# Patient Record
Sex: Male | Born: 1950 | Race: White | Hispanic: No | Marital: Married | Smoking: Former smoker
Health system: Southern US, Community
[De-identification: ages and names within clinical notes are randomized; demographics above are authoritative.]

## PROBLEM LIST (undated history)

## (undated) DIAGNOSIS — E1121 Type 2 diabetes mellitus with diabetic nephropathy: Secondary | ICD-10-CM

## (undated) DIAGNOSIS — M51369 Other intervertebral disc degeneration, lumbar region without mention of lumbar back pain or lower extremity pain: Secondary | ICD-10-CM

## (undated) DIAGNOSIS — R809 Proteinuria, unspecified: Secondary | ICD-10-CM

## (undated) DIAGNOSIS — M5136 Other intervertebral disc degeneration, lumbar region: Secondary | ICD-10-CM

## (undated) DIAGNOSIS — M720 Palmar fascial fibromatosis [Dupuytren]: Secondary | ICD-10-CM

## (undated) DIAGNOSIS — K219 Gastro-esophageal reflux disease without esophagitis: Secondary | ICD-10-CM

## (undated) DIAGNOSIS — E1165 Type 2 diabetes mellitus with hyperglycemia: Secondary | ICD-10-CM

## (undated) DIAGNOSIS — I10 Essential (primary) hypertension: Secondary | ICD-10-CM

## (undated) DIAGNOSIS — E785 Hyperlipidemia, unspecified: Secondary | ICD-10-CM

## (undated) DIAGNOSIS — G4730 Sleep apnea, unspecified: Secondary | ICD-10-CM

## (undated) DIAGNOSIS — J449 Chronic obstructive pulmonary disease, unspecified: Secondary | ICD-10-CM

## (undated) DIAGNOSIS — E669 Obesity, unspecified: Secondary | ICD-10-CM

## (undated) HISTORY — DX: Sleep apnea, unspecified: G47.30

## (undated) HISTORY — DX: Type 2 diabetes mellitus with hyperglycemia: E11.65

## (undated) HISTORY — DX: Hyperlipidemia, unspecified: E78.5

## (undated) HISTORY — DX: Proteinuria, unspecified: R80.9

## (undated) HISTORY — DX: Type 2 diabetes mellitus with diabetic nephropathy: E11.21

## (undated) HISTORY — PX: BACK SURGERY: SHX140

## (undated) HISTORY — PX: NASAL SEPTUM SURGERY: SHX37

## (undated) HISTORY — DX: Gastro-esophageal reflux disease without esophagitis: K21.9

## (undated) HISTORY — DX: Obesity, unspecified: E66.9

## (undated) HISTORY — DX: Essential (primary) hypertension: I10

---

## 2003-08-29 HISTORY — PX: VASECTOMY: SHX75

## 2004-09-01 ENCOUNTER — Ambulatory Visit: Payer: Self-pay | Admitting: Unknown Physician Specialty

## 2005-10-03 ENCOUNTER — Ambulatory Visit: Payer: Self-pay | Admitting: Family Medicine

## 2007-04-26 ENCOUNTER — Ambulatory Visit: Payer: Self-pay | Admitting: Unknown Physician Specialty

## 2009-09-30 ENCOUNTER — Ambulatory Visit: Payer: Self-pay | Admitting: Family Medicine

## 2011-03-06 ENCOUNTER — Ambulatory Visit: Payer: Self-pay

## 2011-03-13 ENCOUNTER — Ambulatory Visit: Payer: Self-pay

## 2014-01-08 DIAGNOSIS — E119 Type 2 diabetes mellitus without complications: Secondary | ICD-10-CM | POA: Insufficient documentation

## 2014-01-08 DIAGNOSIS — E1169 Type 2 diabetes mellitus with other specified complication: Secondary | ICD-10-CM | POA: Insufficient documentation

## 2014-01-08 DIAGNOSIS — E1165 Type 2 diabetes mellitus with hyperglycemia: Secondary | ICD-10-CM

## 2014-01-08 DIAGNOSIS — E1121 Type 2 diabetes mellitus with diabetic nephropathy: Secondary | ICD-10-CM | POA: Insufficient documentation

## 2014-01-08 DIAGNOSIS — E1142 Type 2 diabetes mellitus with diabetic polyneuropathy: Secondary | ICD-10-CM | POA: Insufficient documentation

## 2014-01-08 DIAGNOSIS — IMO0002 Reserved for concepts with insufficient information to code with codable children: Secondary | ICD-10-CM

## 2014-01-08 DIAGNOSIS — Z794 Long term (current) use of insulin: Secondary | ICD-10-CM

## 2014-01-08 DIAGNOSIS — E1122 Type 2 diabetes mellitus with diabetic chronic kidney disease: Secondary | ICD-10-CM | POA: Insufficient documentation

## 2014-01-08 HISTORY — DX: Reserved for concepts with insufficient information to code with codable children: IMO0002

## 2014-01-08 HISTORY — DX: Type 2 diabetes mellitus with hyperglycemia: E11.65

## 2014-03-20 ENCOUNTER — Ambulatory Visit: Payer: Self-pay | Admitting: Unknown Physician Specialty

## 2014-03-23 LAB — PATHOLOGY REPORT

## 2014-04-07 DIAGNOSIS — E785 Hyperlipidemia, unspecified: Secondary | ICD-10-CM

## 2014-04-07 DIAGNOSIS — E1169 Type 2 diabetes mellitus with other specified complication: Secondary | ICD-10-CM | POA: Insufficient documentation

## 2014-04-07 DIAGNOSIS — R809 Proteinuria, unspecified: Secondary | ICD-10-CM | POA: Insufficient documentation

## 2014-04-07 DIAGNOSIS — E669 Obesity, unspecified: Secondary | ICD-10-CM

## 2014-04-07 HISTORY — DX: Obesity, unspecified: E66.9

## 2014-04-07 HISTORY — DX: Hyperlipidemia, unspecified: E78.5

## 2014-04-07 HISTORY — DX: Proteinuria, unspecified: R80.9

## 2014-04-20 DIAGNOSIS — K219 Gastro-esophageal reflux disease without esophagitis: Secondary | ICD-10-CM

## 2014-04-20 DIAGNOSIS — G4730 Sleep apnea, unspecified: Secondary | ICD-10-CM

## 2014-04-20 DIAGNOSIS — I10 Essential (primary) hypertension: Secondary | ICD-10-CM

## 2014-04-20 HISTORY — DX: Sleep apnea, unspecified: G47.30

## 2014-04-20 HISTORY — DX: Essential (primary) hypertension: I10

## 2014-04-20 HISTORY — DX: Gastro-esophageal reflux disease without esophagitis: K21.9

## 2016-10-16 ENCOUNTER — Other Ambulatory Visit: Payer: Self-pay | Admitting: Family Medicine

## 2016-10-16 DIAGNOSIS — J8410 Pulmonary fibrosis, unspecified: Secondary | ICD-10-CM

## 2016-10-20 ENCOUNTER — Ambulatory Visit
Admission: RE | Admit: 2016-10-20 | Discharge: 2016-10-20 | Disposition: A | Discharge status: Home / Self Care | Payer: BLUE CROSS/BLUE SHIELD | Source: Ambulatory Visit | Attending: Family Medicine | Admitting: Family Medicine

## 2016-10-20 DIAGNOSIS — J8410 Pulmonary fibrosis, unspecified: Secondary | ICD-10-CM

## 2016-10-20 DIAGNOSIS — J479 Bronchiectasis, uncomplicated: Secondary | ICD-10-CM | POA: Diagnosis not present

## 2017-05-18 ENCOUNTER — Other Ambulatory Visit: Payer: Self-pay | Admitting: Specialist

## 2017-05-18 DIAGNOSIS — R0602 Shortness of breath: Secondary | ICD-10-CM

## 2017-08-17 ENCOUNTER — Ambulatory Visit: Payer: BLUE CROSS/BLUE SHIELD

## 2017-10-26 ENCOUNTER — Other Ambulatory Visit: Payer: Self-pay | Admitting: Specialist

## 2017-10-26 DIAGNOSIS — J84112 Idiopathic pulmonary fibrosis: Secondary | ICD-10-CM

## 2017-10-26 DIAGNOSIS — R0602 Shortness of breath: Secondary | ICD-10-CM

## 2017-11-19 ENCOUNTER — Encounter: Payer: Self-pay | Admitting: Urology

## 2017-11-19 ENCOUNTER — Ambulatory Visit: Payer: BLUE CROSS/BLUE SHIELD | Admitting: Urology

## 2017-11-19 VITALS — BP 134/74 | HR 101 | Ht 68.0 in | Wt 218.0 lb

## 2017-11-19 DIAGNOSIS — R972 Elevated prostate specific antigen [PSA]: Secondary | ICD-10-CM | POA: Diagnosis not present

## 2017-11-19 LAB — URINALYSIS, COMPLETE
BILIRUBIN UA: NEGATIVE
Leukocytes, UA: NEGATIVE
NITRITE UA: NEGATIVE
Protein, UA: NEGATIVE
RBC, UA: NEGATIVE
Specific Gravity, UA: 1.01 (ref 1.005–1.030)
UUROB: 0.2 mg/dL (ref 0.2–1.0)
pH, UA: 5 (ref 5.0–7.5)

## 2017-11-19 NOTE — Progress Notes (Signed)
11/19/2017 2:03 PM   Stevphen Meuse 1950/09/26 [id number]  Referring provider: Sofie Hartigan, MD [place], [place] [address]  Chief Complaint  Patient presents with  . Elevated PSA    New Patient    HPI: Gustin Zobrist is a 67 year old male seen in consultation at the request of Dr. Ellison Hughs for evaluation of an elevated PSA.  A PSA drawn on 10/19/2017 at the time of an annual physical was elevated at 8.27.  He states his previous PSA prior to that time was around 2004 and was normal.  He has mild lower urinary tract symptoms of urinary frequency and nocturia x2-3.  He notes occasional increased odor to his urine.  There is no previous history of urologic problems or an elevated PSA.  He denies flank, abdominal, pelvic or scrotal pain.  Patient   PMH: Past Medical History:  Diagnosis Date  . Dyslipidemia 04/07/2014  . Esophageal reflux 04/20/2014  . Hypertension 04/20/2014  . Microalbuminuria 04/07/2014  . Obesity, unspecified 04/07/2014  . Sleep apnea 04/20/2014  . 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 BACK SURGERY  1992, 2005  . VASECTOMY  2005    Home Medications:  Allergies as of 11/19/2017   No Known Allergies     Medication List        Accurate as of 11/19/17  2:03 PM. Always use your most recent med list.          ASPIRIN 81 PO Take by mouth.   INVOKAMET [phone] MG Tabs Generic drug:  Canagliflozin-metFORMIN HCl Take 1 tablet by mouth 2 (two) times daily.   losartan 50 MG tablet Commonly known as:  COZAAR TK 1 T PO  QD   NOVOLOG FLEXPEN 100 UNIT/ML FlexPen Generic drug:  insulin aspart   pantoprazole 40 MG tablet Commonly known as:  PROTONIX   pravastatin 10 MG tablet Commonly known as:  PRAVACHOL   VICTOZA 18 MG/3ML Sopn Generic drug:  liraglutide Inject into the skin.       Allergies: No Known Allergies  Family  History: Family History  Problem Relation Age of Onset  . Prostate cancer Neg Hx   . Kidney cancer Neg Hx     Social History:  reports that he has quit smoking. He has never used smokeless tobacco. He reports that he drank alcohol. He reports that he has current or past drug history.  ROS: UROLOGY Frequent Urination?: Yes Hard to postpone urination?: No Burning/pain with urination?: No Get up at night to urinate?: Yes Leakage of urine?: No Urine stream starts and stops?: No Trouble starting stream?: No Do you have to strain to urinate?: No Blood in urine?: No Urinary tract infection?: No Sexually transmitted disease?: No Injury to kidneys or bladder?: No Painful intercourse?: No Weak stream?: No Erection problems?: No Penile pain?: No  Gastrointestinal Nausea?: No Vomiting?: No Indigestion/heartburn?: No Diarrhea?: No Constipation?: No  Constitutional Fever: No Night sweats?: No Weight loss?: No Fatigue?: No  Skin Skin rash/lesions?: No Itching?: No  Eyes Blurred vision?: No Double vision?: No  Ears/Nose/Throat Sore throat?: No Sinus problems?: Yes  Hematologic/Lymphatic Swollen glands?: No Easy bruising?: No  Cardiovascular Leg swelling?: Yes Chest pain?: No  Respiratory Cough?: Yes Shortness of breath?: Yes  Endocrine Excessive thirst?: No  Musculoskeletal Back pain?: No Joint pain?: Yes  Neurological Headaches?: No Dizziness?: No  Psychologic Depression?: No Anxiety?: No  Physical Exam: BP 134/74   Pulse (!) 101   Ht 5\' 8"  (1.727 m)   Wt 218 lb (98.9 kg)   BMI 33.15 kg/m   Constitutional:  Alert and oriented, No acute distress. HEENT: [place] AT, moist mucus membranes.  Trachea midline, no masses. Cardiovascular: No clubbing, cyanosis, or edema. Respiratory: Normal respiratory effort, no increased work of breathing. GI: Abdomen is soft, nontender, nondistended, no abdominal masses GU: No CVA tenderness.  Without lesions, testes  descended bilaterally without masses or tenderness, cord/epididymes palpably normal.  prostate 40 g, smooth without nodules.    Lymph: No cervical or inguinal lymphadenopathy. Skin: No rashes, bruises or suspicious lesions. Neurologic: Grossly intact, no focal deficits, moving all 4 extremities. Psychiatric: Normal mood and affect.  Laboratory Data:  Urinalysis dipstick/microscopy negative  Pertinent Imaging: N/A  Assessment & Plan:    1. Elevated PSA 67 year old male with an elevated PSA and benign DRE.  Potential etiologies were discussed including prostate cancer, BPH and inflammation.  Although PSA is a prostate cancer screening test he was informed that cancer is not the most common cause of an elevated PSA. Other potential causes including BPH and inflammation were discussed. He was informed that the only way to adequately diagnose prostate cancer would be a transrectal ultrasound and biopsy of the prostate. The procedure was discussed including potential risks of bleeding and infection/sepsis. He was also informed that a negative biopsy does not conclusively rule out the possibility that prostate cancer may be present and that continued monitoring is required. The use of newer adjunctive blood tests including PHI and 4kScore were discussed. The use of multiparametric prostate MRI was also discussed however is not typically used for initial evaluation of an elevated PSA. Continued periodic surveillance was also discussed.    His PSA was repeated today and if it remains persistently elevated he would like to think over these options     Abbie Sons, [place] [address] [place], [place] [address] [phone]

## 2017-11-20 LAB — PSA: Prostate Specific Ag, Serum: 9.7 ng/mL — ABNORMAL HIGH (ref 0.0–4.0)

## 2017-11-21 ENCOUNTER — Telehealth: Payer: Self-pay | Admitting: Urology

## 2017-11-21 ENCOUNTER — Telehealth: Payer: Self-pay

## 2017-11-21 NOTE — Telephone Encounter (Signed)
Pt informed, please schedule biopsy.

## 2017-11-21 NOTE — Telephone Encounter (Signed)
Spoke with patient made app and gave instructions and mailed both to patient Dylan Pittman

## 2017-11-29 ENCOUNTER — Other Ambulatory Visit: Payer: Self-pay | Admitting: Urology

## 2017-11-30 ENCOUNTER — Ambulatory Visit: Payer: BLUE CROSS/BLUE SHIELD | Admitting: Urology

## 2017-11-30 ENCOUNTER — Encounter: Payer: Self-pay | Admitting: Urology

## 2017-11-30 VITALS — BP 168/92 | HR 103 | Ht 68.0 in | Wt 216.0 lb

## 2017-11-30 DIAGNOSIS — R972 Elevated prostate specific antigen [PSA]: Secondary | ICD-10-CM

## 2017-11-30 MED ORDER — GENTAMICIN SULFATE 40 MG/ML IJ SOLN
80.0000 mg | Freq: Once | INTRAMUSCULAR | Status: AC
  Administered 2017-11-30: 80 mg via INTRAMUSCULAR

## 2017-11-30 MED ORDER — LEVOFLOXACIN 500 MG PO TABS
500.0000 mg | ORAL_TABLET | Freq: Once | ORAL | Status: AC
  Administered 2017-11-30: 500 mg via ORAL

## 2017-11-30 NOTE — Progress Notes (Signed)
Prostate Biopsy Procedure   Informed consent was obtained after discussing risks/benefits of the procedure.  A time out was performed to ensure correct patient identity.  Pre-Procedure: - Last PSA Level: 9.7 11/19/2017 - Gentamicin given prophylactically - Levaquin 500 mg administered PO -Transrectal Ultrasound performed revealing a 38 gm prostate -No significant hypoechoic or median lobe noted  Procedure: - Prostate block performed using 10 cc 1% lidocaine and biopsies taken from sextant areas, a total of 12 under ultrasound guidance.  Post-Procedure: - Patient tolerated the procedure well - He was counseled to seek immediate medical attention if experiences any severe pain, significant bleeding, or fevers - Return in one week to discuss biopsy results

## 2017-12-06 LAB — PATHOLOGY REPORT

## 2017-12-07 ENCOUNTER — Other Ambulatory Visit: Payer: Self-pay | Admitting: Urology

## 2017-12-10 ENCOUNTER — Telehealth: Payer: Self-pay | Admitting: Family Medicine

## 2017-12-10 NOTE — Telephone Encounter (Signed)
Patient notified and rescheduled appointment.

## 2017-12-10 NOTE — Telephone Encounter (Signed)
-----   Message from Abbie Sons, MD sent at 12/09/2017  9:34 AM EDT ----- May notify patient that his prostate biopsy showed no evidence of cancer.  If he is not having any problems can cancel his results appointment and would recommend a follow-up in 6 months for PSA/DRE.

## 2017-12-17 ENCOUNTER — Ambulatory Visit: Payer: BLUE CROSS/BLUE SHIELD | Admitting: Urology

## 2017-12-27 ENCOUNTER — Encounter: Payer: Self-pay | Admitting: Student

## 2017-12-28 ENCOUNTER — Ambulatory Visit: Payer: BLUE CROSS/BLUE SHIELD | Admitting: Anesthesiology

## 2017-12-28 ENCOUNTER — Encounter
Admission: RE | Disposition: A | Discharge status: Home / Self Care | Payer: Self-pay | Source: Ambulatory Visit | Attending: Unknown Physician Specialty

## 2017-12-28 ENCOUNTER — Ambulatory Visit
Admission: RE | Admit: 2017-12-28 | Discharge: 2017-12-28 | Disposition: A | Discharge status: Home / Self Care | Payer: BLUE CROSS/BLUE SHIELD | Source: Ambulatory Visit | Attending: Unknown Physician Specialty | Admitting: Unknown Physician Specialty

## 2017-12-28 DIAGNOSIS — M5136 Other intervertebral disc degeneration, lumbar region: Secondary | ICD-10-CM | POA: Insufficient documentation

## 2017-12-28 DIAGNOSIS — K219 Gastro-esophageal reflux disease without esophagitis: Secondary | ICD-10-CM | POA: Diagnosis not present

## 2017-12-28 DIAGNOSIS — I10 Essential (primary) hypertension: Secondary | ICD-10-CM | POA: Insufficient documentation

## 2017-12-28 DIAGNOSIS — Z6832 Body mass index (BMI) 32.0-32.9, adult: Secondary | ICD-10-CM | POA: Insufficient documentation

## 2017-12-28 DIAGNOSIS — Z794 Long term (current) use of insulin: Secondary | ICD-10-CM | POA: Diagnosis not present

## 2017-12-28 DIAGNOSIS — E669 Obesity, unspecified: Secondary | ICD-10-CM | POA: Insufficient documentation

## 2017-12-28 DIAGNOSIS — Z79899 Other long term (current) drug therapy: Secondary | ICD-10-CM | POA: Insufficient documentation

## 2017-12-28 DIAGNOSIS — G4730 Sleep apnea, unspecified: Secondary | ICD-10-CM | POA: Diagnosis not present

## 2017-12-28 DIAGNOSIS — E1165 Type 2 diabetes mellitus with hyperglycemia: Secondary | ICD-10-CM | POA: Diagnosis not present

## 2017-12-28 DIAGNOSIS — Z87891 Personal history of nicotine dependence: Secondary | ICD-10-CM | POA: Diagnosis not present

## 2017-12-28 DIAGNOSIS — Z1211 Encounter for screening for malignant neoplasm of colon: Secondary | ICD-10-CM | POA: Diagnosis not present

## 2017-12-28 DIAGNOSIS — K640 First degree hemorrhoids: Secondary | ICD-10-CM | POA: Diagnosis not present

## 2017-12-28 DIAGNOSIS — Z7982 Long term (current) use of aspirin: Secondary | ICD-10-CM | POA: Insufficient documentation

## 2017-12-28 HISTORY — DX: Other intervertebral disc degeneration, lumbar region without mention of lumbar back pain or lower extremity pain: M51.369

## 2017-12-28 HISTORY — DX: Other intervertebral disc degeneration, lumbar region: M51.36

## 2017-12-28 HISTORY — DX: Palmar fascial fibromatosis (dupuytren): M72.0

## 2017-12-28 HISTORY — DX: Chronic obstructive pulmonary disease, unspecified: J44.9

## 2017-12-28 HISTORY — PX: COLONOSCOPY WITH PROPOFOL: SHX5780

## 2017-12-28 LAB — GLUCOSE, CAPILLARY: GLUCOSE-CAPILLARY: 155 mg/dL — AB (ref 65–99)

## 2017-12-28 SURGERY — COLONOSCOPY WITH PROPOFOL
Anesthesia: General

## 2017-12-28 MED ORDER — SODIUM CHLORIDE 0.9 % IV SOLN
INTRAVENOUS | Status: DC
  Administered 2017-12-28: 1000 mL via INTRAVENOUS
  Administered 2017-12-28: 08:00:00 via INTRAVENOUS

## 2017-12-28 MED ORDER — LIDOCAINE HCL (PF) 2 % IJ SOLN
INTRAMUSCULAR | Status: DC | PRN
  Administered 2017-12-28: 100 mg

## 2017-12-28 MED ORDER — MIDAZOLAM HCL 2 MG/2ML IJ SOLN
INTRAMUSCULAR | Status: AC
  Filled 2017-12-28: qty 2

## 2017-12-28 MED ORDER — LIDOCAINE HCL (PF) 1 % IJ SOLN
INTRAMUSCULAR | Status: AC
  Administered 2017-12-28: 0.3 mL via INTRADERMAL
  Filled 2017-12-28: qty 2

## 2017-12-28 MED ORDER — PROPOFOL 500 MG/50ML IV EMUL
INTRAVENOUS | Status: DC | PRN
  Administered 2017-12-28: 75 ug/kg/min via INTRAVENOUS

## 2017-12-28 MED ORDER — PHENYLEPHRINE HCL 10 MG/ML IJ SOLN
INTRAMUSCULAR | Status: AC
  Filled 2017-12-28: qty 1

## 2017-12-28 MED ORDER — MIDAZOLAM HCL 5 MG/5ML IJ SOLN
INTRAMUSCULAR | Status: DC | PRN
  Administered 2017-12-28: 2 mg via INTRAVENOUS

## 2017-12-28 MED ORDER — SODIUM CHLORIDE 0.9 % IV SOLN: INTRAVENOUS | Status: DC

## 2017-12-28 MED ORDER — FENTANYL CITRATE (PF) 100 MCG/2ML IJ SOLN
INTRAMUSCULAR | Status: AC
  Filled 2017-12-28: qty 2

## 2017-12-28 MED ORDER — PROPOFOL 500 MG/50ML IV EMUL
INTRAVENOUS | Status: AC
  Filled 2017-12-28: qty 50

## 2017-12-28 MED ORDER — EPHEDRINE SULFATE 50 MG/ML IJ SOLN
INTRAMUSCULAR | Status: AC
  Filled 2017-12-28: qty 1

## 2017-12-28 MED ORDER — FENTANYL CITRATE (PF) 100 MCG/2ML IJ SOLN
INTRAMUSCULAR | Status: DC | PRN
  Administered 2017-12-28 (×2): 50 ug via INTRAVENOUS

## 2017-12-28 MED ORDER — LIDOCAINE HCL (PF) 1 % IJ SOLN
2.0000 mL | Freq: Once | INTRAMUSCULAR | Status: AC
  Administered 2017-12-28: 0.3 mL via INTRADERMAL

## 2017-12-28 MED ORDER — LIDOCAINE HCL (PF) 2 % IJ SOLN
INTRAMUSCULAR | Status: AC
  Filled 2017-12-28: qty 10

## 2017-12-28 MED ORDER — PROPOFOL 10 MG/ML IV BOLUS
INTRAVENOUS | Status: DC | PRN
  Administered 2017-12-28: 10 mg via INTRAVENOUS
  Administered 2017-12-28: 30 mg via INTRAVENOUS

## 2017-12-28 NOTE — Transfer of Care (Signed)
Immediate Anesthesia Transfer of Care Note  Patient: Dylan Pittman  Procedure(s) Performed: COLONOSCOPY WITH PROPOFOL (N/A )  Patient Location: PACU  Anesthesia Type:General  Level of Consciousness: sedated  Airway & Oxygen Therapy: Patient Spontanous Breathing  Post-op Assessment: Report given to RN and Post -op Vital signs reviewed and stable  Post vital signs: Reviewed and stable  Last Vitals:  Vitals Value Taken Time  BP    Temp    Pulse 84 12/28/2017  8:02 AM  Resp 17 12/28/2017  8:02 AM  SpO2 95 % 12/28/2017  8:02 AM  Vitals shown include unvalidated device data.  Last Pain:  Vitals:   12/28/17 0704  TempSrc: Tympanic  PainSc: 0-No pain         Complications: No apparent anesthesia complications

## 2017-12-28 NOTE — Anesthesia Postprocedure Evaluation (Signed)
Anesthesia Post Note  Patient: Dylan Pittman  Procedure(s) Performed: COLONOSCOPY WITH PROPOFOL (N/A )  Patient location during evaluation: PACU Anesthesia Type: General Level of consciousness: awake and alert Pain management: pain level controlled Vital Signs Assessment: post-procedure vital signs reviewed and stable Respiratory status: spontaneous breathing, nonlabored ventilation, respiratory function stable and patient connected to nasal cannula oxygen Cardiovascular status: blood pressure returned to baseline and stable Postop Assessment: no apparent nausea or vomiting Anesthetic complications: no     Last Vitals:  Vitals:   12/28/17 0822 12/28/17 0832  BP: 120/65 136/85  Pulse: 80 76  Resp: 16 15  Temp:    SpO2: 95% 96%    Last Pain:  Vitals:   12/28/17 0832  TempSrc:   PainSc: 0-No pain                 Molli Barrows

## 2017-12-28 NOTE — Op Note (Signed)
[facility] Gastroenterology Patient Name: Dylan Pittman Procedure Date: 12/28/2017 7:32 AM MRN: [id number] Account #: [id number] Date of Birth: 1951/01/16 Admit Type: Outpatient Age: 67 Room: [facility] ENDO ROOM 3 Gender: Male Note Status: Finalized Procedure:            Colonoscopy Indications:          High risk colon cancer surveillance: Personal history                        of colonic polyps Providers:            Manya Silvas, MD Referring MD:         Sofie Hartigan (Referring MD) Medicines:            Propofol per Anesthesia Complications:        No immediate complications. Procedure:            Pre-Anesthesia Assessment:                       - After reviewing the risks and benefits, the patient                        was deemed in satisfactory condition to undergo the                        procedure.                       After obtaining informed consent, the colonoscope was                        passed under direct vision. Throughout the procedure,                        the patient's blood pressure, pulse, and oxygen                        saturations were monitored continuously. The                        Colonoscope was introduced through the anus and                        advanced to the the cecum, identified by appendiceal                        orifice and ileocecal valve. The colonoscopy was                        performed without difficulty. The patient tolerated the                        procedure well. The quality of the bowel preparation                        was adequate to identify polyps. Findings:      Internal hemorrhoids were found during endoscopy. The hemorrhoids were       small and Grade I (internal hemorrhoids that do not prolapse).      The exam was otherwise without abnormality. Impression:           -  Internal hemorrhoids.                       - The examination was otherwise normal.                       - No  specimens collected. Recommendation:       - Repeat colonoscopy in 5 years for surveillance. Manya Silvas, MD 12/28/2017 8:03:15 AM This report has been signed electronically. Number of Addenda: 0 Note Initiated On: 12/28/2017 7:32 AM Scope Withdrawal Time: 0 hours 13 minutes 33 seconds  Total Procedure Duration: 0 hours 17 minutes 53 seconds       [facility]

## 2017-12-28 NOTE — H&P (Signed)
Primary Care Physician:  Sofie Hartigan, MD Primary Gastroenterologist:  Dr. Vira Agar  Pre-Procedure History & Physical: HPI:  Dylan Pittman is a 67 y.o. male is here for an colonoscopy.  For colon cancer in father.   Past Medical History:  Diagnosis Date  . Chronic airway obstruction ([place])   . DDD (degenerative disc disease), lumbar   . Dupuytren's disease   . Dyslipidemia 04/07/2014  . Esophageal reflux 04/20/2014  . Hypertension 04/20/2014  . Microalbuminuria 04/07/2014  . Microalbuminuria   . Obesity, unspecified 04/07/2014  . Sleep apnea 04/20/2014  . 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 BACK SURGERY  1992, 2005  . NASAL SEPTUM SURGERY    . VASECTOMY  2005    Prior to Admission medications   Medication Sig Start Date End Date Taking? Authorizing Provider  omeprazole (PRILOSEC) 20 MG capsule Take 20 mg by mouth daily.   Yes [provider]  ASPIRIN 81 PO Take by mouth. 12/15/09   [provider]  INVOKAMET [phone] MG TABS Take 1 tablet by mouth 2 (two) times daily. 11/05/17   [provider]  liraglutide (VICTOZA) 18 MG/3ML SOPN Inject into the skin. 10/19/17   [provider]  losartan (COZAAR) 50 MG tablet TK 1 T PO  QD 10/07/17   [provider]  NOVOLOG FLEXPEN 100 UNIT/ML FlexPen  11/18/17   [provider]  pantoprazole (PROTONIX) 40 MG tablet  10/19/17   [provider]  pravastatin (PRAVACHOL) 10 MG tablet  11/18/17   [provider]    Allergies as of 12/19/2017  . (No Known Allergies)    Family History  Problem Relation Age of Onset  . Prostate cancer Neg Hx   . Kidney cancer Neg Hx     Social History   Socioeconomic History  . Marital status: Married    Spouse name: Not on file  . Number of children: Not on file  . Years of education: Not on file  . Highest education level: Not on file   Occupational History  . Not on file  Social Needs  . Financial resource strain: Not on file  . Food insecurity:    Worry: Not on file    Inability: Not on file  . Transportation needs:    Medical: Not on file    Non-medical: Not on file  Tobacco Use  . Smoking status: Former Research scientist (life sciences)  . Smokeless tobacco: Never Used  Substance and Sexual Activity  . Alcohol use: Not Currently  . Drug use: Not Currently  . Sexual activity: Not on file  Lifestyle  . Physical activity:    Days per week: Not on file    Minutes per session: Not on file  . Stress: Not on file  Relationships  . Social connections:    Talks on phone: Not on file    Gets together: Not on file    Attends religious service: Not on file    Active member of club or organization: Not on file    Attends meetings of clubs or organizations: Not on file    Relationship status: Not on file  . Intimate partner violence:    Fear of current or ex partner: Not on file    Emotionally abused: Not on file    Physically abused: Not on file    Forced sexual activity: Not on file  Other Topics Concern  .  Not on file  Social History Narrative  . Not on file    Review of Systems: See HPI, otherwise negative ROS  Physical Exam: BP (!) 141/76   Pulse 90   Temp (!) 97 F (36.1 C) (Tympanic)   Resp 17   Ht 5\' 8"  (1.727 m)   Wt 95.7 kg (211 lb)   SpO2 96%   BMI 32.08 kg/m  General:   Alert,  pleasant and cooperative in NAD Head:  Normocephalic and atraumatic. Neck:  Supple; no masses or thyromegaly. Lungs:  Clear throughout to auscultation.    Heart:  Regular rate and rhythm. Abdomen:  Soft, nontender and nondistended. Normal bowel sounds, without guarding, and without rebound.   Neurologic:  Alert and  oriented x4;  grossly normal neurologically.  Impression/Plan: Dylan Pittman is here for an colonoscopy to be performed for FH colon cancer in father.  Risks, benefits, limitations, and alternatives regarding   colonoscopy have been reviewed with the patient.  Questions have been answered.  All parties agreeable.   Gaylyn Cheers, MD  12/28/2017, 7:36 AM

## 2017-12-28 NOTE — Anesthesia Preprocedure Evaluation (Signed)
Anesthesia Evaluation  Patient identified by MRN, date of birth, ID band Patient awake    Reviewed: Allergy & Precautions, H&P , NPO status , Patient's Chart, lab work & pertinent test results, reviewed documented beta blocker date and time   Airway Mallampati: II   Neck ROM: full    Dental  (+) Poor Dentition   Pulmonary neg pulmonary ROS, sleep apnea , COPD, former smoker,    Pulmonary exam normal        Cardiovascular hypertension, On Medications negative cardio ROS Normal cardiovascular exam Rhythm:regular Rate:Normal     Neuro/Psych negative neurological ROS  negative psych ROS   GI/Hepatic negative GI ROS, Neg liver ROS, GERD  ,  Endo/Other  negative endocrine ROSdiabetes  Renal/GU Renal diseasenegative Renal ROS  negative genitourinary   Musculoskeletal   Abdominal   Peds  Hematology negative hematology ROS (+)   Anesthesia Other Findings Past Medical History: No date: Chronic airway obstruction ([facility]) No date: DDD (degenerative disc disease), lumbar No date: Dupuytren's disease 04/07/2014: Dyslipidemia 04/20/2014: Esophageal reflux 04/20/2014: Hypertension 04/07/2014: Microalbuminuria No date: Microalbuminuria 04/07/2014: Obesity, unspecified 04/20/2014: Sleep apnea 01/08/2014: 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  ([place]) Past Surgical History: 1992, 2005: BACK SURGERY No date: NASAL SEPTUM SURGERY 2005: VASECTOMY BMI    Body Mass Index:  32.08 kg/m     Reproductive/Obstetrics negative OB ROS                             Anesthesia Physical Anesthesia Plan  ASA: III  Anesthesia Plan: General   Post-op Pain Management:    Induction:   PONV Risk Score and Plan:   Airway Management Planned:   Additional Equipment:   Intra-op Plan:   Post-operative Plan:   Informed Consent: I have reviewed the patients History and Physical, chart, labs and  discussed the procedure including the risks, benefits and alternatives for the proposed anesthesia with the patient or authorized representative who has indicated his/her understanding and acceptance.   Dental Advisory Given  Plan Discussed with: CRNA  Anesthesia Plan Comments:         Anesthesia Quick Evaluation

## 2017-12-28 NOTE — Anesthesia Post-op Follow-up Note (Signed)
Anesthesia QCDR form completed.        

## 2017-12-31 ENCOUNTER — Encounter: Payer: Self-pay | Admitting: Unknown Physician Specialty

## 2018-01-18 DIAGNOSIS — E559 Vitamin D deficiency, unspecified: Secondary | ICD-10-CM | POA: Insufficient documentation

## 2018-06-05 ENCOUNTER — Other Ambulatory Visit: Payer: Self-pay | Admitting: Family Medicine

## 2018-06-05 DIAGNOSIS — R972 Elevated prostate specific antigen [PSA]: Secondary | ICD-10-CM

## 2018-06-07 ENCOUNTER — Other Ambulatory Visit: Payer: BLUE CROSS/BLUE SHIELD

## 2018-06-07 DIAGNOSIS — R972 Elevated prostate specific antigen [PSA]: Secondary | ICD-10-CM

## 2018-06-08 LAB — PSA: Prostate Specific Ag, Serum: 9 ng/mL — ABNORMAL HIGH (ref 0.0–4.0)

## 2018-06-12 ENCOUNTER — Ambulatory Visit: Payer: Self-pay | Admitting: Urology

## 2018-06-18 ENCOUNTER — Encounter: Payer: Self-pay | Admitting: Urology

## 2018-06-18 ENCOUNTER — Ambulatory Visit: Payer: BLUE CROSS/BLUE SHIELD | Admitting: Urology

## 2018-06-18 VITALS — BP 121/73 | HR 88 | Ht 68.0 in | Wt 216.2 lb

## 2018-06-18 DIAGNOSIS — R972 Elevated prostate specific antigen [PSA]: Secondary | ICD-10-CM | POA: Insufficient documentation

## 2018-06-18 NOTE — Progress Notes (Signed)
06/18/2018 6:27 PM   Dylan Pittman [birth year] [id number]  Referring provider: Sofie Hartigan, MD [place], [place] [address]  Chief Complaint  Patient presents with  . Elevated PSA   Urologic history: 1.  Elevated PSA -TRUS/biopsy April 2019; PSA 9.7; 38 g prostate -Pathology benign prostate tissue with multiple core showing focal chronic inflammation  HPI: 67 year old male presents for follow-up of an elevated PSA.  He denies bothersome lower urinary tract symptoms; hematuria or flank/abdominal/pelvic pain.  PSA drawn on 06/07/2018 stable at 9.0   PMH: Past Medical History:  Diagnosis Date  . Chronic airway obstruction ([place])   . DDD (degenerative disc disease), lumbar   . Dupuytren's disease   . Dyslipidemia 04/07/2014  . Esophageal reflux 04/20/2014  . Hypertension 04/20/2014  . Microalbuminuria 04/07/2014  . Microalbuminuria   . Obesity, unspecified 04/07/2014  . Sleep apnea 04/20/2014  . 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 BACK SURGERY  1992, 2005  . COLONOSCOPY WITH PROPOFOL N/A 12/28/2017   Procedure: COLONOSCOPY WITH PROPOFOL;  Surgeon: Manya Silvas, MD;  Location[facility] ENDOSCOPY;  Service: Endoscopy;  Laterality: N/A;  . NASAL SEPTUM SURGERY    . VASECTOMY  2005    Home Medications:  Allergies as of 06/18/2018      Reactions   Pravastatin Other (See Comments)      Medication List        Accurate as of 06/18/18  6:27 PM. Always use your most recent med list.          ASPIRIN 81 PO Take by mouth.   BD PEN NEEDLE NANO U/F 32G X 4 MM Misc Generic drug:  Insulin Pen Needle U QID   GLUCAGON EMERGENCY 1 MG injection Generic drug:  glucagon use as directed by prescriber   glucose blood test strip Use 1 strip via meter three times daily as directed  ONE TOUCH ULTRA E11.65   INVOKAMET [phone] MG Tabs Generic drug:   Canagliflozin-metFORMIN HCl Take 1 tablet by mouth 2 (two) times daily.   losartan 50 MG tablet Commonly known as:  COZAAR TK 1 T PO  QD   NOVOLOG FLEXPEN 100 UNIT/ML FlexPen Generic drug:  insulin aspart   omeprazole 20 MG capsule Commonly known as:  PRILOSEC Take 20 mg by mouth daily.   ONE TOUCH ULTRA MINI w/Device Kit Use as directed   OZEMPIC (0.25 OR 0.5 MG/DOSE) 2 MG/1.5ML Sopn Generic drug:  Semaglutide(0.25 or 0.5MG/DOS) INJECT 0.5 MG [place] Q 7 DAYS   pantoprazole 40 MG tablet Commonly known as:  PROTONIX   pravastatin 10 MG tablet Commonly known as:  PRAVACHOL   rosuvastatin 5 MG tablet Commonly known as:  CRESTOR   VICTOZA 18 MG/3ML Sopn Generic drug:  liraglutide Inject into the skin.       Allergies:  Allergies  Allergen Reactions  . Pravastatin Other (See Comments)    Family History: Family History  Problem Relation Age of Onset  . Prostate cancer Neg Hx   . Kidney cancer Neg Hx     Social History:  reports that he has quit smoking. He has never used smokeless tobacco. He reports that he drank alcohol. He reports that he has current or past drug history.  ROS: UROLOGY Frequent Urination?: Yes Hard to postpone urination?: No Burning/pain with urination?: No Get up at night to urinate?: Yes Leakage of urine?: No  Urine stream starts and stops?: No Trouble starting stream?: No Do you have to strain to urinate?: No Blood in urine?: No Urinary tract infection?: No Sexually transmitted disease?: No Injury to kidneys or bladder?: No Painful intercourse?: No Weak stream?: No Erection problems?: No Penile pain?: No  Gastrointestinal Nausea?: No Vomiting?: No Indigestion/heartburn?: No Diarrhea?: Yes Constipation?: No  Constitutional Fever: No Night sweats?: No Weight loss?: No Fatigue?: No  Skin Skin rash/lesions?: No Itching?: No  Eyes Blurred vision?: No Double vision?: No  Ears/Nose/Throat Sore throat?: No Sinus  problems?: No  Hematologic/Lymphatic Swollen glands?: No Easy bruising?: No  Cardiovascular Leg swelling?: No Chest pain?: No  Respiratory Cough?: No Shortness of breath?: Yes  Endocrine Excessive thirst?: No  Musculoskeletal Back pain?: No Joint pain?: Yes  Neurological Headaches?: No Dizziness?: No  Psychologic Depression?: No Anxiety?: No  Physical Exam: BP 121/73 (BP Location: Left Arm, Patient Position: Sitting, Cuff Size: Large)   Pulse 88   Ht 5' 8" (1.727 m)   Wt 216 lb 3.2 oz (98.1 kg)   BMI 32.87 kg/m   Constitutional:  Alert and oriented, No acute distress. HEENT: [place] AT, moist mucus membranes.  Trachea midline, no masses. Cardiovascular: No clubbing, cyanosis, or edema. Respiratory: Normal respiratory effort, no increased work of breathing. GI: Abdomen is soft, nontender, nondistended, no abdominal masses GU: No CVA tenderness.  Prostate 40 g, smooth without nodules Lymph: No cervical or inguinal lymphadenopathy. Skin: No rashes, bruises or suspicious lesions. Neurologic: Grossly intact, no focal deficits, moving all 4 extremities. Psychiatric: Normal mood and affect.   Assessment & Plan:   67 year old male with an elevated PSA and previous biopsy showing benign prostate tissue/focal chronic inflammation.  DRE is benign and PSA stable at 9.0.  Recommend a follow-up PSA/DRE in 6 months.  Return in about 6 months (around 12/18/2018) for Recheck, PSA.   Abbie Sons, [place] [address] [place], [place] [address] [phone]

## 2018-12-11 ENCOUNTER — Other Ambulatory Visit: Payer: Self-pay

## 2018-12-11 DIAGNOSIS — R972 Elevated prostate specific antigen [PSA]: Secondary | ICD-10-CM

## 2018-12-12 ENCOUNTER — Other Ambulatory Visit: Payer: BLUE CROSS/BLUE SHIELD

## 2018-12-12 ENCOUNTER — Other Ambulatory Visit: Payer: Self-pay

## 2018-12-12 DIAGNOSIS — R972 Elevated prostate specific antigen [PSA]: Secondary | ICD-10-CM

## 2018-12-13 ENCOUNTER — Other Ambulatory Visit: Payer: BLUE CROSS/BLUE SHIELD

## 2018-12-13 LAB — PSA: Prostate Specific Ag, Serum: 10.2 ng/mL — ABNORMAL HIGH (ref 0.0–4.0)

## 2018-12-19 ENCOUNTER — Other Ambulatory Visit: Payer: Self-pay

## 2018-12-19 ENCOUNTER — Telehealth (INDEPENDENT_AMBULATORY_CARE_PROVIDER_SITE_OTHER): Payer: BLUE CROSS/BLUE SHIELD | Admitting: Urology

## 2018-12-19 DIAGNOSIS — R972 Elevated prostate specific antigen [PSA]: Secondary | ICD-10-CM

## 2018-12-19 NOTE — Progress Notes (Signed)
Virtual Visit via Telephone Note  I connected with Dylan Pittman on 12/19/18 at  9:30 AM EDT by telephone and verified that I am speaking with the correct person using two identifiers.   I discussed the limitations, risks, security and privacy concerns of performing an evaluation and management service by telephone and the availability of in person appointments. We discussed the impact of the COVID-19 on the healthcare system, and the importance of social distancing and reducing patient and provider exposure. I also discussed with the patient that there may be a patient responsible charge related to this service. The patient expressed understanding and agreed to proceed.  Reason for visit: Telephone visit due to COVID-19 pandemic.  He did not have the capability of a video visit.  History of Present Illness: 68 year old male with an elevated PSA.  Prostate biopsy was performed April 2019 for a PSA of 9.7 with pathology showing benign prostate tissue and multiple cores with chronic inflammation.  He was last seen in October 2019.  He states he is doing well and denies bothersome lower urinary tract symptoms.  Denies dysuria, gross hematuria or flank/abdominal/pelvic/scrotal pain.  A PSA drawn on 12/12/2018 was slightly above baseline at 10.2.  Assessment and Plan: 68 year old male with an elevated PSA and previous benign prostate biopsy.  His most recent PSA is slightly above baseline  Follow Up: I recommended a 69-month follow-up for PSA/DRE.  Prostate MRI if his PSA at next visit is higher.   I discussed the assessment and treatment plan with the patient. The patient was provided an opportunity to ask questions and all were answered. The patient agreed with the plan and demonstrated an understanding of the instructions.   The patient was advised to call back or seek an in-person evaluation if the symptoms worsen or if the condition fails to improve as anticipated.  I provided 5 minutes of  non-face-to-face time during this encounter.   Abbie Sons, MD

## 2018-12-20 ENCOUNTER — Ambulatory Visit: Payer: BLUE CROSS/BLUE SHIELD | Admitting: Urology

## 2019-06-13 ENCOUNTER — Other Ambulatory Visit: Payer: Self-pay

## 2019-06-13 ENCOUNTER — Other Ambulatory Visit: Payer: BC Managed Care – PPO

## 2019-06-13 DIAGNOSIS — R972 Elevated prostate specific antigen [PSA]: Secondary | ICD-10-CM

## 2019-06-14 LAB — PSA: Prostate Specific Ag, Serum: 9.6 ng/mL — ABNORMAL HIGH (ref 0.0–4.0)

## 2019-06-20 ENCOUNTER — Ambulatory Visit: Payer: BLUE CROSS/BLUE SHIELD | Admitting: Urology

## 2019-07-04 ENCOUNTER — Ambulatory Visit: Payer: BC Managed Care – PPO | Admitting: Urology

## 2019-07-31 ENCOUNTER — Other Ambulatory Visit: Payer: Self-pay

## 2019-07-31 ENCOUNTER — Encounter: Payer: Self-pay | Admitting: Urology

## 2019-07-31 ENCOUNTER — Ambulatory Visit: Payer: BC Managed Care – PPO | Admitting: Urology

## 2019-07-31 VITALS — BP 143/76 | HR 101 | Ht 68.0 in | Wt 227.0 lb

## 2019-07-31 DIAGNOSIS — R972 Elevated prostate specific antigen [PSA]: Secondary | ICD-10-CM

## 2019-07-31 DIAGNOSIS — N401 Enlarged prostate with lower urinary tract symptoms: Secondary | ICD-10-CM | POA: Diagnosis not present

## 2019-07-31 DIAGNOSIS — N400 Enlarged prostate without lower urinary tract symptoms: Secondary | ICD-10-CM | POA: Insufficient documentation

## 2019-07-31 NOTE — Progress Notes (Signed)
07/31/2019 3:05 PM   Dylan Pittman 1951/07/04 [id number]  Referring provider: Sofie Hartigan, MD [place],  [place] [address]  Chief Complaint  Patient presents with  . Follow-up    Urologic history: 1.  Elevated PSA -TRUS/biopsy April 2019; PSA 9.7; 38 g prostate -Pathology benign prostate tissue with multiple cores showing focal chronic inflammation   HPI: 68 y.o. male presents for follow-up of an elevated PSA.  He has mild lower urinary tract symptoms of frequency, postvoid dribbling and nocturia x3.  He attributes the majority of his symptoms to increase fluid intake.  They are presently not bothersome.  PSA in April had bumped to 10.2.  Repeat PSA October 2020 was back to baseline at 9.6.   PMH: Past Medical History:  Diagnosis Date  . Chronic airway obstruction ([place])   . DDD (degenerative disc disease), lumbar   . Dupuytren's disease   . Dyslipidemia 04/07/2014  . Esophageal reflux 04/20/2014  . Hypertension 04/20/2014  . Microalbuminuria 04/07/2014  . Microalbuminuria   . Obesity, unspecified 04/07/2014  . Sleep apnea 04/20/2014  . 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 BACK SURGERY  1992, 2005  . COLONOSCOPY WITH PROPOFOL N/A 12/28/2017   Procedure: COLONOSCOPY WITH PROPOFOL;  Surgeon: Manya Silvas, MD;  Location: [facility] ENDOSCOPY;  Service: Endoscopy;  Laterality: N/A;  . NASAL SEPTUM SURGERY    . VASECTOMY  2005    Home Medications:  Allergies as of 07/31/2019      Reactions   Pravastatin Other (See Comments)      Medication List       Accurate as of July 31, 2019  3:05 PM. If you have any questions, ask your nurse or doctor.        STOP taking these medications   NovoLOG FlexPen 100 UNIT/ML FlexPen Generic drug: insulin aspart Stopped by: Abbie Sons, MD   omeprazole 20 MG capsule Commonly known as: PRILOSEC  Stopped by: Abbie Sons, MD   Victoza 18 MG/3ML Sopn Generic drug: liraglutide Stopped by: Abbie Sons, MD     TAKE these medications   ASPIRIN 81 PO Take by mouth.   Aspirin Buf(CaCarb-MgCarb-MgO) 81 MG Tabs Take by mouth.   BD Pen Needle Nano U/F 32G X 4 MM Misc Generic drug: Insulin Pen Needle U QID   Glucagon Emergency 1 MG injection Generic drug: glucagon use as directed by prescriber   glucose blood test strip Use 1 strip via meter three times daily as directed  ONE TOUCH ULTRA E11.65   Invokamet XR [phone] MG Tb24 Generic drug: Canagliflozin-metFORMIN HCl ER Take 2 tablets by mouth daily. What changed: Another medication with the same name was removed. Continue taking this medication, and follow the directions you see here. Changed by: Abbie Sons, MD   losartan 50 MG tablet Commonly known as: COZAAR TK 1 T PO  QD   Ozempic (1 MG/DOSE) 2 MG/1.5ML Sopn Generic drug: Semaglutide (1 MG/DOSE) What changed: Another medication with the same name was removed. Continue taking this medication, and follow the directions you see here. Changed by: Abbie Sons, MD   pantoprazole 40 MG tablet Commonly known as: PROTONIX   pioglitazone 30 MG tablet Commonly known as: ACTOS Take 30 mg by mouth daily.   pravastatin 10 MG tablet Commonly known as: PRAVACHOL   rosuvastatin 5 MG tablet Commonly known as:  CRESTOR       Allergies:  Allergies  Allergen Reactions  . Pravastatin Other (See Comments)    Family History: Family History  Problem Relation Age of Onset  . Prostate cancer Neg Hx   . Kidney cancer Neg Hx     Social History:  reports that he has quit smoking. He has never used smokeless tobacco. He reports previous alcohol use. He reports previous drug use.  ROS: UROLOGY Frequent Urination?: Yes Hard to postpone urination?: No Burning/pain with urination?: No Get up at night to urinate?: Yes Leakage of urine?: No Urine stream  starts and stops?: No Trouble starting stream?: No Do you have to strain to urinate?: No Blood in urine?: No Urinary tract infection?: No Sexually transmitted disease?: No Injury to kidneys or bladder?: No Painful intercourse?: No Weak stream?: No Erection problems?: No Penile pain?: No  Gastrointestinal Nausea?: No Vomiting?: No Indigestion/heartburn?: No Diarrhea?: No Constipation?: No  Constitutional Fever: No Night sweats?: No Weight loss?: No Fatigue?: No  Skin Skin rash/lesions?: No Itching?: No  Eyes Blurred vision?: No Double vision?: No  Ears/Nose/Throat Sore throat?: No Sinus problems?: No  Hematologic/Lymphatic Swollen glands?: No Easy bruising?: No  Cardiovascular Leg swelling?: No Chest pain?: No  Respiratory Cough?: No Shortness of breath?: No  Endocrine Excessive thirst?: No  Musculoskeletal Back pain?: No Joint pain?: No  Neurological Headaches?: No Dizziness?: No  Psychologic Depression?: No Anxiety?: No  Physical Exam: BP (!) 143/76 (BP Location: Left Arm, Patient Position: Sitting, Cuff Size: Normal)   Pulse (!) 101   Ht 5\' 8"  (1.727 m)   Wt 227 lb (103 kg)   BMI 34.52 kg/m   Constitutional:  Alert and oriented, No acute distress. HEENT: [place] AT, moist mucus membranes.  Trachea midline, no masses. Cardiovascular: No clubbing, cyanosis, or edema. Respiratory: Normal respiratory effort, no increased work of breathing. GI: Abdomen is soft, nontender, nondistended, no abdominal masses GU: Prostate 40 g, smooth without nodules Lymph: No cervical or inguinal lymphadenopathy. Skin: No rashes, bruises or suspicious lesions. Neurologic: Grossly intact, no focal deficits, moving all 4 extremities. Psychiatric: Normal mood and affect.   Assessment & Plan:    - Elevated PSA Stable PSA with benign DRE.  Follow-up annually.  - BPH with lower urinary tract symptoms Mild voiding symptoms which are not bothersome.   Abbie Sons, [place] [address], [place], [place] [address] [phone]

## 2019-09-08 ENCOUNTER — Other Ambulatory Visit: Payer: BLUE CROSS/BLUE SHIELD

## 2019-09-08 ENCOUNTER — Ambulatory Visit: Payer: BC Managed Care – PPO | Attending: Internal Medicine

## 2019-09-08 DIAGNOSIS — Z20822 Contact with and (suspected) exposure to covid-19: Secondary | ICD-10-CM

## 2019-09-09 LAB — NOVEL CORONAVIRUS, NAA: SARS-CoV-2, NAA: NOT DETECTED

## 2020-02-06 ENCOUNTER — Emergency Department: Payer: BC Managed Care – PPO

## 2020-02-06 ENCOUNTER — Encounter: Payer: Self-pay | Admitting: Emergency Medicine

## 2020-02-06 ENCOUNTER — Other Ambulatory Visit: Payer: Self-pay

## 2020-02-06 ENCOUNTER — Emergency Department
Admission: EM | Admit: 2020-02-06 | Discharge: 2020-02-07 | Disposition: A | Discharge status: Home / Self Care | Payer: BC Managed Care – PPO | Attending: Emergency Medicine | Admitting: Emergency Medicine

## 2020-02-06 DIAGNOSIS — S62512A Displaced fracture of proximal phalanx of left thumb, initial encounter for closed fracture: Secondary | ICD-10-CM | POA: Diagnosis not present

## 2020-02-06 DIAGNOSIS — S62613A Displaced fracture of proximal phalanx of left middle finger, initial encounter for closed fracture: Secondary | ICD-10-CM | POA: Diagnosis not present

## 2020-02-06 DIAGNOSIS — Y9389 Activity, other specified: Secondary | ICD-10-CM | POA: Insufficient documentation

## 2020-02-06 DIAGNOSIS — Y999 Unspecified external cause status: Secondary | ICD-10-CM | POA: Insufficient documentation

## 2020-02-06 DIAGNOSIS — W378XXA Explosion and rupture of other pressurized tire, pipe or hose, initial encounter: Secondary | ICD-10-CM | POA: Diagnosis not present

## 2020-02-06 DIAGNOSIS — E119 Type 2 diabetes mellitus without complications: Secondary | ICD-10-CM | POA: Diagnosis not present

## 2020-02-06 DIAGNOSIS — S61402A Unspecified open wound of left hand, initial encounter: Secondary | ICD-10-CM | POA: Diagnosis not present

## 2020-02-06 DIAGNOSIS — Z87891 Personal history of nicotine dependence: Secondary | ICD-10-CM | POA: Diagnosis not present

## 2020-02-06 DIAGNOSIS — S61215A Laceration without foreign body of left ring finger without damage to nail, initial encounter: Secondary | ICD-10-CM | POA: Diagnosis not present

## 2020-02-06 DIAGNOSIS — S6992XA Unspecified injury of left wrist, hand and finger(s), initial encounter: Secondary | ICD-10-CM | POA: Diagnosis present

## 2020-02-06 DIAGNOSIS — Y929 Unspecified place or not applicable: Secondary | ICD-10-CM | POA: Insufficient documentation

## 2020-02-06 DIAGNOSIS — I10 Essential (primary) hypertension: Secondary | ICD-10-CM | POA: Diagnosis not present

## 2020-02-06 DIAGNOSIS — Z7984 Long term (current) use of oral hypoglycemic drugs: Secondary | ICD-10-CM | POA: Diagnosis not present

## 2020-02-06 DIAGNOSIS — S61212A Laceration without foreign body of right middle finger without damage to nail, initial encounter: Secondary | ICD-10-CM | POA: Diagnosis not present

## 2020-02-06 DIAGNOSIS — Z7982 Long term (current) use of aspirin: Secondary | ICD-10-CM | POA: Insufficient documentation

## 2020-02-06 DIAGNOSIS — Z79899 Other long term (current) drug therapy: Secondary | ICD-10-CM | POA: Insufficient documentation

## 2020-02-06 DIAGNOSIS — S61213A Laceration without foreign body of left middle finger without damage to nail, initial encounter: Secondary | ICD-10-CM | POA: Insufficient documentation

## 2020-02-06 MED ORDER — ACETAMINOPHEN 325 MG PO TABS
ORAL_TABLET | ORAL | Status: AC
  Filled 2020-02-06: qty 2

## 2020-02-06 MED ORDER — LIDOCAINE HCL (PF) 1 % IJ SOLN
10.0000 mL | Freq: Once | INTRAMUSCULAR | Status: AC
  Administered 2020-02-06: 10 mL via INTRADERMAL
  Filled 2020-02-06: qty 10

## 2020-02-06 MED ORDER — BUPIVACAINE HCL (PF) 0.5 % IJ SOLN
30.0000 mL | Freq: Once | INTRAMUSCULAR | Status: AC
  Administered 2020-02-06: 10 mL
  Filled 2020-02-06: qty 30

## 2020-02-06 MED ORDER — HYDROMORPHONE HCL 1 MG/ML IJ SOLN
0.5000 mg | Freq: Once | INTRAMUSCULAR | Status: AC
  Administered 2020-02-06: 0.5 mg via INTRAMUSCULAR
  Filled 2020-02-06: qty 1

## 2020-02-06 MED ORDER — SULFAMETHOXAZOLE-TRIMETHOPRIM 800-160 MG PO TABS: 1.0000 | ORAL_TABLET | Freq: Two times a day (BID) | ORAL | 0 refills | Status: DC

## 2020-02-06 MED ORDER — BACITRACIN-NEOMYCIN-POLYMYXIN 400-5-5000 EX OINT
TOPICAL_OINTMENT | Freq: Once | CUTANEOUS | Status: AC
  Administered 2020-02-06: 1 via TOPICAL
  Filled 2020-02-06: qty 1

## 2020-02-06 MED ORDER — CEPHALEXIN 500 MG PO CAPS
500.0000 mg | ORAL_CAPSULE | Freq: Two times a day (BID) | ORAL | 0 refills | Status: AC
Start: 2020-02-06 — End: 2020-02-13

## 2020-02-06 MED ORDER — ACETAMINOPHEN 325 MG PO TABS
650.0000 mg | ORAL_TABLET | Freq: Once | ORAL | Status: AC
  Administered 2020-02-06: 650 mg via ORAL

## 2020-02-06 MED ORDER — HYDROCODONE-ACETAMINOPHEN 5-325 MG PO TABS: 1.0000 | ORAL_TABLET | Freq: Four times a day (QID) | ORAL | 0 refills | Status: AC | PRN

## 2020-02-06 NOTE — ED Notes (Signed)
Patient's left hand was placed in a betadine/NS solution mixed by Sarita Haver.
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
## 2020-02-06 NOTE — Discharge Instructions (Signed)
Call Dr. Theodore Demark office to schedule a follow up appointment.  Take the antibiotics as prescribed and until finished.  Take the pain medication as needed and as directed.  Return to the ER for pain not well controlled by meds or for any concern for infection.
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
## 2020-02-10 ENCOUNTER — Other Ambulatory Visit: Payer: Self-pay | Admitting: Orthopedic Surgery

## 2020-02-10 ENCOUNTER — Other Ambulatory Visit: Payer: Self-pay

## 2020-02-10 ENCOUNTER — Other Ambulatory Visit
Admission: RE | Admit: 2020-02-10 | Discharge: 2020-02-10 | Disposition: A | Discharge status: Home / Self Care | Payer: BC Managed Care – PPO | Source: Ambulatory Visit | Attending: Orthopedic Surgery | Admitting: Orthopedic Surgery

## 2020-02-10 DIAGNOSIS — Z01812 Encounter for preprocedural laboratory examination: Secondary | ICD-10-CM | POA: Insufficient documentation

## 2020-02-10 DIAGNOSIS — Z20822 Contact with and (suspected) exposure to covid-19: Secondary | ICD-10-CM | POA: Diagnosis not present

## 2020-02-11 LAB — SARS CORONAVIRUS 2 (TAT 6-24 HRS): SARS Coronavirus 2: NEGATIVE
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
## 2020-02-12 ENCOUNTER — Encounter
Admission: RE | Disposition: A | Discharge status: Home / Self Care | Payer: Self-pay | Source: Home / Self Care | Attending: Orthopedic Surgery

## 2020-02-12 ENCOUNTER — Ambulatory Visit: Payer: BC Managed Care – PPO

## 2020-02-12 ENCOUNTER — Ambulatory Visit
Admission: RE | Admit: 2020-02-12 | Discharge: 2020-02-12 | Disposition: A | Discharge status: Home / Self Care | Payer: BC Managed Care – PPO | Attending: Orthopedic Surgery | Admitting: Orthopedic Surgery

## 2020-02-12 ENCOUNTER — Encounter: Payer: Self-pay | Admitting: Orthopedic Surgery

## 2020-02-12 ENCOUNTER — Ambulatory Visit: Payer: BC Managed Care – PPO | Admitting: Anesthesiology

## 2020-02-12 ENCOUNTER — Other Ambulatory Visit: Payer: Self-pay

## 2020-02-12 DIAGNOSIS — Z7982 Long term (current) use of aspirin: Secondary | ICD-10-CM | POA: Insufficient documentation

## 2020-02-12 DIAGNOSIS — S62232A Other displaced fracture of base of first metacarpal bone, left hand, initial encounter for closed fracture: Secondary | ICD-10-CM | POA: Diagnosis not present

## 2020-02-12 DIAGNOSIS — Z79891 Long term (current) use of opiate analgesic: Secondary | ICD-10-CM | POA: Insufficient documentation

## 2020-02-12 DIAGNOSIS — G4730 Sleep apnea, unspecified: Secondary | ICD-10-CM | POA: Insufficient documentation

## 2020-02-12 DIAGNOSIS — Z87891 Personal history of nicotine dependence: Secondary | ICD-10-CM | POA: Insufficient documentation

## 2020-02-12 DIAGNOSIS — E119 Type 2 diabetes mellitus without complications: Secondary | ICD-10-CM | POA: Insufficient documentation

## 2020-02-12 DIAGNOSIS — Z794 Long term (current) use of insulin: Secondary | ICD-10-CM | POA: Insufficient documentation

## 2020-02-12 DIAGNOSIS — E7800 Pure hypercholesterolemia, unspecified: Secondary | ICD-10-CM | POA: Diagnosis not present

## 2020-02-12 DIAGNOSIS — I10 Essential (primary) hypertension: Secondary | ICD-10-CM | POA: Diagnosis not present

## 2020-02-12 DIAGNOSIS — K219 Gastro-esophageal reflux disease without esophagitis: Secondary | ICD-10-CM | POA: Insufficient documentation

## 2020-02-12 DIAGNOSIS — Z79899 Other long term (current) drug therapy: Secondary | ICD-10-CM | POA: Insufficient documentation

## 2020-02-12 DIAGNOSIS — Z792 Long term (current) use of antibiotics: Secondary | ICD-10-CM | POA: Insufficient documentation

## 2020-02-12 DIAGNOSIS — S62643A Nondisplaced fracture of proximal phalanx of left middle finger, initial encounter for closed fracture: Secondary | ICD-10-CM | POA: Insufficient documentation

## 2020-02-12 DIAGNOSIS — W378XXA Explosion and rupture of other pressurized tire, pipe or hose, initial encounter: Secondary | ICD-10-CM | POA: Insufficient documentation

## 2020-02-12 DIAGNOSIS — E785 Hyperlipidemia, unspecified: Secondary | ICD-10-CM | POA: Diagnosis not present

## 2020-02-12 DIAGNOSIS — J449 Chronic obstructive pulmonary disease, unspecified: Secondary | ICD-10-CM | POA: Diagnosis not present

## 2020-02-12 DIAGNOSIS — E669 Obesity, unspecified: Secondary | ICD-10-CM | POA: Insufficient documentation

## 2020-02-12 DIAGNOSIS — G8918 Other acute postprocedural pain: Secondary | ICD-10-CM

## 2020-02-12 DIAGNOSIS — Z6835 Body mass index (BMI) 35.0-35.9, adult: Secondary | ICD-10-CM | POA: Insufficient documentation

## 2020-02-12 HISTORY — PX: CLOSED REDUCTION FINGER WITH PERCUTANEOUS PINNING: SHX5612

## 2020-02-12 LAB — GLUCOSE, CAPILLARY
Glucose-Capillary: 181 mg/dL — ABNORMAL HIGH (ref 70–99)
Glucose-Capillary: 157 mg/dL — ABNORMAL HIGH (ref 70–99)

## 2020-02-12 SURGERY — CLOSED REDUCTION, FINGER, WITH PERCUTANEOUS PINNING
Anesthesia: General | Site: Finger | Laterality: Left

## 2020-02-12 MED ORDER — ORAL CARE MOUTH RINSE: 15.0000 mL | Freq: Once | OROMUCOSAL | Status: AC

## 2020-02-12 MED ORDER — SODIUM CHLORIDE 0.9 % IV SOLN: Freq: Once | INTRAVENOUS | Status: AC

## 2020-02-12 MED ORDER — FENTANYL CITRATE (PF) 100 MCG/2ML IJ SOLN
INTRAMUSCULAR | Status: AC
  Filled 2020-02-12: qty 2

## 2020-02-12 MED ORDER — DEXAMETHASONE SODIUM PHOSPHATE 10 MG/ML IJ SOLN
INTRAMUSCULAR | Status: DC | PRN
  Administered 2020-02-12: 5 mg via INTRAVENOUS

## 2020-02-12 MED ORDER — METOCLOPRAMIDE HCL 5 MG/ML IJ SOLN: 5.0000 mg | Freq: Three times a day (TID) | INTRAMUSCULAR | Status: DC | PRN

## 2020-02-12 MED ORDER — ONDANSETRON HCL 4 MG/2ML IJ SOLN
INTRAMUSCULAR | Status: DC | PRN
  Administered 2020-02-12: 4 mg via INTRAVENOUS

## 2020-02-12 MED ORDER — ONDANSETRON HCL 4 MG PO TABS: 4.0000 mg | ORAL_TABLET | Freq: Four times a day (QID) | ORAL | Status: DC | PRN

## 2020-02-12 MED ORDER — METOCLOPRAMIDE HCL 10 MG PO TABS: 5.0000 mg | ORAL_TABLET | Freq: Three times a day (TID) | ORAL | Status: DC | PRN

## 2020-02-12 MED ORDER — PROPOFOL 10 MG/ML IV BOLUS
INTRAVENOUS | Status: DC | PRN
  Administered 2020-02-12: 50 mg via INTRAVENOUS
  Administered 2020-02-12: 150 mg via INTRAVENOUS

## 2020-02-12 MED ORDER — BUPIVACAINE HCL (PF) 0.5 % IJ SOLN
INTRAMUSCULAR | Status: DC | PRN
  Administered 2020-02-12: 10 mL

## 2020-02-12 MED ORDER — MIDAZOLAM HCL 2 MG/2ML IJ SOLN
INTRAMUSCULAR | Status: AC
  Filled 2020-02-12: qty 2

## 2020-02-12 MED ORDER — NEOMYCIN-POLYMYXIN B GU 40-200000 IR SOLN
Status: AC
  Filled 2020-02-12: qty 2

## 2020-02-12 MED ORDER — CHLORHEXIDINE GLUCONATE 0.12 % MT SOLN: 15.0000 mL | Freq: Once | OROMUCOSAL | Status: AC

## 2020-02-12 MED ORDER — ONDANSETRON HCL 4 MG/2ML IJ SOLN
INTRAMUSCULAR | Status: AC
  Filled 2020-02-12: qty 2

## 2020-02-12 MED ORDER — CEFAZOLIN SODIUM-DEXTROSE 2-4 GM/100ML-% IV SOLN
2.0000 g | INTRAVENOUS | Status: AC
  Administered 2020-02-12: 2 g via INTRAVENOUS

## 2020-02-12 MED ORDER — CHLORHEXIDINE GLUCONATE 0.12 % MT SOLN
OROMUCOSAL | Status: AC
  Administered 2020-02-12: 15 mL via OROMUCOSAL
  Filled 2020-02-12: qty 15

## 2020-02-12 MED ORDER — LIDOCAINE HCL (CARDIAC) PF 100 MG/5ML IV SOSY
PREFILLED_SYRINGE | INTRAVENOUS | Status: DC | PRN
  Administered 2020-02-12: 100 mg via INTRAVENOUS

## 2020-02-12 MED ORDER — CEFAZOLIN SODIUM-DEXTROSE 2-4 GM/100ML-% IV SOLN
INTRAVENOUS | Status: AC
  Filled 2020-02-12: qty 100

## 2020-02-12 MED ORDER — HYDROCODONE-ACETAMINOPHEN 5-325 MG PO TABS: 1.0000 | ORAL_TABLET | ORAL | 0 refills | Status: DC | PRN

## 2020-02-12 MED ORDER — LIDOCAINE HCL (PF) 2 % IJ SOLN
INTRAMUSCULAR | Status: AC
  Filled 2020-02-12: qty 5

## 2020-02-12 MED ORDER — ONDANSETRON HCL 4 MG/2ML IJ SOLN: 4.0000 mg | Freq: Four times a day (QID) | INTRAMUSCULAR | Status: DC | PRN

## 2020-02-12 MED ORDER — NEOMYCIN-POLYMYXIN B GU 40-200000 IR SOLN
Status: DC | PRN
  Administered 2020-02-12: 2 mL

## 2020-02-12 MED ORDER — LACTATED RINGERS IV SOLN: INTRAVENOUS | Status: DC | PRN

## 2020-02-12 MED ORDER — FENTANYL CITRATE (PF) 100 MCG/2ML IJ SOLN
INTRAMUSCULAR | Status: DC | PRN
  Administered 2020-02-12 (×3): 50 ug via INTRAVENOUS

## 2020-02-12 MED ORDER — PROPOFOL 10 MG/ML IV BOLUS
INTRAVENOUS | Status: AC
  Filled 2020-02-12: qty 20

## 2020-02-12 MED ORDER — DEXAMETHASONE SODIUM PHOSPHATE 10 MG/ML IJ SOLN
INTRAMUSCULAR | Status: AC
  Filled 2020-02-12: qty 1

## 2020-02-12 MED ORDER — BUPIVACAINE HCL (PF) 0.5 % IJ SOLN
INTRAMUSCULAR | Status: AC
  Filled 2020-02-12: qty 30

## 2020-02-12 SURGICAL SUPPLY — 32 items
BNDG ELASTIC 3X5.8 VLCR STR LF (GAUZE/BANDAGES/DRESSINGS) ×2 IMPLANT
BNDG GAUZE 1X2.1 STRL (MISCELLANEOUS) ×2 IMPLANT
CAST PADDING 2X4YD ST 30245 (MISCELLANEOUS) ×1
CAST PADDING 3X4FT ST 30246 (SOFTGOODS) ×1
CHLORAPREP W/TINT 26 (MISCELLANEOUS) ×2 IMPLANT
COVER WAND RF STERILE (DRAPES) ×2 IMPLANT
CUFF TOURN SGL QUICK 18X4 (TOURNIQUET CUFF) ×2 IMPLANT
DRAPE FLUOR MINI C-ARM 54X84 (DRAPES) ×2 IMPLANT
ELECT REM PT RETURN 9FT ADLT (ELECTROSURGICAL) ×2
ELECTRODE REM PT RTRN 9FT ADLT (ELECTROSURGICAL) ×1 IMPLANT
GAUZE SPONGE 4X4 12PLY STRL (GAUZE/BANDAGES/DRESSINGS) ×2 IMPLANT
GAUZE XEROFORM 1X8 LF (GAUZE/BANDAGES/DRESSINGS) ×2 IMPLANT
GLOVE SURG SYN 9.0  PF PI (GLOVE) ×1
GLOVE SURG SYN 9.0 PF PI (GLOVE) ×1 IMPLANT
GOWN SRG 2XL LVL 4 RGLN SLV (GOWNS) ×1 IMPLANT
GOWN STRL NON-REIN 2XL LVL4 (GOWNS) ×1
GOWN STRL REUS W/ TWL LRG LVL3 (GOWN DISPOSABLE) ×1 IMPLANT
GOWN STRL REUS W/TWL LRG LVL3 (GOWN DISPOSABLE) ×1
K-WIRE SMOOTH TROCAR 2.0X150 (WIRE) ×4
KIT TURNOVER KIT A (KITS) ×2 IMPLANT
KWIRE SMOOTH TROCAR 2.0X150 (WIRE) ×2 IMPLANT
NS IRRIG 500ML POUR BTL (IV SOLUTION) ×2 IMPLANT
PACK EXTREMITY (MISCELLANEOUS) ×2 IMPLANT
PAD CAST CTTN 3X4 STRL (SOFTGOODS) ×1 IMPLANT
PAD PREP 24X41 OB/GYN DISP (PERSONAL CARE ITEMS) ×2 IMPLANT
PADDING CAST COTTON 2X4 ST (MISCELLANEOUS) ×1 IMPLANT
SCALPEL PROTECTED #15 DISP (BLADE) ×4 IMPLANT
SPLINT CAST 1 STEP 3X12 (MISCELLANEOUS) ×2 IMPLANT
SPONGE GAUZE 2X2 8PLY STRL LF (GAUZE/BANDAGES/DRESSINGS) ×2 IMPLANT
SUT ETHIBOND 4-0 (SUTURE) ×2 IMPLANT
SUT ETHILON 5-0 FS-2 18 BLK (SUTURE) IMPLANT
smooth  zimmer k wire ×4 IMPLANT

## 2020-02-12 NOTE — Discharge Instructions (Addendum)
Keep arm elevated is much as you can.  Work on motion of the fingers other than the thumb. Ice to the back of the hand may help with pain and swelling for the next 1 to 2 days. Pain medicine as directed. Keep splint clean and dry.   AMBULATORY SURGERY  DISCHARGE INSTRUCTIONS   1) The drugs that you were given will stay in your system until tomorrow so for the next 24 hours you should not:  A) Drive an automobile B) Make any legal decisions C) Drink any alcoholic beverage   2) You may resume regular meals tomorrow.  Today it is better to start with liquids and gradually work up to solid foods.  You may eat anything you prefer, but it is better to start with liquids, then soup and crackers, and gradually work up to solid foods.   3) Please notify your doctor immediately if you have any unusual bleeding, trouble breathing, redness and pain at the surgery site, drainage, fever, or pain not relieved by medication.    4) Additional Instructions:        Please contact your physician with any problems or Same Day Surgery at [phone], Monday through Friday 6 am to 4 pm, or [place] at [facility] number at [phone].

## 2020-02-12 NOTE — OR Nursing (Signed)
Patient able to move all fingers on surgical side, warm to touch with brisk cap refill.  Denies any pain at this time, desires discharge.  Patient can resume aspirin today per dr Rudene Christians.

## 2020-02-12 NOTE — Transfer of Care (Signed)
Immediate Anesthesia Transfer of Care Note  Patient: Dylan Pittman  Procedure(s) Performed: Closed reduction and pinning of left first metacarpal fracture with possible open reduction (Left Finger)  Patient Location: PACU  Anesthesia Type:General  Level of Consciousness: awake, alert  and oriented  Airway & Oxygen Therapy: Patient Spontanous Breathing  Post-op Assessment: Report given to RN and Post -op Vital signs reviewed and stable  Post vital signs: Reviewed and stable  Last Vitals:  Vitals Value Taken Time  BP 162/99 02/12/20 1249  Temp    Pulse 97 02/12/20 1252  Resp 16 02/12/20 1252  SpO2 89 % 02/12/20 1252  Vitals shown include unvalidated device data.  Last Pain:  Vitals:   02/12/20 1012  PainSc: 0-No pain         Complications: No complications documented.

## 2020-02-12 NOTE — Anesthesia Preprocedure Evaluation (Signed)
Anesthesia Evaluation  Patient identified by MRN, date of birth, ID band Patient awake    Reviewed: Allergy & Precautions, H&P , NPO status , Patient's Chart, lab work & pertinent test results, reviewed documented beta blocker date and time   History of Anesthesia Complications Negative for: history of anesthetic complications  Airway Mallampati: II  TM Distance: >3 FB Neck ROM: full    Dental no notable dental hx. (+) Dental Advisory Given, Teeth Intact   Pulmonary sleep apnea and Continuous Positive Airway Pressure Ventilation , COPD, former smoker,    Pulmonary exam normal breath sounds clear to auscultation       Cardiovascular hypertension, On Medications Normal cardiovascular exam Rhythm:regular Rate:Normal - Systolic murmurs    Neuro/Psych negative neurological ROS  negative psych ROS   GI/Hepatic Neg liver ROS, GERD  Medicated and Controlled,  Endo/Other  diabetes  Renal/GU Renal disease  negative genitourinary   Musculoskeletal   Abdominal   Peds  Hematology negative hematology ROS (+)   Anesthesia Other Findings Past Medical History: No date: Chronic airway obstruction (HCC) No date: DDD (degenerative disc disease), lumbar No date: Dupuytren's disease 04/07/2014: Dyslipidemia 04/20/2014: Esophageal reflux 04/20/2014: Hypertension 04/07/2014: Microalbuminuria No date: Microalbuminuria 04/07/2014: Obesity, unspecified 04/20/2014: Sleep apnea 01/08/2014: 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  ([place]) Past Surgical History: 1992, 2005: BACK SURGERY No date: NASAL SEPTUM SURGERY 2005: VASECTOMY BMI    Body Mass Index:  32.08 kg/m     Reproductive/Obstetrics negative OB ROS                             Anesthesia Physical  Anesthesia Plan  ASA: II  Anesthesia Plan: General   Post-op Pain Management:    Induction: Intravenous  PONV Risk Score and Plan: 3  and Ondansetron, Dexamethasone and Midazolam  Airway Management Planned: LMA  Additional Equipment: None  Intra-op Plan:   Post-operative Plan: Extubation in OR  Informed Consent: I have reviewed the patients History and Physical, chart, labs and discussed the procedure including the risks, benefits and alternatives for the proposed anesthesia with the patient or authorized representative who has indicated his/her understanding and acceptance.     Dental Advisory Given  Plan Discussed with: CRNA  Anesthesia Plan Comments: (Discussed risks of anesthesia with patient, including PONV, sore throat, lip/dental damage. Rare risks discussed as well, such as cardiorespiratory and neurological sequelae. Patient understands.)        Anesthesia Quick Evaluation

## 2020-02-12 NOTE — Anesthesia Procedure Notes (Signed)
Procedure Name: LMA Insertion Performed by: Trigg Delarocha Ben, CRNA Pre-anesthesia Checklist: Patient identified, Emergency Drugs available, Suction available and Patient being monitored Patient Re-evaluated:Patient Re-evaluated prior to induction Oxygen Delivery Method: Circle system utilized Preoxygenation: Pre-oxygenation with 100% oxygen Induction Type: IV induction Ventilation: Mask ventilation without difficulty LMA: LMA inserted LMA Size: 3.5 Tube type: Oral Number of attempts: 1 Airway Equipment and Method: Oral airway Placement Confirmation: positive ETCO2 and breath sounds checked- equal and bilateral Tube secured with: Tape Dental Injury: Teeth and Oropharynx as per pre-operative assessment        

## 2020-02-12 NOTE — Op Note (Signed)
02/12/2020  12:46 PM  PATIENT:  Dylan Pittman  69 y.o. male  PRE-OPERATIVE DIAGNOSIS:  Closed displaced fracture of base of first metacarpal bone of left hand, initial encounter S62.232A Laceration of left hand without foreign body, initial encounter S61.412A Closed nondisplaced fracture of proximal phalanx of left middle finger, initial encounter S62.643A  POST-OPERATIVE DIAGNOSIS:  Closed displaced fracture of base of first metacarpal bone of left hand, initial encounter S62.232A Laceration of left hand without foreign body, initial encounter S61.412A Closed nondisplaced fracture of proximal phalanx of left middle finger, initial encounter S62.643A  PROCEDURE:  Procedure(s): Closed reduction and pinning of left first metacarpal fracture with possible open reduction (Left)  SURGEON: Laurene Footman, MD  ASSISTANTS: None  ANESTHESIA:   general  EBL:  No intake/output data recorded.  BLOOD ADMINISTERED:none  DRAINS: none   LOCAL MEDICATIONS USED:  MARCAINE     SPECIMEN:  No Specimen  DISPOSITION OF SPECIMEN:  N/A  COUNTS:  YES  TOURNIQUET:  * No tourniquets in log *  IMPLANTS: K wire x2  DICTATION: .Dragon Dictation patient was brought to the operating room and after adequate anesthesia was obtained the left arm was prepped and draped in the usual sterile fashion.  After patient identification and timeout procedures were completed mini C-arm was brought in and under direct visualization with traction and pressure at the base of the shaft anatomic reduction was obtained.  2 percutaneous K wires were inserted one from the base of the shaft into the second metacarpal and then a second into the carpus.  The fracture was stable.  The pins were cut short and bent over with Xeroform applied to the prior lacerations as well as around the pin sites with 4 x 4's placed labral and then a thumb spica splint.  PLAN OF CARE: Discharge to home after PACU  PATIENT DISPOSITION:  PACU -  hemodynamically stable.

## 2020-02-13 ENCOUNTER — Encounter: Payer: Self-pay | Admitting: Orthopedic Surgery

## 2020-02-13 NOTE — Anesthesia Postprocedure Evaluation (Signed)
Anesthesia Post Note  Patient: Dylan Pittman  Procedure(s) Performed: Closed reduction and pinning of left first metacarpal fracture with possible open reduction (Left Finger)  Patient location during evaluation: PACU Anesthesia Type: General Level of consciousness: awake and alert Pain management: pain level controlled Vital Signs Assessment: post-procedure vital signs reviewed and stable Respiratory status: spontaneous breathing and respiratory function stable Cardiovascular status: stable Anesthetic complications: no   No complications documented.   Last Vitals:  Vitals:   02/12/20 1413 02/12/20 1426  BP: 131/66 124/67  Pulse: 96 95  Resp: 16 18  Temp: (!) 36.3 C 36.5 C  SpO2: 94% 97%    Last Pain:  Vitals:   02/12/20 1426  TempSrc: Temporal  PainSc: 0-No pain                 Dezmen Alcock K

## 2020-04-19 ENCOUNTER — Ambulatory Visit
Admission: RE | Admit: 2020-04-19 | Discharge: 2020-04-19 | Disposition: A | Discharge status: Home / Self Care | Payer: BC Managed Care – PPO | Source: Ambulatory Visit | Attending: Urology | Admitting: Urology

## 2020-04-19 ENCOUNTER — Other Ambulatory Visit: Payer: Self-pay | Admitting: *Deleted

## 2020-04-19 ENCOUNTER — Ambulatory Visit
Admission: RE | Admit: 2020-04-19 | Discharge: 2020-04-19 | Disposition: A | Discharge status: Home / Self Care | Payer: BC Managed Care – PPO | Attending: Urology | Admitting: Urology

## 2020-04-19 ENCOUNTER — Encounter: Payer: Self-pay | Admitting: Urology

## 2020-04-19 ENCOUNTER — Other Ambulatory Visit: Payer: Self-pay

## 2020-04-19 ENCOUNTER — Ambulatory Visit: Payer: BC Managed Care – PPO | Admitting: Urology

## 2020-04-19 VITALS — BP 171/94 | HR 91 | Ht 68.0 in | Wt 232.0 lb

## 2020-04-19 DIAGNOSIS — R109 Unspecified abdominal pain: Secondary | ICD-10-CM | POA: Insufficient documentation

## 2020-04-19 DIAGNOSIS — N200 Calculus of kidney: Secondary | ICD-10-CM

## 2020-04-19 NOTE — Progress Notes (Signed)
04/19/2020 11:02 AM   Dylan Pittman 05-Aug-1951 [id number]  Referring provider: Sofie Hartigan, MD [place],  [place] [address]  Chief Complaint  Patient presents with   Nephrolithiasis    HPI: 69 y.o. male call this morning requesting an acute visit for right back and scrotal pain   Onset right hemiscrotal pain 8/19  8/20 had a right back pain which radiated to the right groin region and scrotum   Pain has been intermittently severe  Pain seems to be better when standing  Also notes frequency, urgency and voiding small amounts  No nausea, vomiting, fever, chills  I last saw 12/20 for elevated PSA   PMH: Past Medical History:  Diagnosis Date   Chronic airway obstruction ([place])    DDD (degenerative disc disease), lumbar    Dupuytren's disease    Dyslipidemia 04/07/2014   Esophageal reflux 04/20/2014   Hypertension 04/20/2014   Microalbuminuria 04/07/2014   Microalbuminuria    Obesity, unspecified 04/07/2014   Sleep apnea 04/20/2014   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 ([place]) 01/08/2014    Surgical History: Past Surgical History:  Procedure Laterality Date   BACK SURGERY  1992, 2005   CLOSED REDUCTION FINGER WITH PERCUTANEOUS PINNING Left 02/12/2020   Procedure: Closed reduction and pinning of left first metacarpal fracture with possible open reduction;  Surgeon: Hessie Knows, MD;  Location: [facility] ORS;  Service: Orthopedics;  Laterality: Left;   COLONOSCOPY WITH PROPOFOL N/A 12/28/2017   Procedure: COLONOSCOPY WITH PROPOFOL;  Surgeon: Manya Silvas, MD;  Location: [facility] ENDOSCOPY;  Service: Endoscopy;  Laterality: N/A;   NASAL SEPTUM SURGERY     VASECTOMY  2005    Home Medications:  Allergies as of 04/19/2020      Reactions   Pravastatin Other (See Comments)   Muscle pain      Medication List       Accurate as of April 19, 2020 11:02 AM. If you have any questions, ask your nurse or doctor.          albuterol 108 (90 Base) MCG/ACT inhaler Commonly known as: VENTOLIN HFA Inhale 2 puffs into the lungs every 6 (six) hours as needed.   ASPIRIN 81 PO Take 81 mg by mouth daily.   BD Pen Needle Nano U/F 32G X 4 MM Misc Generic drug: Insulin Pen Needle U QID   Dapagliflozin-metFORMIN HCl ER 12-998 MG Tb24 Take by mouth.   glimepiride 4 MG tablet Commonly known as: AMARYL Take 4 mg by mouth daily.   glucagon 1 MG injection Inject 1 mg into the skin once as needed (low bloos glucose).   glucose blood test strip Use 1 strip via meter three times daily as directed  ONE TOUCH ULTRA E11.65   Gvoke PFS 1 MG/0.2ML Sosy Generic drug: Glucagon Inject into the skin.   HYDROcodone-acetaminophen 5-325 MG tablet Commonly known as: Norco Take 1 tablet by mouth every 4 (four) hours as needed.   Invokamet XR [phone] MG Tb24 Generic drug: Canagliflozin-metFORMIN HCl ER Take 2 tablets by mouth daily.   losartan 50 MG tablet Commonly known as: COZAAR Take 50 mg by mouth daily.   Ozempic (1 MG/DOSE) 4 MG/3ML Sopn Generic drug: Semaglutide (1 MG/DOSE) Inject 4 mg into the skin once a week. Sunday   pantoprazole 40 MG tablet Commonly known as: PROTONIX Take 40 mg by mouth daily.   pioglitazone 30 MG tablet Commonly known as: ACTOS Take 30 mg by mouth daily.  rosuvastatin 5 MG tablet Commonly known as: CRESTOR Take 5 mg by mouth every Monday, Wednesday, and Friday.   sulfamethoxazole-trimethoprim 800-160 MG tablet Commonly known as: BACTRIM DS Take 1 tablet by mouth 2 (two) times daily.       Allergies:  Allergies  Allergen Reactions   Pravastatin Other (See Comments)    Muscle pain    Family History: Family History  Problem Relation Age of Onset   Prostate cancer Neg Hx    Kidney cancer Neg Hx     Social History:  reports that he has quit smoking. He has never used smokeless tobacco. He reports previous alcohol use. He reports previous drug use.   Physical  Exam: BP (!) 171/94 (BP Location: Left Arm, Patient Position: Sitting, Cuff Size: Normal)    Pulse 91    Ht 5\' 8"  (1.727 m)    Wt 232 lb (105.2 kg)    BMI 35.28 kg/m   Constitutional:  Alert and oriented, No acute distress. HEENT: [place] AT, moist mucus membranes.  Trachea midline, no masses. Cardiovascular: No clubbing, cyanosis, or edema. Respiratory: Normal respiratory effort, no increased work of breathing. GI: Abdomen is soft, nontender, nondistended, no abdominal masses GU: Phallus without lesions, testes descended bilaterally without masses or tenderness Skin: No rashes, bruises or suspicious lesions. Neurologic: Grossly intact, no focal deficits, moving all 4 extremities. Psychiatric: Normal mood and affect.  Laboratory Data:  Urinalysis Dipstick/microscopy negative  Pertinent Imaging: CT was personally reviewed  Assessment & Plan:    1.  Back pain  KUB today reviewed and there is a large amount of stool and bowel gas obscuring the renal outlines.  No definite calcifications suspicious for renal or ureteral stone is identified   Urinalysis without microhematuria  History lumbar disc disease and pain/groin symptoms may be musculoskeletal in etiology  Recommend stat CT stone protocol CT  Stone protocol CT was reviewed and no renal/ureteral calculi, hydronephrosis or GU abnormalities noted  Pain most likely musculoskeletal in etiology and recommend PCP follow-up.  Notified via MyChart and will have clinical staff give follow-up phone call   Abbie Sons, [facility] [address] [place], [place] [address] [phone]

## 2020-04-20 ENCOUNTER — Encounter: Payer: Self-pay | Admitting: Urology

## 2020-04-20 LAB — URINALYSIS, COMPLETE
Bilirubin, UA: NEGATIVE
Ketones, UA: NEGATIVE
Leukocytes,UA: NEGATIVE
Nitrite, UA: NEGATIVE
Protein,UA: NEGATIVE
RBC, UA: NEGATIVE
Specific Gravity, UA: 1.025 (ref 1.005–1.030)
Urobilinogen, Ur: 0.2 mg/dL (ref 0.2–1.0)
pH, UA: 5.5 (ref 5.0–7.5)

## 2020-04-20 LAB — MICROSCOPIC EXAMINATION
Bacteria, UA: NONE SEEN
Epithelial Cells (non renal): NONE SEEN /hpf (ref 0–10)

## 2020-04-27 ENCOUNTER — Encounter: Payer: Self-pay | Admitting: Occupational Therapy

## 2020-04-27 ENCOUNTER — Other Ambulatory Visit: Payer: Self-pay

## 2020-04-27 ENCOUNTER — Ambulatory Visit: Payer: BC Managed Care – PPO | Attending: Orthopedic Surgery | Admitting: Occupational Therapy

## 2020-04-27 DIAGNOSIS — M6281 Muscle weakness (generalized): Secondary | ICD-10-CM | POA: Diagnosis present

## 2020-04-27 DIAGNOSIS — M25632 Stiffness of left wrist, not elsewhere classified: Secondary | ICD-10-CM | POA: Diagnosis present

## 2020-04-27 DIAGNOSIS — L905 Scar conditions and fibrosis of skin: Secondary | ICD-10-CM | POA: Insufficient documentation

## 2020-04-27 DIAGNOSIS — M25642 Stiffness of left hand, not elsewhere classified: Secondary | ICD-10-CM | POA: Insufficient documentation

## 2020-04-27 DIAGNOSIS — M25532 Pain in left wrist: Secondary | ICD-10-CM | POA: Insufficient documentation

## 2020-04-27 DIAGNOSIS — M79642 Pain in left hand: Secondary | ICD-10-CM | POA: Insufficient documentation

## 2020-04-27 NOTE — Patient Instructions (Signed)
Pt to do contrast 3 x day  Soft tissue massage- pt to do scar massage, wife to help with [facility] spreads, thumb webspace with PA and RA of thumb , massage to lateral bands of PIP, joint mobs to MC's , interlocking of digits ,   Palm on table - PROM , gentle stretches 3 x 30 sec for MC and PIP extention , AROM extention of MC's and PIP's on paper on table  10 reps each  Tendon glides -MC flexion with PIP extention , blocked intrinsic fist, composite flexion to 4 cm foam block 10 reps  Gentle over the edge of table for wrist flexion , extention AAROM ,and AROM palms together RD,UD  1 0reps   Keep pain under 2/10 isotoner glove night time  Silicon sleeve am and pm for 3 hrs each on 4th and 3rd digits for scar tissue on dorsal PIP's

## 2020-04-27 NOTE — Therapy (Signed)
[place] [facility] [address], [place], [address] Phone: [phone]   Fax:  [phone]  Occupational Therapy Evaluation  Patient Details  Name: Dylan Pittman MRN: [id number] Date of Birth: Mar 15, 1951 Referring Provider (OT): Dr Rudene Christians   Encounter Date: 04/27/2020   OT End of Session - 04/27/20 1318    Visit Number 1    Number of Visits 12    Date for OT Re-Evaluation 06/08/20    OT Start Time 0838    OT Stop Time 0941    OT Time Calculation (min) 63 min    Activity Tolerance Patient tolerated treatment well    Behavior During Therapy [facility] for tasks assessed/performed           Past Medical History:  Diagnosis Date  . Chronic airway obstruction ([place])   . DDD (degenerative disc disease), lumbar   . Dupuytren's disease   . Dyslipidemia 04/07/2014  . Esophageal reflux 04/20/2014  . Hypertension 04/20/2014  . Microalbuminuria 04/07/2014  . Microalbuminuria   . Obesity, unspecified 04/07/2014  . Sleep apnea 04/20/2014  . 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 BACK SURGERY  1992, 2005  . CLOSED REDUCTION FINGER WITH PERCUTANEOUS PINNING Left 02/12/2020   Procedure: Closed reduction and pinning of left first metacarpal fracture with possible open reduction;  Surgeon: Hessie Knows, MD;  Location: [facility] ORS;  Service: Orthopedics;  Laterality: Left;  . COLONOSCOPY WITH PROPOFOL N/A 12/28/2017   Procedure: COLONOSCOPY WITH PROPOFOL;  Surgeon: Manya Silvas, MD;  Location: [facility] ENDOSCOPY;  Service: Endoscopy;  Laterality: N/A;  . NASAL SEPTUM SURGERY    . VASECTOMY  2005    There were no vitals filed for this visit.   Subjective Assessment - 04/27/20 1037    Subjective  I was fixing tire for our beach cart and rim exploded - my hand hurts into my wrist and up to my elbow - cannot carry ,lift , pull or push anything - my  hand is so stiff and tight    Pertinent History Pt went to ER after sustaining left hand fracture 02/06/2020 patient was fixing a tire up with air, tire exploded and the rim hit his hand. He suffered a fracture along the base of the left first metacarpal at the base of the thumb as well as several lacerations throughout the left hand. Lacerations were repaired on 3rd and 4th dorsal PIP's except for the left palm this was left open to allow to heal. . Patient took antibiotics. Pt had thumb metacarpal pinning done 6/17 by Dr Rudene Christians , pins come out on 03/05/2020  He is L hand dominant. 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    Currently in Pain? Yes    Pain Score 6     Pain Location Hand    Pain Orientation Left    Pain Descriptors / Indicators Aching;Tightness;Stabbing    Pain Type Acute pain;Surgical pain    Pain Onset More than  a month ago    Pain Frequency Intermittent    Aggravating Factors  making fist , squeeze , gripping             [facility] OT Assessment - 04/27/20 0001      Assessment   Medical Diagnosis L thumb metaparpal fx with pinning, lacerations on dorsal 4th and 3rd PIP    Referring Provider (OT) Dr Rudene Christians    Onset Date/Surgical Date 02/12/20    Hand Dominance Left      Home  Environment   Lives With Family      Prior Function   Vocation Full time employment    [employer] work - at the moment on computer , 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, 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 , likes to fix things in his workshop, fishing , yard work       AROM   Right Wrist Extension 58 Degrees    Right Wrist Flexion 68 Degrees    Right Wrist Radial Deviation 23 Degrees    Right Wrist  Ulnar Deviation 25 Degrees    Left Wrist Extension 48 Degrees    Left Wrist Flexion 50 Degrees    Left Wrist Radial Deviation 22 Degrees    Left Wrist Ulnar Deviation 21 Degrees      Right Hand AROM   R Thumb MCP 0-60 30 Degrees    R Thumb IP 0-80 70 Degrees    R Thumb Opposition to Index --   opposition lack 1 cm to 5th   R Index  MCP 0-90 75 Degrees    R Long  MCP 0-90 75 Degrees    R Ring  MCP 0-90 80 Degrees    R Little  MCP 0-90 85 Degrees      Left Hand AROM   L Thumb MCP 0-60 35 Degrees    L Thumb IP 0-80 45 Degrees    L Thumb Radial ADduction/ABduction 0-55 55    L Thumb Palmar ADduction/ABduction 0-45 55    L Thumb Opposition to Index --   Opposition 2 cm lacking to 5th    L Index  MCP 0-90 60 Degrees    L Index PIP 0-100 70 Degrees   -20   L Long  MCP 0-90 65 Degrees    L Long PIP 0-100 66 Degrees   -26   L Ring  MCP 0-90 50 Degrees    L Ring PIP 0-100 75 Degrees   -40   L Little  MCP 0-90 45 Degrees    L Little PIP 0-100 65 Degrees   -30                   OT Treatments/Exercises (OP) - 04/27/20 0001      LUE Fluidotherapy   Number Minutes Fluidotherapy 10 Minutes    LUE Fluidotherapy Location Hand;Wrist    Comments AROM in all planes prior to review of HEP            Pt to do contrast 3 x day  Soft tissue massage- pt to do scar massage, wife to help with [facility] spreads, thumb webspace with PA and RA of thumb , massage to lateral bands of PIP, joint mobs to MC's , interlocking of digits ,   Palm on table - PROM , gentle stretches 3 x 30 sec for MC and PIP extention , AROM extention of MC's and PIP's on paper on table  10 reps each  Tendon glides -MC flexion with PIP extention ,  blocked intrinsic fist, composite flexion to 4 cm foam block 10 reps  Gentle over the edge of table for wrist flexion , extention AAROM ,and AROM palms together RD,UD  1 0reps   Keep pain under 2/10 isotoner glove night time  Silicon sleeve am and pm for 3 hrs each on 4th  and 3rd digits for scar tissue on dorsal PIP's          OT Education - 04/27/20 1318    Education Details findings of eval and HEP    Person(s) Educated Patient    Methods Explanation;Demonstration;Tactile cues;Verbal cues;Handout    Comprehension Verbal cues required;Returned demonstration;Verbalized understanding            OT Short Term Goals - 04/27/20 1323      OT SHORT TERM GOAL #1   Title Pt to be independent in HEP to decrease pain to less than 3/10 at the worse and AROM in digits and wrist to squeeze toothpaste, turning doorknob    Baseline decrease digits in flexion , extention see flowsheet- and wrist decrease -pain 6/10    Time 3    Period Weeks    Status New    Target Date 05/18/20      OT SHORT TERM GOAL #2   Title L hand digits extention inrease to WNL to get hand in pocket and gloves    Baseline decrease extention of PIP's -20 to -40 degrees    Time 3    Period Weeks    Status New    Target Date 05/18/20             OT Long Term Goals - 04/27/20 1326      OT LONG TERM GOAL #1   Title L hand digits flexion increase for pt to touch palm to hold utencils , steering wheel    Baseline flexion , extention decrease in all digits , pain 6/10 - wrist flexion , ext decrease    Time 6    Period Weeks    Status New    Target Date 06/08/20      OT LONG TERM GOAL #2   Title L wrist AROM and strength increase to same as R to push up from chair , turn doorknob,  carry 10 lbs without increase symptoms    Baseline pain 6/10 - decrease wrist AROM - and decrease strength    Time 6    Period Weeks    Status New    Target Date 06/08/20      OT LONG TERM GOAL #3   Title L grip strength and prehension increase to more than 50% compare to R hand and no increase symptoms    Baseline pain 6/10 -with gripping , making fist - cannot make fist - ext and flexion decrease in digits - see flowsheet- grip NT    Time 6    Period Weeks    Status New    Target Date  06/08/20      OT LONG TERM GOAL #4   Title PRWHE function and  pain score improve with more than 15 points    Baseline PRWHE for pain 25/5o and function 23/50 at eval    Time 6    Period Weeks    Status New    Target Date 06/08/20                 Plan - 04/27/20 1319    Clinical Impression Statement Pt is 10 1/2 wks s/p  from thumb metacarpal fx with pinning , and lacerations to dorsal PIP of 3rd and 4th digits- pt present with severe stiffness in flexion , extention all digits in L dominant hand, and wrist -  increase pain and edema - limiting pt's strength and functional use of L domimant hand in ADL's and IADL's - pt can benefit from OT services    Occupational performance deficits (Please refer to evaluation for details): ADL's;IADL's;Work;Play;Leisure;Social Participation    Body Structure / Function / Physical Skills ADL;Decreased knowledge of precautions;Flexibility;ROM;UE functional use;Scar mobility;FMC;Dexterity;Edema;Pain;Strength;IADL    Rehab Potential Good    Clinical Decision Making Limited treatment options, no task modification necessary    Comorbidities Affecting Occupational Performance: None    Modification or Assistance to Complete Evaluation  No modification of tasks or assist necessary to complete eval    OT Frequency 2x / week    OT Duration 6 weeks    OT Treatment/Interventions Self-care/ADL training;Therapeutic exercise;Patient/family education;Splinting;Paraffin;Fluidtherapy;Contrast Bath;Passive range of motion;Manual Therapy;Scar mobilization    Plan assess progress with HEP    OT Home Exercise Plan see pt instruction    Consulted and Agree with Plan of Care Patient           Patient will benefit from skilled therapeutic intervention in order to improve the following deficits and impairments:   Body Structure / Function / Physical Skills: ADL, Decreased knowledge of precautions, Flexibility, ROM, UE functional use, Scar mobility, FMC, Dexterity,  Edema, Pain, Strength, IADL       Visit Diagnosis: Stiffness of left hand, not elsewhere classified - Plan: Ot plan of care cert/re-cert  Stiffness of left wrist, not elsewhere classified - Plan: Ot plan of care cert/re-cert  Scar tissue - Plan: Ot plan of care cert/re-cert  Pain in left hand - Plan: Ot plan of care cert/re-cert  Pain in left wrist - Plan: Ot plan of care cert/re-cert  Muscle weakness (generalized) - Plan: Ot plan of care cert/re-cert    Problem List Patient Active Problem List   Diagnosis Date Noted  . Benign prostatic hyperplasia with lower urinary tract symptoms 07/31/2019  . Elevated PSA 06/18/2018  . Vitamin D deficiency 01/18/2018  . Esophageal reflux 04/20/2014  . Hypertension 04/20/2014  . Sleep apnea 04/20/2014  . Dyslipidemia 04/07/2014  . Microalbuminuria 04/07/2014  . Obesity, unspecified 04/07/2014  . Hyperlipidemia due to type 2 diabetes mellitus ([place]) 04/07/2014  . 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 Type 2 diabetes mellitus with diabetic polyneuropathy, with long-term current use of insulin ([place]) 01/08/2014    Alisson Rozell OTR/l,CLT 04/27/2020, 2:39 PM  [facility] [facility] [address], [place], [address] Phone: [phone]   Fax:  [phone]  Name: Dylan Pittman MRN: [id number] Date of Birth: [birth year]

## 2020-04-30 ENCOUNTER — Ambulatory Visit: Payer: BC Managed Care – PPO | Attending: Orthopedic Surgery | Admitting: Occupational Therapy

## 2020-04-30 ENCOUNTER — Other Ambulatory Visit: Payer: Self-pay

## 2020-04-30 DIAGNOSIS — M79642 Pain in left hand: Secondary | ICD-10-CM | POA: Diagnosis present

## 2020-04-30 DIAGNOSIS — L905 Scar conditions and fibrosis of skin: Secondary | ICD-10-CM

## 2020-04-30 DIAGNOSIS — M25632 Stiffness of left wrist, not elsewhere classified: Secondary | ICD-10-CM | POA: Insufficient documentation

## 2020-04-30 DIAGNOSIS — M6281 Muscle weakness (generalized): Secondary | ICD-10-CM | POA: Diagnosis present

## 2020-04-30 DIAGNOSIS — M25642 Stiffness of left hand, not elsewhere classified: Secondary | ICD-10-CM | POA: Diagnosis present

## 2020-04-30 DIAGNOSIS — M25532 Pain in left wrist: Secondary | ICD-10-CM | POA: Insufficient documentation

## 2020-04-30 NOTE — Therapy (Signed)
[place] [facility] [address] [place], [address] Phone: [phone]   Fax:  [phone]  Occupational Therapy Treatment  Patient Details  Name: Dylan Pittman MRN: [id number] Date of Birth: February 14, 1951 Referring Provider (OT): Dr Rudene Christians   Encounter Date: 04/30/2020   OT End of Session - 04/30/20 0949    Visit Number 2    Number of Visits 12    Date for OT Re-Evaluation 06/08/20    OT Start Time 0935    OT Stop Time 1025    OT Time Calculation (min) 50 min    Activity Tolerance Patient tolerated treatment well    Behavior During Therapy [facility] for tasks assessed/performed           Past Medical History:  Diagnosis Date  . Chronic airway obstruction ([place])   . DDD (degenerative disc disease), lumbar   . Dupuytren's disease   . Dyslipidemia 04/07/2014  . Esophageal reflux 04/20/2014  . Hypertension 04/20/2014  . Microalbuminuria 04/07/2014  . Microalbuminuria   . Obesity, unspecified 04/07/2014  . Sleep apnea 04/20/2014  . 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 BACK SURGERY  1992, 2005  . CLOSED REDUCTION FINGER WITH PERCUTANEOUS PINNING Left 02/12/2020   Procedure: Closed reduction and pinning of left first metacarpal fracture with possible open reduction;  Surgeon: Hessie Knows, MD;  Location: [facility] ORS;  Service: Orthopedics;  Laterality: Left;  . COLONOSCOPY WITH PROPOFOL N/A 12/28/2017   Procedure: COLONOSCOPY WITH PROPOFOL;  Surgeon: Manya Silvas, MD;  Location: [facility] ENDOSCOPY;  Service: Endoscopy;  Laterality: N/A;  . NASAL SEPTUM SURGERY    . VASECTOMY  2005    There were no vitals filed for this visit.   Subjective Assessment - 04/30/20 0943    Subjective  Did okay with the exercises - was able to cut meat the other day - but in the morning it is just stiff    Pertinent History Pt went to ER after sustaining  left hand fracture 02/06/2020 patient was fixing a tire up with air, tire exploded and the rim hit his hand. He suffered a fracture along the base of the left first metacarpal at the base of the thumb as well as several lacerations throughout the left hand. Lacerations were repaired on 3rd and 4th dorsal PIP's except for the left palm this was left open to allow to heal. . Patient took antibiotics. Pt had thumb metacarpal pinning done 6/17 by Dr Rudene Christians , pins come out on 03/05/2020  He is L hand dominant. 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    Currently in Pain? No/denies               Pt cont to show tightness, stiffness and inflammation in L hand and volar wrist- report  Most of his pain in volar wrist and ulnar side of hand  explain pt over using flexors to make fist -to over come the tightness in hand  OT Treatments/Exercises (OP) - 04/30/20 0001      LUE Contrast Bath   Time 9 minutes    Comments LMB splint on 3rd and 5th digits to increase extention             Soft tissue massage by OT - MC spreads , webspace massage with PA and RA stretches , mini massager on dorsal scars on 4th and 3rd - soft tissue massage to lateral bands with gentle traction to digits after pt been in LMB extention splints for PIP on 3rd and 5th graston tool nr 2 sweeping and brushing on volar forearm , wrist , and hand to digits prior to ROM    Wife to cont to do scar massage, wife to help with [facility] spreads, thumb webspace with PA and RA of thumb , massage to lateral bands of PIP, joint mobs to MC's , interlocking of digits ,   Rolling palm and hand over foam roller 20  reps and add to HEP prior to his extention HEP  Palm on table - PROM , gentle stretches 3 x 30 sec for MC and PIP extention , AROM extention of MC's and PIP's on paper on table  Tapping of digits 5 reps each 10 reps each   Tendon glides -MC flexion with PIP extention( VERY TIGHT) , blocked intrinsic fist, composite flexion to 3 cm foam block 10 reps  Gentle over the edge of table for wrist flexion , extention AAROM ,and AROM palms together RD,UD  OT done AAROM for wrist flexion and composite extention for wrist and digits 10 reps   Keep pain under 2/10 isotoner glove night time  Silicon sleeve am and pm for 3 hrs each on 4th and 3rd digits for scar tissue on dorsal PIP's          OT Education - 04/30/20 1035    Education Details progress and changes to HEP    Person(s) Educated Patient    Methods Explanation;Demonstration;Tactile cues;Verbal cues;Handout    Comprehension Verbal cues required;Returned demonstration;Verbalized understanding            OT Short Term Goals - 04/27/20 1323      OT SHORT TERM GOAL #1   Title Pt to be independent in HEP to decrease pain to less than 3/10 at the worse and AROM in digits and wrist to squeeze toothpaste, turning doorknob    Baseline decrease digits in flexion , extention see flowsheet- and wrist decrease -pain 6/10    Time 3    Period Weeks    Status New    Target Date 05/18/20      OT SHORT TERM GOAL #2   Title L hand digits extention inrease to WNL to get hand in pocket and gloves    Baseline decrease extention of PIP's -20 to -40 degrees    Time 3    Period Weeks    Status New    Target Date 05/18/20             OT Long Term Goals - 04/27/20 1326      OT LONG TERM GOAL #1   Title L hand digits flexion increase for pt to touch palm to hold utencils , steering wheel    Baseline flexion , extention decrease in all digits , pain 6/10 - wrist flexion , ext decrease    Time 6    Period Weeks    Status New    Target  Date 06/08/20  OT LONG TERM GOAL #2   Title L wrist AROM and strength increase to same as R to push up from chair , turn doorknob,  carry 10 lbs without increase symptoms    Baseline pain 6/10 - decrease wrist AROM - and decrease strength    Time 6    Period Weeks    Status New    Target Date 06/08/20      OT LONG TERM GOAL #3   Title L grip strength and prehension increase to more than 50% compare to R hand and no increase symptoms    Baseline pain 6/10 -with gripping , making fist - cannot make fist - ext and flexion decrease in digits - see flowsheet- grip NT    Time 6    Period Weeks    Status New    Target Date 06/08/20      OT LONG TERM GOAL #4   Title PRWHE function and  pain score improve with more than 15 points    Baseline PRWHE for pain 25/5o and function 23/50 at eval    Time 6    Period Weeks    Status New    Target Date 06/08/20                 Plan - 04/30/20 1036    Clinical Impression Statement Pt is 11 wks s/p L thumb metacarpal fx with pinning - lacerations to dorsal PIP of 3rd and 4th digit- pt cont to have severe stiffness in L hand and inflammation over hand , webspace , MC's  of hand. Limiting his AROM for flexion and extention - with increase pain - pt did show increase PIP flexion -but PIP extention lag  was about same - add this date LMB splint to HEP for extnetion of PIP with great success and roling over foam foller - pt to add to HEP this next few days    OT Occupational Profile and History Problem Focused Assessment - Including review of records relating to presenting problem    Occupational performance deficits (Please refer to evaluation for details): ADL's;IADL's;Work;Play;Leisure;Social Participation    Body Structure / Function / Physical Skills ADL;Decreased knowledge of precautions;Flexibility;ROM;UE functional use;Scar mobility;FMC;Dexterity;Edema;Pain;Strength;IADL    Rehab Potential Good    Clinical Decision Making Limited treatment  options, no task modification necessary    Comorbidities Affecting Occupational Performance: None    Modification or Assistance to Complete Evaluation  No modification of tasks or assist necessary to complete eval    OT Frequency 2x / week    OT Duration 6 weeks    OT Treatment/Interventions Self-care/ADL training;Therapeutic exercise;Patient/family education;Splinting;Paraffin;Fluidtherapy;Contrast Bath;Passive range of motion;Manual Therapy;Scar mobilization    Plan assess progress with HEP    OT Home Exercise Plan see pt instruction    Consulted and Agree with Plan of Care Patient           Patient will benefit from skilled therapeutic intervention in order to improve the following deficits and impairments:   Body Structure / Function / Physical Skills: ADL, Decreased knowledge of precautions, Flexibility, ROM, UE functional use, Scar mobility, FMC, Dexterity, Edema, Pain, Strength, IADL       Visit Diagnosis: Stiffness of left hand, not elsewhere classified  Stiffness of left wrist, not elsewhere classified  Scar tissue  Pain in left hand  Pain in left wrist  Muscle weakness (generalized)    Problem List Patient Active Problem List   Diagnosis Date Noted  . Benign prostatic hyperplasia with  lower urinary tract symptoms 07/31/2019  . Elevated PSA 06/18/2018  . Vitamin D deficiency 01/18/2018  . Esophageal reflux 04/20/2014  . Hypertension 04/20/2014  . Sleep apnea 04/20/2014  . Dyslipidemia 04/07/2014  . Microalbuminuria 04/07/2014  . Obesity, unspecified 04/07/2014  . Hyperlipidemia due to type 2 diabetes mellitus ([place]) 04/07/2014  . 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 Type 2 diabetes mellitus with diabetic polyneuropathy, with long-term current use of insulin ([place]) 01/08/2014    Leondra Cullin OTR/l,CLT  04/30/2020, 10:47 AM  [facility] [facility] [address], [place], [address] Phone: [phone]   Fax:  [phone]  Name: Dylan Pittman MRN: [id number] Date of Birth: 1950-09-01

## 2020-05-04 ENCOUNTER — Other Ambulatory Visit: Payer: Self-pay

## 2020-05-04 ENCOUNTER — Ambulatory Visit: Payer: BC Managed Care – PPO | Admitting: Occupational Therapy

## 2020-05-04 DIAGNOSIS — M25632 Stiffness of left wrist, not elsewhere classified: Secondary | ICD-10-CM

## 2020-05-04 DIAGNOSIS — M25642 Stiffness of left hand, not elsewhere classified: Secondary | ICD-10-CM | POA: Diagnosis not present

## 2020-05-04 DIAGNOSIS — M25532 Pain in left wrist: Secondary | ICD-10-CM

## 2020-05-04 DIAGNOSIS — M6281 Muscle weakness (generalized): Secondary | ICD-10-CM

## 2020-05-04 DIAGNOSIS — M79642 Pain in left hand: Secondary | ICD-10-CM

## 2020-05-04 DIAGNOSIS — L905 Scar conditions and fibrosis of skin: Secondary | ICD-10-CM

## 2020-05-04 NOTE — Therapy (Signed)
[facility] [address], [place], [address] Phone: [phone]   Fax:  [phone]  Occupational Therapy Treatment  Patient Details  Name: Dylan Pittman MRN: [id number] Date of Birth: 1951-07-22 Referring Provider (OT): Dr Rudene Christians   Encounter Date: 05/04/2020   OT End of Session - 05/04/20 1655    Visit Number 3    Number of Visits 12    Date for OT Re-Evaluation 06/08/20    OT Start Time 1530    OT Stop Time 1617    OT Time Calculation (min) 47 min    Activity Tolerance Patient tolerated treatment well    Behavior During Therapy [facility] for tasks assessed/performed           Past Medical History:  Diagnosis Date  . Chronic airway obstruction ([place])   . DDD (degenerative disc disease), lumbar   . Dupuytren's disease   . Dyslipidemia 04/07/2014  . Esophageal reflux 04/20/2014  . Hypertension 04/20/2014  . Microalbuminuria 04/07/2014  . Microalbuminuria   . Obesity, unspecified 04/07/2014  . Sleep apnea 04/20/2014  . 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 BACK SURGERY  1992, 2005  . CLOSED REDUCTION FINGER WITH PERCUTANEOUS PINNING Left 02/12/2020   Procedure: Closed reduction and pinning of left first metacarpal fracture with possible open reduction;  Surgeon: Hessie Knows, MD;  Location: [facility] ORS;  Service: Orthopedics;  Laterality: Left;  . COLONOSCOPY WITH PROPOFOL N/A 12/28/2017   Procedure: COLONOSCOPY WITH PROPOFOL;  Surgeon: Manya Silvas, MD;  Location: [facility] ENDOSCOPY;  Service: Endoscopy;  Laterality: N/A;  . NASAL SEPTUM SURGERY    . VASECTOMY  2005    There were no vitals filed for this visit.   Subjective Assessment - 05/04/20 1653    Subjective  My hand jurts - told my wife I should have done therapy in the beginning and not waited so long- in the mornings my hand stiff    Pertinent History Pt went  to ER after sustaining left hand fracture 02/06/2020 patient was fixing a tire up with air, tire exploded and the rim hit his hand. He suffered a fracture along the base of the left first metacarpal at the base of the thumb as well as several lacerations throughout the left hand. Lacerations were repaired on 3rd and 4th dorsal PIP's except for the left palm this was left open to allow to heal. . Patient took antibiotics. Pt had thumb metacarpal pinning done 6/17 by Dr Rudene Christians , pins come out on 03/05/2020  He is L hand dominant. 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    Currently in Pain? Yes    Pain Score 5     Pain Location Hand    Pain Orientation Left    Pain Descriptors / Indicators Aching;Tightness;Stabbing    Pain Type Acute pain;Surgical pain    Pain Onset More than a month ago    Pain Frequency Intermittent    Aggravating Factors  making fist , PROM              [facility] OT Assessment - 05/04/20 0001      AROM   Left Wrist Extension 55 Degrees    Left Wrist Flexion 60 Degrees      Strength   Right Hand Grip (lbs) 74    Right Hand Lateral Pinch 23 lbs    Right Hand 3 Point Pinch 14 lbs    Left Hand Grip (lbs) 28    Left Hand Lateral Pinch 15 lbs    Left Hand 3 Point Pinch 10 lbs      Left Hand AROM   L Thumb MCP 0-60 35 Degrees    L Thumb IP 0-80 55 Degrees    L Index  MCP 0-90 60 Degrees    L Index PIP 0-100 85 Degrees    L Long  MCP 0-90 65 Degrees    L Long PIP 0-100 80 Degrees    L Ring  MCP 0-90 60 Degrees    L Ring PIP 0-100 80 Degrees    L Little  MCP 0-90 65 Degrees    L Little PIP 0-100 85 Degrees            measurements taken -  progress in flexion - but extention of PIP's about same    Pt cont to show tightness, stiffness and inflammation in L hand and volar wrist- report  Most of his pain in ulnar side of hand  explain pt over using flexors to make fist -to over come the tightness in hand and use flexors in wrist too       OT Treatments/Exercises (OP) - 05/04/20 0001      LUE Paraffin   Number Minutes Paraffin 8 Minutes    LUE Paraffin Location Hand    Comments LMB splint on 4th PIP extention            Soft tissue massage by OT - MC spreads , webspace massage with PA and RA stretches , mini massager on dorsal scars on 4th and 3rd and volar digits this date - soft tissue massage to lateral bands with gentle traction to digits after pt been in LMB extention splints for PIP on 3rd and 5th graston tool nr 2 sweeping and brushing on volar forearm , wrist , and hand to digits prior to ROM    Wife to cont to do scar massage, wife to help with [facility] spreads, thumb webspace with PA and RA of thumb , massage to lateral bands of PIP, joint mobs to MC's , interlocking of digits ,   Rolling palm and hand over foam roller 20 reps and add to HEP last time - to cont with  prior to his extention HEP  Palm on table - PROM , gentle stretches 3 x 30 sec for MC and PIP extention , AROM extention of MC's and PIP's on paper on table  Tapping of digits 5 reps each 10 reps each   Prior to tendon glides - knuckle bender splint for MC flexion stretch- 1-2 band on either side - 3-5 min  Tendon glides -MC flexion with PIP extention , blocked intrinsic fist, composite flexion to 3 cm foam roller 10 reps, and 2 cm foam block  Gentle over the edge of table for wrist flexion , extention AAROM ,and AROM palms together RD,UD  OT done AAROM for wrist flexion and composite extention for wrist and digits 10 reps   Keep  pain under 2/10 isotoner glove night time - but if causing pain - remove Silicon sleeve am and pm for 3 hrs each on 4th  and 3rd digits for scar tissue on dorsal PIP's         OT Education - 05/04/20 1655    Education Details progress and changes to HEP    Person(s) Educated Patient    Methods Explanation;Demonstration;Tactile cues;Verbal cues;Handout    Comprehension Verbal cues required;Returned demonstration;Verbalized understanding            OT Short Term Goals - 04/27/20 1323      OT SHORT TERM GOAL #1   Title Pt to be independent in HEP to decrease pain to less than 3/10 at the worse and AROM in digits and wrist to squeeze toothpaste, turning doorknob    Baseline decrease digits in flexion , extention see flowsheet- and wrist decrease -pain 6/10    Time 3    Period Weeks    Status New    Target Date 05/18/20      OT SHORT TERM GOAL #2   Title L hand digits extention inrease to WNL to get hand in pocket and gloves    Baseline decrease extention of PIP's -20 to -40 degrees    Time 3    Period Weeks    Status New    Target Date 05/18/20             OT Long Term Goals - 04/27/20 1326      OT LONG TERM GOAL #1   Title L hand digits flexion increase for pt to touch palm to hold utencils , steering wheel    Baseline flexion , extention decrease in all digits , pain 6/10 - wrist flexion , ext decrease    Time 6    Period Weeks    Status New    Target Date 06/08/20      OT LONG TERM GOAL #2   Title L wrist AROM and strength increase to same as R to push up from chair , turn doorknob,  carry 10 lbs without increase symptoms    Baseline pain 6/10 - decrease wrist AROM - and decrease strength    Time 6    Period Weeks    Status New    Target Date 06/08/20      OT LONG TERM GOAL #3   Title L grip strength and prehension increase to more than 50% compare to R hand and no increase symptoms    Baseline pain 6/10 -with gripping , making fist - cannot make fist - ext and flexion decrease in digits - see flowsheet- grip NT    Time 6    Period Weeks    Status New    Target Date  06/08/20      OT LONG TERM GOAL #4   Title PRWHE function and  pain score improve with more than 15 points    Baseline PRWHE for pain 25/5o and function 23/50 at eval    Time 6    Period Weeks    Status New    Target Date 06/08/20                 Plan - 05/04/20 1655    Clinical Impression Statement Pt is 11 1/2 wks s/p L thumb metacarpal fx with pinning , lacerations on dorsal PIP of 3rd and 4th digits - pt cont to show increase edema, stiffness and pain - but showed great progress  in PIP flexion , and some in [place] flexion -PIP's extention about same, and tightness more in MC's than PIP's - did add this date knuckle bender splint for MC flexion 3-5 min - and not to play with fingers the all day - only HEP 3 x day and using it    OT Occupational Profile and History Problem Focused Assessment - Including review of records relating to presenting problem    Occupational performance deficits (Please refer to evaluation for details): ADL's;IADL's;Work;Play;Leisure;Social Participation    Body Structure / Function / Physical Skills ADL;Decreased knowledge of precautions;Flexibility;ROM;UE functional use;Scar mobility;FMC;Dexterity;Edema;Pain;Strength;IADL    Rehab Potential Good    Clinical Decision Making Limited treatment options, no task modification necessary    Comorbidities Affecting Occupational Performance: None    Modification or Assistance to Complete Evaluation  No modification of tasks or assist necessary to complete eval    OT Frequency 2x / week    OT Duration 6 weeks    OT Treatment/Interventions Self-care/ADL training;Therapeutic exercise;Patient/family education;Splinting;Paraffin;Fluidtherapy;Contrast Bath;Passive range of motion;Manual Therapy;Scar mobilization    Plan assess progress with HEP    OT Home Exercise Plan see pt instruction    Consulted and Agree with Plan of Care Patient           Patient will benefit from skilled therapeutic intervention in order to  improve the following deficits and impairments:   Body Structure / Function / Physical Skills: ADL, Decreased knowledge of precautions, Flexibility, ROM, UE functional use, Scar mobility, FMC, Dexterity, Edema, Pain, Strength, IADL       Visit Diagnosis: Stiffness of left hand, not elsewhere classified  Stiffness of left wrist, not elsewhere classified  Scar tissue  Pain in left hand  Pain in left wrist  Muscle weakness (generalized)    Problem List Patient Active Problem List   Diagnosis Date Noted  . Benign prostatic hyperplasia with lower urinary tract symptoms 07/31/2019  . Elevated PSA 06/18/2018  . Vitamin D deficiency 01/18/2018  . Esophageal reflux 04/20/2014  . Hypertension 04/20/2014  . Sleep apnea 04/20/2014  . Dyslipidemia 04/07/2014  . Microalbuminuria 04/07/2014  . Obesity, unspecified 04/07/2014  . Hyperlipidemia due to type 2 diabetes mellitus ([place]) 04/07/2014  . 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 Type 2 diabetes mellitus with diabetic polyneuropathy, with long-term current use of insulin ([place]) 01/08/2014    Polk Minor OTR/l,CLT 05/04/2020, 4:59 PM  [facility] [address] [place], [address] Phone: [phone]   Fax:  [phone]  Name: Dylan Pittman MRN: [id number] Date of Birth: 1950/09/22

## 2020-05-06 ENCOUNTER — Ambulatory Visit: Payer: BC Managed Care – PPO | Admitting: Occupational Therapy

## 2020-05-06 ENCOUNTER — Other Ambulatory Visit: Payer: Self-pay

## 2020-05-06 DIAGNOSIS — M25642 Stiffness of left hand, not elsewhere classified: Secondary | ICD-10-CM

## 2020-05-06 DIAGNOSIS — L905 Scar conditions and fibrosis of skin: Secondary | ICD-10-CM

## 2020-05-06 DIAGNOSIS — M79642 Pain in left hand: Secondary | ICD-10-CM

## 2020-05-06 DIAGNOSIS — M25632 Stiffness of left wrist, not elsewhere classified: Secondary | ICD-10-CM

## 2020-05-06 DIAGNOSIS — M25532 Pain in left wrist: Secondary | ICD-10-CM

## 2020-05-06 DIAGNOSIS — M6281 Muscle weakness (generalized): Secondary | ICD-10-CM

## 2020-05-06 NOTE — Therapy (Signed)
[facility] [address] [place], [address] Phone: [phone]   Fax:  [phone]  Occupational Therapy Treatment  Patient Details  Name: Dylan Pittman MRN: [id number] Date of Birth: [birth year] Referring Provider (OT): Dr Rudene Christians   Encounter Date: 05/06/2020   OT End of Session - 05/06/20 1528    Visit Number 4    Number of Visits 12    Date for OT Re-Evaluation 06/08/20    OT Start Time 1509    OT Stop Time 1610    OT Time Calculation (min) 61 min    Activity Tolerance Patient tolerated treatment well    Behavior During Therapy [facility] for tasks assessed/performed           Past Medical History:  Diagnosis Date  . Chronic airway obstruction ([place])   . DDD (degenerative disc disease), lumbar   . Dupuytren's disease   . Dyslipidemia 04/07/2014  . Esophageal reflux 04/20/2014  . Hypertension 04/20/2014  . Microalbuminuria 04/07/2014  . Microalbuminuria   . Obesity, unspecified 04/07/2014  . Sleep apnea 04/20/2014  . 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 BACK SURGERY  1992, 2005  . CLOSED REDUCTION FINGER WITH PERCUTANEOUS PINNING Left 02/12/2020   Procedure: Closed reduction and pinning of left first metacarpal fracture with possible open reduction;  Surgeon: Hessie Knows, MD;  Location: [facility] ORS;  Service: Orthopedics;  Laterality: Left;  . COLONOSCOPY WITH PROPOFOL N/A 12/28/2017   Procedure: COLONOSCOPY WITH PROPOFOL;  Surgeon: Manya Silvas, MD;  Location[facility]COPY;  Service: Endoscopy;  Laterality: N/A;  . NASAL SEPTUM SURGERY    . VASECTOMY  2005    There were no vitals filed for this visit.   Subjective Assessment - 05/06/20 1527    Subjective  I hurt the night after I left here last time- did not wear the glove at night and felt better in the morning    Pertinent History Pt went to ER after  sustaining left hand fracture 02/06/2020 patient was fixing a tire up with air, tire exploded and the rim hit his hand. He suffered a fracture along the base of the left first metacarpal at the base of the thumb as well as several lacerations throughout the left hand. Lacerations were repaired on 3rd and 4th dorsal PIP's except for the left palm this was left open to allow to heal. . Patient took antibiotics. Pt had thumb metacarpal pinning done 6/17 by Dr Rudene Christians , pins come out on 03/05/2020  He is L hand dominant. 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    Currently in Pain? No/denies              [facility] OT Assessment - 05/06/20 0001      AROM   Left Wrist Extension 60 Degrees   in session   Left Wrist Flexion 70 Degrees   in session     Left Hand AROM   L Index  MCP 0-90 68 Degrees    L Index PIP 0-100 82 Degrees    L Long  MCP 0-90 68 Degrees    L Long PIP 0-100 76 Degrees    L Ring  MCP 0-90 68 Degrees    L Ring PIP 0-100 85 Degrees    L Little  MCP 0-90 68 Degrees    L Little PIP 0-100 85 Degrees                    OT Treatments/Exercises (OP) - 05/06/20 0001      LUE Paraffin   Number Minutes Paraffin 10 Minutes    LUE Paraffin Location Hand   wrist   Comments LMb splint on 3rd and 4th for PIP extention , wrist flexion , ext stretch             Soft tissue massageby OT - MC spreads , webspace massage with PA and RA stretches , mini massager on dorsal scars on 4th and 3rd and volar digits this date - soft tissue massage to lateral bands with gentle traction to digits after pt been in LMB extention splints for PIP on 3rd and 5th graston tool  nr 2 sweeping and brushing on volar forearm , wrist , and hand to digits prior to ROM  Done this date also traction , and joint mobs to wrist and Carpal bones prior to wrist flexion , extention stretches - add to HEP - did increase with 5 and 10 degrees in session -but pt is tight in R wrist too- 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   Wife to [employer] do scar massage, wife to help with [facility] spreads, thumb webspace with PA and RA of thumb , massage to lateral bands of PIP, joint mobs to MC's , interlocking of digits ,   Prolonged extention stretch for digits by OT - Rolling palm and hand over foam roller 20 reps without and with gentle pressure applied by OT proximal to PIP's  - to cont with  prior to his extention HEP Palm on table - PROM , gentle stretches 3 x 30 sec for MC and PIP extention , AROM extention of MC's and PIP's on paper on table Tapping of digits 5 reps each 10 reps each   Prior to tendon glides - knuckle bender splint for MC flexion stretch- 1-2 band on either side - 3-5 min - done in session and review with pt again Tendon glides -MC flexion with PIP extention, blocked intrinsic fist, composite flexion to 3cm foam roller 10 reps, and 2 cm foam block - only to do AROM not squeeze - causing pain in CT and medial epicondyle PROM for composite fist by OT - but pain under 2/10  Change HEP to wrist ext and flexion stretch over armrest - using R hand - 2 min x 2 each   AROM palms together RD,UD- great progress this date 28 and 30 degrees OT done AAROM for wrist flexion and composite extention for wrist and digits 10 reps   Keep pain under 2/10 isotoner glove daytime some  Silicon sleeve am and pm for 3 hrs each on 4th and 3rd digits for scar tissue on dorsal PIP's        OT Education - 05/06/20 1528    Education Details progress and changes to HEP    Person(s) Educated Patient    Methods Explanation;Demonstration;Tactile cues;Verbal cues;Handout    Comprehension  Verbal cues required;Returned demonstration;Verbalized understanding  OT Short Term Goals - 04/27/20 1323      OT SHORT TERM GOAL #1   Title Pt to be independent in HEP to decrease pain to less than 3/10 at the worse and AROM in digits and wrist to squeeze toothpaste, turning doorknob    Baseline decrease digits in flexion , extention see flowsheet- and wrist decrease -pain 6/10    Time 3    Period Weeks    Status New    Target Date 05/18/20      OT SHORT TERM GOAL #2   Title L hand digits extention inrease to WNL to get hand in pocket and gloves    Baseline decrease extention of PIP's -20 to -40 degrees    Time 3    Period Weeks    Status New    Target Date 05/18/20             OT Long Term Goals - 04/27/20 1326      OT LONG TERM GOAL #1   Title L hand digits flexion increase for pt to touch palm to hold utencils , steering wheel    Baseline flexion , extention decrease in all digits , pain 6/10 - wrist flexion , ext decrease    Time 6    Period Weeks    Status New    Target Date 06/08/20      OT LONG TERM GOAL #2   Title L wrist AROM and strength increase to same as R to push up from chair , turn doorknob,  carry 10 lbs without increase symptoms    Baseline pain 6/10 - decrease wrist AROM - and decrease strength    Time 6    Period Weeks    Status New    Target Date 06/08/20      OT LONG TERM GOAL #3   Title L grip strength and prehension increase to more than 50% compare to R hand and no increase symptoms    Baseline pain 6/10 -with gripping , making fist - cannot make fist - ext and flexion decrease in digits - see flowsheet- grip NT    Time 6    Period Weeks    Status New    Target Date 06/08/20      OT LONG TERM GOAL #4   Title PRWHE function and  pain score improve with more than 15 points    Baseline PRWHE for pain 25/5o and function 23/50 at eval    Time 6    Period Weeks    Status New    Target Date 06/08/20                  Plan - 05/06/20 1529    Clinical Impression Statement Pt is 12 wks s/p L thumb metacarpal fx with pinning, lacerations on dorsal PIP of 3rd and 4th digits- pt showed this week increase PIP flexion more than MC - edema still increase in hand and tightenss - PIP extention about the same - improved in session but then with using hand lose progress- will assess if need to do PIP quick cast for night time for 4th PIP    OT Occupational Profile and History Problem Focused Assessment - Including review of records relating to presenting problem    Occupational performance deficits (Please refer to evaluation for details): ADL's;IADL's;Work;Play;Leisure;Social Participation    Body Structure / Function / Physical Skills ADL;Decreased knowledge of precautions;Flexibility;ROM;UE functional use;Scar mobility;FMC;Dexterity;Edema;Pain;Strength;IADL    Rehab Potential Good  Clinical Decision Making Limited treatment options, no task modification necessary    Comorbidities Affecting Occupational Performance: None    Modification or Assistance to Complete Evaluation  No modification of tasks or assist necessary to complete eval    OT Frequency 2x / week    OT Duration 6 weeks    OT Treatment/Interventions Self-care/ADL training;Therapeutic exercise;Patient/family education;Splinting;Paraffin;Fluidtherapy;Contrast Bath;Passive range of motion;Manual Therapy;Scar mobilization    Plan assess progress with HEP    OT Home Exercise Plan see pt instruction    Consulted and Agree with Plan of Care Patient           Patient will benefit from skilled therapeutic intervention in order to improve the following deficits and impairments:   Body Structure / Function / Physical Skills: ADL, Decreased knowledge of precautions, Flexibility, ROM, UE functional use, Scar mobility, FMC, Dexterity, Edema, Pain, Strength, IADL       Visit Diagnosis: Stiffness of left hand, not elsewhere classified  Stiffness of left wrist,  not elsewhere classified  Scar tissue  Pain in left hand  Pain in left wrist  Muscle weakness (generalized)    Problem List Patient Active Problem List   Diagnosis Date Noted  . Benign prostatic hyperplasia with lower urinary tract symptoms 07/31/2019  . Elevated PSA 06/18/2018  . Vitamin D deficiency 01/18/2018  . Esophageal reflux 04/20/2014  . Hypertension 04/20/2014  . Sleep apnea 04/20/2014  . Dyslipidemia 04/07/2014  . Microalbuminuria 04/07/2014  . Obesity, unspecified 04/07/2014  . Hyperlipidemia due to type 2 diabetes mellitus ([place]) 04/07/2014  . 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 Type 2 diabetes mellitus with diabetic polyneuropathy, with long-term current use of insulin ([place]) 01/08/2014    Constancia Geeting OTR/l,CLT 05/06/2020, 4:23 PM  [facility] [address], [place], [address] Phone: [phone]   Fax:  [phone]  Name: Dylan Pittman MRN: [id number] Date of Birth: 01-Oct-1950

## 2020-05-11 ENCOUNTER — Other Ambulatory Visit: Payer: Self-pay

## 2020-05-11 ENCOUNTER — Ambulatory Visit: Payer: BC Managed Care – PPO | Admitting: Occupational Therapy

## 2020-05-11 DIAGNOSIS — M25642 Stiffness of left hand, not elsewhere classified: Secondary | ICD-10-CM

## 2020-05-11 DIAGNOSIS — M6281 Muscle weakness (generalized): Secondary | ICD-10-CM

## 2020-05-11 DIAGNOSIS — L905 Scar conditions and fibrosis of skin: Secondary | ICD-10-CM

## 2020-05-11 DIAGNOSIS — M79642 Pain in left hand: Secondary | ICD-10-CM

## 2020-05-11 DIAGNOSIS — M25632 Stiffness of left wrist, not elsewhere classified: Secondary | ICD-10-CM

## 2020-05-11 DIAGNOSIS — M25532 Pain in left wrist: Secondary | ICD-10-CM

## 2020-05-11 NOTE — Therapy (Signed)
[place] [facility] [address], [place], [address] Phone: [phone]   Fax:  [phone]  Occupational Therapy Treatment  Patient Details  Name: Dylan Pittman MRN: [id number] Date of Birth: [birth year] Referring Provider (OT): Dr Rudene Christians   Encounter Date: 05/11/2020   OT End of Session - 05/11/20 1119    Visit Number 5    Number of Visits 12    Date for OT Re-Evaluation 06/08/20    OT Start Time 1051    OT Stop Time 1135    OT Time Calculation (min) 44 min    Activity Tolerance Patient tolerated treatment well    Behavior During Therapy [facility] for tasks assessed/performed           Past Medical History:  Diagnosis Date  . Chronic airway obstruction ([place])   . DDD (degenerative disc disease), lumbar   . Dupuytren's disease   . Dyslipidemia 04/07/2014  . Esophageal reflux 04/20/2014  . Hypertension 04/20/2014  . Microalbuminuria 04/07/2014  . Microalbuminuria   . Obesity, unspecified 04/07/2014  . Sleep apnea 04/20/2014  . 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 BACK SURGERY  1992, 2005  . CLOSED REDUCTION FINGER WITH PERCUTANEOUS PINNING Left 02/12/2020   Procedure: Closed reduction and pinning of left first metacarpal fracture with possible open reduction;  Surgeon: Hessie Knows, MD;  Location: [facility] ORS;  Service: Orthopedics;  Laterality: Left;  . COLONOSCOPY WITH PROPOFOL N/A 12/28/2017   Procedure: COLONOSCOPY WITH PROPOFOL;  Surgeon: Manya Silvas, MD;  Location: [facility] ENDOSCOPY;  Service: Endoscopy;  Laterality: N/A;  . NASAL SEPTUM SURGERY    . VASECTOMY  2005    There were no vitals filed for this visit.   Subjective Assessment - 05/11/20 1115    Subjective  My hand is so stiff in the morning and Ioosen up when I do my exercises  - but then stiffen up again and get swollen -and hurt in my wrist - and the swelling  keeps on coming back over the knuckles and in the palm    Pertinent History Pt went to ER after sustaining left hand fracture 02/06/2020 patient was fixing a tire up with air, tire exploded and the rim hit his hand. He suffered a fracture along the base of the left first metacarpal at the base of the thumb as well as several lacerations throughout the left hand. Lacerations were repaired on 3rd and 4th dorsal PIP's except for the left palm this was left open to allow to heal. . Patient took antibiotics. Pt had thumb metacarpal pinning done 6/17 by Dr Rudene Christians , pins come out on 03/05/2020  He is L hand dominant. 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    Currently in Pain? Yes    Pain Score 4     Pain Location Hand    Pain Orientation Left    Pain Descriptors / Indicators Aching;Tightness;Stabbing    Pain Type Acute pain;Surgical pain  Pain Onset More than a month ago              [facility] OT Assessment - 05/11/20 0001      Strength   Right Hand Grip (lbs) 74    Right Hand Lateral Pinch 23 lbs    Right Hand 3 Point Pinch 14 lbs    Left Hand Grip (lbs) 28    Left Hand Lateral Pinch 15 lbs    Left Hand 3 Point Pinch 10 lbs      Left Hand AROM   L Index  MCP 0-90 68 Degrees    L Index PIP 0-100 80 Degrees    L Long  MCP 0-90 68 Degrees    L Long PIP 0-100 78 Degrees   -26   L Ring  MCP 0-90 68 Degrees    L Ring PIP 0-100 82 Degrees   -40   L Little  MCP 0-90 70 Degrees    L Little PIP 0-100 82 Degrees                    OT Treatments/Exercises (OP) - 05/11/20 0001      LUE Fluidotherapy   Number Minutes Fluidotherapy 11 Minutes    LUE Fluidotherapy  Location Hand;Wrist    Comments done with AROM fisting and ice 2 rotations for 1 min to decrease edema and stiffness             Soft tissue massageby OT - MC spreads , webspace massage with PA and RA stretches , mini massager on dorsal scars on 4th and 3rdand volar digits - soft tissue massage to lateral bands with gentle traction to digits for PIP  graston tool nr 2 sweeping and brushing on volar forearm , wrist , and hand to digits prior to ROM  Done this date also traction , and joint mobs to wrist and Carpal bones prior to wrist flexion , extention stretches - add to HEP - did increase with 5 and 10 degrees in session -but pt is tight in R wrist too- 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   Wife to [employer] do scar massage, wife to help with [facility] spreads, thumb webspace with PA and RA of thumb , massage to lateral bands of PIP, joint mobs to MC's , interlocking of digits ,   Prolonged extention stretch for digits by OT - Rolling palm and hand over foam roller 20 reps without and with gentle pressure applied by OT proximal to PIP's  - to cont withprior to his extention HEP Palm on table - PROM , gentle stretches 3 x 30 sec for MC and PIP extention , AROM extention of MC's and PIP's on paper on table Tapping of digits 5 reps each 10 reps each   Prior to tendon glides - applied gentle flexion stretch to MC 3 x 2 min or  knuckle bender splint for MC flexion stretch- 1-2 band on either side - 3-5 min- done in session and review with pt again Tendon glides -blocked intrinsic fist, composite flexion to 2 cm foam block- only to do AROM not squeeze - causing pain in CT and medial epicondyle PROM for composite fist by OT - but pain under 2/10  AAROM wrist ext and flexion stretch over armrest - using R hand - 2 min x 2 each           OT Education - 05/11/20 1119    Education Details progress and changes to  HEP    Person(s) Educated Patient    Methods  Explanation;Demonstration;Tactile cues;Verbal cues;Handout    Comprehension Verbal cues required;Returned demonstration;Verbalized understanding            OT Short Term Goals - 04/27/20 1323      OT SHORT TERM GOAL #1   Title Pt to be independent in HEP to decrease pain to less than 3/10 at the worse and AROM in digits and wrist to squeeze toothpaste, turning doorknob    Baseline decrease digits in flexion , extention see flowsheet- and wrist decrease -pain 6/10    Time 3    Period Weeks    Status New    Target Date 05/18/20      OT SHORT TERM GOAL #2   Title L hand digits extention inrease to WNL to get hand in pocket and gloves    Baseline decrease extention of PIP's -20 to -40 degrees    Time 3    Period Weeks    Status New    Target Date 05/18/20             OT Long Term Goals - 04/27/20 1326      OT LONG TERM GOAL #1   Title L hand digits flexion increase for pt to touch palm to hold utencils , steering wheel    Baseline flexion , extention decrease in all digits , pain 6/10 - wrist flexion , ext decrease    Time 6    Period Weeks    Status New    Target Date 06/08/20      OT LONG TERM GOAL #2   Title L wrist AROM and strength increase to same as R to push up from chair , turn doorknob,  carry 10 lbs without increase symptoms    Baseline pain 6/10 - decrease wrist AROM - and decrease strength    Time 6    Period Weeks    Status New    Target Date 06/08/20      OT LONG TERM GOAL #3   Title L grip strength and prehension increase to more than 50% compare to R hand and no increase symptoms    Baseline pain 6/10 -with gripping , making fist - cannot make fist - ext and flexion decrease in digits - see flowsheet- grip NT    Time 6    Period Weeks    Status New    Target Date 06/08/20      OT LONG TERM GOAL #4   Title PRWHE function and  pain score improve with more than 15 points    Baseline PRWHE for pain 25/5o and function 23/50 at eval    Time 6     Period Weeks    Status New    Target Date 06/08/20                 Plan - 05/11/20 1121    Clinical Impression Statement Pt is about 13 wks s/p L thumb MC fx wtih pinning and lacerations on dorsal PIP of 3rd and 4th - pt thumb appear to be [facility] compare to R - but cont to be limited in his progress by edema in hand mostly over MC's , increase stiffness and pain - did contact MD office to assess if pt can benefit from anything to help with inflammation and stiffness    OT Occupational Profile and History Problem Focused Assessment - Including review of records relating to presenting problem  Occupational performance deficits (Please refer to evaluation for details): ADL's;IADL's;Work;Play;Leisure;Social Participation    Body Structure / Function / Physical Skills ADL;Decreased knowledge of precautions;Flexibility;ROM;UE functional use;Scar mobility;FMC;Dexterity;Edema;Pain;Strength;IADL    Rehab Potential Good    Clinical Decision Making Limited treatment options, no task modification necessary    Comorbidities Affecting Occupational Performance: None    Modification or Assistance to Complete Evaluation  No modification of tasks or assist necessary to complete eval    OT Frequency 2x / week    OT Duration 6 weeks    OT Treatment/Interventions Self-care/ADL training;Therapeutic exercise;Patient/family education;Splinting;Paraffin;Fluidtherapy;Contrast Bath;Passive range of motion;Manual Therapy;Scar mobilization    Plan assess progress with HEP    OT Home Exercise Plan see pt instruction    Consulted and Agree with Plan of Care Patient           Patient will benefit from skilled therapeutic intervention in order to improve the following deficits and impairments:   Body Structure / Function / Physical Skills: ADL, Decreased knowledge of precautions, Flexibility, ROM, UE functional use, Scar mobility, FMC, Dexterity, Edema, Pain, Strength, IADL       Visit Diagnosis: Stiffness of  left hand, not elsewhere classified  Stiffness of left wrist, not elsewhere classified  Scar tissue  Pain in left hand  Pain in left wrist  Muscle weakness (generalized)    Problem List Patient Active Problem List   Diagnosis Date Noted  . Benign prostatic hyperplasia with lower urinary tract symptoms 07/31/2019  . Elevated PSA 06/18/2018  . Vitamin D deficiency 01/18/2018  . Esophageal reflux 04/20/2014  . Hypertension 04/20/2014  . Sleep apnea 04/20/2014  . Dyslipidemia 04/07/2014  . Microalbuminuria 04/07/2014  . Obesity, unspecified 04/07/2014  . Hyperlipidemia due to type 2 diabetes mellitus ([place]) 04/07/2014  . 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 Type 2 diabetes mellitus with diabetic polyneuropathy, with long-term current use of insulin ([place]) 01/08/2014    Rosalyn Gess OTR/L,CLT 05/11/2020, 3:54 PM  [place] [facility] [address] [place], [address] Phone: [phone]   Fax:  [phone]  Name: RAYNALDO FALCO MRN: [id number] Date of Birth: 1951-05-05

## 2020-05-13 ENCOUNTER — Other Ambulatory Visit: Payer: Self-pay

## 2020-05-13 ENCOUNTER — Ambulatory Visit: Payer: BC Managed Care – PPO | Admitting: Occupational Therapy

## 2020-05-13 DIAGNOSIS — M25642 Stiffness of left hand, not elsewhere classified: Secondary | ICD-10-CM

## 2020-05-13 DIAGNOSIS — M6281 Muscle weakness (generalized): Secondary | ICD-10-CM

## 2020-05-13 DIAGNOSIS — M25532 Pain in left wrist: Secondary | ICD-10-CM

## 2020-05-13 DIAGNOSIS — M79642 Pain in left hand: Secondary | ICD-10-CM

## 2020-05-13 DIAGNOSIS — M25632 Stiffness of left wrist, not elsewhere classified: Secondary | ICD-10-CM

## 2020-05-13 DIAGNOSIS — L905 Scar conditions and fibrosis of skin: Secondary | ICD-10-CM

## 2020-05-13 NOTE — Therapy (Signed)
[place] [facility] [address] [place], [address] Phone: [phone]   Fax:  [phone]  Occupational Therapy Treatment  Patient Details  Name: Dylan Pittman MRN: [id number] Date of Birth: [birth year] Referring Provider (OT): Dr Rudene Christians   Encounter Date: 05/13/2020   OT End of Session - 05/13/20 1200    Visit Number 6    Number of Visits 12    Date for OT Re-Evaluation 06/08/20    OT Start Time 1100    OT Stop Time 1141    OT Time Calculation (min) 41 min    Activity Tolerance Patient tolerated treatment well    Behavior During Therapy [facility] for tasks assessed/performed           Past Medical History:  Diagnosis Date  . Chronic airway obstruction ([place])   . DDD (degenerative disc disease), lumbar   . Dupuytren's disease   . Dyslipidemia 04/07/2014  . Esophageal reflux 04/20/2014  . Hypertension 04/20/2014  . Microalbuminuria 04/07/2014  . Microalbuminuria   . Obesity, unspecified 04/07/2014  . Sleep apnea 04/20/2014  . 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 BACK SURGERY  1992, 2005  . CLOSED REDUCTION FINGER WITH PERCUTANEOUS PINNING Left 02/12/2020   Procedure: Closed reduction and pinning of left first metacarpal fracture with possible open reduction;  Surgeon: Hessie Knows, MD;  Location: [facility] ORS;  Service: Orthopedics;  Laterality: Left;  . COLONOSCOPY WITH PROPOFOL N/A 12/28/2017   Procedure: COLONOSCOPY WITH PROPOFOL;  Surgeon: Manya Silvas, MD;  Location: [facility] ENDOSCOPY;  Service: Endoscopy;  Laterality: N/A;  . NASAL SEPTUM SURGERY    . VASECTOMY  2005    There were no vitals filed for this visit.   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 He suffered a fracture along the base of the left first metacarpal at the base of the thumb as well as several lacerations throughout the left hand. Lacerations were repaired on 3rd and 4th dorsal PIP's except for the left palm this was left open to allow to heal. . Patient took antibiotics. Pt had thumb metacarpal pinning done 6/17 by Dr Rudene Christians , pins come out on 03/05/2020  He is L hand dominant. 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    Currently in Pain? Yes    Pain Score 6     Pain Location Wrist    Pain Orientation Left    Pain Descriptors /  Indicators Aching;Tightness;Stabbing    Pain Type Acute pain;Surgical pain    Pain Onset More than a month ago    Pain Frequency Intermittent                        OT Treatments/Exercises (OP) - 05/13/20 0001      LUE Fluidotherapy   Number Minutes Fluidotherapy 11 Minutes    LUE Fluidotherapy Location Hand;Wrist    Comments done AROM fisting - but did  contrast inbetween            Soft tissue massageby OT - MC spreads , webspace massage with PA and RA stretches , mini massager on dorsal scars on 4th and 3rdand volar digits - soft tissue massage to lateral bands with gentle traction to digits for PIP  graston tool nr 2 sweeping and brushing on volar forearm , wrist , and hand to digits prior to ROM  Done this date also traction , and joint mobs  to wrist and Carpal bones prior to wrist flexion , extention stretches - add to HEP - did increase with 5 and 10 degrees in session -but pt is tight in R wrist too- 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 to [employer] do scar massage, wife to help with [facility] spreads, thumb webspace with PA and RA of thumb , massage to lateral bands of PIP, joint mobs to MC's , interlocking of digits ,   Prolonged extention stretch for digits by OT -Rolling palm and hand over foam roller 20 repswithout and with gentle pressure applied by OT proximal to PIP's- to cont withprior to his extention HEP Palm on table - PROM , gentle stretches 3 x 30 sec for MC and PIP extention  10 reps each    applied gentle flexion stretch to MC 3 x 2 min or  knuckle bender splint for MC flexion stretch- 1-2 band on either side - 3-5 min- done in session and review with pt again Tendon glides -blocked intrinsic fist, composite flexion to 2 cm foam block- only to do AROM not squeeze - causing pain in CT and medial epicondyle PROM for composite fist by OT - but pain under 2/10        OT Education - 05/13/20 1200    Education Details progress and changes to HEP    Person(s) Educated Patient    Methods Explanation;Demonstration;Tactile cues;Verbal cues;Handout    Comprehension Verbal cues required;Returned demonstration;Verbalized understanding            OT Short Term Goals - 04/27/20 1323      OT SHORT TERM GOAL #1   Title Pt to be independent in HEP to decrease pain to less than 3/10 at the worse and AROM in digits and wrist to squeeze toothpaste, turning doorknob    Baseline decrease digits in flexion , extention see flowsheet- and wrist decrease -pain 6/10    Time 3    Period Weeks    Status New    Target Date 05/18/20      OT SHORT TERM GOAL #2   Title L hand digits extention inrease to WNL to get hand in pocket and gloves    Baseline decrease extention of PIP's -20 to -40 degrees    Time 3     Period Weeks    Status New    Target Date 05/18/20             OT Long Term Goals -  04/27/20 1326      OT LONG TERM GOAL #1   Title L hand digits flexion increase for pt to touch palm to hold utencils , steering wheel    Baseline flexion , extention decrease in all digits , pain 6/10 - wrist flexion , ext decrease    Time 6    Period Weeks    Status New    Target Date 06/08/20      OT LONG TERM GOAL #2   Title L wrist AROM and strength increase to same as R to push up from chair , turn doorknob,  carry 10 lbs without increase symptoms    Baseline pain 6/10 - decrease wrist AROM - and decrease strength    Time 6    Period Weeks    Status New    Target Date 06/08/20      OT LONG TERM GOAL #3   Title L grip strength and prehension increase to more than 50% compare to R hand and no increase symptoms    Baseline pain 6/10 -with gripping , making fist - cannot make fist - ext and flexion decrease in digits - see flowsheet- grip NT    Time 6    Period Weeks    Status New    Target Date 06/08/20      OT LONG TERM GOAL #4   Title PRWHE function and  pain score improve with more than 15 points    Baseline PRWHE for pain 25/5o and function 23/50 at eval    Time 6    Period Weeks    Status New    Target Date 06/08/20                 Plan - 05/13/20 1203    Clinical Impression Statement Pt is 13 1/2 wks L thumb MC fx with pinning and lacerations on dorsal PIP of 3rd and 4th - cont to be limited in progress with edema , stiffness over digits , MC's , pain - pt on steroid pack since yesterday - will reassess in 4 days if improvement    OT Occupational Profile and History Problem Focused Assessment - Including review of records relating to presenting problem    Occupational performance deficits (Please refer to evaluation for details): ADL's;IADL's;Work;Play;Leisure;Social Participation    Body Structure / Function / Physical Skills ADL;Decreased knowledge of  precautions;Flexibility;ROM;UE functional use;Scar mobility;FMC;Dexterity;Edema;Pain;Strength;IADL    Rehab Potential Good    Clinical Decision Making Limited treatment options, no task modification necessary    Comorbidities Affecting Occupational Performance: None    Modification or Assistance to Complete Evaluation  No modification of tasks or assist necessary to complete eval    OT Frequency 2x / week    OT Duration 4 weeks    OT Treatment/Interventions Self-care/ADL training;Therapeutic exercise;Patient/family education;Splinting;Paraffin;Fluidtherapy;Contrast Bath;Passive range of motion;Manual Therapy;Scar mobilization    Plan assess progress with HEP    OT Home Exercise Plan see pt instruction    Consulted and Agree with Plan of Care Patient           Patient will benefit from skilled therapeutic intervention in order to improve the following deficits and impairments:   Body Structure / Function / Physical Skills: ADL, Decreased knowledge of precautions, Flexibility, ROM, UE functional use, Scar mobility, FMC, Dexterity, Edema, Pain, Strength, IADL       Visit Diagnosis: Stiffness of left hand, not elsewhere classified  Stiffness of left wrist, not elsewhere classified  Scar tissue  Pain  in left hand  Pain in left wrist  Muscle weakness (generalized)    Problem List Patient Active Problem List   Diagnosis Date Noted  . Benign prostatic hyperplasia with lower urinary tract symptoms 07/31/2019  . Elevated PSA 06/18/2018  . Vitamin D deficiency 01/18/2018  . Esophageal reflux 04/20/2014  . Hypertension 04/20/2014  . Sleep apnea 04/20/2014  . Dyslipidemia 04/07/2014  . Microalbuminuria 04/07/2014  . Obesity, unspecified 04/07/2014  . Hyperlipidemia due to type 2 diabetes mellitus ([place]) 04/07/2014  . 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 Type 2 diabetes mellitus with diabetic polyneuropathy, with long-term current use of  insulin ([place]) 01/08/2014    Shaleka Brines OTR/l,CLT 05/13/2020, 12:06 PM  [place] [facility] [address] [place], [address] Phone: [phone]   Fax:  [phone]  Name: VERN GUERETTE MRN: [id number] Date of Birth: Mar 19, 1951

## 2020-05-17 ENCOUNTER — Ambulatory Visit: Payer: BC Managed Care – PPO | Admitting: Occupational Therapy

## 2020-05-17 ENCOUNTER — Other Ambulatory Visit: Payer: Self-pay

## 2020-05-17 DIAGNOSIS — L905 Scar conditions and fibrosis of skin: Secondary | ICD-10-CM

## 2020-05-17 DIAGNOSIS — M79642 Pain in left hand: Secondary | ICD-10-CM

## 2020-05-17 DIAGNOSIS — M25642 Stiffness of left hand, not elsewhere classified: Secondary | ICD-10-CM

## 2020-05-17 DIAGNOSIS — M6281 Muscle weakness (generalized): Secondary | ICD-10-CM

## 2020-05-17 DIAGNOSIS — M25632 Stiffness of left wrist, not elsewhere classified: Secondary | ICD-10-CM

## 2020-05-17 DIAGNOSIS — M25532 Pain in left wrist: Secondary | ICD-10-CM

## 2020-05-17 NOTE — Therapy (Signed)
[place] [facility] [address], [place], [address] Phone: [phone]   Fax:  [phone]  Occupational Therapy Treatment  Patient Details  Name: Dylan Pittman MRN: [id number] Date of Birth: 1951-04-11 Referring Provider (OT): Dr Rudene Christians   Encounter Date: 05/17/2020   OT End of Session - 05/17/20 1037    Visit Number 7    Number of Visits 12    Date for OT Re-Evaluation 06/08/20    OT Start Time 1022    OT Stop Time 1101    OT Time Calculation (min) 39 min    Activity Tolerance Patient tolerated treatment well    Behavior During Therapy [facility] for tasks assessed/performed           Past Medical History:  Diagnosis Date  . Chronic airway obstruction ([place])   . DDD (degenerative disc disease), lumbar   . Dupuytren's disease   . Dyslipidemia 04/07/2014  . Esophageal reflux 04/20/2014  . Hypertension 04/20/2014  . Microalbuminuria 04/07/2014  . Microalbuminuria   . Obesity, unspecified 04/07/2014  . Sleep apnea 04/20/2014  . 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 BACK SURGERY  1992, 2005  . CLOSED REDUCTION FINGER WITH PERCUTANEOUS PINNING Left 02/12/2020   Procedure: Closed reduction and pinning of left first metacarpal fracture with possible open reduction;  Surgeon: Hessie Knows, MD;  Location: [facility] ORS;  Service: Orthopedics;  Laterality: Left;  . COLONOSCOPY WITH PROPOFOL N/A 12/28/2017   Procedure: COLONOSCOPY WITH PROPOFOL;  Surgeon: Manya Silvas, MD;  Location: [facility] ENDOSCOPY;  Service: Endoscopy;  Laterality: N/A;  . NASAL SEPTUM SURGERY    . VASECTOMY  2005    There were no vitals filed for this visit.   Subjective Assessment - 05/17/20 1035    Subjective  I finished my steroid yesterday - swelling come down and pain better- tried to use my hand little with tractor over the weekend but still cannot make tight  fist - wrist hurts at times    Pertinent History Pt went to ER after sustaining left hand fracture 02/06/2020 patient was fixing a tire up with air, tire exploded and the rim hit his hand. He suffered a fracture along the base of the left first metacarpal at the base of the thumb as well as several lacerations throughout the left hand. Lacerations were repaired on 3rd and 4th dorsal PIP's except for the left palm this was left open to allow to heal. . Patient took antibiotics. Pt had thumb metacarpal pinning done 6/17 by Dr Rudene Christians , pins come out on 03/05/2020  He is L hand dominant. 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    Currently in Pain? No/denies              [facility] OT Assessment - 05/17/20 0001      Left Hand AROM   L Index  MCP 0-90 65 Degrees    L Index PIP 0-100 75 Degrees  L Long  MCP 0-90 70 Degrees    L Long PIP 0-100 80 Degrees   -26   L Ring  MCP 0-90 65 Degrees    L Ring PIP 0-100 85 Degrees   -35   L Little  MCP 0-90 70 Degrees    L Little PIP 0-100 75 Degrees          AROM with more ease but still stiffness and measurements about same than prior to steroids   edema decrease compare to prior - but still over volar MC's in palm  ? Scar dept in PIP joint - ? [place] can be done  Cont to have volar wrist pain if digits flexion past end range of AROM for digits flexion - or if squeezing         OT Treatments/Exercises (OP) - 05/17/20 0001      LUE Fluidotherapy   Number Minutes Fluidotherapy 8 Minutes    LUE Fluidotherapy Location Hand;Wrist    Comments AROM for wrist and digits in all planes             Soft tissue massageby OT - MC  spreads , webspace massage with PA and RA stretches , mini massager on dorsal scars on 4th and 3rdand volar digits - soft tissue massage to lateral bands with gentle traction to digits for PIP  graston tool nr 2 sweeping and brushing on volar forearm , wrist , and hand to digits prior to ROM  Done this date also traction , and joint mobs to wrist and Carpal bones prior to wrist flexion , extention stretches - add to HEP - did increase with 5 and 10 degrees in session   Prolonged extention stretch for digits by OT -Rolling palm and hand over foam roller 20 repswithout and with gentle pressure applied by OT proximal to PIP's- to cont withprior to his extention HEP Palm on table - PROM , gentle stretches 3 x 30 sec for MC and PIP extention  10 reps each   applied gentle flexion stretch to MC 3 x 2 min orknuckle bender splint for MC flexion stretch- 1-2 band on either side - 3-5 min Tendon glides -blocked intrinsic fist, composite flexion to 2 cm foam block- only to do AROM not squeeze - causing pain in CT and medial epicondyle if squeezing         OT Education - 05/17/20 1037    Education Details progress and changes to HEP    Person(s) Educated Patient    Methods Explanation;Demonstration;Tactile cues;Verbal cues;Handout    Comprehension Verbal cues required;Returned demonstration;Verbalized understanding            OT Short Term Goals - 04/27/20 1323      OT SHORT TERM GOAL #1   Title Pt to be independent in HEP to decrease pain to less than 3/10 at the worse and AROM in digits and wrist to squeeze toothpaste, turning doorknob    Baseline decrease digits in flexion , extention see flowsheet- and wrist decrease -pain 6/10    Time 3    Period Weeks    Status New    Target Date 05/18/20      OT SHORT TERM GOAL #2   Title L hand digits extention inrease to WNL to get hand in pocket and gloves    Baseline decrease extention of PIP's -20 to -40 degrees    Time 3     Period Weeks    Status New  Target Date 05/18/20             OT Long Term Goals - 04/27/20 1326      OT LONG TERM GOAL #1   Title L hand digits flexion increase for pt to touch palm to hold utencils , steering wheel    Baseline flexion , extention decrease in all digits , pain 6/10 - wrist flexion , ext decrease    Time 6    Period Weeks    Status New    Target Date 06/08/20      OT LONG TERM GOAL #2   Title L wrist AROM and strength increase to same as R to push up from chair , turn doorknob,  carry 10 lbs without increase symptoms    Baseline pain 6/10 - decrease wrist AROM - and decrease strength    Time 6    Period Weeks    Status New    Target Date 06/08/20      OT LONG TERM GOAL #3   Title L grip strength and prehension increase to more than 50% compare to R hand and no increase symptoms    Baseline pain 6/10 -with gripping , making fist - cannot make fist - ext and flexion decrease in digits - see flowsheet- grip NT    Time 6    Period Weeks    Status New    Target Date 06/08/20      OT LONG TERM GOAL #4   Title PRWHE function and  pain score improve with more than 15 points    Baseline PRWHE for pain 25/5o and function 23/50 at eval    Time 6    Period Weeks    Status New    Target Date 06/08/20                 Plan - 05/17/20 1038    Clinical Impression Statement Pt is about 14 wks L thumb MC fx with pinning and lacerations on dorsal PIP of 3rd and 4th - pt reponded great with edema after steriods- but lacerations affecting motion at PIP's , 4th worse than L , pain volar wrist  from straining with digits flexion with stiffness , and still edema over volar MC's in palm - Pt to see Dr Candelaria Stagers tomorrow    OT Occupational Profile and History Problem Focused Assessment - Including review of records relating to presenting problem    Occupational performance deficits (Please refer to evaluation for details): ADL's;IADL's;Work;Play;Leisure;Social  Participation    Body Structure / Function / Physical Skills ADL;Decreased knowledge of precautions;Flexibility;ROM;UE functional use;Scar mobility;FMC;Dexterity;Edema;Pain;Strength;IADL    Rehab Potential Good    Clinical Decision Making Limited treatment options, no task modification necessary    Comorbidities Affecting Occupational Performance: None    Modification or Assistance to Complete Evaluation  No modification of tasks or assist necessary to complete eval    OT Frequency 2x / week    OT Duration 4 weeks    OT Treatment/Interventions Self-care/ADL training;Therapeutic exercise;Patient/family education;Splinting;Paraffin;Fluidtherapy;Contrast Bath;Passive range of motion;Manual Therapy;Scar mobilization    Plan assess progress with HEP    OT Home Exercise Plan see pt instruction    Consulted and Agree with Plan of Care Patient           Patient will benefit from skilled therapeutic intervention in order to improve the following deficits and impairments:   Body Structure / Function / Physical Skills: ADL, Decreased knowledge of precautions, Flexibility, ROM, UE functional use, Scar  mobility, [place], Dexterity, Edema, Pain, Strength, IADL       Visit Diagnosis: Stiffness of left wrist, not elsewhere classified  Stiffness of left hand, not elsewhere classified  Scar tissue  Pain in left hand  Pain in left wrist  Muscle weakness (generalized)    Problem List Patient Active Problem List   Diagnosis Date Noted  . Benign prostatic hyperplasia with lower urinary tract symptoms 07/31/2019  . Elevated PSA 06/18/2018  . Vitamin D deficiency 01/18/2018  . Esophageal reflux 04/20/2014  . Hypertension 04/20/2014  . Sleep apnea 04/20/2014  . Dyslipidemia 04/07/2014  . Microalbuminuria 04/07/2014  . Obesity, unspecified 04/07/2014  . Hyperlipidemia due to type 2 diabetes mellitus ([place]) 04/07/2014  . 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  Type 2 diabetes mellitus with diabetic polyneuropathy, with long-term current use of insulin ([place]) 01/08/2014    Rosalyn Gess OTR/L,CLT 05/17/2020, 12:04 PM  [place] [facility] [address], [place], [address] Phone: [phone]   Fax:  [phone]  Name: JAMAIL CULLERS MRN: [id number] Date of Birth: October 08, 1950

## 2020-05-20 ENCOUNTER — Ambulatory Visit: Payer: BC Managed Care – PPO | Admitting: Occupational Therapy

## 2020-05-20 ENCOUNTER — Other Ambulatory Visit: Payer: Self-pay

## 2020-05-20 DIAGNOSIS — M25532 Pain in left wrist: Secondary | ICD-10-CM

## 2020-05-20 DIAGNOSIS — M25642 Stiffness of left hand, not elsewhere classified: Secondary | ICD-10-CM

## 2020-05-20 DIAGNOSIS — M6281 Muscle weakness (generalized): Secondary | ICD-10-CM

## 2020-05-20 DIAGNOSIS — M25632 Stiffness of left wrist, not elsewhere classified: Secondary | ICD-10-CM

## 2020-05-20 DIAGNOSIS — L905 Scar conditions and fibrosis of skin: Secondary | ICD-10-CM

## 2020-05-20 DIAGNOSIS — M79642 Pain in left hand: Secondary | ICD-10-CM

## 2020-05-20 NOTE — Therapy (Signed)
[place] [facility] [address], [place], [address] Phone: [phone]   Fax:  [phone]  Occupational Therapy Treatment  Patient Details  Name: DARCELL SABINO MRN: [id number] Date of Birth: 05/26/1951 Referring Provider (OT): Dr Rudene Christians   Encounter Date: 05/20/2020   OT End of Session - 05/20/20 1038    Visit Number 8    Number of Visits 12    Date for OT Re-Evaluation 06/08/20    OT Start Time 1019    OT Stop Time 1111    OT Time Calculation (min) 52 min    Activity Tolerance Patient tolerated treatment well    Behavior During Therapy [facility] for tasks assessed/performed           Past Medical History:  Diagnosis Date  . Chronic airway obstruction ([place])   . DDD (degenerative disc disease), lumbar   . Dupuytren's disease   . Dyslipidemia 04/07/2014  . Esophageal reflux 04/20/2014  . Hypertension 04/20/2014  . Microalbuminuria 04/07/2014  . Microalbuminuria   . Obesity, unspecified 04/07/2014  . Sleep apnea 04/20/2014  . 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 BACK SURGERY  1992, 2005  . CLOSED REDUCTION FINGER WITH PERCUTANEOUS PINNING Left 02/12/2020   Procedure: Closed reduction and pinning of left first metacarpal fracture with possible open reduction;  Surgeon: Hessie Knows, MD;  Location: [facility] ORS;  Service: Orthopedics;  Laterality: Left;  . COLONOSCOPY WITH PROPOFOL N/A 12/28/2017   Procedure: COLONOSCOPY WITH PROPOFOL;  Surgeon: Manya Silvas, MD;  Location: [facility] ENDOSCOPY;  Service: Endoscopy;  Laterality: N/A;  . NASAL SEPTUM SURGERY    . VASECTOMY  2005    There were no vitals filed for this visit.   Subjective Assessment - 05/20/20 1022    Subjective  Seen Dr Candelaria Stagers seen me - not shot - maybe if need in future - wrist felt good this am - and stiffness little better - maybe need to see Dr Rudene Christians in 2 wks      Pertinent History Pt went to ER after sustaining left hand fracture 02/06/2020 patient was fixing a tire up with air, tire exploded and the rim hit his hand. He suffered a fracture along the base of the left first metacarpal at the base of the thumb as well as several lacerations throughout the left hand. Lacerations were repaired on 3rd and 4th dorsal PIP's except for the left palm this was left open to allow to heal. . Patient took antibiotics. Pt had thumb metacarpal pinning done 6/17 by Dr Rudene Christians , pins come out on 03/05/2020  He is L hand dominant. 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    Currently in Pain? Yes    Pain Score 2     Pain Location Hand    Pain Orientation Left    Pain Descriptors / Indicators Aching;Tightness   stiffness   Pain Type Surgical pain  [facility] OT Assessment - 05/20/20 0001      Strength   Right Hand Grip (lbs) 74    Right Hand Lateral Pinch 25 lbs    Right Hand 3 Point Pinch 26 lbs    Left Hand Grip (lbs) 34    Left Hand Lateral Pinch 18 lbs    Left Hand 3 Point Pinch 16 lbs      Left Hand AROM   L Index  MCP 0-90 65 Degrees    L Index PIP 0-100 80 Degrees    L Long  MCP 0-90 70 Degrees    L Long PIP 0-100 85 Degrees    L Ring  MCP 0-90 79 Degrees    L Ring PIP 0-100 90 Degrees    L Little  MCP 0-90 75 Degrees    L Little PIP 0-100 85 Degrees           measurement taken - see flow sheet - cont to make progress -digits extention and wrist AROM stayed steady         OT Treatments/Exercises (OP) - 05/20/20 0001      LUE Fluidotherapy   Number Minutes Fluidotherapy 11 Minutes    LUE Fluidotherapy Location  Hand;Wrist    Comments AROM in wrist and digits    2 cycles of ice inbetween           Soft tissue massageby OT - MC spreads , webspace massage with PA and RA stretches , mini massager on dorsal scars on 4th and 3rdand volar digits - soft tissue massage to lateral bands with gentle traction to digits for PIP  graston tool nr 2 sweeping and brushing on volar forearm , wrist , and hand to digits prior to ROM  Done this date also traction , and joint mobs to wrist and Carpal bones prior to wrist flexion , extention stretches    Prolonged extention stretch for digits by OT -Rolling palm and hand over foam roller 20 repswithout and with gentle pressure applied by OT proximal to PIP's- to cont withprior to his extention HEP Palm on table - PROM , gentle stretches 3 x 30 sec for MC and PIP extention  10 reps each   applied gentle flexion stretch to [facility]  Intrinsic fist AAROM by OT  Gentle PROM composite flexion to each digit  Followed by composite flexion to 2 cm cylinder object- only to do AROM no squeeze  Pt report wrist feeling better - using neoprene [place] on wrist with activities that require gripping        OT Education - 05/20/20 1038    Education Details progress and changes to HEP    Person(s) Educated Patient    Methods Explanation;Demonstration;Tactile cues;Verbal cues;Handout    Comprehension Verbal cues required;Returned demonstration;Verbalized understanding            OT Short Term Goals - 04/27/20 1323      OT SHORT TERM GOAL #1   Title Pt to be independent in HEP to decrease pain to less than 3/10 at the worse and AROM in digits and wrist to squeeze toothpaste, turning doorknob    Baseline decrease digits in flexion , extention see flowsheet- and wrist decrease -pain 6/10    Time 3    Period Weeks    Status New    Target Date 05/18/20      OT SHORT TERM GOAL #2   Title L hand digits extention inrease to WNL to get hand in pocket  and gloves     Baseline decrease extention of PIP's -20 to -40 degrees    Time 3    Period Weeks    Status New    Target Date 05/18/20             OT Long Term Goals - 04/27/20 1326      OT LONG TERM GOAL #1   Title L hand digits flexion increase for pt to touch palm to hold utencils , steering wheel    Baseline flexion , extention decrease in all digits , pain 6/10 - wrist flexion , ext decrease    Time 6    Period Weeks    Status New    Target Date 06/08/20      OT LONG TERM GOAL #2   Title L wrist AROM and strength increase to same as R to push up from chair , turn doorknob,  carry 10 lbs without increase symptoms    Baseline pain 6/10 - decrease wrist AROM - and decrease strength    Time 6    Period Weeks    Status New    Target Date 06/08/20      OT LONG TERM GOAL #3   Title L grip strength and prehension increase to more than 50% compare to R hand and no increase symptoms    Baseline pain 6/10 -with gripping , making fist - cannot make fist - ext and flexion decrease in digits - see flowsheet- grip NT    Time 6    Period Weeks    Status New    Target Date 06/08/20      OT LONG TERM GOAL #4   Title PRWHE function and  pain score improve with more than 15 points    Baseline PRWHE for pain 25/5o and function 23/50 at eval    Time 6    Period Weeks    Status New    Target Date 06/08/20                 Plan - 05/20/20 1039    Clinical Impression Statement Pt is 14 1/2 wks L thumb MC fx with pinning and lacerations on dorsal PIP of 3rd and 4th - pt did see Dr Candelaria Stagers - recommend to get Voltaren gel - pt ed on where to apply for pain at PIP's 3rd and 4th - and volar wrist and ulnar side of hand - pt did show increase grip and prhension strength - increase flexion- while wrist AROM and digits extention stayed steady - cont to increase motion and strength    OT Occupational Profile and History Problem Focused Assessment - Including review of records relating to presenting  problem    Occupational performance deficits (Please refer to evaluation for details): ADL's;IADL's;Work;Play;Leisure;Social Participation    Body Structure / Function / Physical Skills ADL;Decreased knowledge of precautions;Flexibility;ROM;UE functional use;Scar mobility;FMC;Dexterity;Edema;Pain;Strength;IADL    Rehab Potential Good    Clinical Decision Making Limited treatment options, no task modification necessary    Comorbidities Affecting Occupational Performance: None    Modification or Assistance to Complete Evaluation  No modification of tasks or assist necessary to complete eval    OT Frequency 2x / week    OT Duration 4 weeks    OT Treatment/Interventions Self-care/ADL training;Therapeutic exercise;Patient/family education;Splinting;Paraffin;Fluidtherapy;Contrast Bath;Passive range of motion;Manual Therapy;Scar mobilization    Plan assess progress with HEP    OT Home Exercise Plan see pt instruction    Consulted and Agree with Plan of  Care Patient           Patient will benefit from skilled therapeutic intervention in order to improve the following deficits and impairments:   Body Structure / Function / Physical Skills: ADL, Decreased knowledge of precautions, Flexibility, ROM, UE functional use, Scar mobility, FMC, Dexterity, Edema, Pain, Strength, IADL       Visit Diagnosis: Stiffness of left wrist, not elsewhere classified  Stiffness of left hand, not elsewhere classified  Scar tissue  Pain in left hand  Pain in left wrist  Muscle weakness (generalized)    Problem List Patient Active Problem List   Diagnosis Date Noted  . Benign prostatic hyperplasia with lower urinary tract symptoms 07/31/2019  . Elevated PSA 06/18/2018  . Vitamin D deficiency 01/18/2018  . Esophageal reflux 04/20/2014  . Hypertension 04/20/2014  . Sleep apnea 04/20/2014  . Dyslipidemia 04/07/2014  . Microalbuminuria 04/07/2014  . Obesity, unspecified 04/07/2014  . Hyperlipidemia due  to type 2 diabetes mellitus ([place]) 04/07/2014  . 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 Type 2 diabetes mellitus with diabetic polyneuropathy, with long-term current use of insulin ([place]) 01/08/2014    Natalyah Cummiskey OTR/L,CLT 05/20/2020, 12:40 PM  [place] [facility] [address] [place], [address] Phone: [phone]   Fax:  [phone]  Name: SOHAIL CAPRARO MRN: [id number] Date of Birth: 03-24-1951

## 2020-05-25 ENCOUNTER — Other Ambulatory Visit: Payer: Self-pay

## 2020-05-25 ENCOUNTER — Ambulatory Visit: Payer: BC Managed Care – PPO | Admitting: Occupational Therapy

## 2020-05-25 DIAGNOSIS — M25642 Stiffness of left hand, not elsewhere classified: Secondary | ICD-10-CM

## 2020-05-25 DIAGNOSIS — M6281 Muscle weakness (generalized): Secondary | ICD-10-CM

## 2020-05-25 DIAGNOSIS — M79642 Pain in left hand: Secondary | ICD-10-CM

## 2020-05-25 DIAGNOSIS — M25532 Pain in left wrist: Secondary | ICD-10-CM

## 2020-05-25 DIAGNOSIS — L905 Scar conditions and fibrosis of skin: Secondary | ICD-10-CM

## 2020-05-25 DIAGNOSIS — M25632 Stiffness of left wrist, not elsewhere classified: Secondary | ICD-10-CM

## 2020-05-25 NOTE — Therapy (Signed)
[facility] [address], [place], [address] Phone: [phone]   Fax:  [phone]  Occupational Therapy Treatment  Patient Details  Name: Dylan Pittman MRN: [id number] Date of Birth: [birth year] Referring Provider (OT): Dr Rudene Christians   Encounter Date: 05/25/2020   OT End of Session - 05/25/20 1412    Visit Number 9    Number of Visits 12    Date for OT Re-Evaluation 06/08/20    OT Start Time 0930    OT Stop Time 1011    OT Time Calculation (min) 41 min    Activity Tolerance Patient tolerated treatment well    Behavior During Therapy [facility] for tasks assessed/performed           Past Medical History:  Diagnosis Date  . Chronic airway obstruction ([place])   . DDD (degenerative disc disease), lumbar   . Dupuytren's disease   . Dyslipidemia 04/07/2014  . Esophageal reflux 04/20/2014  . Hypertension 04/20/2014  . Microalbuminuria 04/07/2014  . Microalbuminuria   . Obesity, unspecified 04/07/2014  . Sleep apnea 04/20/2014  . 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 BACK SURGERY  1992, 2005  . CLOSED REDUCTION FINGER WITH PERCUTANEOUS PINNING Left 02/12/2020   Procedure: Closed reduction and pinning of left first metacarpal fracture with possible open reduction;  Surgeon: Hessie Knows, MD;  Location: [facility] ORS;  Service: Orthopedics;  Laterality: Left;  . COLONOSCOPY WITH PROPOFOL N/A 12/28/2017   Procedure: COLONOSCOPY WITH PROPOFOL;  Surgeon: Manya Silvas, MD;  Location: [facility] ENDOSCOPY;  Service: Endoscopy;  Laterality: N/A;  . NASAL SEPTUM SURGERY    . VASECTOMY  2005    There were no vitals filed for this visit.   Subjective Assessment - 05/25/20 0949    Subjective  It is just dull ache - morning the worse because of stiffness - I take ibuprofen every 4 hrs - and pain mostly in wrist  because when making fist - my  fingers are so stiff and tight - cannot bend them more    Pertinent History Pt went to ER after sustaining left hand fracture 02/06/2020 patient was fixing a tire up with air, tire exploded and the rim hit his hand. He suffered a fracture along the base of the left first metacarpal at the base of the thumb as well as several lacerations throughout the left hand. Lacerations were repaired on 3rd and 4th dorsal PIP's except for the left palm this was left open to allow to heal. . Patient took antibiotics. Pt had thumb metacarpal pinning done 6/17 by Dr Rudene Christians , pins come out on 03/05/2020  He is L hand dominant. 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    Currently in Pain? Yes    Pain Score 4     Pain Location Hand    Pain Orientation Left    Pain Descriptors / Indicators Aching;Tightness    Pain Type Surgical pain;Acute pain    Pain  Onset More than a month ago    Aggravating Factors  making fist              [facility] OT Assessment - 05/25/20 0001      AROM   Left Wrist Extension 60 Degrees    Left Wrist Flexion 62 Degrees      Strength   Right Hand Grip (lbs) 74    Right Hand Lateral Pinch 25 lbs    Right Hand 3 Point Pinch 26 lbs    Left Hand Grip (lbs) 34    Left Hand Lateral Pinch 14 lbs    Left Hand 3 Point Pinch 13 lbs      Left Hand AROM   L Index  MCP 0-90 70 Degrees    L Index PIP 0-100 80 Degrees    L Long  MCP 0-90 70 Degrees    L Long PIP 0-100 75 Degrees    L Ring  MCP 0-90 65 Degrees    L Ring PIP 0-100 80 Degrees    L Little  MCP 0-90 65 Degrees    L Little PIP 0-100 80 Degrees           Grip same but prehension decrease in L han  AROM  decrease compare to last week -and decrease extention of 3rd and 4th PIP - about same as last week          OT Treatments/Exercises (OP) - 05/25/20 0001      LUE Fluidotherapy   Number Minutes Fluidotherapy 10 Minutes    LUE Fluidotherapy Location Hand;Wrist    Comments AROM for digits and wrist AROM            Soft tissue massageby OT - MC spreads , webspace massage with PA and RA stretches , mini massager on dorsal scars on 4th and 3rdand volar digits - soft tissue massage to lateral bands with gentle traction to digits for PIP  graston tool nr 2 sweeping and brushing on volar forearm , wrist , and hand to digits prior to ROM  Done this date also traction , and joint mobs to wrist and Carpal bones prior to wrist flexion , extention stretches    Prolonged extention stretch for digits by OT -Rolling palm and hand over foam roller 20 repswithout and with gentle pressure applied by OT proximal to PIP's- to cont withprior to his extention HEP Palm on table - PROM , gentle stretches 3 x 30 sec for MC and PIP extention  10 reps each   applied gentle flexion stretch to [facility]  Intrinsic fist AAROM by OT  Gentle PROM composite flexion to each digit  Followed by composite flexion to 2 cm cylinder object- only to do AROM no squeeze  Pt report " fingers feels so stiff over dorsal fingers and pulling them down - they cannot go more - and then palm and wrist hurts "         OT Education - 05/25/20 1411    Education Details progress and changes to HEP    Person(s) Educated Patient    Methods Explanation;Demonstration;Tactile cues;Verbal cues;Handout    Comprehension Verbal cues required;Returned demonstration;Verbalized understanding            OT Short Term Goals - 04/27/20 1323      OT SHORT TERM GOAL #1   Title Pt to be independent in HEP to decrease pain to less than 3/10 at the worse and AROM in digits and wrist to squeeze  toothpaste, turning doorknob     Baseline decrease digits in flexion , extention see flowsheet- and wrist decrease -pain 6/10    Time 3    Period Weeks    Status New    Target Date 05/18/20      OT SHORT TERM GOAL #2   Title L hand digits extention inrease to WNL to get hand in pocket and gloves    Baseline decrease extention of PIP's -20 to -40 degrees    Time 3    Period Weeks    Status New    Target Date 05/18/20             OT Long Term Goals - 04/27/20 1326      OT LONG TERM GOAL #1   Title L hand digits flexion increase for pt to touch palm to hold utencils , steering wheel    Baseline flexion , extention decrease in all digits , pain 6/10 - wrist flexion , ext decrease    Time 6    Period Weeks    Status New    Target Date 06/08/20      OT LONG TERM GOAL #2   Title L wrist AROM and strength increase to same as R to push up from chair , turn doorknob,  carry 10 lbs without increase symptoms    Baseline pain 6/10 - decrease wrist AROM - and decrease strength    Time 6    Period Weeks    Status New    Target Date 06/08/20      OT LONG TERM GOAL #3   Title L grip strength and prehension increase to more than 50% compare to R hand and no increase symptoms    Baseline pain 6/10 -with gripping , making fist - cannot make fist - ext and flexion decrease in digits - see flowsheet- grip NT    Time 6    Period Weeks    Status New    Target Date 06/08/20      OT LONG TERM GOAL #4   Title PRWHE function and  pain score improve with more than 15 points    Baseline PRWHE for pain 25/5o and function 23/50 at eval    Time 6    Period Weeks    Status New    Target Date 06/08/20                 Plan - 05/25/20 1412    Clinical Impression Statement Pt is 15 wks s/p L thumb MC fx with pinning and lacerations to dorsal PIP of 3rd and 4th - pt stiffness and edema mostly in MC's and then 3rd and 4th PIP - causing pain wiht flexion in volar wrist - pt show decrease AROM coming in compare to last week -  in session able to get AROM back what he had last week - but unable to progress on that - contacted MD again for assistance    OT Occupational Profile and History Problem Focused Assessment - Including review of records relating to presenting problem    Occupational performance deficits (Please refer to evaluation for details): ADL's;IADL's;Work;Play;Leisure;Social Participation    Body Structure / Function / Physical Skills ADL;Decreased knowledge of precautions;Flexibility;ROM;UE functional use;Scar mobility;FMC;Dexterity;Edema;Pain;Strength;IADL    Rehab Potential Good    Clinical Decision Making Limited treatment options, no task modification necessary    Comorbidities Affecting Occupational Performance: None    Modification or Assistance to Complete Evaluation  No modification of tasks  or assist necessary to complete eval    OT Frequency 2x / week    OT Duration 4 weeks    OT Treatment/Interventions Self-care/ADL training;Therapeutic exercise;Patient/family education;Splinting;Paraffin;Fluidtherapy;Contrast Bath;Passive range of motion;Manual Therapy;Scar mobilization    Plan assess progress with HEP    OT Home Exercise Plan see pt instruction           Patient will benefit from skilled therapeutic intervention in order to improve the following deficits and impairments:   Body Structure / Function / Physical Skills: ADL, Decreased knowledge of precautions, Flexibility, ROM, UE functional use, Scar mobility, FMC, Dexterity, Edema, Pain, Strength, IADL       Visit Diagnosis: Stiffness of left wrist, not elsewhere classified  Stiffness of left hand, not elsewhere classified  Scar tissue  Pain in left hand  Pain in left wrist  Muscle weakness (generalized)    Problem List Patient Active Problem List   Diagnosis Date Noted  . Benign prostatic hyperplasia with lower urinary tract symptoms 07/31/2019  . Elevated PSA 06/18/2018  . Vitamin D deficiency 01/18/2018  .  Esophageal reflux 04/20/2014  . Hypertension 04/20/2014  . Sleep apnea 04/20/2014  . Dyslipidemia 04/07/2014  . Microalbuminuria 04/07/2014  . Obesity, unspecified 04/07/2014  . Hyperlipidemia due to type 2 diabetes mellitus ([place]) 04/07/2014  . 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 Type 2 diabetes mellitus with diabetic polyneuropathy, with long-term current use of insulin ([place]) 01/08/2014    Yussef Jorge OTR/L,CLT 05/25/2020, 2:15 PM  [place] [address], [place], [address] Phone: [phone]   Fax:  [phone]  Name: Dylan Pittman MRN: [id number] Date of Birth: Dec 30, 1950

## 2020-05-27 ENCOUNTER — Ambulatory Visit: Payer: BC Managed Care – PPO | Admitting: Occupational Therapy

## 2020-05-27 ENCOUNTER — Other Ambulatory Visit: Payer: Self-pay

## 2020-05-27 DIAGNOSIS — M25632 Stiffness of left wrist, not elsewhere classified: Secondary | ICD-10-CM

## 2020-05-27 DIAGNOSIS — M25532 Pain in left wrist: Secondary | ICD-10-CM

## 2020-05-27 DIAGNOSIS — M25642 Stiffness of left hand, not elsewhere classified: Secondary | ICD-10-CM | POA: Diagnosis not present

## 2020-05-27 DIAGNOSIS — M6281 Muscle weakness (generalized): Secondary | ICD-10-CM

## 2020-05-27 DIAGNOSIS — L905 Scar conditions and fibrosis of skin: Secondary | ICD-10-CM

## 2020-05-27 DIAGNOSIS — M79642 Pain in left hand: Secondary | ICD-10-CM

## 2020-05-27 NOTE — Therapy (Signed)
[facility] [address], [place], [address] Phone: [phone]   Fax:  [phone]  Occupational Therapy Treatment  Patient Details  Name: Dylan Pittman MRN: [id number] Date of Birth: 01-Mar-1951 Referring Provider (OT): Dr Rudene Christians   Encounter Date: 05/27/2020   OT End of Session - 05/27/20 1001    Visit Number 10    Number of Visits 12    Date for OT Re-Evaluation 06/08/20    OT Start Time 0917    OT Stop Time 0955    OT Time Calculation (min) 38 min    Activity Tolerance Patient tolerated treatment well    Behavior During Therapy [facility] for tasks assessed/performed           Past Medical History:  Diagnosis Date  . Chronic airway obstruction ([place])   . DDD (degenerative disc disease), lumbar   . Dupuytren's disease   . Dyslipidemia 04/07/2014  . Esophageal reflux 04/20/2014  . Hypertension 04/20/2014  . Microalbuminuria 04/07/2014  . Microalbuminuria   . Obesity, unspecified 04/07/2014  . Sleep apnea 04/20/2014  . 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 BACK SURGERY  1992, 2005  . CLOSED REDUCTION FINGER WITH PERCUTANEOUS PINNING Left 02/12/2020   Procedure: Closed reduction and pinning of left first metacarpal fracture with possible open reduction;  Surgeon: Hessie Knows, MD;  Location: [facility] ORS;  Service: Orthopedics;  Laterality: Left;  . COLONOSCOPY WITH PROPOFOL N/A 12/28/2017   Procedure: COLONOSCOPY WITH PROPOFOL;  Surgeon: Manya Silvas, MD;  Location: [facility] ENDOSCOPY;  Service: Endoscopy;  Laterality: N/A;  . NASAL SEPTUM SURGERY    . VASECTOMY  2005    There were no vitals filed for this visit.   Subjective Assessment - 05/27/20 0958    Subjective  It feels so stiff and tight - and swollen  - morning the worse - did try and play the mandorin the other day -and pain when I exercise my hand - they  extended for me to work the month of October also from home    Pertinent History Pt went to ER after sustaining left hand fracture 02/06/2020 patient was fixing a tire up with air, tire exploded and the rim hit his hand. He suffered a fracture along the base of the left first metacarpal at the base of the thumb as well as several lacerations throughout the left hand. Lacerations were repaired on 3rd and 4th dorsal PIP's except for the left palm this was left open to allow to heal. . Patient took antibiotics. Pt had thumb metacarpal pinning done 6/17 by Dr Rudene Christians , pins come out on 03/05/2020  He is L hand dominant. 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    Currently in Pain? Yes    Pain Score 6     Pain Location Hand    Pain Orientation Right;Left    Pain Descriptors / Indicators Aching;Tightness;Tender    Pain Type Surgical pain;Chronic pain  Pain Onset More than a month ago    Pain Frequency Intermittent    Aggravating Factors  making fist - or after doing his exercises or trying to use them              [facility] OT Assessment - 05/27/20 0001      AROM   Left Wrist Extension 60 Degrees    Left Wrist Flexion 55 Degrees      Strength   Right Hand Grip (lbs) 74    Right Hand Lateral Pinch 24 lbs    Right Hand 3 Point Pinch 26 lbs    Left Hand Grip (lbs) 34    Left Hand Lateral Pinch 14 lbs    Left Hand 3 Point Pinch 13 lbs      Left Hand AROM   L Index  MCP 0-90 60 Degrees    L Index PIP 0-100 80 Degrees    L Long  MCP 0-90 75 Degrees    L Long PIP 0-100 85 Degrees   -35   L Ring  MCP 0-90 60 Degrees    L Ring PIP 0-100 80 Degrees   -28   L Little  MCP 0-90  65 Degrees    L Little PIP 0-100 80 Degrees          Pt show decrease AROM compare to prior to steroid in MC's of hand - PIP's about same- increase in session but unable to maintain -and cannot progress pt further Stiffness, edema and pain limiting pt - mostly in PIP's of 3rd and 4th and over MC's  Because of stiffness - and straining digits flexors - has increase pain at wrist with fisting and flexion  Wrist flexion decrease the last 2 sessions           OT Treatments/Exercises (OP) - 05/27/20 0001      LUE Paraffin   Number Minutes Paraffin 8 Minutes    LUE Paraffin Location Hand;Wrist    Comments prior to soft tissue and AAROM            pain decrease after paraffin - pt to get one and use 3 x day - and do not force motion - PROM or stretches - focus on AAROM for digits and wrist    Soft tissue massageby OT - MC spreads , webspace massage with PA and RA stretches , mini massager on dorsal scars on 4th and 3rdand volar digits - soft tissue massage to lateral bands with gentle traction to digits for PIP  graston tool nr 2 sweeping and brushing on volar forearm , wrist , and hand to digits prior to ROM  Done this date also traction , and joint mobs to wrist and Carpal bones prior to wrist flexion , extention stretches   Rolling palm and hand over foam roller 20 reps for extention of MC and PIP's  Pt doing at home  gentle pressure applied by OT proximal to PIP's- to cont withprior to his extention HEP Palm on table - PROM , gentle stretches 3 x 30 sec for MC and PIP extention  10 reps each   AAROM for  flexion stretch to [facility] Intrinsic fist AAROM by OT and pt to do Gentle AAROM composite flexion all digits to palm and place and hold  Followed bycomposite flexion to 2 cmcylinder object- only to do AROM no squeeze Pt report " fingers feels so stiff over dorsal fingers and pulling them down , and thick feeling in  knuckles"   - they cannot go more - and  then palm and wrist hurts "  AAROM review with pt for wrist flexion , extention - 15 reps         OT Education - 05/27/20 1000    Education Details findings of AROM and strength assess - HEP    Person(s) Educated Patient    Methods Explanation;Demonstration;Tactile cues;Verbal cues;Handout    Comprehension Verbal cues required;Returned demonstration;Verbalized understanding            OT Short Term Goals - 05/27/20 1004      OT SHORT TERM GOAL #1   Title Pt to be independent in HEP to decrease pain to less than 3/10 at the worse and AROM in digits and wrist to squeeze toothpaste, turning doorknob    Baseline decrease digits in flexion , extention see flowsheet- and wrist decrease -pain cont to limit pt - can increase still to 6/10    Time 3    Period Weeks    Status On-going    Target Date 06/08/20      OT SHORT TERM GOAL #2   Title L hand digits extention inrease to WNL to get hand in pocket and gloves    Baseline extention of PIP's -28 to --35 degrees - progress some - but not getting worse the last 3-4 wks    Time 3    Period Weeks    Status On-going    Target Date 06/08/20             OT Long Term Goals - 05/27/20 1006      OT LONG TERM GOAL #1   Title L hand digits flexion increase for pt to touch palm to hold utencils , steering wheel    Baseline cannot maintain progress    Time 2    Period Weeks    Status On-going    Target Date 06/08/20      OT LONG TERM GOAL #2   Title L wrist AROM and strength increase to same as R to push up from chair , turn doorknob,  carry 10 lbs without increase symptoms    Baseline Pt can push up , and turndoorknob - but not gripping or carry anything with weight - show decrease wrist flexion -cont pain at wrist because of stiffness in digits    Time 2    Period Weeks    Status On-going    Target Date 06/08/20      OT LONG TERM GOAL #3   Title L grip strength and prehension increase to more than 50% compare to R hand and  no increase symptoms    Baseline pain 6/10 -with gripping , making fist - cannot make fist - ext and flexion decrease in digits - see flowsheet- grip increased - but decrease again the last week    Time 2    Period Weeks    Status On-going    Target Date 06/08/20      OT LONG TERM GOAL #4   Title PRWHE function and  pain score improve with more than 15 points    Baseline PRWHE for pain 25/5o and function 23/50 at eval - assess next time    Time 2    Period Weeks    Status On-going    Target Date 06/08/20                 Plan - 05/27/20 1001    Clinical Impression Statement Pt  is now 16 wks s/p L thumb MC fx with pinning - and had lacerations to dorsal PIP of 3rd and 4th - pt progress at [facility] but not making progress the last 3-4 wks - progress in session but then loose what he gained - some progress last week after bening on steriod for week -but stiffness  and swelling returned - recommend for pt to get paraffin bath for home to use 3 x day -and to do AAROM - not force PROM - AAROM for MC flexion , intrinsic fist and compostie fist - and then AAROM for wrist flexion ,extention - pt to follow up with orthro again for intervention to assist with edema and stiffness    OT Occupational Profile and History Problem Focused Assessment - Including review of records relating to presenting problem    Occupational performance deficits (Please refer to evaluation for details): ADL's;IADL's;Work;Play;Leisure;Social Participation    Body Structure / Function / Physical Skills ADL;Decreased knowledge of precautions;Flexibility;ROM;UE functional use;Scar mobility;FMC;Dexterity;Edema;Pain;Strength;IADL    Rehab Potential Good    Clinical Decision Making Limited treatment options, no task modification necessary    Comorbidities Affecting Occupational Performance: None    Modification or Assistance to Complete Evaluation  No modification of tasks or assist necessary to complete eval    OT Frequency 2x /  week    OT Duration 4 weeks    OT Treatment/Interventions Self-care/ADL training;Therapeutic exercise;Patient/family education;Splinting;Paraffin;Fluidtherapy;Contrast Bath;Passive range of motion;Manual Therapy;Scar mobilization    Plan assess progress with HEP    OT Home Exercise Plan see pt instruction    Consulted and Agree with Plan of Care Patient           Patient will benefit from skilled therapeutic intervention in order to improve the following deficits and impairments:   Body Structure / Function / Physical Skills: ADL, Decreased knowledge of precautions, Flexibility, ROM, UE functional use, Scar mobility, FMC, Dexterity, Edema, Pain, Strength, IADL       Visit Diagnosis: Stiffness of left wrist, not elsewhere classified  Stiffness of left hand, not elsewhere classified  Scar tissue  Pain in left hand  Pain in left wrist  Muscle weakness (generalized)    Problem List Patient Active Problem List   Diagnosis Date Noted  . Benign prostatic hyperplasia with lower urinary tract symptoms 07/31/2019  . Elevated PSA 06/18/2018  . Vitamin D deficiency 01/18/2018  . Esophageal reflux 04/20/2014  . Hypertension 04/20/2014  . Sleep apnea 04/20/2014  . Dyslipidemia 04/07/2014  . Microalbuminuria 04/07/2014  . Obesity, unspecified 04/07/2014  . Hyperlipidemia due to type 2 diabetes mellitus ([place]) 04/07/2014  . 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 Type 2 diabetes mellitus with diabetic polyneuropathy, with long-term current use of insulin ([place]) 01/08/2014    Vonne Mcdanel OTR/L,CLT 05/27/2020, 10:09 AM  [facility] [address] [place], [address] Phone: [phone]   Fax:  [phone]  Name: Dylan Pittman MRN: [id number] Date of Birth: Jul 23, 1951

## 2020-06-01 ENCOUNTER — Ambulatory Visit: Payer: BC Managed Care – PPO | Attending: Orthopedic Surgery | Admitting: Occupational Therapy

## 2020-06-01 ENCOUNTER — Other Ambulatory Visit: Payer: Self-pay

## 2020-06-01 DIAGNOSIS — M6281 Muscle weakness (generalized): Secondary | ICD-10-CM | POA: Insufficient documentation

## 2020-06-01 DIAGNOSIS — L905 Scar conditions and fibrosis of skin: Secondary | ICD-10-CM | POA: Diagnosis present

## 2020-06-01 DIAGNOSIS — M25532 Pain in left wrist: Secondary | ICD-10-CM

## 2020-06-01 DIAGNOSIS — M25632 Stiffness of left wrist, not elsewhere classified: Secondary | ICD-10-CM | POA: Diagnosis present

## 2020-06-01 DIAGNOSIS — M79642 Pain in left hand: Secondary | ICD-10-CM | POA: Insufficient documentation

## 2020-06-01 DIAGNOSIS — M25642 Stiffness of left hand, not elsewhere classified: Secondary | ICD-10-CM

## 2020-06-01 NOTE — Therapy (Signed)
[place] [facility] [address] [place], [address] Phone: [phone]   Fax:  [phone]  Occupational Therapy Treatment  Patient Details  Name: Dylan Pittman MRN: [id number] Date of Birth: Nov 13, 1950 Referring Provider (OT): Dr Rudene Christians   Encounter Date: 06/01/2020   OT End of Session - 06/01/20 1020    Visit Number 11    Number of Visits 12    Date for OT Re-Evaluation 06/08/20    OT Start Time 1020    OT Stop Time 1058    OT Time Calculation (min) 38 min    Activity Tolerance Patient tolerated treatment well    Behavior During Therapy [facility] for tasks assessed/performed           Past Medical History:  Diagnosis Date  . Chronic airway obstruction ([place])   . DDD (degenerative disc disease), lumbar   . Dupuytren's disease   . Dyslipidemia 04/07/2014  . Esophageal reflux 04/20/2014  . Hypertension 04/20/2014  . Microalbuminuria 04/07/2014  . Microalbuminuria   . Obesity, unspecified 04/07/2014  . Sleep apnea 04/20/2014  . 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 BACK SURGERY  1992, 2005  . CLOSED REDUCTION FINGER WITH PERCUTANEOUS PINNING Left 02/12/2020   Procedure: Closed reduction and pinning of left first metacarpal fracture with possible open reduction;  Surgeon: Hessie Knows, MD;  Location: [facility] ORS;  Service: Orthopedics;  Laterality: Left;  . COLONOSCOPY WITH PROPOFOL N/A 12/28/2017   Procedure: COLONOSCOPY WITH PROPOFOL;  Surgeon: Manya Silvas, MD;  Location: [facility] ENDOSCOPY;  Service: Endoscopy;  Laterality: N/A;  . NASAL SEPTUM SURGERY    . VASECTOMY  2005    There were no vitals filed for this visit.   Subjective Assessment - 06/01/20 1020    Subjective  Did not see Gerald Stabs - but they call me another round of steroids - I can tell pain better but even some of my other joints are better - pain better , some at  the wrist - and stiffness fingers more if I force it    Pertinent History Pt went to ER after sustaining left hand fracture 02/06/2020 patient was fixing a tire up with air, tire exploded and the rim hit his hand. He suffered a fracture along the base of the left first metacarpal at the base of the thumb as well as several lacerations throughout the left hand. Lacerations were repaired on 3rd and 4th dorsal PIP's except for the left palm this was left open to allow to heal. . Patient took antibiotics. Pt had thumb metacarpal pinning done 6/17 by Dr Rudene Christians , pins come out on 03/05/2020  He is L hand dominant. 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    Currently in Pain? Yes    Pain Score 2     Pain Location Wrist    Pain Orientation Left    Pain Descriptors / Indicators Aching    Pain Type Chronic pain;Acute pain  Pain Onset More than a month ago              [facility] OT Assessment - 06/01/20 0001      Left Hand AROM   L Index  MCP 0-90 70 Degrees    L Index PIP 0-100 80 Degrees    L Long  MCP 0-90 70 Degrees    L Long PIP 0-100 86 Degrees   -35   L Ring  MCP 0-90 65 Degrees    L Ring PIP 0-100 90 Degrees   -28   L Little  MCP 0-90 70 Degrees    L Little PIP 0-100 85 Degrees           decrease stiffness but still decrease MC flexion more than PIP 's  Less pain per pt since starting this past Friday 2nd round of 6 days steroid pack         OT Treatments/Exercises (OP) - 06/01/20 0001      LUE Contrast Bath   Time 8 minutes    Comments prior to splint use and ROM              Knuckle bender splint on for 3 min with 2 rubber bands on either  side Pt to do at home  AROM in knuckle bender - flexion but not touching bar in palm -needs to be pain free - had pain at volar wrist with attempts to touch bar in palm- extend PIP's  Removed and Intrinsic fist AAROM by OT - interlocking Composite fisting to 2 cm foam block and then finger out of palm  Pain free reinforce  To not force flexion to palm if pain      AAROM review with pt for wrist flexion , extention - 15 reps          OT Education - 06/01/20 1019    Education Details progress and HEP    Person(s) Educated Patient    Methods Explanation;Demonstration;Tactile cues;Verbal cues;Handout    Comprehension Verbal cues required;Returned demonstration;Verbalized understanding            OT Short Term Goals - 05/27/20 1004      OT SHORT TERM GOAL #1   Title Pt to be independent in HEP to decrease pain to less than 3/10 at the worse and AROM in digits and wrist to squeeze toothpaste, turning doorknob    Baseline decrease digits in flexion , extention see flowsheet- and wrist decrease -pain cont to limit pt - can increase still to 6/10    Time 3    Period Weeks    Status On-going    Target Date 06/08/20      OT SHORT TERM GOAL #2   Title L hand digits extention inrease to WNL to get hand in pocket and gloves    Baseline extention of PIP's -28 to --35 degrees - progress some - but not getting worse the last 3-4 wks    Time 3    Period Weeks    Status On-going    Target Date 06/08/20             OT Long Term Goals - 05/27/20 1006      OT LONG TERM GOAL #1   Title L hand digits flexion increase for pt to touch palm to hold utencils , steering wheel    Baseline cannot maintain progress    Time 2    Period Weeks    Status On-going  Target Date 06/08/20      OT LONG TERM GOAL #2   Title L wrist AROM and strength increase to same as R to push up from chair , turn doorknob,  carry 10 lbs without increase symptoms    Baseline Pt can push up , and  turndoorknob - but not gripping or carry anything with weight - show decrease wrist flexion -cont pain at wrist because of stiffness in digits    Time 2    Period Weeks    Status On-going    Target Date 06/08/20      OT LONG TERM GOAL #3   Title L grip strength and prehension increase to more than 50% compare to R hand and no increase symptoms    Baseline pain 6/10 -with gripping , making fist - cannot make fist - ext and flexion decrease in digits - see flowsheet- grip increased - but decrease again the last week    Time 2    Period Weeks    Status On-going    Target Date 06/08/20      OT LONG TERM GOAL #4   Title PRWHE function and  pain score improve with more than 15 points    Baseline PRWHE for pain 25/5o and function 23/50 at eval - assess next time    Time 2    Period Weeks    Status On-going    Target Date 06/08/20                 Plan - 06/01/20 1020    Clinical Impression Statement Pt about 17 wks s/p L thumb MC fx with pinning and had lacerations to dorsal PIP of 3rd and 4th - pt on 2nd round of steroids since Friday - report less pain and stiffness -but still tight at [facility] flexion and PIP flexion - need about 5-10 more of motion at each joint - pt to start back using Knuckle bender splint 3-5 min prior to AROM for prolonged stretch for MC and then AROM for PIP in splint -and then pain free AROM to 2cm out of palm    OT Occupational Profile and History Problem Focused Assessment - Including review of records relating to presenting problem    Occupational performance deficits (Please refer to evaluation for details): ADL's;IADL's;Work;Play;Leisure;Social Participation    Body Structure / Function / Physical Skills ADL;Decreased knowledge of precautions;Flexibility;ROM;UE functional use;Scar mobility;FMC;Dexterity;Edema;Pain;Strength;IADL    Rehab Potential Good    Clinical Decision Making Limited treatment options, no task modification necessary    Comorbidities  Affecting Occupational Performance: None    Modification or Assistance to Complete Evaluation  No modification of tasks or assist necessary to complete eval    OT Frequency 2x / week    OT Duration 4 weeks    OT Treatment/Interventions Self-care/ADL training;Therapeutic exercise;Patient/family education;Splinting;Paraffin;Fluidtherapy;Contrast Bath;Passive range of motion;Manual Therapy;Scar mobilization    Plan assess progress with HEP    OT Home Exercise Plan see pt instruction    Consulted and Agree with Plan of Care Patient           Patient will benefit from skilled therapeutic intervention in order to improve the following deficits and impairments:   Body Structure / Function / Physical Skills: ADL, Decreased knowledge of precautions, Flexibility, ROM, UE functional use, Scar mobility, FMC, Dexterity, Edema, Pain, Strength, IADL       Visit Diagnosis: Stiffness of left wrist, not elsewhere classified  Stiffness of left hand, not elsewhere classified  Scar tissue  Pain in left hand  Pain in left wrist  Muscle weakness (generalized)    Problem List Patient Active Problem List   Diagnosis Date Noted  . Benign prostatic hyperplasia with lower urinary tract symptoms 07/31/2019  . Elevated PSA 06/18/2018  . Vitamin D deficiency 01/18/2018  . Esophageal reflux 04/20/2014  . Hypertension 04/20/2014  . Sleep apnea 04/20/2014  . Dyslipidemia 04/07/2014  . Microalbuminuria 04/07/2014  . Obesity, unspecified 04/07/2014  . Hyperlipidemia due to type 2 diabetes mellitus ([place]) 04/07/2014  . 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 Type 2 diabetes mellitus with diabetic polyneuropathy, with long-term current use of insulin ([place]) 01/08/2014    Kristian Hazzard OTR/L,CLT 06/01/2020, 1:54 PM  [place] [facility] [address] [place], [address] Phone: [phone]   Fax:   [phone]  Name: Dylan Pittman MRN: [id number] Date of Birth: 08-Nov-1950

## 2020-06-03 ENCOUNTER — Other Ambulatory Visit: Payer: Self-pay

## 2020-06-03 ENCOUNTER — Ambulatory Visit: Payer: BC Managed Care – PPO | Admitting: Occupational Therapy

## 2020-06-03 DIAGNOSIS — L905 Scar conditions and fibrosis of skin: Secondary | ICD-10-CM

## 2020-06-03 DIAGNOSIS — M79642 Pain in left hand: Secondary | ICD-10-CM

## 2020-06-03 DIAGNOSIS — M6281 Muscle weakness (generalized): Secondary | ICD-10-CM

## 2020-06-03 DIAGNOSIS — M25632 Stiffness of left wrist, not elsewhere classified: Secondary | ICD-10-CM | POA: Diagnosis not present

## 2020-06-03 DIAGNOSIS — M25642 Stiffness of left hand, not elsewhere classified: Secondary | ICD-10-CM

## 2020-06-03 DIAGNOSIS — M25532 Pain in left wrist: Secondary | ICD-10-CM

## 2020-06-03 NOTE — Therapy (Signed)
[place] [facility] [address], [place], [address] Phone: [phone]   Fax:  [phone]  Occupational Therapy Treatment  Patient Details  Name: Dylan Pittman MRN: [id number] Date of Birth: 1951/03/14 Referring Provider (OT): Dr Rudene Christians   Encounter Date: 06/03/2020   OT End of Session - 06/03/20 1058    Visit Number 12    Number of Visits 20    Date for OT Re-Evaluation 07/01/20    OT Start Time 1020    OT Stop Time 1058    OT Time Calculation (min) 38 min    Activity Tolerance Patient tolerated treatment well    Behavior During Therapy [facility] for tasks assessed/performed           Past Medical History:  Diagnosis Date  . Chronic airway obstruction ([place])   . DDD (degenerative disc disease), lumbar   . Dupuytren's disease   . Dyslipidemia 04/07/2014  . Esophageal reflux 04/20/2014  . Hypertension 04/20/2014  . Microalbuminuria 04/07/2014  . Microalbuminuria   . Obesity, unspecified 04/07/2014  . Sleep apnea 04/20/2014  . 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 BACK SURGERY  1992, 2005  . CLOSED REDUCTION FINGER WITH PERCUTANEOUS PINNING Left 02/12/2020   Procedure: Closed reduction and pinning of left first metacarpal fracture with possible open reduction;  Surgeon: Hessie Knows, MD;  Location: [facility] ORS;  Service: Orthopedics;  Laterality: Left;  . COLONOSCOPY WITH PROPOFOL N/A 12/28/2017   Procedure: COLONOSCOPY WITH PROPOFOL;  Surgeon: Manya Silvas, MD;  Location: [facility] ENDOSCOPY;  Service: Endoscopy;  Laterality: N/A;  . NASAL SEPTUM SURGERY    . VASECTOMY  2005    There were no vitals filed for this visit.   Subjective Assessment - 06/03/20 1043    Subjective  One more day of steroid - using hand in most everything except something narrow like cutting knife, brushing teeth , squeeze washcloth    Pertinent History  Pt went to ER after sustaining left hand fracture 02/06/2020 patient was fixing a tire up with air, tire exploded and the rim hit his hand. He suffered a fracture along the base of the left first metacarpal at the base of the thumb as well as several lacerations throughout the left hand. Lacerations were repaired on 3rd and 4th dorsal PIP's except for the left palm this was left open to allow to heal. . Patient took antibiotics. Pt had thumb metacarpal pinning done 6/17 by Dr Rudene Christians , pins come out on 03/05/2020  He is L hand dominant. 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    Currently in Pain? Yes    Pain Score 3     Pain Location Hand   around base of thumb this am   Pain Orientation Left    Pain Descriptors / Indicators Aching    Pain Type Chronic pain    Pain Onset More than a month ago    Pain Frequency Intermittent  [facility] OT Assessment - 06/03/20 0001      AROM   Left Wrist Extension 60 Degrees    Left Wrist Flexion 70 Degrees      Left Hand AROM   L Index  MCP 0-90 70 Degrees    L Index PIP 0-100 85 Degrees    L Long  MCP 0-90 70 Degrees    L Long PIP 0-100 85 Degrees    L Ring  MCP 0-90 65 Degrees    L Ring PIP 0-100 90 Degrees    L Little  MCP 0-90 70 Degrees    L Little PIP 0-100 90 Degrees           measurement taken again - pt still small open area over 3rd proximal phalanges - still not doing paraffin    Pt could pick up and carry 10 lbs no issues, push up , turn doorknob  But holding knife to cut - hard time - not enough composite fist  Or hold toothbrush or squeeze washcloth          OT Treatments/Exercises (OP) -  06/03/20 0001      Moist Heat Therapy   Number Minutes Moist Heat 8 Minutes    Moist Heat Location Hand   in fist prior to ROM and soft tissue     LUE Paraffin   Number Minutes Paraffin --    LUE Paraffin Location --    Comments --         done this date MC spreads and CT spread  Graston tool nr 2 for sweeping on volar wrist and forearm  Ad joint mobs to [facility] and PIP traction with joint mobs prior to ROM     Knuckle bender splint on for 3 min with 2 rubber bands on either side Slight pull  Pt to do at home  AROM in knuckle bender - flexion able to touch bar with all except 3rd -needs to be pain free - had this date no pain at volar wrist or  palm- extend PIP's  Removed and Intrinsic fist AAROM by OT - interlocking Composite fisting to 2 cm foam block and then finger out of palm  Pain free reinforce  To not force flexion to palm if pain     AAROM review with pt for wrist flexion , extention - 15 reps        OT Education - 06/03/20 1058    Education Details progress and HEP    Person(s) Educated Patient    Methods Explanation;Demonstration;Tactile cues;Verbal cues;Handout    Comprehension Verbal cues required;Returned demonstration;Verbalized understanding            OT Short Term Goals - 05/27/20 1004      OT SHORT TERM GOAL #1   Title Pt to be independent in HEP to decrease pain to less than 3/10 at the worse and AROM in digits and wrist to squeeze toothpaste, turning doorknob    Baseline decrease digits in flexion , extention see flowsheet- and wrist decrease -pain cont to limit pt - can increase still to 6/10    Time 3    Period Weeks    Status On-going    Target Date 06/08/20      OT SHORT TERM GOAL #2   Title L hand digits extention inrease to WNL to get hand in pocket and gloves    Baseline extention of PIP's -28 to --35 degrees - progress some - but not getting worse the last  3-4 wks    Time 3    Period Weeks    Status On-going    Target Date  06/08/20             OT Long Term Goals - 05/27/20 1006      OT LONG TERM GOAL #1   Title L hand digits flexion increase for pt to touch palm to hold utencils , steering wheel    Baseline cannot maintain progress    Time 2    Period Weeks    Status On-going    Target Date 06/08/20      OT LONG TERM GOAL #2   Title L wrist AROM and strength increase to same as R to push up from chair , turn doorknob,  carry 10 lbs without increase symptoms    Baseline Pt can push up , and turndoorknob - but not gripping or carry anything with weight - show decrease wrist flexion -cont pain at wrist because of stiffness in digits    Time 2    Period Weeks    Status On-going    Target Date 06/08/20      OT LONG TERM GOAL #3   Title L grip strength and prehension increase to more than 50% compare to R hand and no increase symptoms    Baseline pain 6/10 -with gripping , making fist - cannot make fist - ext and flexion decrease in digits - see flowsheet- grip increased - but decrease again the last week    Time 2    Period Weeks    Status On-going    Target Date 06/08/20      OT LONG TERM GOAL #4   Title PRWHE function and  pain score improve with more than 15 points    Baseline PRWHE for pain 25/5o and function 23/50 at eval - assess next time    Time 2    Period Weeks    Status On-going    Target Date 06/08/20                 Plan - 06/03/20 1059    Clinical Impression Statement Pt is 17 1/2 wks s/o L thumb MC fx with pinning and lacerations dorsal PIP's of 3rd and 4th - pt has one day left on steriods - this is 2nd round - did get paraffin bath yesterday to use - pt did show increase ROM in wrist flexion , and MC flexion at 3rd thru 5th - his doing well with grippping , picking up larger objects but narrow cylinder ones like holdling knife to cut - he has hard time with  -pt to cont with HEP    OT Occupational Profile and History Problem Focused Assessment - Including review of  records relating to presenting problem    Occupational performance deficits (Please refer to evaluation for details): ADL's;IADL's;Work;Play;Leisure;Social Participation    Body Structure / Function / Physical Skills ADL;Decreased knowledge of precautions;Flexibility;ROM;UE functional use;Scar mobility;FMC;Dexterity;Edema;Pain;Strength;IADL    Rehab Potential Good    Clinical Decision Making Limited treatment options, no task modification necessary    Comorbidities Affecting Occupational Performance: None    Modification or Assistance to Complete Evaluation  No modification of tasks or assist necessary to complete eval    OT Frequency 2x / week    OT Duration 4 weeks    OT Treatment/Interventions Self-care/ADL training;Therapeutic exercise;Patient/family education;Splinting;Paraffin;Fluidtherapy;Contrast Bath;Passive range of motion;Manual Therapy;Scar mobilization    Plan assess progress with HEP    OT Home  Exercise Plan see pt instruction    Consulted and Agree with Plan of Care Patient           Patient will benefit from skilled therapeutic intervention in order to improve the following deficits and impairments:   Body Structure / Function / Physical Skills: ADL, Decreased knowledge of precautions, Flexibility, ROM, UE functional use, Scar mobility, FMC, Dexterity, Edema, Pain, Strength, IADL       Visit Diagnosis: Stiffness of left wrist, not elsewhere classified  Stiffness of left hand, not elsewhere classified  Scar tissue  Pain in left hand  Pain in left wrist  Muscle weakness (generalized)    Problem List Patient Active Problem List   Diagnosis Date Noted  . Benign prostatic hyperplasia with lower urinary tract symptoms 07/31/2019  . Elevated PSA 06/18/2018  . Vitamin D deficiency 01/18/2018  . Esophageal reflux 04/20/2014  . Hypertension 04/20/2014  . Sleep apnea 04/20/2014  . Dyslipidemia 04/07/2014  . Microalbuminuria 04/07/2014  . Obesity, unspecified  04/07/2014  . Hyperlipidemia due to type 2 diabetes mellitus ([place]) 04/07/2014  . 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 Type 2 diabetes mellitus with diabetic polyneuropathy, with long-term current use of insulin ([place]) 01/08/2014    Bascom Biel OTR/L,CLT 06/03/2020, 12:09 PM  [place] [facility] [address] [place], [address] Phone: [phone]   Fax:  [phone]  Name: Dylan Pittman MRN: [id number] Date of Birth: [birth year]

## 2020-06-08 ENCOUNTER — Ambulatory Visit: Payer: BC Managed Care – PPO | Admitting: Occupational Therapy

## 2020-06-08 ENCOUNTER — Other Ambulatory Visit: Payer: Self-pay

## 2020-06-08 DIAGNOSIS — M25532 Pain in left wrist: Secondary | ICD-10-CM

## 2020-06-08 DIAGNOSIS — M79642 Pain in left hand: Secondary | ICD-10-CM

## 2020-06-08 DIAGNOSIS — M25632 Stiffness of left wrist, not elsewhere classified: Secondary | ICD-10-CM

## 2020-06-08 DIAGNOSIS — M25642 Stiffness of left hand, not elsewhere classified: Secondary | ICD-10-CM

## 2020-06-08 DIAGNOSIS — L905 Scar conditions and fibrosis of skin: Secondary | ICD-10-CM

## 2020-06-08 DIAGNOSIS — M6281 Muscle weakness (generalized): Secondary | ICD-10-CM

## 2020-06-08 NOTE — Therapy (Signed)
[facility] [address] [place], [address] Phone: [phone]   Fax:  [phone]  Occupational Therapy Treatment  Patient Details  Name: Dylan Pittman MRN: [id number] Date of Birth: 1950/09/12 Referring Provider (OT): Dr Rudene Christians   Encounter Date: 06/08/2020   OT End of Session - 06/08/20 1046    Visit Number 13    Number of Visits 20    Date for OT Re-Evaluation 07/01/20    OT Start Time 1025    OT Stop Time 1045    OT Time Calculation (min) 20 min    Activity Tolerance Patient tolerated treatment well    Behavior During Therapy [facility] for tasks assessed/performed           Past Medical History:  Diagnosis Date  . Chronic airway obstruction ([place])   . DDD (degenerative disc disease), lumbar   . Dupuytren's disease   . Dyslipidemia 04/07/2014  . Esophageal reflux 04/20/2014  . Hypertension 04/20/2014  . Microalbuminuria 04/07/2014  . Microalbuminuria   . Obesity, unspecified 04/07/2014  . Sleep apnea 04/20/2014  . 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 BACK SURGERY  1992, 2005  . CLOSED REDUCTION FINGER WITH PERCUTANEOUS PINNING Left 02/12/2020   Procedure: Closed reduction and pinning of left first metacarpal fracture with possible open reduction;  Surgeon: Hessie Knows, MD;  Location: [facility] ORS;  Service: Orthopedics;  Laterality: Left;  . COLONOSCOPY WITH PROPOFOL N/A 12/28/2017   Procedure: COLONOSCOPY WITH PROPOFOL;  Surgeon: Manya Silvas, MD;  Location: [facility] ENDOSCOPY;  Service: Endoscopy;  Laterality: N/A;  . NASAL SEPTUM SURGERY    . VASECTOMY  2005    There were no vitals filed for this visit.   Subjective Assessment - 06/08/20 1045    Subjective  I got my paraffin bath and using it 3 x day -and then finshed my steroids yesterday - I fell behind - could not take 5 pills at time - pain better - just  stiffness and tightness but can make better fist    Pertinent History Pt went to ER after sustaining left hand fracture 02/06/2020 patient was fixing a tire up with air, tire exploded and the rim hit his hand. He suffered a fracture along the base of the left first metacarpal at the base of the thumb as well as several lacerations throughout the left hand. Lacerations were repaired on 3rd and 4th dorsal PIP's except for the left palm this was left open to allow to heal. . Patient took antibiotics. Pt had thumb metacarpal pinning done 6/17 by Dr Rudene Christians , pins come out on 03/05/2020  He is L hand dominant. 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    Currently in Pain? No/denies              [facility] OT Assessment - 06/08/20 0001      AROM   Right Wrist Extension 60 Degrees    Right Wrist Flexion 75 Degrees  Left Wrist Extension 60 Degrees    Left Wrist Flexion 70 Degrees      Strength   Right Hand Grip (lbs) 74    Right Hand Lateral Pinch 24 lbs    Right Hand 3 Point Pinch 26 lbs    Left Hand Grip (lbs) 52    Left Hand Lateral Pinch 19 lbs    Left Hand 3 Point Pinch 18 lbs      Left Hand AROM   L Index  MCP 0-90 70 Degrees    L Index PIP 0-100 85 Degrees    L Long  MCP 0-90 70 Degrees    L Long PIP 0-100 85 Degrees   -25   L Ring  MCP 0-90 75 Degrees    L Ring PIP 0-100 90 Degrees   -35   L Little  MCP 0-90 82 Degrees    L Little PIP 0-100 90 Degrees   -25            Pt late for his appt - report 2nd round of steroid he finished yesterday. Has paraffin bath now - using it 3 x day and knucklebender splint for MC flexion  Pt arrive with less pain , able to  make better fist - cont stiffness and tightness over PIP"s and MC - dorsally  Decrease pain at wrist  Great progress in grip and prehension  Pt to cont with HEP - using paraffin - and focus on MC flexion -and increase MC flexion during composite  Keeping pain under 2/10                 OT Education - 06/08/20 1046    Education Details progress and HEP    Person(s) Educated Patient    Methods Explanation;Demonstration;Tactile cues;Verbal cues;Handout    Comprehension Verbal cues required;Returned demonstration;Verbalized understanding            OT Short Term Goals - 05/27/20 1004      OT SHORT TERM GOAL #1   Title Pt to be independent in HEP to decrease pain to less than 3/10 at the worse and AROM in digits and wrist to squeeze toothpaste, turning doorknob    Baseline decrease digits in flexion , extention see flowsheet- and wrist decrease -pain cont to limit pt - can increase still to 6/10    Time 3    Period Weeks    Status On-going    Target Date 06/08/20      OT SHORT TERM GOAL #2   Title L hand digits extention inrease to WNL to get hand in pocket and gloves    Baseline extention of PIP's -28 to --35 degrees - progress some - but not getting worse the last 3-4 wks    Time 3    Period Weeks    Status On-going    Target Date 06/08/20             OT Long Term Goals - 05/27/20 1006      OT LONG TERM GOAL #1   Title L hand digits flexion increase for pt to touch palm to hold utencils , steering wheel    Baseline cannot maintain progress    Time 2    Period Weeks    Status On-going    Target Date 06/08/20      OT LONG TERM GOAL #2   Title L wrist AROM and strength increase to same as R to push up from chair , turn doorknob,  carry 10 lbs without increase symptoms    Baseline Pt can push up , and turndoorknob - but not gripping or carry anything with weight - show decrease wrist flexion -cont pain at wrist because of stiffness in digits    Time 2     Period Weeks    Status On-going    Target Date 06/08/20      OT LONG TERM GOAL #3   Title L grip strength and prehension increase to more than 50% compare to R hand and no increase symptoms    Baseline pain 6/10 -with gripping , making fist - cannot make fist - ext and flexion decrease in digits - see flowsheet- grip increased - but decrease again the last week    Time 2    Period Weeks    Status On-going    Target Date 06/08/20      OT LONG TERM GOAL #4   Title PRWHE function and  pain score improve with more than 15 points    Baseline PRWHE for pain 25/5o and function 23/50 at eval - assess next time    Time 2    Period Weeks    Status On-going    Target Date 06/08/20                 Plan - 06/08/20 1227    Clinical Impression Statement Pt show arrive this date with increase motion in hand , less pain  but still stiffness/ttightness over dorsal digits - great progress in grip and prehension strength- pt fiinished his steroid yesterday - pt has parafin unit he use at home this past week , and knuckle bender splint for MCflexion -and AROM to palm - to focus on AAROM MC flexion and composite flexion - but keep pain under 2/10    OT Occupational Profile and History Problem Focused Assessment - Including review of records relating to presenting problem    Occupational performance deficits (Please refer to evaluation for details): ADL's;IADL's;Work;Play;Leisure;Social Participation    Body Structure / Function / Physical Skills ADL;Decreased knowledge of precautions;Flexibility;ROM;UE functional use;Scar mobility;FMC;Dexterity;Edema;Pain;Strength;IADL    Rehab Potential Good    Clinical Decision Making Limited treatment options, no task modification necessary    Comorbidities Affecting Occupational Performance: None    Modification or Assistance to Complete Evaluation  No modification of tasks or assist necessary to complete eval    OT Frequency 2x / week    OT Duration 4 weeks     OT Treatment/Interventions Self-care/ADL training;Therapeutic exercise;Patient/family education;Splinting;Paraffin;Fluidtherapy;Contrast Bath;Passive range of motion;Manual Therapy;Scar mobilization    Plan assess progress with HEP    OT Home Exercise Plan see pt instruction    Consulted and Agree with Plan of Care Patient           Patient will benefit from skilled therapeutic intervention in order to improve the following deficits and impairments:   Body Structure / Function / Physical Skills: ADL, Decreased knowledge of precautions, Flexibility, ROM, UE functional use, Scar mobility, FMC, Dexterity, Edema, Pain, Strength, IADL       Visit Diagnosis: Stiffness of left wrist, not elsewhere classified  Stiffness of left hand, not elsewhere classified  Scar tissue  Pain in left hand  Pain in left wrist  Muscle weakness (generalized)    Problem List Patient Active Problem List   Diagnosis Date Noted  . Benign prostatic hyperplasia with lower urinary tract symptoms 07/31/2019  . Elevated PSA 06/18/2018  . Vitamin D deficiency 01/18/2018  . Esophageal reflux  04/20/2014  . Hypertension 04/20/2014  . Sleep apnea 04/20/2014  . Dyslipidemia 04/07/2014  . Microalbuminuria 04/07/2014  . Obesity, unspecified 04/07/2014  . Hyperlipidemia due to type 2 diabetes mellitus ([place]) 04/07/2014  . 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 Type 2 diabetes mellitus with diabetic polyneuropathy, with long-term current use of insulin ([place]) 01/08/2014    Rosalyn Gess OTR/L,CLT 06/08/2020, 12:31 PM  [facility] [address], [place], [address] Phone: [phone]   Fax:  [phone]  Name: MAEJOR ERVEN MRN: [id number] Date of Birth: [birth year]

## 2020-06-11 ENCOUNTER — Other Ambulatory Visit: Payer: Self-pay

## 2020-06-11 ENCOUNTER — Ambulatory Visit: Payer: BC Managed Care – PPO | Admitting: Occupational Therapy

## 2020-06-11 DIAGNOSIS — M6281 Muscle weakness (generalized): Secondary | ICD-10-CM

## 2020-06-11 DIAGNOSIS — L905 Scar conditions and fibrosis of skin: Secondary | ICD-10-CM

## 2020-06-11 DIAGNOSIS — M25642 Stiffness of left hand, not elsewhere classified: Secondary | ICD-10-CM

## 2020-06-11 DIAGNOSIS — M25532 Pain in left wrist: Secondary | ICD-10-CM

## 2020-06-11 DIAGNOSIS — M79642 Pain in left hand: Secondary | ICD-10-CM

## 2020-06-11 DIAGNOSIS — M25632 Stiffness of left wrist, not elsewhere classified: Secondary | ICD-10-CM

## 2020-06-11 NOTE — Therapy (Signed)
[facility] [address] [place], [address] Phone: [phone]   Fax:  [phone]  Occupational Therapy Treatment  Patient Details  Name: Dylan Pittman MRN: [id number] Date of Birth: July 29, 1951 Referring Provider (OT): Dr Rudene Christians   Encounter Date: 06/11/2020   OT End of Session - 06/11/20 1025    Visit Number 14    Number of Visits 20    Date for OT Re-Evaluation 07/01/20    OT Start Time 1000    OT Stop Time 1042    OT Time Calculation (min) 42 min    Activity Tolerance Patient tolerated treatment well    Behavior During Therapy [facility] for tasks assessed/performed           Past Medical History:  Diagnosis Date  . Chronic airway obstruction ([place])   . DDD (degenerative disc disease), lumbar   . Dupuytren's disease   . Dyslipidemia 04/07/2014  . Esophageal reflux 04/20/2014  . Hypertension 04/20/2014  . Microalbuminuria 04/07/2014  . Microalbuminuria   . Obesity, unspecified 04/07/2014  . Sleep apnea 04/20/2014  . 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 BACK SURGERY  1992, 2005  . CLOSED REDUCTION FINGER WITH PERCUTANEOUS PINNING Left 02/12/2020   Procedure: Closed reduction and pinning of left first metacarpal fracture with possible open reduction;  Surgeon: Hessie Knows, MD;  Location: [facility] ORS;  Service: Orthopedics;  Laterality: Left;  . COLONOSCOPY WITH PROPOFOL N/A 12/28/2017   Procedure: COLONOSCOPY WITH PROPOFOL;  Surgeon: Manya Silvas, MD;  Location[facility] ENDOSCOPY;  Service: Endoscopy;  Laterality: N/A;  . NASAL SEPTUM SURGERY    . VASECTOMY  2005    There were no vitals filed for this visit.   Subjective Assessment - 06/11/20 1022    Subjective  Still doing okay on pain - but thumb bothering me more - still some stiffness in fingers -fist still better- medication was done MOnday - Paraffin helps  warm it up    Pertinent History Pt went to ER after sustaining left hand fracture 02/06/2020 patient was fixing a tire up with air, tire exploded and the rim hit his hand. He suffered a fracture along the base of the left first metacarpal at the base of the thumb as well as several lacerations throughout the left hand. Lacerations were repaired on 3rd and 4th dorsal PIP's except for the left palm this was left open to allow to heal. . Patient took antibiotics. Pt had thumb metacarpal pinning done 6/17 by Dr Rudene Christians , pins come out on 03/05/2020  He is L hand dominant. 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    Currently in Pain? No/denies              [facility] OT Assessment - 06/11/20 0001      AROM   Right Wrist Extension 60 Degrees    Right Wrist Flexion 75 Degrees    Left Wrist Extension 55 Degrees    Left  Wrist Flexion 70 Degrees      Strength   Right Hand Grip (lbs) 74    Right Hand Lateral Pinch 24 lbs    Right Hand 3 Point Pinch 26 lbs    Left Hand Grip (lbs) 54    Left Hand Lateral Pinch 19 lbs    Left Hand 3 Point Pinch 15 lbs      Left Hand AROM   L Index  MCP 0-90 65 Degrees    L Index PIP 0-100 85 Degrees    L Long  MCP 0-90 70 Degrees    L Long PIP 0-100 90 Degrees    L Ring  MCP 0-90 75 Degrees    L Ring PIP 0-100 92 Degrees    L Little  MCP 0-90 80 Degrees    L Little PIP 0-100 95 Degrees            report 2nd round of steroid he finished 5 days ago. Has paraffin bath now - using it 3 x day and knucklebender splint for MC flexion  Pt arrive with less pain in fingers - but increase thumb pain , able to make better fist - cont stiffness and  tightness over PIP"s and MC dorsally- but paraffin decrease that for him to do his exercises  Decrease pain at wrist  Great progress in grip again -and fitted with CMC neoprene splint to decrease pain with use - grip increase 10 lbs with CMC neoprene         OT Treatments/Exercises (OP) - 06/11/20 0001      LUE Paraffin   Number Minutes Paraffin 8 Minutes    LUE Paraffin Location Hand;Wrist    Comments prior to soft tissue and ROM           Graston tool nr 2 sweeping and brushing on volar and dorsal forearm , wrist  And fingers with extention of digits CT spreads and soft tissue mobs to webspace  Gentle traction and joint mobs to PIP's of all digits ,and thumb  Cont with knuckle bender and HEP for flexion to bar in palm and then AROM tendon glides  Done fisting afterwards  Encourage pt to use hand more at home and if needed use CMC neoprene splint to decrease thumb pain during use  will monitor if pt can keep progressing with not being on steroid anymore Pt to cont with HEP - using paraffin - and focus on MC flexion -and increase MC flexion during composite  Keeping pain under 2/10          OT Education - 06/11/20 1025    Education Details progress and HEP    Person(s) Educated Patient    Methods Explanation;Demonstration;Tactile cues;Verbal cues;Handout    Comprehension Verbal cues required;Returned demonstration;Verbalized understanding            OT Short Term Goals - 05/27/20 1004      OT SHORT TERM GOAL #1   Title Pt to be independent in HEP to decrease pain to less than 3/10 at the worse and AROM in digits and wrist to squeeze toothpaste, turning doorknob    Baseline decrease digits in flexion , extention see flowsheet- and wrist decrease -pain cont to limit pt - can increase still to 6/10    Time 3    Period Weeks    Status On-going    Target Date 06/08/20      OT SHORT TERM GOAL #2   Title L hand digits extention inrease  to WNL to get hand in pocket  and gloves    Baseline extention of PIP's -28 to --35 degrees - progress some - but not getting worse the last 3-4 wks    Time 3    Period Weeks    Status On-going    Target Date 06/08/20             OT Long Term Goals - 05/27/20 1006      OT LONG TERM GOAL #1   Title L hand digits flexion increase for pt to touch palm to hold utencils , steering wheel    Baseline cannot maintain progress    Time 2    Period Weeks    Status On-going    Target Date 06/08/20      OT LONG TERM GOAL #2   Title L wrist AROM and strength increase to same as R to push up from chair , turn doorknob,  carry 10 lbs without increase symptoms    Baseline Pt can push up , and turndoorknob - but not gripping or carry anything with weight - show decrease wrist flexion -cont pain at wrist because of stiffness in digits    Time 2    Period Weeks    Status On-going    Target Date 06/08/20      OT LONG TERM GOAL #3   Title L grip strength and prehension increase to more than 50% compare to R hand and no increase symptoms    Baseline pain 6/10 -with gripping , making fist - cannot make fist - ext and flexion decrease in digits - see flowsheet- grip increased - but decrease again the last week    Time 2    Period Weeks    Status On-going    Target Date 06/08/20      OT LONG TERM GOAL #4   Title PRWHE function and  pain score improve with more than 15 points    Baseline PRWHE for pain 25/5o and function 23/50 at eval - assess next time    Time 2    Period Weeks    Status On-going    Target Date 06/08/20                 Plan - 06/11/20 1025    Clinical Impression Statement Pt cont to show progress in AROM in L hand digits and increase grip strength - report some increase pain in thumb thenar - but because of increase work on gripping - fitted iwth CMC neoprene splint-grip increase 8 lbs and no splint - pt to use hand over weekend more and can use neoprene as needed and paraffin to cont to work on  compostie flexion- steroied use done since earlier this week    OT Occupational Profile and History Problem Focused Assessment - Including review of records relating to presenting problem    Occupational performance deficits (Please refer to evaluation for details): ADL's;IADL's;Work;Play;Leisure;Social Participation    Body Structure / Function / Physical Skills ADL;Decreased knowledge of precautions;Flexibility;ROM;UE functional use;Scar mobility;FMC;Dexterity;Edema;Pain;Strength;IADL    Rehab Potential Good    Clinical Decision Making Limited treatment options, no task modification necessary    Comorbidities Affecting Occupational Performance: None    Modification or Assistance to Complete Evaluation  No modification of tasks or assist necessary to complete eval    OT Frequency 2x / week    OT Duration 4 weeks    OT Treatment/Interventions Self-care/ADL training;Therapeutic exercise;Patient/family education;Splinting;Paraffin;Fluidtherapy;Contrast Bath;Passive range of motion;Manual Therapy;Scar mobilization  Plan assess progress with HEP    OT Home Exercise Plan see pt instruction    Consulted and Agree with Plan of Care Patient           Patient will benefit from skilled therapeutic intervention in order to improve the following deficits and impairments:   Body Structure / Function / Physical Skills: ADL, Decreased knowledge of precautions, Flexibility, ROM, UE functional use, Scar mobility, FMC, Dexterity, Edema, Pain, Strength, IADL       Visit Diagnosis: Stiffness of left hand, not elsewhere classified  Scar tissue  Pain in left hand  Pain in left wrist  Muscle weakness (generalized)  Stiffness of left wrist, not elsewhere classified    Problem List Patient Active Problem List   Diagnosis Date Noted  . Benign prostatic hyperplasia with lower urinary tract symptoms 07/31/2019  . Elevated PSA 06/18/2018  . Vitamin D deficiency 01/18/2018  . Esophageal reflux  04/20/2014  . Hypertension 04/20/2014  . Sleep apnea 04/20/2014  . Dyslipidemia 04/07/2014  . Microalbuminuria 04/07/2014  . Obesity, unspecified 04/07/2014  . Hyperlipidemia due to type 2 diabetes mellitus ([place]) 04/07/2014  . 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 Type 2 diabetes mellitus with diabetic polyneuropathy, with long-term current use of insulin ([place]) 01/08/2014    Rosalyn Gess OTR/L,CLT 06/11/2020, 12:29 PM  [facility] [address], [place], [address] Phone: [phone]   Fax:  [phone]  Name: Dylan Pittman MRN: [id number] Date of Birth: [birth year]

## 2020-06-17 ENCOUNTER — Ambulatory Visit: Payer: BC Managed Care – PPO | Admitting: Occupational Therapy

## 2020-06-17 ENCOUNTER — Other Ambulatory Visit: Payer: Self-pay

## 2020-06-17 DIAGNOSIS — L905 Scar conditions and fibrosis of skin: Secondary | ICD-10-CM

## 2020-06-17 DIAGNOSIS — M25642 Stiffness of left hand, not elsewhere classified: Secondary | ICD-10-CM

## 2020-06-17 DIAGNOSIS — M25632 Stiffness of left wrist, not elsewhere classified: Secondary | ICD-10-CM | POA: Diagnosis not present

## 2020-06-17 DIAGNOSIS — M79642 Pain in left hand: Secondary | ICD-10-CM

## 2020-06-17 DIAGNOSIS — M25532 Pain in left wrist: Secondary | ICD-10-CM

## 2020-06-17 DIAGNOSIS — M6281 Muscle weakness (generalized): Secondary | ICD-10-CM

## 2020-06-17 NOTE — Therapy (Signed)
[place] [facility] [address], [place], [address] Phone: [phone]   Fax:  [phone]  Occupational Therapy Treatment  Patient Details  Name: Dylan Pittman MRN: [id number] Date of Birth: 04/19/1951 Referring Provider (OT): Dr Rudene Christians   Encounter Date: 06/17/2020   OT End of Session - 06/17/20 0834    Visit Number 15    Number of Visits 20    Date for OT Re-Evaluation 07/01/20    OT Start Time 0820    OT Stop Time 0856    OT Time Calculation (min) 36 min    Activity Tolerance Patient tolerated treatment well    Behavior During Therapy [facility] for tasks assessed/performed           Past Medical History:  Diagnosis Date  . Chronic airway obstruction ([place])   . DDD (degenerative disc disease), lumbar   . Dupuytren's disease   . Dyslipidemia 04/07/2014  . Esophageal reflux 04/20/2014  . Hypertension 04/20/2014  . Microalbuminuria 04/07/2014  . Microalbuminuria   . Obesity, unspecified 04/07/2014  . Sleep apnea 04/20/2014  . 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 BACK SURGERY  1992, 2005  . CLOSED REDUCTION FINGER WITH PERCUTANEOUS PINNING Left 02/12/2020   Procedure: Closed reduction and pinning of left first metacarpal fracture with possible open reduction;  Surgeon: Hessie Knows, MD;  Location: [facility] ORS;  Service: Orthopedics;  Laterality: Left;  . COLONOSCOPY WITH PROPOFOL N/A 12/28/2017   Procedure: COLONOSCOPY WITH PROPOFOL;  Surgeon: Manya Silvas, MD;  Location: [facility] ENDOSCOPY;  Service: Endoscopy;  Laterality: N/A;  . NASAL SEPTUM SURGERY    . VASECTOMY  2005    There were no vitals filed for this visit.   Subjective Assessment - 06/17/20 0830    Subjective  I think the medication is wearing down again - did use it more this week to see what my hand does - stiff in the morming- and pain at base of thumb and  wrist with gripping - hand is just clumsy  - tight over the fingers - swelling is still down    Pertinent History Pt went to ER after sustaining left hand fracture 02/06/2020 patient was fixing a tire up with air, tire exploded and the rim hit his hand. He suffered a fracture along the base of the left first metacarpal at the base of the thumb as well as several lacerations throughout the left hand. Lacerations were repaired on 3rd and 4th dorsal PIP's except for the left palm this was left open to allow to heal. . Patient took antibiotics. Pt had thumb metacarpal pinning done 6/17 by Dr Rudene Christians , pins come out on 03/05/2020  He is L hand dominant. 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    Currently in Pain? Yes    Pain Score 5     Pain Location Wrist    Pain Orientation Left    Pain Descriptors / Indicators Aching  Pain Type Chronic pain    Pain Frequency Intermittent    Aggravating Factors  griping - at base of thumb at wrist p              [facility] OT Assessment - 06/17/20 0001      AROM   Right Wrist Extension 60 Degrees    Right Wrist Flexion 75 Degrees    Left Wrist Extension 55 Degrees    Left Wrist Flexion 70 Degrees      Strength   Right Hand Grip (lbs) 74    Right Hand Lateral Pinch 24 lbs    Right Hand 3 Point Pinch 26 lbs    Left Hand Grip (lbs) 48    Left Hand Lateral Pinch 19 lbs    Left Hand 3 Point Pinch 17 lbs      Left Hand AROM   L Index  MCP 0-90 65 Degrees    L Index PIP 0-100 85 Degrees    L Long  MCP 0-90 70 Degrees    L Long PIP 0-100 80 Degrees   -25   L Ring  MCP 0-90 70 Degrees    L Ring PIP 0-100 90 Degrees   -30   L Little  MCP  0-90 70 Degrees    L Little PIP 0-100 90 Degrees   -20           report 2nd round of steroid his thinking is working out of his system. Has paraffin bath now - using it 3 x day and knucklebender splint for MC flexion  Pt arrive with less pain in fingers -more stiffness  - but increase thumb and wrist pain , able to make better fist still than prior to steroids  cont stiffness and tightness over PIP"s and MC dorsally-  paraffin helps in am for stiffness  Did had pt use his hand more this past week - thumb and wrist pain probable because of stiffness  In digits and compensate with thumb ADD and wrist flexion -to grip objects  Did had great progress in grip with last round of steriods - but did decrease some this past week - last time  fitted pt with CMC neoprene splint to decrease pain at thumb with use - grip increase 10 lbs with CMC neoprene             OT Treatments/Exercises (OP) - 06/17/20 0001      LUE Paraffin   Number Minutes Paraffin 8 Minutes    LUE Paraffin Location Hand;Wrist    Comments decrease pain and prior to ROM/soft tiissue            Graston tool nr 2 sweeping and brushing on volar and dorsal forearm , wrist  And fingers with extention of digits CT spreads and soft tissue mobs to webspace  Gentle traction and joint mobs to PIP's of all digits ,and thumb  Cont with knuckle bender and HEP for flexion to bar in palm and then AROM tendon glides  Done fisting afterwards  Encourage pt to use hand more at home and if needed use CMC neoprene splint to decrease thumb pain during use   Pt to cont with HEP - using paraffin - and focus on MC flexion -and increase MC flexion during composite  Keeping pain under 2/10 See Dr Rudene Christians next week          OT Education - 06/17/20 0832    Education Details progress and HEP    Person(s)  Educated Patient    Methods Explanation;Demonstration;Tactile cues;Verbal cues;Handout    Comprehension Verbal cues  required;Returned demonstration;Verbalized understanding            OT Short Term Goals - 05/27/20 1004      OT SHORT TERM GOAL #1   Title Pt to be independent in HEP to decrease pain to less than 3/10 at the worse and AROM in digits and wrist to squeeze toothpaste, turning doorknob    Baseline decrease digits in flexion , extention see flowsheet- and wrist decrease -pain cont to limit pt - can increase still to 6/10    Time 3    Period Weeks    Status On-going    Target Date 06/08/20      OT SHORT TERM GOAL #2   Title L hand digits extention inrease to WNL to get hand in pocket and gloves    Baseline extention of PIP's -28 to --35 degrees - progress some - but not getting worse the last 3-4 wks    Time 3    Period Weeks    Status On-going    Target Date 06/08/20             OT Long Term Goals - 05/27/20 1006      OT LONG TERM GOAL #1   Title L hand digits flexion increase for pt to touch palm to hold utencils , steering wheel    Baseline cannot maintain progress    Time 2    Period Weeks    Status On-going    Target Date 06/08/20      OT LONG TERM GOAL #2   Title L wrist AROM and strength increase to same as R to push up from chair , turn doorknob,  carry 10 lbs without increase symptoms    Baseline Pt can push up , and turndoorknob - but not gripping or carry anything with weight - show decrease wrist flexion -cont pain at wrist because of stiffness in digits    Time 2    Period Weeks    Status On-going    Target Date 06/08/20      OT LONG TERM GOAL #3   Title L grip strength and prehension increase to more than 50% compare to R hand and no increase symptoms    Baseline pain 6/10 -with gripping , making fist - cannot make fist - ext and flexion decrease in digits - see flowsheet- grip increased - but decrease again the last week    Time 2    Period Weeks    Status On-going    Target Date 06/08/20      OT LONG TERM GOAL #4   Title PRWHE function and  pain  score improve with more than 15 points    Baseline PRWHE for pain 25/5o and function 23/50 at eval - assess next time    Time 2    Period Weeks    Status On-going    Target Date 06/08/20                 Plan - 06/17/20 0834    Clinical Impression Statement Pt is close to 5 months out from injury - pt cont to have stiffness and tightness over MC's more than PIP's -MC's stays about 60-70's ,and PIP's around 90's - edema did improve with steroids and ROM - but pain now in thumb [facility] because of increase use and stiffness in digits - compensate with thumb ADD and wrist  flexion- pt has paraffin at home , HEP , knuckle bender splint to use for MC flexion strethc - pt to see Dr Rudene Christians    OT Occupational Profile and History Problem Focused Assessment - Including review of records relating to presenting problem    Occupational performance deficits (Please refer to evaluation for details): ADL's;IADL's;Work;Play;Leisure;Social Participation    Body Structure / Function / Physical Skills ADL;Decreased knowledge of precautions;Flexibility;ROM;UE functional use;Scar mobility;FMC;Dexterity;Edema;Pain;Strength;IADL    Rehab Potential Good    Clinical Decision Making Limited treatment options, no task modification necessary    Comorbidities Affecting Occupational Performance: None    Modification or Assistance to Complete Evaluation  No modification of tasks or assist necessary to complete eval    OT Frequency 2x / week    OT Duration 4 weeks    OT Treatment/Interventions Self-care/ADL training;Therapeutic exercise;Patient/family education;Splinting;Paraffin;Fluidtherapy;Contrast Bath;Passive range of motion;Manual Therapy;Scar mobilization    Plan assess progress with HEP    OT Home Exercise Plan see pt instruction    Consulted and Agree with Plan of Care Patient           Patient will benefit from skilled therapeutic intervention in order to improve the following deficits and impairments:   Body  Structure / Function / Physical Skills: ADL, Decreased knowledge of precautions, Flexibility, ROM, UE functional use, Scar mobility, FMC, Dexterity, Edema, Pain, Strength, IADL       Visit Diagnosis: Scar tissue  Stiffness of left hand, not elsewhere classified  Pain in left wrist  Stiffness of left wrist, not elsewhere classified  Pain in left hand  Muscle weakness (generalized)    Problem List Patient Active Problem List   Diagnosis Date Noted  . Benign prostatic hyperplasia with lower urinary tract symptoms 07/31/2019  . Elevated PSA 06/18/2018  . Vitamin D deficiency 01/18/2018  . Esophageal reflux 04/20/2014  . Hypertension 04/20/2014  . Sleep apnea 04/20/2014  . Dyslipidemia 04/07/2014  . Microalbuminuria 04/07/2014  . Obesity, unspecified 04/07/2014  . Hyperlipidemia due to type 2 diabetes mellitus ([place]) 04/07/2014  . 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 Type 2 diabetes mellitus with diabetic polyneuropathy, with long-term current use of insulin ([place]) 01/08/2014    Rosalyn Gess OTR/l,CLT 06/17/2020, 12:31 PM  [place] [facility] [address], [place], [address] Phone: [phone]   Fax:  [phone]  Name: Dylan Pittman MRN: [id number] Date of Birth: 1951-05-10

## 2020-06-28 ENCOUNTER — Ambulatory Visit: Payer: BC Managed Care – PPO | Attending: Orthopedic Surgery | Admitting: Occupational Therapy

## 2020-06-28 ENCOUNTER — Other Ambulatory Visit: Payer: Self-pay

## 2020-06-28 DIAGNOSIS — L905 Scar conditions and fibrosis of skin: Secondary | ICD-10-CM | POA: Insufficient documentation

## 2020-06-28 DIAGNOSIS — M25632 Stiffness of left wrist, not elsewhere classified: Secondary | ICD-10-CM | POA: Diagnosis present

## 2020-06-28 DIAGNOSIS — M6281 Muscle weakness (generalized): Secondary | ICD-10-CM

## 2020-06-28 DIAGNOSIS — M25642 Stiffness of left hand, not elsewhere classified: Secondary | ICD-10-CM | POA: Diagnosis present

## 2020-06-28 DIAGNOSIS — M79642 Pain in left hand: Secondary | ICD-10-CM | POA: Insufficient documentation

## 2020-06-28 DIAGNOSIS — M25532 Pain in left wrist: Secondary | ICD-10-CM | POA: Insufficient documentation

## 2020-06-28 NOTE — Therapy (Signed)
[place] [facility] [address] [place], [address] Phone: [phone]   Fax:  [phone]  Occupational Therapy Treatment  Patient Details  Name: Dylan Pittman MRN: [id number] Date of Birth: [birth year] Referring Provider (OT): Dr Rudene Christians   Encounter Date: 06/28/2020   OT End of Session - 06/28/20 1228    Visit Number 16    Number of Visits 20    Date for OT Re-Evaluation 07/01/20    OT Start Time 1115    OT Stop Time 1203    OT Time Calculation (min) 48 min    Activity Tolerance Patient tolerated treatment well    Behavior During Therapy [facility] for tasks assessed/performed           Past Medical History:  Diagnosis Date  . Chronic airway obstruction ([place])   . DDD (degenerative disc disease), lumbar   . Dupuytren's disease   . Dyslipidemia 04/07/2014  . Esophageal reflux 04/20/2014  . Hypertension 04/20/2014  . Microalbuminuria 04/07/2014  . Microalbuminuria   . Obesity, unspecified 04/07/2014  . Sleep apnea 04/20/2014  . 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 BACK SURGERY  1992, 2005  . CLOSED REDUCTION FINGER WITH PERCUTANEOUS PINNING Left 02/12/2020   Procedure: Closed reduction and pinning of left first metacarpal fracture with possible open reduction;  Surgeon: Hessie Knows, MD;  Location: [facility] ORS;  Service: Orthopedics;  Laterality: Left;  . COLONOSCOPY WITH PROPOFOL N/A 12/28/2017   Procedure: COLONOSCOPY WITH PROPOFOL;  Surgeon: Manya Silvas, MD;  Location: [facility] ENDOSCOPY;  Service: Endoscopy;  Laterality: N/A;  . NASAL SEPTUM SURGERY    . VASECTOMY  2005    There were no vitals filed for this visit.   Subjective Assessment - 06/28/20 1219    Subjective  I seen Dr Rudene Christians - said it will take time -and that I a lot of injury to hand - I am doing my exercises - morning the worse - try to use it a lot- hardest  thing is to grip like knife to cut - hold something small    Pertinent History Pt went to ER after sustaining left hand fracture 02/06/2020 patient was fixing a tire up with air, tire exploded and the rim hit his hand. He suffered a fracture along the base of the left first metacarpal at the base of the thumb as well as several lacerations throughout the left hand. Lacerations were repaired on 3rd and 4th dorsal PIP's except for the left palm this was left open to allow to heal. . Patient took antibiotics. Pt had thumb metacarpal pinning done 6/17 by Dr Rudene Christians , pins come out on 03/05/2020  He is L hand dominant. 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    Currently in Pain? Yes    Pain Score 3     Pain Location Hand    Pain Orientation Left    Pain Descriptors / Indicators Aching;Tightness    Pain Type Chronic  pain    Pain Onset More than a month ago    Pain Frequency Intermittent              [facility] OT Assessment - 06/28/20 0001      AROM   Left Wrist Extension 58 Degrees    Left Wrist Flexion 65 Degrees      Strength   Right Hand Grip (lbs) 74    Right Hand Lateral Pinch 24 lbs    Right Hand 3 Point Pinch 26 lbs    Left Hand Grip (lbs) 45    Left Hand Lateral Pinch 19 lbs    Left Hand 3 Point Pinch 17 lbs      Left Hand AROM   L Index  MCP 0-90 65 Degrees    L Index PIP 0-100 85 Degrees    L Long  MCP 0-90 70 Degrees    L Long PIP 0-100 85 Degrees   -25   L Ring  MCP 0-90 70 Degrees    L Ring PIP 0-100 95 Degrees   -35   L Little  MCP 0-90 70 Degrees    L Little PIP 0-100 95 Degrees   -25           Pt show increase PIP flexion but same MC flexion    But report better composite flexion - able to touch palm sometimes after paraffin use  Mornings is the worse -stiffness and some pain - but then gets better   using hand more - hardest to make tight fist to hold knife  Pt's grip decrease - prehension same compare to before         OT Treatments/Exercises (OP) - 06/28/20 0001      LUE Paraffin   Number Minutes Paraffin 8 Minutes    LUE Paraffin Location Hand;Wrist    Comments decrease stiffness, increase ROM - LMB on 4th and 5th PIP extention              Graston tool nr 2 sweeping and brushing on volar and dorsal forearm , wrist And fingers with extention of digits Able to increase PIP extention by 5-10 degrees  CT spreads and soft tissue mobs to webspace  Gentle traction and joint mobs to PIP's of all digits ,and thumb -10 degrees  Cont with knuckle bender  - done 4 min - and add rubber band to each side  And to cont at home  flexion  Of MC - and then AROM fisting to bar in palm and then AROM tendon glides to palm Done fisting afterwards  Add putty for gripping , lat and 3 point grip- 12 reps  2 x day - rolling putty for digits extention in between- increase 2 set in 3 days if no increase pain and 6 days 3 set  Pain free  Encourage pt to use hand more at home and if needed use CMC neoprene splint to decrease thumb pain during use   Pt to cont with HEP - using paraffin - and focus on MC flexion - and knuckle bender and increase MC flexion during composite  Keeping pain under 2/10 And putty        OT Education - 06/28/20 1225    Education Details progress and HEP    Person(s) Educated Patient    Methods Explanation;Demonstration;Tactile cues;Verbal cues;Handout    Comprehension Verbal cues required;Returned demonstration;Verbalized understanding            OT Short Term Goals -  05/27/20 1004      OT SHORT TERM GOAL #1   Title Pt to be independent in HEP to decrease pain to less than 3/10 at the worse and AROM  in digits and wrist to squeeze toothpaste, turning doorknob    Baseline decrease digits in flexion , extention see flowsheet- and wrist decrease -pain cont to limit pt - can increase still to 6/10    Time 3    Period Weeks    Status On-going    Target Date 06/08/20      OT SHORT TERM GOAL #2   Title L hand digits extention inrease to WNL to get hand in pocket and gloves    Baseline extention of PIP's -28 to --35 degrees - progress some - but not getting worse the last 3-4 wks    Time 3    Period Weeks    Status On-going    Target Date 06/08/20             OT Long Term Goals - 05/27/20 1006      OT LONG TERM GOAL #1   Title L hand digits flexion increase for pt to touch palm to hold utencils , steering wheel    Baseline cannot maintain progress    Time 2    Period Weeks    Status On-going    Target Date 06/08/20      OT LONG TERM GOAL #2   Title L wrist AROM and strength increase to same as R to push up from chair , turn doorknob,  carry 10 lbs without increase symptoms    Baseline Pt can push up , and turndoorknob - but not gripping or carry anything with weight - show decrease wrist flexion -cont pain at wrist because of stiffness in digits    Time 2    Period Weeks    Status On-going    Target Date 06/08/20      OT LONG TERM GOAL #3   Title L grip strength and prehension increase to more than 50% compare to R hand and no increase symptoms    Baseline pain 6/10 -with gripping , making fist - cannot make fist - ext and flexion decrease in digits - see flowsheet- grip increased - but decrease again the last week    Time 2    Period Weeks    Status On-going    Target Date 06/08/20      OT LONG TERM GOAL #4   Title PRWHE function and  pain score improve with more than 15 points    Baseline PRWHE for pain 25/5o and function 23/50 at eval - assess next time    Time 2    Period Weeks    Status On-going    Target Date 06/08/20                 Plan - 06/28/20  1228    Clinical Impression Statement Pt is over 5 months out from injury - pt showed this date increase PIP flexion , report able to make better composite fist after using paraffin - less pain - and using it more - did show cont  decrease strength in grip and prehension - pt did loose 5 degrees in extention at PIP's - add this date putty for grip and prehension - but to do rolling inbetween -and focus on MC flexion prior to composite fist    OT Occupational Profile and History Problem Focused Assessment - Including review  of records relating to presenting problem    Occupational performance deficits (Please refer to evaluation for details): ADL's;IADL's;Work;Play;Leisure;Social Participation    Body Structure / Function / Physical Skills ADL;Decreased knowledge of precautions;Flexibility;ROM;UE functional use;Scar mobility;FMC;Dexterity;Edema;Pain;Strength;IADL    Rehab Potential Good    Clinical Decision Making Limited treatment options, no task modification necessary    Comorbidities Affecting Occupational Performance: None    Modification or Assistance to Complete Evaluation  No modification of tasks or assist necessary to complete eval    OT Frequency 1x / week    OT Duration 4 weeks    OT Treatment/Interventions Self-care/ADL training;Therapeutic exercise;Patient/family education;Splinting;Paraffin;Fluidtherapy;Contrast Bath;Passive range of motion;Manual Therapy;Scar mobilization    Plan assess progress with HEP    OT Home Exercise Plan see pt instruction    Consulted and Agree with Plan of Care Patient           Patient will benefit from skilled therapeutic intervention in order to improve the following deficits and impairments:   Body Structure / Function / Physical Skills: ADL, Decreased knowledge of precautions, Flexibility, ROM, UE functional use, Scar mobility, FMC, Dexterity, Edema, Pain, Strength, IADL       Visit Diagnosis: Scar tissue  Stiffness of left hand, not  elsewhere classified  Pain in left wrist  Stiffness of left wrist, not elsewhere classified  Pain in left hand  Muscle weakness (generalized)    Problem List Patient Active Problem List   Diagnosis Date Noted  . Benign prostatic hyperplasia with lower urinary tract symptoms 07/31/2019  . Elevated PSA 06/18/2018  . Vitamin D deficiency 01/18/2018  . Esophageal reflux 04/20/2014  . Hypertension 04/20/2014  . Sleep apnea 04/20/2014  . Dyslipidemia 04/07/2014  . Microalbuminuria 04/07/2014  . Obesity, unspecified 04/07/2014  . Hyperlipidemia due to type 2 diabetes mellitus ([place]) 04/07/2014  . 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 Type 2 diabetes mellitus with diabetic polyneuropathy, with long-term current use of insulin ([place]) 01/08/2014    Rosalyn Gess OTR/L,CLT 06/28/2020, 12:34 PM  [place] [facility] [address], [place], [address] Phone: [phone]   Fax:  [phone]  Name: TAHJ NJOKU MRN: [id number] Date of Birth: 1951/07/30

## 2020-07-06 ENCOUNTER — Ambulatory Visit: Payer: BC Managed Care – PPO | Admitting: Occupational Therapy

## 2020-07-06 ENCOUNTER — Other Ambulatory Visit: Payer: Self-pay

## 2020-07-06 DIAGNOSIS — M79642 Pain in left hand: Secondary | ICD-10-CM

## 2020-07-06 DIAGNOSIS — M25532 Pain in left wrist: Secondary | ICD-10-CM

## 2020-07-06 DIAGNOSIS — L905 Scar conditions and fibrosis of skin: Secondary | ICD-10-CM

## 2020-07-06 DIAGNOSIS — M25632 Stiffness of left wrist, not elsewhere classified: Secondary | ICD-10-CM

## 2020-07-06 DIAGNOSIS — M25642 Stiffness of left hand, not elsewhere classified: Secondary | ICD-10-CM

## 2020-07-06 DIAGNOSIS — M6281 Muscle weakness (generalized): Secondary | ICD-10-CM

## 2020-07-06 NOTE — Therapy (Signed)
[facility] [address] [place], [address] Phone: [phone]   Fax:  [phone]  Occupational Therapy Treatment  Patient Details  Name: Dylan Pittman MRN: [id number] Date of Birth: [birth year] Referring Provider (OT): Dr Rudene Christians   Encounter Date: 07/06/2020   OT End of Session - 07/06/20 1212    Visit Number 17    Number of Visits 20    Date for OT Re-Evaluation 07/01/20    OT Start Time 1121    OT Stop Time 1200    OT Time Calculation (min) 39 min    Activity Tolerance Patient tolerated treatment well    Behavior During Therapy [facility] for tasks assessed/performed           Past Medical History:  Diagnosis Date  . Chronic airway obstruction ([place])   . DDD (degenerative disc disease), lumbar   . Dupuytren's disease   . Dyslipidemia 04/07/2014  . Esophageal reflux 04/20/2014  . Hypertension 04/20/2014  . Microalbuminuria 04/07/2014  . Microalbuminuria   . Obesity, unspecified 04/07/2014  . Sleep apnea 04/20/2014  . 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 BACK SURGERY  1992, 2005  . CLOSED REDUCTION FINGER WITH PERCUTANEOUS PINNING Left 02/12/2020   Procedure: Closed reduction and pinning of left first metacarpal fracture with possible open reduction;  Surgeon: Hessie Knows, MD;  Location: [facility] ORS;  Service: Orthopedics;  Laterality: Left;  . COLONOSCOPY WITH PROPOFOL N/A 12/28/2017   Procedure: COLONOSCOPY WITH PROPOFOL;  Surgeon: Manya Silvas, MD;  Location: [facility] ENDOSCOPY;  Service: Endoscopy;  Laterality: N/A;  . NASAL SEPTUM SURGERY    . VASECTOMY  2005    There were no vitals filed for this visit.   Subjective Assessment - 07/06/20 1134    Subjective  I am using my hand more -and did the putty - squeezing it and rolling - but my thumb pain is more - in the morning throbbing -    Pertinent History Pt went  to ER after sustaining left hand fracture 02/06/2020 patient was fixing a tire up with air, tire exploded and the rim hit his hand. He suffered a fracture along the base of the left first metacarpal at the base of the thumb as well as several lacerations throughout the left hand. Lacerations were repaired on 3rd and 4th dorsal PIP's except for the left palm this was left open to allow to heal. . Patient took antibiotics. Pt had thumb metacarpal pinning done 6/17 by Dr Rudene Christians , pins come out on 03/05/2020  He is L hand dominant. 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    Currently in Pain? Yes    Pain Score 5     Pain Location Hand    Pain Orientation Left    Pain Descriptors / Indicators Aching;Tightness    Pain Type Chronic pain    Pain Onset More than a month ago    Pain Frequency Intermittent  [facility] OT Assessment - 07/06/20 0001      Strength   Left Hand Grip (lbs) 50    Left Hand Lateral Pinch 19 lbs    Left Hand 3 Point Pinch 18 lbs      Left Hand AROM   L Index  MCP 0-90 65 Degrees    L Index PIP 0-100 85 Degrees    L Long  MCP 0-90 65 Degrees    L Long PIP 0-100 85 Degrees   -25   L Ring  MCP 0-90 75 Degrees    L Ring PIP 0-100 90 Degrees   -40   L Little  MCP 0-90 75 Degrees    L Little PIP 0-100 90 Degrees   -30           Pt show increase 3 point grip and grip strength  Flexion at [facility] and PIP - about same - but decrease exention of PIP's  But coming in with increase pain in thumb thenar eminence and ulnar side of hand - doing putty at home Pt do report increase use - but still having trouble gripping something small and FMC because of  stiffness         OT Treatments/Exercises (OP) - 07/06/20 0001      LUE Paraffin   Number Minutes Paraffin 8 Minutes    LUE Paraffin Location Hand;Wrist    Comments decrease pain - followed by contrast       LUE Contrast Bath   Time 6 minutes    Comments after paraffin to decrease pain and edema in thenar eminence             Graston tool nr 2 sweeping and brushing on volar and dorsal forearm , wrist And fingers with extention of digits Able to increase PIP extention to -20 at 3rd PIP, 4th -35, and 5th -25 CT spreads and soft tissue mobs to webspace  Gentle traction and joint mobs to PIP's of all digits ,and thumb -10 degrees  Cont with knuckle bender  - done 4 min - and add rubber band to each side  Last time And to cont at home  flexion  Of MC - and then AROM fisting to bar in palm and then AROM tendon glides to palm Done fisting afterwards  And do LMB splints sometimes for PIP extention  Alternate during day And HOLD OFF on putty HEP         OT Education - 07/06/20 1212    Education Details progress and HEP    Person(s) Educated Patient    Methods Explanation;Demonstration;Tactile cues;Verbal cues;Handout    Comprehension Verbal cues required;Returned demonstration;Verbalized understanding            OT Short Term Goals - 05/27/20 1004      OT SHORT TERM GOAL #1   Title Pt to be independent in HEP to decrease pain to less than 3/10 at the worse and AROM in digits and wrist to squeeze toothpaste, turning doorknob    Baseline decrease digits in flexion , extention see flowsheet- and wrist decrease -pain cont to limit pt - can increase still to 6/10    Time 3    Period Weeks    Status On-going    Target Date 06/08/20      OT SHORT TERM GOAL #2   Title L hand digits extention inrease to WNL to get hand in pocket and gloves    Baseline extention of PIP's -28 to --35 degrees -  progress some - but not getting worse the last 3-4 wks    Time 3    Period  Weeks    Status On-going    Target Date 06/08/20             OT Long Term Goals - 05/27/20 1006      OT LONG TERM GOAL #1   Title L hand digits flexion increase for pt to touch palm to hold utencils , steering wheel    Baseline cannot maintain progress    Time 2    Period Weeks    Status On-going    Target Date 06/08/20      OT LONG TERM GOAL #2   Title L wrist AROM and strength increase to same as R to push up from chair , turn doorknob,  carry 10 lbs without increase symptoms    Baseline Pt can push up , and turndoorknob - but not gripping or carry anything with weight - show decrease wrist flexion -cont pain at wrist because of stiffness in digits    Time 2    Period Weeks    Status On-going    Target Date 06/08/20      OT LONG TERM GOAL #3   Title L grip strength and prehension increase to more than 50% compare to R hand and no increase symptoms    Baseline pain 6/10 -with gripping , making fist - cannot make fist - ext and flexion decrease in digits - see flowsheet- grip increased - but decrease again the last week    Time 2    Period Weeks    Status On-going    Target Date 06/08/20      OT LONG TERM GOAL #4   Title PRWHE function and  pain score improve with more than 15 points    Baseline PRWHE for pain 25/5o and function 23/50 at eval - assess next time    Time 2    Period Weeks    Status On-going    Target Date 06/08/20                 Plan - 07/06/20 1213    Clinical Impression Statement Pt over 5 months out from injury - pt shows increase grip and prehension strength- flexion of digits about same -but lost some PIP extention and increase pain - pt to hold off on any putty - and focus on paraffin with MC flexion knuckle bender splint , and LMB splints for PIP extention - alternate - and increase use - will follow up in 3 wks    OT Occupational Profile and History Problem Focused Assessment - Including review of records relating to presenting problem     Occupational performance deficits (Please refer to evaluation for details): ADL's;IADL's;Work;Play;Leisure;Social Participation    Body Structure / Function / Physical Skills ADL;Decreased knowledge of precautions;Flexibility;ROM;UE functional use;Scar mobility;FMC;Dexterity;Edema;Pain;Strength;IADL    Rehab Potential Good    Clinical Decision Making Limited treatment options, no task modification necessary    Comorbidities Affecting Occupational Performance: None    Modification or Assistance to Complete Evaluation  No modification of tasks or assist necessary to complete eval    OT Frequency --   3 wks   OT Duration --   3 wks   OT Treatment/Interventions Self-care/ADL training;Therapeutic exercise;Patient/family education;Splinting;Paraffin;Fluidtherapy;Contrast Bath;Passive range of motion;Manual Therapy;Scar mobilization    Plan assess progress with HEP    OT Home Exercise Plan see pt instruction    Consulted and Agree  with Plan of Care Patient           Patient will benefit from skilled therapeutic intervention in order to improve the following deficits and impairments:   Body Structure / Function / Physical Skills: ADL, Decreased knowledge of precautions, Flexibility, ROM, UE functional use, Scar mobility, FMC, Dexterity, Edema, Pain, Strength, IADL       Visit Diagnosis: Scar tissue  Stiffness of left hand, not elsewhere classified  Pain in left wrist  Stiffness of left wrist, not elsewhere classified  Pain in left hand  Muscle weakness (generalized)    Problem List Patient Active Problem List   Diagnosis Date Noted  . Benign prostatic hyperplasia with lower urinary tract symptoms 07/31/2019  . Elevated PSA 06/18/2018  . Vitamin D deficiency 01/18/2018  . Esophageal reflux 04/20/2014  . Hypertension 04/20/2014  . Sleep apnea 04/20/2014  . Dyslipidemia 04/07/2014  . Microalbuminuria 04/07/2014  . Obesity, unspecified 04/07/2014  . Hyperlipidemia due to type 2  diabetes mellitus ([place]) 04/07/2014  . 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 Type 2 diabetes mellitus with diabetic polyneuropathy, with long-term current use of insulin ([place]) 01/08/2014    Rosalyn Gess OTR/L,CLT 07/06/2020, 12:17 PM  [facility] [address], [place], [address] Phone: [phone]   Fax:  [phone]  Name: SALVADOR COUPE MRN: [id number] Date of Birth: 10/05/1950

## 2020-07-19 ENCOUNTER — Other Ambulatory Visit: Payer: Self-pay | Admitting: Family Medicine

## 2020-07-19 DIAGNOSIS — R972 Elevated prostate specific antigen [PSA]: Secondary | ICD-10-CM

## 2020-07-27 ENCOUNTER — Other Ambulatory Visit: Payer: Self-pay

## 2020-07-27 ENCOUNTER — Ambulatory Visit: Payer: BC Managed Care – PPO | Admitting: Occupational Therapy

## 2020-07-27 DIAGNOSIS — M25642 Stiffness of left hand, not elsewhere classified: Secondary | ICD-10-CM

## 2020-07-27 DIAGNOSIS — M25532 Pain in left wrist: Secondary | ICD-10-CM

## 2020-07-27 DIAGNOSIS — M6281 Muscle weakness (generalized): Secondary | ICD-10-CM

## 2020-07-27 DIAGNOSIS — M25632 Stiffness of left wrist, not elsewhere classified: Secondary | ICD-10-CM

## 2020-07-27 DIAGNOSIS — L905 Scar conditions and fibrosis of skin: Secondary | ICD-10-CM | POA: Diagnosis not present

## 2020-07-27 DIAGNOSIS — M79642 Pain in left hand: Secondary | ICD-10-CM

## 2020-07-27 NOTE — Therapy (Signed)
[facility] [address], [place], [address] Phone: [phone]   Fax:  [phone]  Occupational Therapy Treatment  Patient Details  Name: Dylan Pittman MRN: [id number] Date of Birth: 05-23-1951 Referring Provider (OT): Dr Rudene Christians   Encounter Date: 07/27/2020   OT End of Session - 07/27/20 1052    Visit Number 18    Number of Visits 20    Date for OT Re-Evaluation 08/17/20    OT Start Time 1020    OT Stop Time 1113    OT Time Calculation (min) 53 min    Activity Tolerance Patient tolerated treatment well    Behavior During Therapy [facility] for tasks assessed/performed           Past Medical History:  Diagnosis Date  . Chronic airway obstruction ([place])   . DDD (degenerative disc disease), lumbar   . Dupuytren's disease   . Dyslipidemia 04/07/2014  . Esophageal reflux 04/20/2014  . Hypertension 04/20/2014  . Microalbuminuria 04/07/2014  . Microalbuminuria   . Obesity, unspecified 04/07/2014  . Sleep apnea 04/20/2014  . 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 BACK SURGERY  1992, 2005  . CLOSED REDUCTION FINGER WITH PERCUTANEOUS PINNING Left 02/12/2020   Procedure: Closed reduction and pinning of left first metacarpal fracture with possible open reduction;  Surgeon: Hessie Knows, MD;  Location: [facility] ORS;  Service: Orthopedics;  Laterality: Left;  . COLONOSCOPY WITH PROPOFOL N/A 12/28/2017   Procedure: COLONOSCOPY WITH PROPOFOL;  Surgeon: Manya Silvas, MD;  Location: [facility] ENDOSCOPY;  Service: Endoscopy;  Laterality: N/A;  . NASAL SEPTUM SURGERY    . VASECTOMY  2005    There were no vitals filed for this visit.   Subjective Assessment - 07/27/20 1049    Subjective  Seen Dr Rudene Christians - cant give me steroids -told me to do paraffin about 5 x day- but I can only do it about 4 x day - still swelling over the knuckles, and pain  over my thumb , hand and wrist - about 3/10 most of the time    Pertinent History Pt went to ER after sustaining left hand fracture 02/06/2020 patient was fixing a tire up with air, tire exploded and the rim hit his hand. He suffered a fracture along the base of the left first metacarpal at the base of the thumb as well as several lacerations throughout the left hand. Lacerations were repaired on 3rd and 4th dorsal PIP's except for the left palm this was left open to allow to heal. . Patient took antibiotics. Pt had thumb metacarpal pinning done 6/17 by Dr Rudene Christians , pins come out on 03/05/2020  He is L hand dominant. 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    Currently in Pain? Yes    Pain Score 3     Pain Location Hand    Pain Orientation Left    Pain Descriptors / Indicators Aching    Pain Type Chronic  pain    Pain Onset More than a month ago    Pain Frequency Intermittent              [facility] OT Assessment - 07/27/20 0001      AROM   Left Wrist Extension 60 Degrees    Left Wrist Flexion 65 Degrees      Strength   Right Hand Grip (lbs) 74    Right Hand Lateral Pinch 24 lbs    Right Hand 3 Point Pinch 26 lbs    Left Hand Grip (lbs) 44    Left Hand Lateral Pinch 19 lbs    Left Hand 3 Point Pinch 18 lbs      Left Hand AROM   L Index  MCP 0-90 65 Degrees    L Index PIP 0-100 80 Degrees    L Long  MCP 0-90 65 Degrees    L Long PIP 0-100 80 Degrees   -25 but in session to -20   L Ring  MCP 0-90 70 Degrees    L Ring PIP 0-100 90 Degrees   -40 but in session -30   L Little  MCP 0-90 70 Degrees    L Little PIP 0-100 90 Degrees   -30 but in session -20           Measurement taken and compare to about 3 wks ago - pt show decrease extention and flexion of digits- tight and fibrotic  Grip decrease and pain increase to 5/10  Prehension strength compare to R hand - WFL and range for his age Pain with AROM of wrist , grip and flexion of digits           OT Treatments/Exercises (OP) - 07/27/20 0001      LUE Paraffin   Number Minutes Paraffin 8 Minutes    LUE Paraffin Location Hand;Wrist    Comments decrease pain and  LMB splint  for PIP extention on 4th PIP           attempted Graston tool nr 2 sweeping and brushing on volar and dorsal forearm , wrist And fingers with extention of digits But switch this date to mini massager on volar digits , palm , thumb - with PROM of digits - great success with fibrosis in digit PIP's extention improve with 5-10 degrees  And less tightness and stiffness with flexion  recommend for pt to get mini massager to use at home after his paraffin to decrease fibrosis and motion   Pt can attempt PROM or stretch manually for MC flexion  And then composite flexion to palm or slight pull  Use LMB splint 2 x day after paraffin for PIP extention - slight pull   Enlarge grip still and use CMC neoprene splint and wrist neoprene as needed for increase functional strength and decrease pain  Return in 2-3 wks for reassessment of progress with changes to HEP -and finalize HEP to cont with           OT Education - 07/27/20 1052    Education Details progress , HEP changes and use of mini massager    Person(s) Educated Patient    Methods Explanation;Demonstration;Tactile cues;Verbal cues;Handout    Comprehension Verbal cues required;Returned demonstration;Verbalized understanding            OT Short Term Goals - 07/27/20 1230      OT SHORT TERM GOAL #1   Title Pt to be independent in HEP to decrease pain to less than  3/10 at the worse and AROM in digits and wrist to squeeze toothpaste, turning  doorknob    Baseline decrease digits in flexion , extention see flowsheet- and wrist decrease -pain cont to limit pt - can increase still to 5/10    Time 3    Period Weeks    Status On-going    Target Date 08/17/20      OT SHORT TERM GOAL #2   Title L hand digits extention inrease to WNL to get hand in pocket and gloves    Baseline extention of PIP's -20 to -40 degrees - progress some - but not getting worse the last 3-4 wks    Time 3    Period Weeks    Status On-going    Target Date 08/17/20             OT Long Term Goals - 07/27/20 1231      OT LONG TERM GOAL #1   Title L hand digits flexion increase for pt to touch palm to hold utencils , steering wheel    Baseline cannot touch palm - had to anlarge grip -    Time 3    Period Weeks    Status On-going    Target Date 08/17/20      OT LONG TERM GOAL #2   Title L wrist AROM and strength increase to same as R to push up from chair , turn doorknob,  carry 10 lbs without increase symptoms    Baseline Pt can push up , and turndoorknob - but not gripping or carry anything with weight - show decrease wrist flexion -cont pain at wrist because of stiffness in digits    Time 3    Period Weeks    Status On-going    Target Date 08/17/20      OT LONG TERM GOAL #3   Title L grip strength and prehension increase to more than 50% compare to R hand and no increase symptoms    Baseline prehension in range compare to R hand for his age - grip decrease -but more than 60% - but pain increase with grip 5/10    Time 3    Period Weeks    Status On-going    Target Date 08/17/20      OT LONG TERM GOAL #4   Title PRWHE function and  pain score improve with more than 15 points    Baseline PRWHE for pain 25/5o and function 23/50 at eval - still impaired    Time 3    Period Weeks    Status On-going    Target Date 08/17/20                 Plan - 07/27/20 1227    Clinical Impression Statement Pt is close to 6 months out from injury -  pt arrive with sever stiffness in digits flexion , extention - increase pain in hand and wrist with AROM, grip and wrist AROM and use - decrease grip strength -reed pt on use of paraffin and ROM/splint use - did had great success this date in session with using mini massager on volar digits , palm to decrease fibrosis for pt to show increase flexion and extention of digits- recommend for pt to get one for use at home after paraffin - to cont with HEP for 2-3 wks and return for one more session to assess if pt can make progress with HEP    OT Occupational Profile and History  Problem Focused Assessment - Including review of records relating to presenting problem    Occupational performance deficits (Please refer to evaluation for details): ADL's;IADL's;Work;Play;Leisure;Social Participation    Body Structure / Function / Physical Skills ADL;Decreased knowledge of precautions;Flexibility;ROM;UE functional use;Scar mobility;FMC;Dexterity;Edema;Pain;Strength;IADL    Rehab Potential Good    Clinical Decision Making Limited treatment options, no task modification necessary    Comorbidities Affecting Occupational Performance: None    Modification or Assistance to Complete Evaluation  No modification of tasks or assist necessary to complete eval    OT Frequency --   2-3 wks one more visit   OT Duration 4 weeks    OT Treatment/Interventions Self-care/ADL training;Therapeutic exercise;Patient/family education;Splinting;Paraffin;Fluidtherapy;Contrast Bath;Passive range of motion;Manual Therapy;Scar mobilization    Plan assess progress with HEP    OT Home Exercise Plan see pt instruction    Consulted and Agree with Plan of Care Patient           Patient will benefit from skilled therapeutic intervention in order to improve the following deficits and impairments:   Body Structure / Function / Physical Skills: ADL, Decreased knowledge of precautions, Flexibility, ROM, UE functional use, Scar mobility, FMC,  Dexterity, Edema, Pain, Strength, IADL       Visit Diagnosis: Stiffness of left hand, not elsewhere classified - Plan: Ot plan of care cert/re-cert  Pain in left wrist - Plan: Ot plan of care cert/re-cert  Stiffness of left wrist, not elsewhere classified - Plan: Ot plan of care cert/re-cert  Pain in left hand - Plan: Ot plan of care cert/re-cert  Muscle weakness (generalized) - Plan: Ot plan of care cert/re-cert    Problem List Patient Active Problem List   Diagnosis Date Noted  . Benign prostatic hyperplasia with lower urinary tract symptoms 07/31/2019  . Elevated PSA 06/18/2018  . Vitamin D deficiency 01/18/2018  . Esophageal reflux 04/20/2014  . Hypertension 04/20/2014  . Sleep apnea 04/20/2014  . Dyslipidemia 04/07/2014  . Microalbuminuria 04/07/2014  . Obesity, unspecified 04/07/2014  . Hyperlipidemia due to type 2 diabetes mellitus ([place]) 04/07/2014  . 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 Type 2 diabetes mellitus with diabetic polyneuropathy, with long-term current use of insulin ([place]) 01/08/2014    Rosalyn Gess OTR/L,CLT 07/27/2020, 12:37 PM  [facility] [address], [place], [address] Phone: [phone]   Fax:  [phone]  Name: LEWAYNE PAULEY MRN: [id number] Date of Birth: 05/27/1951

## 2020-07-29 ENCOUNTER — Other Ambulatory Visit: Payer: Self-pay

## 2020-07-29 ENCOUNTER — Other Ambulatory Visit: Payer: BC Managed Care – PPO

## 2020-07-29 DIAGNOSIS — R972 Elevated prostate specific antigen [PSA]: Secondary | ICD-10-CM

## 2020-07-30 LAB — PSA: Prostate Specific Ag, Serum: 13.8 ng/mL — ABNORMAL HIGH (ref 0.0–4.0)

## 2020-08-04 ENCOUNTER — Other Ambulatory Visit: Payer: Self-pay

## 2020-08-04 ENCOUNTER — Ambulatory Visit (INDEPENDENT_AMBULATORY_CARE_PROVIDER_SITE_OTHER): Payer: BC Managed Care – PPO | Admitting: Urology

## 2020-08-04 ENCOUNTER — Encounter: Payer: Self-pay | Admitting: Urology

## 2020-08-04 VITALS — BP 146/77 | HR 96 | Ht 68.0 in | Wt 228.0 lb

## 2020-08-04 DIAGNOSIS — N401 Enlarged prostate with lower urinary tract symptoms: Secondary | ICD-10-CM | POA: Diagnosis not present

## 2020-08-04 DIAGNOSIS — R972 Elevated prostate specific antigen [PSA]: Secondary | ICD-10-CM

## 2020-08-04 MED ORDER — TAMSULOSIN HCL 0.4 MG PO CAPS
0.4000 mg | ORAL_CAPSULE | Freq: Every day | ORAL | 0 refills | Status: DC
Start: 2020-08-04 — End: 2020-08-31

## 2020-08-04 NOTE — Progress Notes (Signed)
08/04/2020 1:19 PM   Dylan Pittman 08/27/1951 [id number]  Referring provider: Sofie Hartigan, MD [place],  [place] [address]  Chief Complaint  Patient presents with  . Elevated PSA    Urologic history: 1.Elevated PSA -TRUS/biopsy April 2019; PSA 9.7; 38 g prostate -Pathology benign prostate tissue with multiple cores showing focal chronic inflammation   HPI: 69 y.o. male presents for annual follow-up.   Was seen for an acute visit August 2021 with right back and scrotal pain  Renal stone protocol CT showed no calculi, hydronephrosis or GU abnormalities and was felt pain most likely musculoskeletal in etiology  States pain resolved after 3 days  Has moderate lower urinary tract symptoms  Denies dysuria, gross hematuria  No flank, abdominal or pelvic pain  PSA 07/29/2020 increased above baseline at 13.8   PMH: Past Medical History:  Diagnosis Date  . Chronic airway obstruction ([place])   . DDD (degenerative disc disease), lumbar   . Dupuytren's disease   . Dyslipidemia 04/07/2014  . Esophageal reflux 04/20/2014  . Hypertension 04/20/2014  . Microalbuminuria 04/07/2014  . Microalbuminuria   . Obesity, unspecified 04/07/2014  . Sleep apnea 04/20/2014  . 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 BACK SURGERY  1992, 2005  . CLOSED REDUCTION FINGER WITH PERCUTANEOUS PINNING Left 02/12/2020   Procedure: Closed reduction and pinning of left first metacarpal fracture with possible open reduction;  Surgeon: Hessie Knows, MD;  Location: [facility] ORS;  Service: Orthopedics;  Laterality: Left;  . COLONOSCOPY WITH PROPOFOL N/A 12/28/2017   Procedure: COLONOSCOPY WITH PROPOFOL;  Surgeon: Manya Silvas, MD;  Location: [facility] ENDOSCOPY;  Service: Endoscopy;  Laterality: N/A;  . NASAL SEPTUM SURGERY    . VASECTOMY  2005    Home Medications:  Allergies as of  08/04/2020      Reactions   Pravastatin Other (See Comments)   Muscle pain      Medication List       Accurate as of August 04, 2020  1:19 PM. If you have any questions, ask your nurse or doctor.        albuterol 108 (90 Base) MCG/ACT inhaler Commonly known as: VENTOLIN HFA Inhale 2 puffs into the lungs every 6 (six) hours as needed.   ASPIRIN 81 PO Take 81 mg by mouth daily.   BD Pen Needle Nano U/F 32G X 4 MM Misc Generic drug: Insulin Pen Needle U QID   Dapagliflozin-metFORMIN HCl ER 12-998 MG Tb24 Take by mouth.   glimepiride 4 MG tablet Commonly known as: AMARYL Take 4 mg by mouth daily.   glucagon 1 MG injection Inject 1 mg into the skin once as needed (low bloos glucose).   glucose blood test strip Use 1 strip via meter three times daily as directed  ONE TOUCH ULTRA E11.65   Gvoke PFS 1 MG/0.2ML Sosy Generic drug: Glucagon Inject into the skin.   HYDROcodone-acetaminophen 5-325 MG tablet Commonly known as: Norco Take 1 tablet by mouth every 4 (four) hours as needed.   Invokamet XR 617 [id number] MG Tb24 Generic drug: Canagliflozin-metFORMIN HCl ER Take 2 tablets by mouth daily.   losartan 50 MG tablet Commonly known as: COZAAR Take 50 mg by mouth daily.   Ozempic (1 MG/DOSE) 4 MG/3ML Sopn Generic drug: Semaglutide (1 MG/DOSE) Inject 4 mg into the skin once a week. Sunday   pantoprazole 40 MG tablet Commonly  known as: PROTONIX Take 40 mg by mouth daily.   pioglitazone 30 MG tablet Commonly known as: ACTOS Take 30 mg by mouth daily.   rosuvastatin 5 MG tablet Commonly known as: CRESTOR Take 5 mg by mouth every Monday, Wednesday, and Friday.   sulfamethoxazole-trimethoprim 800-160 MG tablet Commonly known as: BACTRIM DS Take 1 tablet by mouth 2 (two) times daily.       Allergies:  Allergies  Allergen Reactions  . Pravastatin Other (See Comments)    Muscle pain    Family History: Family History  Problem Relation Age of Onset  .  Prostate cancer Neg Hx   . Kidney cancer Neg Hx     Social History:  reports that he has quit smoking. He has never used smokeless tobacco. He reports previous alcohol use. He reports previous drug use.   Physical Exam: BP (!) 146/77   Pulse 96   Ht 5\' 8"  (1.727 m)   Wt 228 lb (103.4 kg)   BMI 34.67 kg/m   Constitutional:  Alert and oriented, No acute distress. HEENT: [place] AT, moist mucus membranes.  Trachea midline, no masses. Cardiovascular: No clubbing, cyanosis, or edema. Respiratory: Normal respiratory effort, no increased work of breathing. GI: Abdomen is soft, nontender, nondistended, no abdominal masses GU: Prostate 40 g, smooth without nodules Skin: No rashes, bruises or suspicious lesions. Neurologic: Grossly intact, no focal deficits, moving all 4 extremities. Psychiatric: Normal mood and affect.   Assessment & Plan:    1.  Elevated PSA  PSA significant elevated above baseline  Benign DRE  Urinalysis today  If no evidence of infection trial tamsulosin 0.4 mg daily x30 days with a repeat PSA in 1 month  Prostate MRI if PSA persistently elevated above baseline  He indicated he would request antianxiety medication if MRI needed  2.  BPH with LUTS  We discussed that urinary symptoms may improve on tamsulosin and could continue if there is significant efficacy   Abbie Sons, MD  [facility] [address], [place], [place] [address] [phone]

## 2020-08-05 LAB — MICROSCOPIC EXAMINATION
Bacteria, UA: NONE SEEN
WBC, UA: NONE SEEN /hpf (ref 0–5)

## 2020-08-05 LAB — URINALYSIS, COMPLETE
Bilirubin, UA: NEGATIVE
Ketones, UA: NEGATIVE
Leukocytes,UA: NEGATIVE
Nitrite, UA: NEGATIVE
Protein,UA: NEGATIVE
RBC, UA: NEGATIVE
Specific Gravity, UA: <1.005 — ABNORMAL LOW (ref 1.005–1.030)
Urobilinogen, Ur: 0.2 mg/dL (ref 0.2–1.0)
pH, UA: 5.5 (ref 5.0–7.5)

## 2020-08-13 ENCOUNTER — Ambulatory Visit: Payer: BC Managed Care – PPO | Attending: Orthopedic Surgery | Admitting: Occupational Therapy

## 2020-08-13 ENCOUNTER — Other Ambulatory Visit: Payer: Self-pay

## 2020-08-13 DIAGNOSIS — L905 Scar conditions and fibrosis of skin: Secondary | ICD-10-CM | POA: Diagnosis present

## 2020-08-13 DIAGNOSIS — M25632 Stiffness of left wrist, not elsewhere classified: Secondary | ICD-10-CM | POA: Diagnosis present

## 2020-08-13 DIAGNOSIS — M79642 Pain in left hand: Secondary | ICD-10-CM | POA: Diagnosis present

## 2020-08-13 DIAGNOSIS — M25532 Pain in left wrist: Secondary | ICD-10-CM | POA: Diagnosis not present

## 2020-08-13 DIAGNOSIS — M6281 Muscle weakness (generalized): Secondary | ICD-10-CM | POA: Diagnosis present

## 2020-08-13 DIAGNOSIS — M25642 Stiffness of left hand, not elsewhere classified: Secondary | ICD-10-CM | POA: Insufficient documentation
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
## 2020-08-31 ENCOUNTER — Other Ambulatory Visit: Payer: Self-pay | Admitting: Urology

## 2020-09-08 ENCOUNTER — Other Ambulatory Visit: Payer: BC Managed Care – PPO

## 2020-09-08 ENCOUNTER — Other Ambulatory Visit: Payer: Self-pay

## 2020-09-08 DIAGNOSIS — R972 Elevated prostate specific antigen [PSA]: Secondary | ICD-10-CM

## 2020-09-09 ENCOUNTER — Telehealth: Payer: Self-pay | Admitting: Urology

## 2020-09-09 DIAGNOSIS — R972 Elevated prostate specific antigen [PSA]: Secondary | ICD-10-CM

## 2020-09-09 LAB — PSA: Prostate Specific Ag, Serum: 12 ng/mL — ABNORMAL HIGH (ref 0.0–4.0)

## 2020-09-09 NOTE — Telephone Encounter (Signed)
PSA remains elevated above baseline at 12.0.  Recommend prostate MRI.  Order entered.

## 2020-09-11 ENCOUNTER — Other Ambulatory Visit: Payer: Self-pay | Admitting: Urology

## 2020-09-11 MED ORDER — DIAZEPAM 5 MG PO TABS
ORAL_TABLET | ORAL | 0 refills | Status: DC
Start: 2020-09-11 — End: 2021-03-11

## 2020-10-05 ENCOUNTER — Other Ambulatory Visit: Payer: Self-pay

## 2020-10-05 ENCOUNTER — Ambulatory Visit
Admission: RE | Admit: 2020-10-05 | Discharge: 2020-10-05 | Disposition: A | Discharge status: Home / Self Care | Payer: BC Managed Care – PPO | Source: Ambulatory Visit | Attending: Urology | Admitting: Urology

## 2020-10-05 DIAGNOSIS — R972 Elevated prostate specific antigen [PSA]: Secondary | ICD-10-CM | POA: Insufficient documentation

## 2020-10-05 MED ORDER — GADOBUTROL 1 MMOL/ML IV SOLN
10.0000 mL | Freq: Once | INTRAVENOUS | Status: AC | PRN
  Administered 2020-10-05: 10 mL via INTRAVENOUS

## 2020-10-07 ENCOUNTER — Other Ambulatory Visit: Payer: Self-pay | Admitting: Urology

## 2020-10-07 ENCOUNTER — Encounter: Payer: Self-pay | Admitting: Urology

## 2020-10-29 ENCOUNTER — Telehealth: Payer: Self-pay | Admitting: *Deleted

## 2020-10-29 ENCOUNTER — Encounter: Payer: Self-pay | Admitting: *Deleted

## 2020-10-29 NOTE — Telephone Encounter (Signed)
Went over instruction with patient. He understands. He is not taking aspirin 81

## 2020-11-03 ENCOUNTER — Other Ambulatory Visit: Payer: Self-pay | Admitting: Urology

## 2020-11-05 ENCOUNTER — Other Ambulatory Visit: Payer: Self-pay

## 2020-11-05 ENCOUNTER — Ambulatory Visit (INDEPENDENT_AMBULATORY_CARE_PROVIDER_SITE_OTHER): Payer: BC Managed Care – PPO | Admitting: Urology

## 2020-11-05 VITALS — BP 146/80 | HR 98 | Ht 69.0 in | Wt 226.0 lb

## 2020-11-05 DIAGNOSIS — R972 Elevated prostate specific antigen [PSA]: Secondary | ICD-10-CM | POA: Diagnosis not present

## 2020-11-05 MED ORDER — CEFTRIAXONE SODIUM 1 G IJ SOLR
1.0000 g | Freq: Once | INTRAMUSCULAR | Status: AC
  Administered 2020-11-05: 1 g via INTRAMUSCULAR

## 2020-11-05 NOTE — Progress Notes (Signed)
Prostate Biopsy Procedure   Informed consent was obtained after discussing risks/benefits of the procedure.  A time out was performed to ensure correct patient identity.  Pre-Procedure: - Last PSA Level: 13.8 - MRI PI-RADS 5 lesion occupying the majority of the transition zone favoring the right side.  For cognitive biopsy - Gentamicin given prophylactically - Levaquin 500 mg administered PO -Transrectal Ultrasound performed revealing a 45 gm prostate - TZ diffusely hypoechoic  Procedure: - Prostate block performed using 10 cc 1% lidocaine and biopsies taken from sextant areas, and additional cores from the transition zone, a total of 16 under ultrasound guidance.  Post-Procedure: - Patient tolerated the procedure well - He was counseled to seek immediate medical attention if experiences any severe pain, significant bleeding, or fevers -Will call with biopsy results   John Giovanni, MD

## 2020-11-08 LAB — SURGICAL PATHOLOGY

## 2020-11-15 ENCOUNTER — Telehealth: Payer: Self-pay | Admitting: Urology

## 2020-11-15 DIAGNOSIS — C61 Malignant neoplasm of prostate: Secondary | ICD-10-CM

## 2020-11-15 NOTE — Telephone Encounter (Signed)
Call patient to discuss prostate biopsy report.  Left VM to call back.

## 2020-11-15 NOTE — Telephone Encounter (Signed)
Pt said you can reach him anytime tomorrow to discuss biopsy

## 2020-11-16 NOTE — Telephone Encounter (Signed)
I contacted Mr. Maxson regarding his prostate biopsy report.  He underwent cognitive biopsy for PI-RADS 5 lesion 11/05/2020.  No postbiopsy complaints.  PSA 13.8, 45 g gland on TRUS; 16 cores obtained  Pathology: 3 cores with Gleason 4+3 adenocarcinoma and one core with Gleason 3+4 adenocarcinoma as below.     Impression:  1.  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

## 2020-11-19 ENCOUNTER — Ambulatory Visit: Payer: BC Managed Care – PPO | Admitting: Urology

## 2020-12-01 ENCOUNTER — Other Ambulatory Visit: Payer: Self-pay | Admitting: Urology

## 2020-12-02 ENCOUNTER — Encounter: Admission: RE | Admit: 2020-12-02 | Payer: BC Managed Care – PPO | Source: Ambulatory Visit

## 2020-12-07 ENCOUNTER — Encounter
Admission: RE | Admit: 2020-12-07 | Discharge: 2020-12-07 | Disposition: A | Discharge status: Home / Self Care | Payer: BC Managed Care – PPO | Source: Ambulatory Visit | Attending: Urology | Admitting: Urology

## 2020-12-07 ENCOUNTER — Other Ambulatory Visit: Payer: Self-pay

## 2020-12-07 DIAGNOSIS — C61 Malignant neoplasm of prostate: Secondary | ICD-10-CM | POA: Insufficient documentation

## 2020-12-07 MED ORDER — TECHNETIUM TC 99M MEDRONATE IV KIT
20.0000 | PACK | Freq: Once | INTRAVENOUS | Status: AC | PRN
  Administered 2020-12-07: 20.026 via INTRAVENOUS

## 2020-12-11 ENCOUNTER — Encounter: Payer: Self-pay | Admitting: Urology

## 2021-01-04 ENCOUNTER — Other Ambulatory Visit: Payer: Self-pay | Admitting: Urology

## 2021-01-04 NOTE — Telephone Encounter (Signed)
Patient called back to let you know that he would like to be referred to [facility]. I will go ahead and send the referral. You don't need to do anything, just wanted to make you aware.   Sharyn Lull

## 2021-01-24 DIAGNOSIS — C61 Malignant neoplasm of prostate: Secondary | ICD-10-CM | POA: Insufficient documentation

## 2021-02-16 ENCOUNTER — Other Ambulatory Visit: Payer: Self-pay | Admitting: Urology

## 2021-02-16 DIAGNOSIS — C61 Malignant neoplasm of prostate: Secondary | ICD-10-CM

## 2021-02-16 NOTE — Progress Notes (Signed)
Radiation oncology referral

## 2021-02-23 ENCOUNTER — Ambulatory Visit
Admission: RE | Admit: 2021-02-23 | Discharge: 2021-02-23 | Disposition: A | Discharge status: Home / Self Care | Payer: BC Managed Care – PPO | Source: Ambulatory Visit | Attending: Radiation Oncology | Admitting: Radiation Oncology

## 2021-02-23 ENCOUNTER — Other Ambulatory Visit: Payer: Self-pay

## 2021-02-23 ENCOUNTER — Encounter: Payer: Self-pay | Admitting: Radiation Oncology

## 2021-02-23 DIAGNOSIS — K219 Gastro-esophageal reflux disease without esophagitis: Secondary | ICD-10-CM | POA: Diagnosis not present

## 2021-02-23 DIAGNOSIS — R809 Proteinuria, unspecified: Secondary | ICD-10-CM | POA: Diagnosis not present

## 2021-02-23 DIAGNOSIS — E669 Obesity, unspecified: Secondary | ICD-10-CM | POA: Insufficient documentation

## 2021-02-23 DIAGNOSIS — M720 Palmar fascial fibromatosis [Dupuytren]: Secondary | ICD-10-CM | POA: Diagnosis not present

## 2021-02-23 DIAGNOSIS — E785 Hyperlipidemia, unspecified: Secondary | ICD-10-CM | POA: Insufficient documentation

## 2021-02-23 DIAGNOSIS — E1140 Type 2 diabetes mellitus with diabetic neuropathy, unspecified: Secondary | ICD-10-CM | POA: Insufficient documentation

## 2021-02-23 DIAGNOSIS — E1165 Type 2 diabetes mellitus with hyperglycemia: Secondary | ICD-10-CM | POA: Diagnosis not present

## 2021-02-23 DIAGNOSIS — G4730 Sleep apnea, unspecified: Secondary | ICD-10-CM | POA: Insufficient documentation

## 2021-02-23 DIAGNOSIS — M5136 Other intervertebral disc degeneration, lumbar region: Secondary | ICD-10-CM | POA: Insufficient documentation

## 2021-02-23 DIAGNOSIS — C61 Malignant neoplasm of prostate: Secondary | ICD-10-CM | POA: Insufficient documentation

## 2021-02-23 DIAGNOSIS — I10 Essential (primary) hypertension: Secondary | ICD-10-CM | POA: Diagnosis not present

## 2021-02-23 NOTE — Consult Note (Signed)
NEW PATIENT EVALUATION  Name: Dylan Pittman  MRN: [id number]  Date:   02/23/2021     DOB: 29-Jul-1951   This 70 y.o. male patient presents to the clinic for initial evaluation of stage IIa (T1 cN0 M0) Gleason 7 (4+3) presenting with a PSA in the 12 range.  REFERRING PHYSICIAN: Sofie Hartigan, MD  CHIEF COMPLAINT:  Chief Complaint  Patient presents with   Prostate Cancer    Initial consultation    DIAGNOSIS: The encounter diagnosis was Prostate cancer [facility]).   PREVIOUS INVESTIGATIONS:  Pathology report reviewed Bone scan and MRI scan reviewed Clinical notes reviewed  HPI: Patient is a 70 year old male who is been followed by Dr. Bernardo Heater for scrotal pain and some moderate lower urinary tract symptoms.  His PSA has been noted to increase starting back 3 years prior at 9.7 most recently it is 12.0.  He underwent an MRI scan of his prostate showing bland lesion in the transitional zone concerning for high-grade carcinoma.  No evidence of extracapsular extension or lymphadenopathy was noted seminal vesicles were normal.  He underwent transrectal ultrasound-guided biopsy which was positive with 4 out of 12 cores for mostly Gleason 7 (4+3).  Bone scan was also performed showing no evidence to suggest metastatic disease.  Patient does have nocturia x3 some slight urgency and frequency.  He has been advised of his treatment options as far as surgery is concerned is now referred to radiation oncology for consideration of treatment.  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    PAST SURGICAL HISTORY:  Past Surgical History:  Procedure Laterality Date   BACK SURGERY  1992, 2005   CLOSED REDUCTION FINGER WITH PERCUTANEOUS PINNING Left 02/12/2020   Procedure: Closed reduction and pinning of left first metacarpal fracture with possible open reduction;  Surgeon: Hessie Knows, MD;  Location: [facility] ORS;  Service: Orthopedics;  Laterality: Left;   COLONOSCOPY WITH PROPOFOL N/A 12/28/2017   Procedure: COLONOSCOPY WITH PROPOFOL;  Surgeon: Manya Silvas, MD;  Location: [facility] ENDOSCOPY;  Service: Endoscopy;  Laterality: N/A;   NASAL SEPTUM SURGERY     VASECTOMY  2005    FAMILY HISTORY: family history is not on file.  SOCIAL HISTORY:  reports that he has quit smoking. He has never used smokeless tobacco. He reports previous alcohol use. He reports previous drug use.  ALLERGIES: Pravastatin  MEDICATIONS:  Current Outpatient Medications  Medication Sig Dispense Refill   albuterol (VENTOLIN HFA) 108 (90 Base) MCG/ACT inhaler Inhale 2 puffs into the lungs every 6 (six) hours as needed.     ASPIRIN 81 PO Take 81 mg by mouth daily.      glimepiride (AMARYL) 4 MG tablet Take 4 mg by mouth daily.     losartan (COZAAR) 50 MG tablet Take 50 mg by mouth daily.   2   OZEMPIC, 1 MG/DOSE, 4 MG/3ML SOPN Inject 4 mg into the skin once a week. Sunday     pantoprazole (PROTONIX) 40 MG tablet Take 40 mg by mouth daily.   5   pioglitazone (ACTOS) 30 MG tablet Take 30 mg by mouth daily.     rosuvastatin (CRESTOR) 5 MG tablet Take 5 mg by mouth every Monday, Wednesday, and  Friday.   3   tamsulosin (FLOMAX) 0.4 MG CAPS capsule TAKE 1 CAPSULE(0.4 MG) BY MOUTH DAILY 30 capsule 0   TOUJEO MAX SOLOSTAR 300 UNIT/ML Solostar Pen Inject into the skin.     vitamin E 180 MG (400 UNITS) capsule Take by mouth.     XIGDUO XR 12-998 MG TB24 Take 2 tablets by mouth every morning.     BD PEN NEEDLE NANO U/F 32G X 4 MM MISC U QID (Patient not taking: Reported on 02/23/2021)  1   diazepam  (VALIUM) 5 MG tablet 1 tab po 30 min prior to MRI (Patient not taking: Reported on 02/23/2021) 1 tablet 0   glucagon 1 MG injection Inject 1 mg into the skin once as needed (low bloos glucose).  (Patient not taking: Reported on 02/23/2021)     glucose blood test strip Use 1 strip via meter three times daily as directed  ONE TOUCH ULTRA E11.65 (Patient not taking: Reported on 02/23/2021)     GVOKE PFS 1 MG/0.2ML SOSY Inject into the skin. (Patient not taking: Reported on 02/23/2021)     INVOKAMET XR [phone] MG TB24 Take 2 tablets by mouth daily. (Patient not taking: Reported on 02/23/2021)     No current facility-administered medications for this encounter.    ECOG PERFORMANCE STATUS:  0 - Asymptomatic  REVIEW OF SYSTEMS: Patient denies any weight loss, fatigue, weakness, fever, chills or night sweats. Patient denies any loss of vision, blurred vision. Patient denies any ringing  of the ears or hearing loss. No irregular heartbeat. Patient denies heart murmur or history of fainting. Patient denies any chest pain or pain radiating to her upper extremities. Patient denies any shortness of breath, difficulty breathing at night, cough or hemoptysis. Patient denies any swelling in the lower legs. Patient denies any nausea vomiting, vomiting of blood, or coffee ground material in the vomitus. Patient denies any stomach pain. Patient states has had normal bowel movements no significant constipation or diarrhea. Patient denies any dysuria, hematuria or significant nocturia. Patient denies any problems walking, swelling in the joints or loss of balance. Patient denies any skin changes, loss of hair or loss of weight. Patient denies any excessive worrying or anxiety or significant depression. Patient denies any problems with insomnia. Patient denies excessive thirst, polyuria, polydipsia. Patient denies any swollen glands, patient denies easy bruising or easy bleeding. Patient denies any recent infections, allergies or  URI. Patient "s visual fields have not changed significantly in recent time.   PHYSICAL EXAM: BP (P) 119/68 (BP Location: Left Arm, Patient Position: Sitting)   Pulse (!) (P) 101   Temp (!) (P) 97.5 F (36.4 C) (Tympanic)   Resp (P) 18   Wt (P) 222 lb 8 oz (100.9 kg)   BMI (P) 32.86 kg/m  Well-developed well-nourished patient in NAD. HEENT reveals PERLA, EOMI, discs not visualized.  Oral cavity is clear. No oral mucosal lesions are identified. Neck is clear without evidence of cervical or supraclavicular adenopathy. Lungs are clear to A&P. Cardiac examination is essentially unremarkable with regular rate and rhythm without murmur rub or thrill. Abdomen is benign with no organomegaly or masses noted. Motor sensory and DTR levels are equal and symmetric in the upper and lower extremities. Cranial nerves II through XII are grossly intact. Proprioception is intact. No peripheral adenopathy or edema is identified. No motor or sensory levels are noted. Crude visual fields are within normal range.  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  Patient is in favor of implant.  Risks and benefits of treatment including increased lower urinary tract symptoms possible diarrhea fatigue and risks of general anesthesia all were reviewed with the patient and his wife.  I have also reviewed radiation safety precautions.  We will set him up for a volume study preceded by the I-125 implant.  I also like the patient to have 68-month depot of Eligard and have referred him back to [facility] office for that.  Patient comprehends her recommendations  well.  I would like to take this opportunity to thank you for allowing me to participate in the care of your patient.[name], MD

## 2021-02-24 ENCOUNTER — Telehealth: Payer: Self-pay

## 2021-02-24 NOTE — Telephone Encounter (Signed)
Benefits investigation faxed for Eliagrd/Lupron.

## 2021-03-05 ENCOUNTER — Other Ambulatory Visit: Payer: Self-pay | Admitting: Urology

## 2021-03-16 ENCOUNTER — Ambulatory Visit
Admission: RE | Admit: 2021-03-16 | Discharge: 2021-03-16 | Disposition: A | Discharge status: Home / Self Care | Payer: BC Managed Care – PPO | Source: Ambulatory Visit | Attending: Radiation Oncology | Admitting: Radiation Oncology

## 2021-03-16 ENCOUNTER — Encounter: Payer: Self-pay | Admitting: Radiation Oncology

## 2021-03-16 ENCOUNTER — Ambulatory Visit: Admission: RE | Admit: 2021-03-16 | Payer: BC Managed Care – PPO | Source: Home / Self Care

## 2021-03-16 ENCOUNTER — Telehealth: Payer: Self-pay

## 2021-03-16 VITALS — BP 142/76 | HR 77 | Temp 97.0°F | Wt 222.0 lb

## 2021-03-16 DIAGNOSIS — C61 Malignant neoplasm of prostate: Secondary | ICD-10-CM | POA: Insufficient documentation

## 2021-03-16 SURGERY — ULTRASOUND, PROSTATE, FOR VOLUME DETERMINATION
Anesthesia: Choice

## 2021-03-16 NOTE — Progress Notes (Signed)
Radiation Oncology Volume study note  Name: Dylan Pittman   Date:   03/16/2021 MRN:  [id number] DOB: 1951-02-14    This 70 y.o. male presents to the OR today for volume study in anticipation of I-125 interstitial implant for stage IIc adenocarcinoma the prostate.  REFERRING PROVIDER: Sofie Hartigan, MD  HPI: Patient is a 70 year old male who had presented with scrotal pain.  He also had prior 3 years elevation of his PSA to 9.7 which most recently is climbed to 49.  MRI scan of his prostate showed lesion in the transitional zone concerning for high-grade carcinoma no evidence of extracapsular extension lymphadenopathy was noted.  Seminal vesicles were normal.  He was status post transrectal ultrasound for a Gleason 7 (4+3).  He opted for I-125 interstitial implant.  He is now seen for volume study in anticipation of implant..  COMPLICATIONS OF TREATMENT: none  FOLLOW UP COMPLIANCE: keeps appointments   PHYSICAL EXAM:  There were no vitals taken for this visit. Well-developed well-nourished patient in NAD. HEENT reveals PERLA, EOMI, discs not visualized.  Oral cavity is clear. No oral mucosal lesions are identified. Neck is clear without evidence of cervical or supraclavicular adenopathy. Lungs are clear to A&P. Cardiac examination is essentially unremarkable with regular rate and rhythm without murmur rub or thrill. Abdomen is benign with no organomegaly or masses noted. Motor sensory and DTR levels are equal and symmetric in the upper and lower extremities. Cranial nerves II through XII are grossly intact. Proprioception is intact. No peripheral adenopathy or edema is identified. No motor or sensory levels are noted. Crude visual fields are within normal range.  RADIOLOGY RESULTS: Ultrasound used for volume study  PLAN: Patient was taken to the cystoscopy suite in the OR. Patient was placed in the low lithotomy position. Foley catheter was placed. Trans-rectal ultrasound probe was  inserted into the rectum and prostate seminal vesicles were visualized as well as bladder base. stepping images were performed on a 5 mm increments. Images will be placed in [employer] treatment planning system to determine seed placement coordinates for eventual I-125 interstitial implant. Images will be reviewed with the physics and dosimetry staff for final quality approval. I personally was present for the volume study and assisted in delineation of contour volumes.  At the end of the procedure Foley catheter was removed, rectal ultrasound probe was removed. Patient tolerated his procedures extremely well with no side effects or complaints. Patient has given appointment for interstitial implant date. Consent was signed today as well as history and physical performed in preparation for his outpatient surgical implant.     [name], MD

## 2021-03-16 NOTE — H&P (View-Only) (Signed)
H&P  Name: Dylan Pittman  MRN: [id number]  Date:   03/16/2021     DOB: [birth year]   This 70 y.o. 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 REFERRING PHYSICIAN: Sofie Hartigan, MD  CHIEF COMPLAINT:  Chief Complaint  Patient presents with   Follow-up    DIAGNOSIS: The encounter diagnosis was Prostate cancer [facility]).   PREVIOUS INVESTIGATIONS:  Volume study performed Clinical notes reviewed Pathology reviewed  HPI: Patient is a 70 year old male who had presented with scrotal pain.  He also had prior 3 years elevation of his PSA to 9.7 which most recently is climbed to 70.  MRI scan of his prostate showed lesion in the transitional zone concerning for high-grade carcinoma no evidence of extracapsular extension lymphadenopathy was noted.  Seminal vesicles were normal.  He was status post transrectal ultrasound for a Gleason 7 (4+3).  He opted for I-125 interstitial implant.  He is now seen for volume study in anticipation of implant.  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    PAST SURGICAL HISTORY:  Past Surgical History:  Procedure Laterality Date   BACK SURGERY  1992, 2005   CLOSED REDUCTION FINGER WITH PERCUTANEOUS PINNING Left 02/12/2020   Procedure: Closed reduction and pinning of left first metacarpal fracture with possible open reduction;  Surgeon: Hessie Knows, MD;   Location: [facility] ORS;  Service: Orthopedics;  Laterality: Left;   COLONOSCOPY WITH PROPOFOL N/A 12/28/2017   Procedure: COLONOSCOPY WITH PROPOFOL;  Surgeon: Manya Silvas, MD;  Location[facility] ENDOSCOPY;  Service: Endoscopy;  Laterality: N/A;   NASAL SEPTUM SURGERY     VASECTOMY  2005    FAMILY HISTORY: family history is not on file.  SOCIAL HISTORY:  reports that he has quit smoking. He has never used smokeless tobacco. He reports previous alcohol use. He reports previous drug use.  ALLERGIES: Pravastatin  MEDICATIONS:  Current Outpatient Medications  Medication Sig Dispense Refill   albuterol (VENTOLIN HFA) 108 (90 Base) MCG/ACT inhaler Inhale 2 puffs into the lungs every 6 (six) hours as needed for wheezing or shortness of breath.     aspirin EC 81 MG tablet Take 81 mg by mouth in the morning. Swallow whole.     cholecalciferol (VITAMIN D) 25 MCG (1000 UNIT) tablet Take 1,000 Units by mouth in the morning.     losartan (COZAAR) 50 MG tablet Take 50 mg by mouth in the morning.  2   OZEMPIC, 1 MG/DOSE, 4 MG/3ML SOPN Inject 1 mg into the skin every Sunday.     pantoprazole (PROTONIX) 40 MG tablet Take 40 mg by mouth in the morning.  5   pioglitazone (ACTOS) 30 MG tablet Take 30 mg by mouth in the morning.     rosuvastatin (CRESTOR) 5 MG tablet Take 5 mg by mouth every Monday, Wednesday, and Friday. In the morning  3   tamsulosin (FLOMAX) 0.4 MG CAPS capsule TAKE 1 CAPSULE(0.4 MG) BY MOUTH DAILY (Patient taking differently: Take 0.4 mg by mouth in the morning.) 30 capsule 0   TOUJEO  MAX SOLOSTAR 300 UNIT/ML Solostar Pen Inject 40 Units into the skin in the morning.     XIGDUO XR 12-998 MG TB24 Take 2 tablets by mouth every morning.     No current facility-administered medications for this encounter.    ECOG PERFORMANCE STATUS:  0 - Asymptomatic  REVIEW OF SYSTEMS: Patient denies any weight loss, fatigue, weakness, fever, chills or night sweats. Patient denies any loss of vision,  blurred vision. Patient denies any ringing  of the ears or hearing loss. No irregular heartbeat. Patient denies heart murmur or history of fainting. Patient denies any chest pain or pain radiating to her upper extremities. Patient denies any shortness of breath, difficulty breathing at night, cough or hemoptysis. Patient denies any swelling in the lower legs. Patient denies any nausea vomiting, vomiting of blood, or coffee ground material in the vomitus. Patient denies any stomach pain. Patient states has had normal bowel movements no significant constipation or diarrhea. Patient denies any dysuria, hematuria or significant nocturia. Patient denies any problems walking, swelling in the joints or loss of balance. Patient denies any skin changes, loss of hair or loss of weight. Patient denies any excessive worrying or anxiety or significant depression. Patient denies any problems with insomnia. Patient denies excessive thirst, polyuria, polydipsia. Patient denies any swollen glands, patient denies easy bruising or easy bleeding. Patient denies any recent infections, allergies or URI. Patient "s visual fields have not changed significantly in recent time.   PHYSICAL EXAM: BP (!) 142/76   Pulse 77   Temp (!) 97 F (36.1 C) (Tympanic)   Wt 222 lb (100.7 kg)   BMI 32.78 kg/m  Well-developed well-nourished patient in NAD. HEENT reveals PERLA, EOMI, discs not visualized.  Oral cavity is clear. No oral mucosal lesions are identified. Neck is clear without evidence of cervical or supraclavicular adenopathy. Lungs are clear to A&P. Cardiac examination is essentially unremarkable with regular rate and rhythm without murmur rub or thrill. Abdomen is benign with no organomegaly or masses noted. Motor sensory and DTR levels are equal and symmetric in the upper and lower extremities. Cranial nerves II through XII are grossly intact. Proprioception is intact. No peripheral adenopathy or edema is identified. No motor or  sensory levels are noted. Crude visual fields are within normal range.  LABORATORY DATA: Pathology report reviewed    RADIOLOGY RESULTS: Volume study performed under ultrasound guidance   IMPRESSION: Stage IIc adenocarcinoma the prostate Gleason 7 (69+34) in 70 year old male for I-125 interstitial implant  PLAN: This time patient is cleared to proceed seed with I-125 interstitial implant.  Volume study was performed with excellent imaging obtained.  Risks and benefits of treatment including increased lower urinary tract symptoms diarrhea fatigue as well as radiation safety precautions and risks of general anesthesia all reviewed with the patient and his wife.  They both seem to comprehend my recommendations well.  I have also contacted urologist's office to have the patient go ahead with a 68-month Eligard depot injection.  I would like to take this opportunity to thank you for allowing me to participate in the care of your patient.[name], MD

## 2021-03-16 NOTE — Telephone Encounter (Signed)
Can you assist me with this today if you have time? I have laid the benefits summary on your des, it appears that no portion of his plan will cover Eligard. Thanks

## 2021-03-16 NOTE — H&P (Signed)
H&P  Name: Dylan Pittman  MRN: [id number]  Date:   03/16/2021     DOB: 07-13-1951   This 70 y.o. 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 REFERRING PHYSICIAN: Sofie Hartigan, MD  CHIEF COMPLAINT:  Chief Complaint  Patient presents with   Follow-up    DIAGNOSIS: The encounter diagnosis was Prostate cancer [facility], [place]).   PREVIOUS INVESTIGATIONS:  Volume study performed Clinical notes reviewed Pathology reviewed  HPI: Patient is a 70 year old male who had presented with scrotal pain.  He also had prior 3 years elevation of his PSA to 9.7 which most recently is climbed to 62.  MRI scan of his prostate showed lesion in the transitional zone concerning for high-grade carcinoma no evidence of extracapsular extension lymphadenopathy was noted.  Seminal vesicles were normal.  He was status post transrectal ultrasound for a Gleason 7 (4+3).  He opted for I-125 interstitial implant.  He is now seen for volume study in anticipation of implant.  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    PAST SURGICAL HISTORY:  Past Surgical History:  Procedure Laterality Date   BACK SURGERY  1992, 2005   CLOSED REDUCTION FINGER WITH PERCUTANEOUS PINNING Left 02/12/2020   Procedure: Closed reduction and pinning of left first metacarpal fracture with possible open reduction;  Surgeon: Hessie Knows, MD;   Location: [facility] ORS;  Service: Orthopedics;  Laterality: Left;   COLONOSCOPY WITH PROPOFOL N/A 12/28/2017   Procedure: COLONOSCOPY WITH PROPOFOL;  Surgeon: Manya Silvas, MD;  Location: [facility] ENDOSCOPY;  Service: Endoscopy;  Laterality: N/A;   NASAL SEPTUM SURGERY     VASECTOMY  2005    FAMILY HISTORY: family history is not on file.  SOCIAL HISTORY:  reports that he has quit smoking. He has never used smokeless tobacco. He reports previous alcohol use. He reports previous drug use.  ALLERGIES: Pravastatin  MEDICATIONS:  Current Outpatient Medications  Medication Sig Dispense Refill   albuterol (VENTOLIN HFA) 108 (90 Base) MCG/ACT inhaler Inhale 2 puffs into the lungs every 6 (six) hours as needed for wheezing or shortness of breath.     aspirin EC 81 MG tablet Take 81 mg by mouth in the morning. Swallow whole.     cholecalciferol (VITAMIN D) 25 MCG (1000 UNIT) tablet Take 1,000 Units by mouth in the morning.     losartan (COZAAR) 50 MG tablet Take 50 mg by mouth in the morning.  2   OZEMPIC, 1 MG/DOSE, 4 MG/3ML SOPN Inject 1 mg into the skin every Sunday.     pantoprazole (PROTONIX) 40 MG tablet Take 40 mg by mouth in the morning.  5   pioglitazone (ACTOS) 30 MG tablet Take 30 mg by mouth in the morning.     rosuvastatin (CRESTOR) 5 MG tablet Take 5 mg by mouth every Monday, Wednesday, and Friday. In the morning  3   tamsulosin (FLOMAX) 0.4 MG CAPS capsule TAKE 1 CAPSULE(0.4 MG) BY MOUTH DAILY (Patient taking differently: Take 0.4 mg by mouth in the morning.) 30 capsule 0   TOUJEO  MAX SOLOSTAR 300 UNIT/ML Solostar Pen Inject 40 Units into the skin in the morning.     XIGDUO XR 12-998 MG TB24 Take 2 tablets by mouth every morning.     No current facility-administered medications for this encounter.    ECOG PERFORMANCE STATUS:  0 - Asymptomatic  REVIEW OF SYSTEMS: Patient denies any weight loss, fatigue, weakness, fever, chills or night sweats. Patient denies any loss of vision,  blurred vision. Patient denies any ringing  of the ears or hearing loss. No irregular heartbeat. Patient denies heart murmur or history of fainting. Patient denies any chest pain or pain radiating to her upper extremities. Patient denies any shortness of breath, difficulty breathing at night, cough or hemoptysis. Patient denies any swelling in the lower legs. Patient denies any nausea vomiting, vomiting of blood, or coffee ground material in the vomitus. Patient denies any stomach pain. Patient states has had normal bowel movements no significant constipation or diarrhea. Patient denies any dysuria, hematuria or significant nocturia. Patient denies any problems walking, swelling in the joints or loss of balance. Patient denies any skin changes, loss of hair or loss of weight. Patient denies any excessive worrying or anxiety or significant depression. Patient denies any problems with insomnia. Patient denies excessive thirst, polyuria, polydipsia. Patient denies any swollen glands, patient denies easy bruising or easy bleeding. Patient denies any recent infections, allergies or URI. Patient "s visual fields have not changed significantly in recent time.   PHYSICAL EXAM: BP (!) 142/76   Pulse 77   Temp (!) 97 F (36.1 C) (Tympanic)   Wt 222 lb (100.7 kg)   BMI 32.78 kg/m  Well-developed well-nourished patient in NAD. HEENT reveals PERLA, EOMI, discs not visualized.  Oral cavity is clear. No oral mucosal lesions are identified. Neck is clear without evidence of cervical or supraclavicular adenopathy. Lungs are clear to A&P. Cardiac examination is essentially unremarkable with regular rate and rhythm without murmur rub or thrill. Abdomen is benign with no organomegaly or masses noted. Motor sensory and DTR levels are equal and symmetric in the upper and lower extremities. Cranial nerves II through XII are grossly intact. Proprioception is intact. No peripheral adenopathy or edema is identified. No motor or  sensory levels are noted. Crude visual fields are within normal range.  LABORATORY DATA: Pathology report reviewed    RADIOLOGY RESULTS: Volume study performed under ultrasound guidance   IMPRESSION: Stage IIc adenocarcinoma the prostate Gleason 7 (37+4) in 70 year old male for I-125 interstitial implant  PLAN: This time patient is cleared to proceed seed with I-125 interstitial implant.  Volume study was performed with excellent imaging obtained.  Risks and benefits of treatment including increased lower urinary tract symptoms diarrhea fatigue as well as radiation safety precautions and risks of general anesthesia all reviewed with the patient and his wife.  They both seem to comprehend my recommendations well.  I have also contacted urologist's office to have the patient go ahead with a 82-month Eligard depot injection.  I would like to take this opportunity to thank you for allowing me to participate in the care of your patient.[name], MD

## 2021-03-16 NOTE — Telephone Encounter (Signed)
Incoming call on triage line from Dr. Olena Leatherwood office in regards to patient needing to be set up for Eligard.  Patient is ready for Eligard injection. Please call to schedule.

## 2021-03-17 NOTE — Telephone Encounter (Signed)
Called [employer] provider services line [phone] spoke w/Kim T. Confirmed no PA is required for CPT code [id number] and Jcode O4784. I was transferred to Benefits and Eligibility line and spoke w/ Gabriel Earing K. She confirmed that patient does have an active plan and CPT [id number] & [phone] were covered services under patient's plan, no PA required Ref #[id number]  Called patient to schedule injection apt on PA schedule. No answer, left mess to return call

## 2021-03-21 DIAGNOSIS — C61 Malignant neoplasm of prostate: Secondary | ICD-10-CM | POA: Diagnosis not present

## 2021-03-22 NOTE — Telephone Encounter (Signed)
Addressed:Patient returned call and made apt w/PA for first injection

## 2021-03-25 ENCOUNTER — Ambulatory Visit: Payer: BC Managed Care – PPO | Admitting: Physician Assistant

## 2021-03-25 ENCOUNTER — Other Ambulatory Visit: Payer: Self-pay

## 2021-03-25 DIAGNOSIS — N401 Enlarged prostate with lower urinary tract symptoms: Secondary | ICD-10-CM

## 2021-03-25 DIAGNOSIS — C61 Malignant neoplasm of prostate: Secondary | ICD-10-CM

## 2021-03-25 MED ORDER — LEUPROLIDE ACETATE (6 MONTH) 45 MG ~~LOC~~ KIT
45.0000 mg | PACK | Freq: Once | SUBCUTANEOUS | Status: AC
  Administered 2021-03-25: 45 mg via SUBCUTANEOUS

## 2021-03-25 MED ORDER — TAMSULOSIN HCL 0.4 MG PO CAPS: 0.4000 mg | ORAL_CAPSULE | Freq: Every day | ORAL | 11 refills | Status: AC

## 2021-03-25 NOTE — Progress Notes (Signed)
Patient presented to clinic today for initiation of 6 months of ADT with Eligard per Drs. Stoioff and Chrystal.   Patient reports walking for exercise.  He has a history of diabetes, hypertension, and hyperlipidemia. Today he also reports nocturia x5 and states he has been unable to refill his Flomax recently.  We discussed the anticipated side effects of ADT today including hot flashes, decreased libido, erectile dysfunction, breast tenderness or size changes, fatigue, weight gain, bone loss, muscle mass loss, brain fog, increased cholesterol, increased blood pressure, increased blood sugar, and increased risk for stroke and heart attack.  I counseled him to maintain a healthy diet and continue regular exercise including weightbearing exercise to mitigate bone and muscle loss for the duration of therapy.  Additionally, I counseled him to start daily calcium 1000-'1200mg'$  and vitamin D 800-1000IU supplements to reduce bone loss.  Lastly, I encouraged him to maintain regular follow-ups with his PCP for monitoring of his metabolic syndrome. He expressed understanding, all questions answered. Printed resources on ADT provided today. Flomax also refilled.  Debroah Loop, PA-C 03/25/21 1:46 PM  I spent 20 minutes on the day of the encounter to include pre-visit record review, face-to-face time with the patient, and post-visit ordering of tests.

## 2021-03-25 NOTE — Progress Notes (Signed)
Eligard SubQ Injection   Due to Prostate Cancer patient is present today for a Eligard Injection.  Medication: Eligard 6 month Dose: 45 mg  Location: right ventrogluteal Lot: [id number] Exp: 08/2022  Patient tolerated well, no complications were noted  Performed by: Bradly Bienenstock, CMA  Per Dr. Bernardo Heater patient is to continue therapy for 6 months . Patient given reminder continue on Vitamin D 800-1000iu and Calium 1000-'1200mg'$  daily while on Androgen Deprivation Therapy.  PA approval dates:  No PA required

## 2021-03-25 NOTE — Patient Instructions (Signed)
Please take the following dietary supplements for the duration of your hormone suppression therapy to reduce your risk for bone loss: -Calcium 1000-1200mg daily -Vitamin D 800-1000IU daily  

## 2021-04-01 ENCOUNTER — Other Ambulatory Visit
Admission: RE | Admit: 2021-04-01 | Discharge: 2021-04-01 | Disposition: A | Discharge status: Home / Self Care | Payer: BC Managed Care – PPO | Source: Ambulatory Visit | Attending: Urology | Admitting: Urology

## 2021-04-01 ENCOUNTER — Other Ambulatory Visit: Payer: Self-pay

## 2021-04-01 DIAGNOSIS — C61 Malignant neoplasm of prostate: Secondary | ICD-10-CM

## 2021-04-01 NOTE — Patient Instructions (Addendum)
Your procedure is scheduled on: Tuesday April 12, 2021. Report to Day Surgery inside [facility] 2nd floor stop by admissions desk first before getting on elevator. To find out your arrival time please call [phone] between 1PM - 3PM on Monday April 11, 2021.  Remember: Instructions that are not followed completely may result in serious medical risk,  up to and including death, or upon the discretion of your surgeon and anesthesiologist your  surgery may need to be rescheduled.     _X__ 1. Do not eat food or drink fluids after midnight the night before your procedure.                 No chewing gum or hard candies.   __X__2.  On the morning of surgery brush your teeth with toothpaste and water, you                may rinse your mouth with mouthwash if you wish.  Do not swallow any toothpaste of mouthwash.     _X__ 3.  No Alcohol for 24 hours before or after surgery.   _X__ 4.  Do Not Smoke or use e-cigarettes For 24 Hours Prior to Your Surgery.                 Do not use any chewable tobacco products for at least 6 hours prior to                 Surgery.  _X__  5.  Do not use any recreational drugs (marijuana, cocaine, heroin, ecstasy, MDMA or other)                For at least one week prior to your surgery.  Combination of these drugs with anesthesia                May have life threatening results.  __X__ 6.  Notify your doctor if there is any change in your medical condition      (cold, fever, infections).     Do not wear jewelry, make-up, hairpins, clips or nail polish. Do not wear lotions, powders, or perfumes.  Do not shave 48 hours prior to surgery. Men may shave face and neck. Do not bring valuables to the hospital.    [facility] is not responsible for any belongings or valuables.  Contacts, dentures or bridgework may not be worn into surgery. Leave your suitcase in the car. After surgery it may be brought to your room. For patients  admitted to the hospital, discharge time is determined by your treatment team.   Patients discharged the day of surgery will not be allowed to drive home.   Make arrangements for someone to be with you for the first 24 hours of your Same Day Discharge.   __X__ Take these medicines the morning of surgery with A SIP OF WATER:    1. pantoprazole (PROTONIX) 40 MG  2.   3.   4.  5.  6.  __X__ Fleet Enema (as directed)   ____ Use CHG Soap (or wipes) as directed  ____ Use Benzoyl Peroxide Gel as instructed  __X__ Use inhalers on the day of surgery (bring in to hospital)  albuterol (VENTOLIN HFA) 108 (90 Base) MCG/ACT inhaler  __X__ Stop XIGDUO XR 12-998 MG 2 days prior to surgery (last dose will be 04/09/21)    __X__ Take 1/2 of usual insulin dose the night before surgery. No insulin the morning  of surgery. TOUJEO MAX SOLOSTAR 300 UNIT/ML Solostar Pen  __X__ one week prior to surgery stop aspirin 81 mg.   __X__ One Week prior to surgery- Stop Anti-inflammatories such as Ibuprofen, Aleve, Advil, Motrin, meloxicam (MOBIC), diclofenac, etodolac, ketorolac, Toradol, Daypro, piroxicam, Goody's or BC powders. OK TO USE TYLENOL IF NEEDED   __X__ Stop supplements until after surgery.    __X__ Bring C-Pap to the hospital.    If you have any questions regarding your pre-procedure instructions,  Please call Pre-admit Testing at [phone].

## 2021-04-05 ENCOUNTER — Other Ambulatory Visit
Admission: RE | Admit: 2021-04-05 | Discharge: 2021-04-05 | Disposition: A | Discharge status: Home / Self Care | Payer: BC Managed Care – PPO | Source: Ambulatory Visit | Attending: Urology | Admitting: Urology

## 2021-04-05 ENCOUNTER — Other Ambulatory Visit: Payer: Self-pay

## 2021-04-05 DIAGNOSIS — C61 Malignant neoplasm of prostate: Secondary | ICD-10-CM | POA: Diagnosis not present

## 2021-04-05 DIAGNOSIS — Z01810 Encounter for preprocedural cardiovascular examination: Secondary | ICD-10-CM

## 2021-04-05 DIAGNOSIS — Z01818 Encounter for other preprocedural examination: Secondary | ICD-10-CM | POA: Insufficient documentation

## 2021-04-05 DIAGNOSIS — I10 Essential (primary) hypertension: Secondary | ICD-10-CM | POA: Insufficient documentation

## 2021-04-05 LAB — BASIC METABOLIC PANEL
Anion gap: 10 (ref 5–15)
BUN: 16 mg/dL (ref 8–23)
CO2: 26 mmol/L (ref 22–32)
Calcium: 9 mg/dL (ref 8.9–10.3)
Chloride: 103 mmol/L (ref 98–111)
Creatinine, Ser: 0.79 mg/dL (ref 0.61–1.24)
GFR, Estimated: >60 mL/min (ref >60)
Glucose, Bld: 237 mg/dL — ABNORMAL HIGH (ref 70–99)
Potassium: 4 mmol/L (ref 3.5–5.1)
Sodium: 139 mmol/L (ref 135–145)

## 2021-04-05 LAB — CBC
HCT: 42.9 % (ref 39.0–52.0)
Hemoglobin: 15 g/dL (ref 13.0–17.0)
MCH: 32.2 pg (ref 26.0–34.0)
MCHC: 35 g/dL (ref 30.0–36.0)
MCV: 92.1 fL (ref 80.0–100.0)
Platelets: 251 10*3/uL (ref 150–400)
RBC: 4.66 MIL/uL (ref 4.22–5.81)
RDW: 12.7 % (ref 11.5–15.5)
WBC: 6.5 10*3/uL (ref 4.0–10.5)
nRBC: 0 % (ref 0.0–0.2)

## 2021-04-05 NOTE — Progress Notes (Signed)
  Perioperative Services Pre-Admission/Anesthesia Testing   Date: 04/05/21 Name: Dylan Pittman MRN:   [id number]  Re: Consideration of preoperative prophylactic antibiotic change   Request sent to: Abbie Sons, MD (routed and/or faxed via [facility])  Planned Surgical Procedure(s):    Case: [id number] Date/Time: 04/12/21 [id number]   Procedure: RADIOACTIVE SEED IMPLANT/BRACHYTHERAPY IMPLANT   Anesthesia type: Choice   Pre-op diagnosis: Prostate cancer   Location: [facility] OR ROOM 10 / [place] ORS FOR ANESTHESIA GROUP   Surgeons: Abbie Sons, MD     Notes: Patient has no documented allergy to PCN   Request:  As an evidence based approach to reducing the rate of incidence for post-operative SSI and the development of MDROs, could an agent with narrower coverage for preoperative prophylaxis in this patient's upcoming surgical course be considered?   Currently ordered preoperative prophylactic ABX: ciprofloxacin.   Specifically requesting change to cephalosporin (CEFAZOLIN).   Please communicate decision with me and I will change the orders in Epic as per your direction.   Citation: Beckie Salts, Wardell Heath et al: Best practice statement on urologic procedures and antimicrobial prophylaxis. J Urol 2020; 203: 351.   Honor Loh, MSN, APRN, FNP-C, CEN [facility]  Peri-operative Services Nurse Practitioner FAX: [phone] 04/05/21 8:35 AM

## 2021-04-11 MED ORDER — CIPROFLOXACIN IN D5W 400 MG/200ML IV SOLN
400.0000 mg | INTRAVENOUS | Status: AC
  Administered 2021-04-12: 400 mg via INTRAVENOUS

## 2021-04-12 ENCOUNTER — Encounter: Payer: Self-pay | Admitting: Urology

## 2021-04-12 ENCOUNTER — Ambulatory Visit: Payer: BC Managed Care – PPO

## 2021-04-12 ENCOUNTER — Other Ambulatory Visit: Payer: Self-pay

## 2021-04-12 ENCOUNTER — Ambulatory Visit: Payer: BC Managed Care – PPO | Admitting: Urgent Care

## 2021-04-12 ENCOUNTER — Telehealth: Payer: Self-pay | Admitting: Urology

## 2021-04-12 ENCOUNTER — Encounter
Admission: RE | Disposition: A | Discharge status: Home / Self Care | Payer: Self-pay | Source: Home / Self Care | Attending: Urology

## 2021-04-12 ENCOUNTER — Ambulatory Visit
Admission: RE | Admit: 2021-04-12 | Discharge: 2021-04-12 | Disposition: A | Discharge status: Home / Self Care | Payer: BC Managed Care – PPO | Attending: Urology | Admitting: Urology

## 2021-04-12 DIAGNOSIS — C61 Malignant neoplasm of prostate: Secondary | ICD-10-CM | POA: Insufficient documentation

## 2021-04-12 DIAGNOSIS — Z87891 Personal history of nicotine dependence: Secondary | ICD-10-CM | POA: Insufficient documentation

## 2021-04-12 DIAGNOSIS — Z79899 Other long term (current) drug therapy: Secondary | ICD-10-CM | POA: Diagnosis not present

## 2021-04-12 DIAGNOSIS — Z7982 Long term (current) use of aspirin: Secondary | ICD-10-CM | POA: Insufficient documentation

## 2021-04-12 HISTORY — PX: RADIOACTIVE SEED IMPLANT: SHX5150

## 2021-04-12 LAB — GLUCOSE, CAPILLARY
Glucose-Capillary: 155 mg/dL — ABNORMAL HIGH (ref 70–99)
Glucose-Capillary: 215 mg/dL — ABNORMAL HIGH (ref 70–99)

## 2021-04-12 SURGERY — INSERTION, RADIATION SOURCE, PROSTATE
Anesthesia: General

## 2021-04-12 MED ORDER — FENTANYL CITRATE (PF) 100 MCG/2ML IJ SOLN: 25.0000 ug | INTRAMUSCULAR | Status: DC | PRN

## 2021-04-12 MED ORDER — CHLORHEXIDINE GLUCONATE 0.12 % MT SOLN
OROMUCOSAL | Status: AC
  Administered 2021-04-12: 15 mL via OROMUCOSAL
  Filled 2021-04-12: qty 15

## 2021-04-12 MED ORDER — CIPROFLOXACIN IN D5W 400 MG/200ML IV SOLN
INTRAVENOUS | Status: AC
  Filled 2021-04-12: qty 200

## 2021-04-12 MED ORDER — MEPERIDINE HCL 25 MG/ML IJ SOLN: 6.2500 mg | INTRAMUSCULAR | Status: DC | PRN

## 2021-04-12 MED ORDER — ORAL CARE MOUTH RINSE: 15.0000 mL | Freq: Once | OROMUCOSAL | Status: AC

## 2021-04-12 MED ORDER — PHENYLEPHRINE HCL (PRESSORS) 10 MG/ML IV SOLN
INTRAVENOUS | Status: DC | PRN
  Administered 2021-04-12 (×2): 100 ug via INTRAVENOUS

## 2021-04-12 MED ORDER — HYDROCODONE-ACETAMINOPHEN 5-325 MG PO TABS: 1.0000 | ORAL_TABLET | Freq: Four times a day (QID) | ORAL | 0 refills | Status: DC | PRN

## 2021-04-12 MED ORDER — ONDANSETRON HCL 4 MG/2ML IJ SOLN
INTRAMUSCULAR | Status: DC | PRN
  Administered 2021-04-12: 4 mg via INTRAVENOUS

## 2021-04-12 MED ORDER — ONDANSETRON HCL 4 MG/2ML IJ SOLN: 4.0000 mg | Freq: Once | INTRAMUSCULAR | Status: DC | PRN

## 2021-04-12 MED ORDER — KETOROLAC TROMETHAMINE 30 MG/ML IJ SOLN
INTRAMUSCULAR | Status: DC | PRN
  Administered 2021-04-12: 15 mg via INTRAVENOUS

## 2021-04-12 MED ORDER — BACITRACIN 500 UNIT/GM EX OINT
TOPICAL_OINTMENT | CUTANEOUS | Status: DC | PRN
  Administered 2021-04-12: 1 via TOPICAL

## 2021-04-12 MED ORDER — ROCURONIUM BROMIDE 100 MG/10ML IV SOLN
INTRAVENOUS | Status: DC | PRN
Start: 2021-04-12 — End: 2021-04-12
  Administered 2021-04-12: 50 mg via INTRAVENOUS

## 2021-04-12 MED ORDER — PROPOFOL 10 MG/ML IV BOLUS
INTRAVENOUS | Status: DC | PRN
  Administered 2021-04-12: 200 mg via INTRAVENOUS

## 2021-04-12 MED ORDER — FENTANYL CITRATE (PF) 100 MCG/2ML IJ SOLN
INTRAMUSCULAR | Status: AC
  Filled 2021-04-12: qty 2

## 2021-04-12 MED ORDER — CHLORHEXIDINE GLUCONATE 0.12 % MT SOLN: 15.0000 mL | Freq: Once | OROMUCOSAL | Status: AC

## 2021-04-12 MED ORDER — SODIUM CHLORIDE 0.9 % IV SOLN: INTRAVENOUS | Status: DC

## 2021-04-12 MED ORDER — 0.9 % SODIUM CHLORIDE (POUR BTL) OPTIME
TOPICAL | Status: DC | PRN
  Administered 2021-04-12: 200 mL

## 2021-04-12 MED ORDER — FLEET ENEMA 7-19 GM/118ML RE ENEM: 1.0000 | ENEMA | Freq: Once | RECTAL | Status: DC

## 2021-04-12 MED ORDER — BACITRACIN ZINC 500 UNIT/GM EX OINT
TOPICAL_OINTMENT | CUTANEOUS | Status: AC
  Filled 2021-04-12: qty 28.35

## 2021-04-12 MED ORDER — FENTANYL CITRATE (PF) 100 MCG/2ML IJ SOLN
INTRAMUSCULAR | Status: DC | PRN
  Administered 2021-04-12: 50 ug via INTRAVENOUS
  Administered 2021-04-12 (×2): 25 ug via INTRAVENOUS

## 2021-04-12 MED ORDER — DEXAMETHASONE SODIUM PHOSPHATE 10 MG/ML IJ SOLN
INTRAMUSCULAR | Status: DC | PRN
  Administered 2021-04-12: 5 mg via INTRAVENOUS

## 2021-04-12 MED ORDER — LIDOCAINE HCL (CARDIAC) PF 100 MG/5ML IV SOSY
PREFILLED_SYRINGE | INTRAVENOUS | Status: DC | PRN
  Administered 2021-04-12: 100 mg via INTRAVENOUS

## 2021-04-12 MED ORDER — SUGAMMADEX SODIUM 200 MG/2ML IV SOLN
INTRAVENOUS | Status: DC | PRN
  Administered 2021-04-12: 200 mg via INTRAVENOUS

## 2021-04-12 SURGICAL SUPPLY — 23 items
BAG DRN RND TRDRP ANRFLXCHMBR (UROLOGICAL SUPPLIES) ×1
BAG URINE DRAIN 2000ML AR STRL (UROLOGICAL SUPPLIES) ×2 IMPLANT
BLADE CLIPPER SURG (BLADE) ×2 IMPLANT
CATH FOL 2WAY LX 16X5 (CATHETERS) ×2 IMPLANT
COVER BACK TABLE REUSABLE LG (DRAPES) ×2 IMPLANT
DRAPE INCISE 23X17 IOBAN STRL (DRAPES) ×1
DRAPE INCISE IOBAN 23X17 STRL (DRAPES) ×1 IMPLANT
DRAPE UNDER BUTTOCK W/FLU (DRAPES) ×2 IMPLANT
DRSG TELFA 3X8 NADH (GAUZE/BANDAGES/DRESSINGS) ×2 IMPLANT
GAUZE 4X4 16PLY ~~LOC~~+RFID DBL (SPONGE) ×4 IMPLANT
GLOVE SURG ENC MOIS LTX SZ7.5 (GLOVE) ×4 IMPLANT
GLOVE SURG UNDER POLY LF SZ7.5 (GLOVE) ×2 IMPLANT
GOWN STRL REUS W/ TWL LRG LVL3 (GOWN DISPOSABLE) ×2 IMPLANT
GOWN STRL REUS W/ TWL XL LVL3 (GOWN DISPOSABLE) ×2 IMPLANT
GOWN STRL REUS W/TWL LRG LVL3 (GOWN DISPOSABLE) ×4
GOWN STRL REUS W/TWL XL LVL3 (GOWN DISPOSABLE) ×4
KIT TURNOVER CYSTO (KITS) ×2 IMPLANT
MANIFOLD NEPTUNE II (INSTRUMENTS) ×2 IMPLANT
PACK CYSTO AR (MISCELLANEOUS) ×2 IMPLANT
SET CYSTO W/LG BORE CLAMP LF (SET/KITS/TRAYS/PACK) ×2 IMPLANT
SURGILUBE 2OZ TUBE FLIPTOP (MISCELLANEOUS) ×2 IMPLANT
SYR 10ML LL (SYRINGE) ×2 IMPLANT
WATER STERILE IRR 1000ML POUR (IV SOLUTION) ×2 IMPLANT

## 2021-04-12 NOTE — Anesthesia Procedure Notes (Signed)
Procedure Name: Intubation Date/Time: 04/12/2021 8:43 AM Performed by: Lowry Bowl, CRNA Pre-anesthesia Checklist: Patient identified, Emergency Drugs available, Suction available and Patient being monitored Patient Re-evaluated:Patient Re-evaluated prior to induction Oxygen Delivery Method: Circle system utilized Preoxygenation: Pre-oxygenation with 100% oxygen Induction Type: IV induction and Cricoid Pressure applied Ventilation: Mask ventilation without difficulty Laryngoscope Size: McGraph and 4 Grade View: Grade II Tube type: Oral Tube size: 7.5 mm Number of attempts: 1 Airway Equipment and Method: Stylet and Video-laryngoscopy Placement Confirmation: ETT inserted through vocal cords under direct vision, positive ETCO2 and breath sounds checked- equal and bilateral Secured at: 22 cm Tube secured with: Tape Dental Injury: Teeth and Oropharynx as per pre-operative assessment

## 2021-04-12 NOTE — Transfer of Care (Signed)
Immediate Anesthesia Transfer of Care Note  Patient: Dylan Pittman  Procedure(s) Performed: RADIOACTIVE SEED IMPLANT/BRACHYTHERAPY IMPLANT  Patient Location: PACU  Anesthesia Type:General  Level of Consciousness: awake, drowsy and patient cooperative  Airway & Oxygen Therapy: Patient Spontanous Breathing  Post-op Assessment: Report given to RN and Post -op Vital signs reviewed and stable  Post vital signs: Reviewed and stable  Last Vitals:  Vitals Value Taken Time  BP 156/75 04/12/21 0939  Temp    Pulse 88 04/12/21 0942  Resp 17 04/12/21 0942  SpO2 90 % 04/12/21 0942  Vitals shown include unvalidated device data.  Last Pain:  Vitals:   04/12/21 0734  PainSc: 0-No pain      Patients Stated Pain Goal: 0 ([id number])  Complications: No notable events documented.

## 2021-04-12 NOTE — Telephone Encounter (Signed)
Please schedule post brachytherapy follow-up 1 month with PA  APPT MADE

## 2021-04-12 NOTE — Anesthesia Postprocedure Evaluation (Signed)
Anesthesia Post Note  Patient: Dylan Pittman  Procedure(s) Performed: RADIOACTIVE SEED IMPLANT/BRACHYTHERAPY IMPLANT  Patient location during evaluation: PACU Anesthesia Type: General Level of consciousness: awake and alert, awake and oriented Pain management: pain level controlled Vital Signs Assessment: post-procedure vital signs reviewed and stable Respiratory status: spontaneous breathing, nonlabored ventilation and respiratory function stable Cardiovascular status: blood pressure returned to baseline and stable Postop Assessment: no apparent nausea or vomiting Anesthetic complications: no   No notable events documented.   Last Vitals:  Vitals:   04/12/21 1016 04/12/21 1027  BP: 104/63 137/69  Pulse: 77 78  Resp: 10 16  Temp: (!) 36.2 C (!) 36.1 C  SpO2: 97% 94%    Last Pain:  Vitals:   04/12/21 1027  PainSc: 2                  Phill Mutter

## 2021-04-12 NOTE — Progress Notes (Signed)
Radiation Oncology Follow up Note  Name: Dylan Pittman   Date:   03/01/2021 MRN:  [id number] DOB: 12/09/1950    This 70 y.o. male presents to the OR today for I-125 interstitial implant for Gleason 7 (4+3) adenocarcinoma the prostate REFERRING PROVIDER: No ref. provider found  HPI: Patient is a 70 year old male who had presented with scrotal pain.  He also had prior 3 years elevation of his PSA to 9.7 which most recently is climbed to 94.  MRI scan of his prostate showed lesion in the transitional zone concerning for high-grade carcinoma no evidence of extracapsular extension lymphadenopathy was noted.  Seminal vesicles were normal.  He was status post transrectal ultrasound for a Gleason 7 (4+3).  He opted for I-125 interstitial implant. Marland Kitchen  He was brought to the OR today for I-125 interstitial implant.  COMPLICATIONS OF TREATMENT: none  FOLLOW UP COMPLIANCE: keeps appointments   PHYSICAL EXAM:  BP 137/69   Pulse 78   Temp (!) 97 F (36.1 C)   Resp 16   Ht '5\' 8"'$  (1.727 m)   Wt 218 lb 9.6 oz (99.2 kg)   SpO2 94%   BMI 33.24 kg/m  Well-developed well-nourished patient in NAD. HEENT reveals PERLA, EOMI, discs not visualized.  Oral cavity is clear. No oral mucosal lesions are identified. Neck is clear without evidence of cervical or supraclavicular adenopathy. Lungs are clear to A&P. Cardiac examination is essentially unremarkable with regular rate and rhythm without murmur rub or thrill. Abdomen is benign with no organomegaly or masses noted. Motor sensory and DTR levels are equal and symmetric in the upper and lower extremities. Cranial nerves II through XII are grossly intact. Proprioception is intact. No peripheral adenopathy or edema is identified. No motor or sensory levels are noted. Crude visual fields are within normal range.  RADIOLOGY RESULTS: Ultrasound used for source placement  PLAN: Patient was taken to the operating room and general anesthesia was administered. Legs were  immobilized in stirrups and patient was positioned in the exact same proportions as original volume study. Patient was prepped and Foley catheter was placed. Ultrasound guidance identified the prostate and recreated the original set up as per treatment planning volume study. 24needles were placed under ultrasound guidance with PVCs delivered to the prostate volume. After completion of procedure cystoscopy was performed by urology and no evidence of seeds in the bladder were noted. Patient tolerated the procedure extremely well. Initial plain film as doublecheck identified 73 seeds in the prostate.  A strand with 2 seeds were removed as they were involved and encroaching the urethra.  Patient has followup appointment in one month for CT scan for quality assurance will be performed.    [name], MD

## 2021-04-12 NOTE — Op Note (Signed)
Preoperative diagnosis: Adenocarcinoma of the prostate   Postoperative diagnosis: Adenocarcinoma the prostate  Procedure: I-125 prostate interstitial implant, cystoscopy  Surgeon: John Giovanni, M.D.   Radiation oncologist: Lavena Stanford, M.D.   Anesthesia: General  Drains: Foley catheter  Complications: none  Indications: Indication: Dylan Pittman is a 70 y.o. patient with T1c intermediate risk prostate cancer.  He elected radiation therapy for treatment and is scheduled for I-125 brachytherapy.  After reviewing the management options for treatment, he elected to proceed with the above surgical procedure(s). We have discussed the potential benefits and risks of the procedure, side effects of the proposed treatment, the likelihood of the patient achieving the goals of the procedure, and any potential problems that might occur during the procedure or recuperation. Informed consent has been obtained.  Procedure:  The patient was taken to the operating room and general anesthesia was induced.  The patient was placed in the dorsal lithotomy position, prepped and draped in the usual sterile fashion, and preoperative antibiotics were administered. A preoperative time-out was performed.   Radiation oncology department placed a transrectal ultrasound probe anchoring stand/ grid and aligned with previous imaging from the volume study. Foley catheter was inserted without difficulty.  All needle passage was done with real-time transrectal ultrasound guidance in both the transverse and sagittal plains in order to achieve the desired preplanned position. A total of 24 needles were placed.  75 active seeds were implanted. The Foley catheter was removed and a rigid cystoscopy was performed with findings of marked bladder neck elevation.  A strand was noted in the prostatic urethra which was grasped with endoscopic forceps and removed.  The stream contained 2 seeds.   A fluoroscopic image was then obtained  showing excellent distrubution of the brachytherapy seeds.  Each seed was counted and counts were correct.    Due to bladder neck elevation it was elected to replace his Foley catheter for 24 hours.  After anesthetic reversal he was transported to the PACU in stable condition.  Plan: Office visit 04/13/2021 for catheter removal Postop follow-up 1 month   John Giovanni, MD

## 2021-04-12 NOTE — Interval H&P Note (Signed)
History and Physical Interval Note:  04/12/2021 8:23 AM  Dylan Pittman  has presented today for surgery, with the diagnosis of Prostate cancer.  The various methods of treatment have been discussed with the patient and family. After consideration of risks, benefits and other options for treatment, the patient has consented to  Procedure(s): RADIOACTIVE SEED IMPLANT/BRACHYTHERAPY IMPLANT (N/A) as a surgical intervention.  The patient's history has been reviewed, patient examined, no change in status, stable for surgery.  I have reviewed the patient's chart and labs.  Questions were answered to the patient's satisfaction.     [place]

## 2021-04-12 NOTE — Discharge Instructions (Addendum)
Discharge instructions  You have a follow-up appointment at [facility] tomorrow morning 04/13/2021 for catheter removal Resume regular medications Rx hydrocodone was sent to your pharmacy if pain medication is needed No strenuous activity for 48 hours Do not drive if taking narcotic pain medications You have a follow-up appointments with Dr. Baruch Gouty and Dr. Bernardo Heater in approximately 1 month [place]   The drugs that you were given will stay in your system until tomorrow so for the next 24 hours you should not:  Drive an automobile Make any legal decisions Drink any alcoholic beverage   You may resume regular meals tomorrow.  Today it is better to start with liquids and gradually work up to solid foods.  You may eat anything you prefer, but it is better to start with liquids, then soup and crackers, and gradually work up to solid foods.   Please notify your doctor immediately if you have any unusual bleeding, trouble breathing, redness and pain at the surgery site, drainage, fever, or pain not relieved by medication.    Additional Instructions:     Please contact your physician with any problems or Same Day Surgery at [phone], Monday through Friday 6 am to 4 pm, or [place] at [facility] number at [phone].

## 2021-04-12 NOTE — Anesthesia Preprocedure Evaluation (Addendum)
Anesthesia Evaluation  Patient identified by MRN, date of birth, ID band Patient awake    Reviewed: Allergy & Precautions, H&P , NPO status , Patient's Chart, lab work & pertinent test results, reviewed documented beta blocker date and time   History of Anesthesia Complications Negative for: history of anesthetic complications  Airway Mallampati: III  TM Distance: >3 FB Neck ROM: full    Dental no notable dental hx. (+) Dental Advisory Given, Teeth Intact, Chipped,    Pulmonary sleep apnea and Continuous Positive Airway Pressure Ventilation , COPD, former smoker,    Pulmonary exam normal breath sounds clear to auscultation       Cardiovascular hypertension, On Medications Normal cardiovascular exam Rhythm:regular Rate:Normal - Systolic murmurs    Neuro/Psych  Neuromuscular disease negative psych ROS   GI/Hepatic Neg liver ROS, GERD  Medicated and Controlled,  Endo/Other  diabetes, Well Controlled, Insulin Dependent  Renal/GU Renal disease  negative genitourinary   Musculoskeletal  (+) Arthritis , Osteoarthritis,    Abdominal   Peds  Hematology negative hematology ROS (+)   Anesthesia Other Findings Past Medical History: No date: Chronic airway obstruction (HCC) No date: DDD (degenerative disc disease), lumbar No date: Dupuytren's disease 04/07/2014: Dyslipidemia 04/20/2014: Esophageal reflux 04/20/2014: Hypertension 04/07/2014: Microalbuminuria No date: Microalbuminuria 04/07/2014: Obesity, unspecified 04/20/2014: Sleep apnea 01/08/2014: 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  ([place]) Past Surgical History: 1992, 2005: BACK SURGERY No date: NASAL SEPTUM SURGERY 2005: VASECTOMY BMI    Body Mass Index:  32.08 kg/m     Reproductive/Obstetrics negative OB ROS                           Anesthesia Physical  Anesthesia Plan  ASA: 3  Anesthesia Plan: General   Post-op  Pain Management:    Induction: Intravenous  PONV Risk Score and Plan: 3 and Ondansetron and Midazolam  Airway Management Planned: Oral ETT  Additional Equipment: None  Intra-op Plan:   Post-operative Plan: Extubation in OR  Informed Consent: I have reviewed the patients History and Physical, chart, labs and discussed the procedure including the risks, benefits and alternatives for the proposed anesthesia with the patient or authorized representative who has indicated his/her understanding and acceptance.     Dental Advisory Given  Plan Discussed with: CRNA, Anesthesiologist and Surgeon  Anesthesia Plan Comments: (Discussed risks of anesthesia with patient, including PONV, sore throat, lip/dental damage. Rare risks discussed as well, such as cardiorespiratory and neurological sequelae. Patient understands.)       Anesthesia Quick Evaluation

## 2021-04-12 NOTE — Telephone Encounter (Signed)
Please have patient come in tomorrow morning for cath removal.  Does not need a voiding trial.   Appt made and patient is aware

## 2021-04-13 ENCOUNTER — Ambulatory Visit (INDEPENDENT_AMBULATORY_CARE_PROVIDER_SITE_OTHER): Payer: BC Managed Care – PPO | Admitting: Urology

## 2021-04-13 DIAGNOSIS — R339 Retention of urine, unspecified: Secondary | ICD-10-CM

## 2021-04-13 NOTE — Progress Notes (Signed)
Catheter Removal  Patient is present today for a catheter removal.  83m of water was drained from the balloon. A 16FR foley cath was removed from the bladder no complications were noted . Patient tolerated well.  Performed by: CElberta Leatherwood CMA  Follow up/ Additional notes: 1 month

## 2021-04-14 ENCOUNTER — Encounter: Payer: Self-pay | Admitting: Urology

## 2021-04-14 ENCOUNTER — Telehealth: Payer: Self-pay

## 2021-04-14 ENCOUNTER — Other Ambulatory Visit: Payer: Self-pay

## 2021-04-14 ENCOUNTER — Ambulatory Visit (INDEPENDENT_AMBULATORY_CARE_PROVIDER_SITE_OTHER): Payer: BC Managed Care – PPO | Admitting: Urology

## 2021-04-14 VITALS — BP 155/83 | HR 91 | Ht 68.0 in | Wt 218.0 lb

## 2021-04-14 DIAGNOSIS — R339 Retention of urine, unspecified: Secondary | ICD-10-CM | POA: Diagnosis not present

## 2021-04-14 LAB — BLADDER SCAN AMB NON-IMAGING: Scan Result: 367

## 2021-04-14 NOTE — Telephone Encounter (Signed)
Incoming call from pt on triage line who states that since removing his catheter yesterday he has had difficulty voiding. He has been up every hour with the urge to void. Urinary stream is weak. Also c/o dysuria. Pt added to providers schedule and advised to come into office immediately for PVR. Pt voiced understanding.

## 2021-04-14 NOTE — Progress Notes (Signed)
Simple Catheter Placement  Due to urinary retention patient is present today for a foley cath placement.  Patient was cleaned and prepped in a sterile fashion with betadine. A 16 coude foley catheter was inserted, urine return was noted  43m, urine was yellow in color.  The balloon was filled with 10cc of sterile water.  A leg bag was attached for drainage. Patient was also given a night bag to take home and was given instruction on how to change from one bag to another.  Patient was given instruction on proper catheter care.  Patient tolerated well, no complications were noted   Performed by: CElberta LeatherwoodCMA  Additional notes/ Follow up: 1 week

## 2021-04-14 NOTE — Progress Notes (Signed)
Prostate brachytherapy 8/16 with marked bladder neck elevation on post implant cystoscopy.  Catheter was placed overnight however has had recurrent retention.  We will leave catheter indwelling until next week with repeat voiding trial

## 2021-04-20 NOTE — Progress Notes (Signed)
04/21/2021 10:19 PM   Dylan Pittman 1951/06/15 [id number]  Referring provider: Sofie Hartigan, MD [place],  [place] [address]  Urological history: 1. Prostate cancer -T1c adenocarcinoma prostate (NCCN intermediate risk-unfavorable) -PSA Trend Component     Latest Ref Rng & Units 11/19/2017 06/07/2018 12/12/2018 06/13/2019  Prostate Specific Ag, Serum     0.0 - 4.0 ng/mL 9.7 (H) 9.0 (H) 10.2 (H) 9.6 (H)   Component     Latest Ref Rng & Units 07/29/2020 09/08/2020  Prostate Specific Ag, Serum     0.0 - 4.0 ng/mL 13.8 (H) 12.0 (H)  He underwent cognitive biopsy for PI-RADS 5 lesion 11/05/2020.  No postbiopsy complaints.   PSA 13.8, 45 g gland on TRUS; 16 cores obtained   Pathology: 3 cores with Gleason 4+3 adenocarcinoma and one core with Gleason 3+4 adenocarcinoma as below.    -s/p I-125 prostate interstitial implant, cystoscopy   2. Urinary retention -marked bladder neck elevation on cysto 03/2021 -post-op seed implant    Chief Complaint  Patient presents with   Other    HPI: KERRION CARAKER is a 70 y.o. male who presents for a TOV.  Catheter Removal  Patient is present today for a catheter removal.  9 ml of water was drained from the balloon. A 16 FR Coude foley cath was removed from the bladder no complications were noted . Patient tolerated well.  Performed by: Myself   He returns this afternoon with a PVR of 394 mL.  He states he has been urinating about every 30 minutes while at home.  His UA notes 6-10 WBCs 11-30 RBCs.  Patient denies any modifying or aggravating factors.  Patient denies any gross hematuria, dysuria or suprapubic/flank pain.  Patient denies any fevers, chills, nausea or vomiting.     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 ([place]) 01/08/2014    Surgical History: Past Surgical History:  Procedure Laterality Date   [place], 2005   CLOSED REDUCTION FINGER WITH PERCUTANEOUS PINNING Left 02/12/2020   Procedure: Closed reduction and pinning of left first metacarpal fracture with possible open reduction;  Surgeon: Hessie Knows, MD;  Location: [facility] ORS;  Service: Orthopedics;  Laterality: Left;   COLONOSCOPY WITH PROPOFOL N/A 12/28/2017   Procedure: COLONOSCOPY WITH PROPOFOL;  Surgeon: Manya Silvas, MD;  Location: [facility] ENDOSCOPY;  Service: Endoscopy;  Laterality: N/A;   NASAL SEPTUM SURGERY     RADIOACTIVE SEED IMPLANT N/A 04/12/2021   Procedure: RADIOACTIVE SEED IMPLANT/BRACHYTHERAPY IMPLANT;  Surgeon: Abbie Sons, MD;  Location: [facility] ORS;  Service: Urology;  Laterality: N/A;  73 seeds implanted   VASECTOMY  2005    Home Medications:  Allergies as of 04/21/2021   No Known Allergies      Medication List        Accurate as of April 21, 2021 11:59 PM. If you have any questions, ask your nurse or doctor.          albuterol 108 (90 Base) MCG/ACT inhaler Commonly known as: VENTOLIN HFA Inhale 2 puffs into the lungs every 6 (six) hours as needed for wheezing  or shortness of breath.   aspirin EC 81 MG tablet Take 81 mg by mouth in the morning. Swallow whole.   cholecalciferol 25 MCG (1000 UNIT) tablet Commonly known as: VITAMIN D Take 1,000 Units by mouth in the morning.   HYDROcodone-acetaminophen 5-325 MG tablet Commonly known as: NORCO/VICODIN Take 1 tablet by mouth every 6 (six) hours as needed for moderate pain.   losartan 50 MG tablet Commonly known as: COZAAR Take 50 mg by mouth in the morning.   Ozempic (1 MG/DOSE) 4 MG/3ML Sopn Generic drug: Semaglutide (1 MG/DOSE) Inject 1 mg into the skin every Sunday.    pantoprazole 40 MG tablet Commonly known as: PROTONIX Take 40 mg by mouth in the morning.   pioglitazone 30 MG tablet Commonly known as: ACTOS Take 30 mg by mouth in the morning.   rosuvastatin 5 MG tablet Commonly known as: CRESTOR Take 5 mg by mouth every Monday, Wednesday, and Friday. In the morning   tamsulosin 0.4 MG Caps capsule Commonly known as: FLOMAX Take 1 capsule (0.4 mg total) by mouth daily.   Toujeo Max SoloStar 300 UNIT/ML Solostar Pen Generic drug: insulin glargine (2 Unit Dial) Inject 40 Units into the skin in the morning.   Xigduo XR 12-998 MG Tb24 Generic drug: Dapagliflozin-metFORMIN HCl ER Take 2 tablets by mouth every morning.        Allergies: No Known Allergies  Family History: Family History  Problem Relation Age of Onset   Prostate cancer Neg Hx    Kidney cancer Neg Hx     Social History:  reports that he has quit smoking. He has never used smokeless tobacco. He reports that he does not currently use alcohol. He reports that he does not currently use drugs.  ROS: Pertinent ROS in HPI  Physical Exam: Constitutional:  Well nourished. Alert and oriented, No acute distress. HEENT: [place] AT, mask in place.  Trachea midline Cardiovascular: No clubbing, cyanosis, or edema. Respiratory: Normal respiratory effort, no increased work of breathing. GU: No CVA tenderness.  No bladder fullness or masses.  Patient with uncircumcised phallus. Foreskin easily retracted  Urethral meatus is patent.  No penile discharge. No penile lesions or rashes. Scrotum without lesions, cysts, rashes and/or edema.   Neurologic: Grossly intact, no focal deficits, moving all 4 extremities. Psychiatric: Normal mood and affect.  Laboratory Data: Lab Results  Component Value Date   WBC 6.5 04/05/2021   HGB 15.0 04/05/2021   HCT 42.9 04/05/2021   MCV 92.1 04/05/2021   PLT 251 04/05/2021    Lab Results  Component Value Date   CREATININE 0.79 04/05/2021     Urinalysis Component     Latest Ref Rng & Units 04/21/2021  Specific Gravity, UA     1.005 - 1.030 1.015  pH, UA     5.0 - 7.5 5.0  Color, UA     Yellow Yellow  Appearance Ur     Clear Hazy (A)  Leukocytes,UA     Negative Negative  Protein,UA     Negative/Trace Negative  Glucose, UA     Negative 2+ (A)  Ketones, UA     Negative Trace (A)  RBC, UA     Negative 2+ (A)  Bilirubin, UA     Negative Negative  Urobilinogen, Ur     0.2 - 1.0 mg/dL 0.2  Nitrite, UA     Negative Negative  Microscopic Examination      See below:   Component     Latest  Ref Rng & Units 04/21/2021  WBC, UA     0 - 5 /hpf 6-10 (A)  RBC     0 - 2 /hpf 11-30 (A)  Epithelial Cells (non renal)     0 - 10 /hpf None seen  Bacteria, UA     None seen/Few None seen  I have reviewed the labs.   Pertinent Imaging: Results for SAHEL, VOGELSONG (MRN [id number]) as of 04/21/2021 14:38  Ref. Range 04/14/2021 10:20  Scan Result Unknown [id number]    Assessment & Plan:    1. Urinary retention -Patient does not want another indwelling Foley in place -I instructed patient on self-catheterization and I gave him 77 [place] coud straight caths-he is successfully passed the catheter in the room and had a PVR of 300 cc -He states he has not been taking his tamsulosin for the last couple of days, so he is going to start that today -He is call clinic or seek treatment in the ED if he is unable to urinate  -UA -urine sent for culture to rule out indolent infection as a cause of his urinary urgency, but is more likely due to his issues with retention  2. Prostate cancer -keep appointments in September with Dr. Donella Stade and in January with Dr. Bernardo Heater  Return for follow up pending urine culture results .  These notes generated with voice recognition software. I apologize for typographical errors.  Zara Council, PA-C  [facility] [address]  [place], [place] [address] [phone]

## 2021-04-21 ENCOUNTER — Ambulatory Visit: Payer: BC Managed Care – PPO | Admitting: Urology

## 2021-04-21 ENCOUNTER — Ambulatory Visit (INDEPENDENT_AMBULATORY_CARE_PROVIDER_SITE_OTHER): Payer: BC Managed Care – PPO | Admitting: Urology

## 2021-04-21 ENCOUNTER — Other Ambulatory Visit: Payer: Self-pay

## 2021-04-21 DIAGNOSIS — C61 Malignant neoplasm of prostate: Secondary | ICD-10-CM

## 2021-04-21 DIAGNOSIS — R339 Retention of urine, unspecified: Secondary | ICD-10-CM

## 2021-04-22 ENCOUNTER — Telehealth: Payer: Self-pay

## 2021-04-22 ENCOUNTER — Ambulatory Visit (INDEPENDENT_AMBULATORY_CARE_PROVIDER_SITE_OTHER): Payer: BC Managed Care – PPO

## 2021-04-22 DIAGNOSIS — R339 Retention of urine, unspecified: Secondary | ICD-10-CM

## 2021-04-22 LAB — URINALYSIS, COMPLETE
Bilirubin, UA: NEGATIVE
Leukocytes,UA: NEGATIVE
Nitrite, UA: NEGATIVE
Protein,UA: NEGATIVE
Specific Gravity, UA: 1.015 (ref 1.005–1.030)
Urobilinogen, Ur: 0.2 mg/dL (ref 0.2–1.0)
pH, UA: 5 (ref 5.0–7.5)

## 2021-04-22 LAB — BLADDER SCAN AMB NON-IMAGING: Scan Result: 380

## 2021-04-22 LAB — MICROSCOPIC EXAMINATION
Bacteria, UA: NONE SEEN
Epithelial Cells (non renal): NONE SEEN /hpf (ref 0–10)

## 2021-04-22 NOTE — Patient Instructions (Signed)
Indwelling Urinary Catheter Care, Adult An indwelling urinary catheter is a thin tube that is put into your bladder. The tube helps to drain pee (urine) out of your body. The tube goes in through your urethra. Your urethra is where pee comes out of your body. Your pee will come out through the catheter, then it will go into a bag (drainage bag). Take good care of your catheter so it will work well. How to wear your catheter and bag Supplies needed Sticky tape (adhesive tape) or a leg strap. Alcohol wipe or soap and water (if you use tape). A clean towel (if you use tape). Large overnight bag. Smaller bag (leg bag). Wearing your catheter Attach your catheter to your leg with tape or a leg strap. Make sure the catheter is not pulled tight. If a leg strap gets wet, take it off and put on a dry strap. If you use tape to hold the bag on your leg: Use an alcohol wipe or soap and water to wash your skin where the tape made it sticky before. Use a clean towel to pat-dry that skin. Use new tape to make the bag stay on your leg. Wearing your bags You should have been given a large overnight bag. You may wear the overnight bag in the day or night. Always have the overnight bag lower than your bladder.  Do not let the bag touch the floor. Before you go to sleep, put a clean plastic bag in a wastebasket. Then hang the overnight bag inside the wastebasket. You should also have a smaller leg bag that fits under your clothes. Always wear the leg bag below your knee. Do not wear your leg bag at night. How to care for your skin and catheter Supplies needed A clean washcloth. Water and mild soap. A clean towel. Caring for your skin and catheter     Clean the skin around your catheter every day: Wash your hands with soap and water. Wet a clean washcloth in warm water and mild soap. Clean the skin around your urethra. If you are male: Gently spread the folds of skin around your vagina  (labia). With the washcloth in your other hand, wipe the inner side of your labia on each side. Wipe from front to back. If you are male: Pull back any skin that covers the end of your penis (foreskin). With the washcloth in your other hand, wipe your penis in small circles. Start wiping at the tip of your penis, then move away from the catheter. Move the foreskin back in place, if needed. With your free hand, hold the catheter close to where it goes into your body. Keep holding the catheter during cleaning so it does not get pulled out. With the washcloth in your other hand, clean the catheter. Only wipe downward on the catheter, toward the drainage bag. Do not wipe upward toward your body. Doing this may push germs into your urethra and cause infection. Use a clean towel to pat-dry the catheter and the skin around it. Make sure to wipe off all soap. Wash your hands with soap and water. Shower every day. Do not take baths. Do not use cream, ointment, or lotion on the area where the catheter goes into your body, unless your doctor tells you to. Do not use powders, sprays, or lotions on your genital area. Check your skin around the catheter every day for signs of infection. Check for: Redness, swelling, or pain. Fluid or blood. Warmth. Pus   or a bad smell. How to empty the bag Supplies needed Rubbing alcohol. Gauze pad or cotton ball. Tape or a leg strap. Emptying the bag Pour the pee out of your bag when it is ?- full, or at least 2-3 times a day. Do this for your overnight bag and your leg bag. Wash your hands with soap and water. Separate (detach) the bag from your leg. Hold the bag over the toilet or a clean pail. Keep the bag lower than your hips and bladder. This is so the pee (urine) does not go back into the tube. Open the pour spout. It is at the bottom of the bag. Empty the pee into the toilet or pail. Do not let the pour spout touch any surface. Put rubbing alcohol on a  gauze pad or cotton ball. Use the gauze pad or cotton ball to clean the pour spout. Close the pour spout. Attach the bag to your leg with tape or a leg strap. Wash your hands with soap and water. Follow instructions for cleaning the drainage bag: From the product maker. As told by your doctor. How to change the bag Changing the bag Replace your bag when it starts to leak, smell bad, or look dirty. Wash your hands with soap and water. Separate the dirty bag from your leg. Pinch the catheter with your fingers so that pee does not spill out. Separate the catheter tube from the bag tube where these tubes connect (at the connection valve). Do not let the tubes touch any surface. Clean the end of the catheter tube with an alcohol wipe. Use a different alcohol wipe to clean the end of the bag tube. Connect the catheter tube to the tube of the clean bag. Attach the clean bag to your leg with tape or a leg strap. Do not make the bag tight on your leg. Wash your hands with soap and water. General rules  Never pull on your catheter. Never try to take it out. Doing that can hurt you. Always wash your hands before and after you touch your catheter or bag. Use a mild, fragrance-free soap. If you do not have soap and water, use hand sanitizer. Always make sure there are no twists or bends (kinks) in the catheter tube. Always make sure there are no leaks in the catheter or bag. Drink enough fluid to keep your pee pale yellow. Do not take baths, swim, or use a hot tub. If you are male, wipe from front to back after you poop (have a bowel movement). Contact a doctor if: Your pee is cloudy. Your pee smells worse than usual. Your catheter gets clogged. Your catheter leaks. Your bladder feels full. Get help right away if: You have redness, swelling, or pain where the catheter goes into your body. You have fluid, blood, pus, or a bad smell coming from the area where the catheter goes into your  body. Your skin feels warm where the catheter goes into your body. You have a fever. You have pain in your: Belly (abdomen). Legs. Lower back. Bladder. You see blood in the catheter. Your pee is pink or red. You feel sick to your stomach (nauseous). You throw up (vomit). You have chills. Your pee is not draining into the bag. Your catheter gets pulled out. Summary An indwelling urinary catheter is a thin tube that is placed into the bladder to help drain pee (urine) out of the body. The catheter is placed into the part of the body   that drains pee from the bladder (urethra). Taking good care of your catheter will keep it working properly and help prevent problems. Always wash your hands before and after touching your catheter or bag. Never pull on your catheter or try to take it out. This information is not intended to replace advice given to you by your health care provider. Make sure you discuss any questions you have with your healthcare provider. Document Revised: 07/30/2020 Document Reviewed: 07/30/2020 Elsevier Patient Education  2022 [employer]vier Inc[employer]      

## 2021-04-22 NOTE — Telephone Encounter (Signed)
Pt wife called stating pt feels he is not emptying completely after CIC. He states he feels full if he pushes on his bladder and is worried and wants to have another catheter placed. I added patient on to nurse schedule today for PVR. Wife and pt verbalized understanding

## 2021-04-22 NOTE — Progress Notes (Signed)
Simple Catheter Placement  Due to urinary retention patient is present today for a foley cath placement.  Patient was cleaned and prepped in a sterile fashion with betadine. A 16 coude  FR foley catheter was inserted, urine return was noted  400 ml, urine was yellow in color.  The balloon was filled with 10cc of sterile water.  A leg bag was attached for drainage. Patient was also given a night bag to take home and was given instruction on how to change from one bag to another.  Patient was given instruction on proper catheter care.  Patient tolerated well, no complications were noted   Performed by: Kerman Passey, rma  Additional notes/ Follow up: 05/12/2021 with PA

## 2021-04-24 LAB — CULTURE, URINE COMPREHENSIVE

## 2021-04-25 ENCOUNTER — Encounter: Payer: Self-pay | Admitting: Urology

## 2021-05-03 NOTE — Progress Notes (Signed)
05/04/2021 2:58 PM   Dylan Pittman [birth year] [id number]  Referring provider: Sofie Hartigan, MD [place],  [place] [address]  Urological history: 1. Prostate cancer -T1c adenocarcinoma prostate (NCCN intermediate risk-unfavorable) -PSA Trend Component     Latest Ref Rng & Units 11/19/2017 06/07/2018 12/12/2018 06/13/2019  Prostate Specific Ag, Serum     0.0 - 4.0 ng/mL 9.7 (H) 9.0 (H) 10.2 (H) 9.6 (H)   Component     Latest Ref Rng & Units 07/29/2020 09/08/2020  Prostate Specific Ag, Serum     0.0 - 4.0 ng/mL 13.8 (H) 12.0 (H)  He underwent cognitive biopsy for PI-RADS 5 lesion 11/05/2020.  No postbiopsy complaints.   PSA 13.8, 45 g gland on TRUS; 16 cores obtained   Pathology: 3 cores with Gleason 4+3 adenocarcinoma and one core with Gleason 3+4 adenocarcinoma as below.    -s/p I-125 prostate interstitial implant, cystoscopy   2. Urinary retention -marked bladder neck elevation on cysto 03/2021 -post-op seed implant  Chief Complaint  Patient presents with   Urinary Retention    HPI: Dylan Pittman is a 70 y.o. male who presents for a TOV.  He drank 40 ounces once he left the office here when his catheter was removed.  He states he has been voiding well with a stronger stream each time.  He has been urinating about every 45 minutes.  Patient denies any modifying or aggravating factors.  Patient denies any gross hematuria, dysuria or suprapubic/flank pain.  Patient denies any fevers, chills, nausea or vomiting.    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    Surgical History: Past  Surgical History:  Procedure Laterality Date   [place], 2005   CLOSED REDUCTION FINGER WITH PERCUTANEOUS PINNING Left 02/12/2020   Procedure: Closed reduction and pinning of left first metacarpal fracture with possible open reduction;  Surgeon: Hessie Knows, MD;  Location: [facility] ORS;  Service: Orthopedics;  Laterality: Left;   COLONOSCOPY WITH PROPOFOL N/A 12/28/2017   Procedure: COLONOSCOPY WITH PROPOFOL;  Surgeon: Manya Silvas, MD;  Location: [facility] ENDOSCOPY;  Service: Endoscopy;  Laterality: N/A;   NASAL SEPTUM SURGERY     RADIOACTIVE SEED IMPLANT N/A 04/12/2021   Procedure: RADIOACTIVE SEED IMPLANT/BRACHYTHERAPY IMPLANT;  Surgeon: Abbie Sons, MD;  Location: [facility] ORS;  Service: Urology;  Laterality: N/A;  73 seeds implanted   VASECTOMY  2005    Home Medications:  Allergies as of 05/04/2021   No Known Allergies      Medication List        Accurate as of May 04, 2021  2:58 PM. If you have any questions, ask your nurse or doctor.          albuterol 108 (90 Base) MCG/ACT inhaler Commonly known as: VENTOLIN HFA Inhale 2 puffs into the lungs every 6 (six) hours as needed for wheezing or shortness of breath.   aspirin EC 81 MG tablet Take 81 mg by mouth in the morning. Swallow whole.   cholecalciferol 25 MCG (1000 UNIT) tablet Commonly known as: VITAMIN D Take 1,000 Units by mouth in the  morning.   HYDROcodone-acetaminophen 5-325 MG tablet Commonly known as: NORCO/VICODIN Take 1 tablet by mouth every 6 (six) hours as needed for moderate pain.   losartan 50 MG tablet Commonly known as: COZAAR Take 50 mg by mouth in the morning.   Ozempic (1 MG/DOSE) 4 MG/3ML Sopn Generic drug: Semaglutide (1 MG/DOSE) Inject 1 mg into the skin every Sunday.   pantoprazole 40 MG tablet Commonly known as: PROTONIX Take 40 mg by mouth in the morning.   pioglitazone 30 MG tablet Commonly known as: ACTOS Take 30 mg by mouth in the morning.   rosuvastatin 5 MG  tablet Commonly known as: CRESTOR Take 5 mg by mouth every Monday, Wednesday, and Friday. In the morning   tamsulosin 0.4 MG Caps capsule Commonly known as: FLOMAX Take 1 capsule (0.4 mg total) by mouth daily.   Toujeo Max SoloStar 300 UNIT/ML Solostar Pen Generic drug: insulin glargine (2 Unit Dial) Inject 40 Units into the skin in the morning.   Xigduo XR 12-998 MG Tb24 Generic drug: Dapagliflozin-metFORMIN HCl ER Take 2 tablets by mouth every morning.        Allergies: No Known Allergies  Family History: Family History  Problem Relation Age of Onset   Prostate cancer Neg Hx    Kidney cancer Neg Hx     Social History:  reports that he has quit smoking. He has never used smokeless tobacco. He reports that he does not currently use alcohol. He reports that he does not currently use drugs.  ROS: Pertinent ROS in HPI  Physical Exam: Constitutional:  Well nourished. Alert and oriented, No acute distress. HEENT: [place] AT, mask in place.  Trachea midline Cardiovascular: No clubbing, cyanosis, or edema. Respiratory: Normal respiratory effort, no increased work of breathing. Neurologic: Grossly intact, no focal deficits, moving all 4 extremities. Psychiatric: Normal mood and affect.   Laboratory Data: N/A   Pertinent Imaging: Results for GEMAYEL, BELISLE (MRN [id number]) as of 05/04/2021 14:49  Ref. Range 05/04/2021 14:49  Scan Result Unknown 262m     Assessment & Plan:    1. Urinary retention -PVR < 300 cc -will return in one month for I PSS and PVR  -continue the tamsulosin 0.4 mg daily   2. Prostate cancer -keep appointments in September with Dr. CDonella Stadeand in January with Dr. SBernardo Heater Return in about 1 month (around 06/03/2021) for IPSS and PVR.  These notes generated with voice recognition software. I apologize for typographical errors.  SZara Council PA-C  [facility] [address] SGlen ArborBAber[facility] [place] [phone]

## 2021-05-04 ENCOUNTER — Ambulatory Visit (INDEPENDENT_AMBULATORY_CARE_PROVIDER_SITE_OTHER): Payer: BC Managed Care – PPO | Admitting: Urology

## 2021-05-04 ENCOUNTER — Other Ambulatory Visit: Payer: Self-pay

## 2021-05-04 ENCOUNTER — Ambulatory Visit: Payer: BC Managed Care – PPO | Admitting: Urology

## 2021-05-04 VITALS — BP 121/67 | HR 88

## 2021-05-04 DIAGNOSIS — C61 Malignant neoplasm of prostate: Secondary | ICD-10-CM

## 2021-05-04 DIAGNOSIS — R339 Retention of urine, unspecified: Secondary | ICD-10-CM

## 2021-05-04 LAB — BLADDER SCAN AMB NON-IMAGING

## 2021-05-04 NOTE — Progress Notes (Signed)
Catheter Removal  Patient is present today for a catheter removal.  65m of water was drained from the balloon. A 16 coudeFR foley cath was removed from the bladder no complications were noted . Patient tolerated well.  Performed by: SFonnie Jarvis CMA  Follow up/ Additional notes: PM PVR

## 2021-05-09 ENCOUNTER — Other Ambulatory Visit: Payer: Self-pay

## 2021-05-09 ENCOUNTER — Emergency Department: Payer: BC Managed Care – PPO

## 2021-05-09 ENCOUNTER — Encounter: Payer: Self-pay | Admitting: Family Medicine

## 2021-05-09 ENCOUNTER — Inpatient Hospital Stay
Admission: EM | Admit: 2021-05-09 | Discharge: 2021-05-13 | DRG: 177 | Disposition: A | Discharge status: Home / Self Care | Payer: BC Managed Care – PPO | Attending: Internal Medicine | Admitting: Internal Medicine

## 2021-05-09 DIAGNOSIS — E119 Type 2 diabetes mellitus without complications: Secondary | ICD-10-CM

## 2021-05-09 DIAGNOSIS — E1165 Type 2 diabetes mellitus with hyperglycemia: Secondary | ICD-10-CM | POA: Diagnosis not present

## 2021-05-09 DIAGNOSIS — J9621 Acute and chronic respiratory failure with hypoxia: Secondary | ICD-10-CM | POA: Diagnosis not present

## 2021-05-09 DIAGNOSIS — E1169 Type 2 diabetes mellitus with other specified complication: Secondary | ICD-10-CM | POA: Diagnosis present

## 2021-05-09 DIAGNOSIS — N400 Enlarged prostate without lower urinary tract symptoms: Secondary | ICD-10-CM

## 2021-05-09 DIAGNOSIS — I10 Essential (primary) hypertension: Secondary | ICD-10-CM | POA: Diagnosis present

## 2021-05-09 DIAGNOSIS — Z79899 Other long term (current) drug therapy: Secondary | ICD-10-CM | POA: Diagnosis not present

## 2021-05-09 DIAGNOSIS — E1121 Type 2 diabetes mellitus with diabetic nephropathy: Secondary | ICD-10-CM | POA: Diagnosis present

## 2021-05-09 DIAGNOSIS — Z8546 Personal history of malignant neoplasm of prostate: Secondary | ICD-10-CM

## 2021-05-09 DIAGNOSIS — J9601 Acute respiratory failure with hypoxia: Secondary | ICD-10-CM | POA: Diagnosis present

## 2021-05-09 DIAGNOSIS — Y92239 Unspecified place in hospital as the place of occurrence of the external cause: Secondary | ICD-10-CM | POA: Diagnosis not present

## 2021-05-09 DIAGNOSIS — U071 COVID-19: Secondary | ICD-10-CM | POA: Diagnosis present

## 2021-05-09 DIAGNOSIS — T380X5A Adverse effect of glucocorticoids and synthetic analogues, initial encounter: Secondary | ICD-10-CM | POA: Diagnosis not present

## 2021-05-09 DIAGNOSIS — Z794 Long term (current) use of insulin: Secondary | ICD-10-CM | POA: Diagnosis not present

## 2021-05-09 DIAGNOSIS — K219 Gastro-esophageal reflux disease without esophagitis: Secondary | ICD-10-CM | POA: Diagnosis present

## 2021-05-09 DIAGNOSIS — G4730 Sleep apnea, unspecified: Secondary | ICD-10-CM | POA: Diagnosis present

## 2021-05-09 DIAGNOSIS — E785 Hyperlipidemia, unspecified: Secondary | ICD-10-CM | POA: Diagnosis present

## 2021-05-09 DIAGNOSIS — E669 Obesity, unspecified: Secondary | ICD-10-CM | POA: Diagnosis present

## 2021-05-09 DIAGNOSIS — J440 Chronic obstructive pulmonary disease with acute lower respiratory infection: Secondary | ICD-10-CM | POA: Diagnosis present

## 2021-05-09 DIAGNOSIS — C61 Malignant neoplasm of prostate: Secondary | ICD-10-CM | POA: Diagnosis not present

## 2021-05-09 DIAGNOSIS — Z87891 Personal history of nicotine dependence: Secondary | ICD-10-CM

## 2021-05-09 DIAGNOSIS — Z7982 Long term (current) use of aspirin: Secondary | ICD-10-CM

## 2021-05-09 DIAGNOSIS — J1282 Pneumonia due to coronavirus disease 2019: Secondary | ICD-10-CM | POA: Diagnosis present

## 2021-05-09 DIAGNOSIS — Z28310 Unvaccinated for covid-19: Secondary | ICD-10-CM

## 2021-05-09 DIAGNOSIS — E1122 Type 2 diabetes mellitus with diabetic chronic kidney disease: Secondary | ICD-10-CM

## 2021-05-09 DIAGNOSIS — Z6833 Body mass index (BMI) 33.0-33.9, adult: Secondary | ICD-10-CM | POA: Diagnosis not present

## 2021-05-09 DIAGNOSIS — Z23 Encounter for immunization: Secondary | ICD-10-CM | POA: Diagnosis not present

## 2021-05-09 LAB — BLOOD CULTURE ID PANEL (REFLEXED) - BCID2

## 2021-05-09 LAB — CBC
HCT: 42.2 % (ref 39.0–52.0)
Hemoglobin: 14.9 g/dL (ref 13.0–17.0)
MCH: 32.2 pg (ref 26.0–34.0)
MCHC: 35.3 g/dL (ref 30.0–36.0)
MCV: 91.1 fL (ref 80.0–100.0)
Platelets: 203 10*3/uL (ref 150–400)
RBC: 4.63 MIL/uL (ref 4.22–5.81)
RDW: 12.3 % (ref 11.5–15.5)
WBC: 7.6 10*3/uL (ref 4.0–10.5)
nRBC: 0 % (ref 0.0–0.2)

## 2021-05-09 LAB — RESP PANEL BY RT-PCR (FLU A&B, COVID) ARPGX2
Influenza A by PCR: NEGATIVE
Influenza B by PCR: NEGATIVE
SARS Coronavirus 2 by RT PCR: POSITIVE — AB

## 2021-05-09 LAB — LACTIC ACID, PLASMA
Lactic Acid, Venous: 1.9 mmol/L (ref 0.5–1.9)
Lactic Acid, Venous: 1.3 mmol/L (ref 0.5–1.9)

## 2021-05-09 LAB — BRAIN NATRIURETIC PEPTIDE: B Natriuretic Peptide: 21.6 pg/mL (ref 0.0–100.0)

## 2021-05-09 LAB — COMPREHENSIVE METABOLIC PANEL
ALT: 26 U/L (ref 0–44)
AST: 38 U/L (ref 15–41)
Albumin: 3.4 g/dL — ABNORMAL LOW (ref 3.5–5.0)
Alkaline Phosphatase: 89 U/L (ref 38–126)
Anion gap: 12 (ref 5–15)
BUN: 21 mg/dL (ref 8–23)
CO2: 26 mmol/L (ref 22–32)
Calcium: 8.5 mg/dL — ABNORMAL LOW (ref 8.9–10.3)
Chloride: 95 mmol/L — ABNORMAL LOW (ref 98–111)
Creatinine, Ser: 0.93 mg/dL (ref 0.61–1.24)
GFR, Estimated: >60 mL/min (ref >60)
Glucose, Bld: 177 mg/dL — ABNORMAL HIGH (ref 70–99)
Potassium: 4 mmol/L (ref 3.5–5.1)
Sodium: 133 mmol/L — ABNORMAL LOW (ref 135–145)
Total Bilirubin: 1 mg/dL (ref 0.3–1.2)
Total Protein: 7.9 g/dL (ref 6.5–8.1)

## 2021-05-09 LAB — GLUCOSE, CAPILLARY
Glucose-Capillary: 360 mg/dL — ABNORMAL HIGH (ref 70–99)
Glucose-Capillary: 274 mg/dL — ABNORMAL HIGH (ref 70–99)
Glucose-Capillary: 222 mg/dL — ABNORMAL HIGH (ref 70–99)

## 2021-05-09 LAB — C-REACTIVE PROTEIN: CRP: 13.6 mg/dL — ABNORMAL HIGH (ref <1.0)

## 2021-05-09 LAB — FIBRINOGEN: Fibrinogen: 587 mg/dL — ABNORMAL HIGH (ref 210–475)

## 2021-05-09 LAB — HEMOGLOBIN A1C
Hgb A1c MFr Bld: 8.6 % — ABNORMAL HIGH (ref 4.8–5.6)
Mean Plasma Glucose: 200.12 mg/dL

## 2021-05-09 LAB — TROPONIN I (HIGH SENSITIVITY)
Troponin I (High Sensitivity): 11 ng/L (ref <18)
Troponin I (High Sensitivity): 9 ng/L (ref <18)

## 2021-05-09 LAB — LACTATE DEHYDROGENASE: LDH: 274 U/L — ABNORMAL HIGH (ref 98–192)

## 2021-05-09 LAB — D-DIMER, QUANTITATIVE: D-Dimer, Quant: 0.84 ug/mL-FEU — ABNORMAL HIGH (ref 0.00–0.50)

## 2021-05-09 LAB — FERRITIN: Ferritin: 318 ng/mL (ref 24–336)

## 2021-05-09 LAB — CBG MONITORING, ED: Glucose-Capillary: 232 mg/dL — ABNORMAL HIGH (ref 70–99)

## 2021-05-09 LAB — HIV ANTIBODY (ROUTINE TESTING W REFLEX): HIV Screen 4th Generation wRfx: NONREACTIVE

## 2021-05-09 LAB — PROCALCITONIN: Procalcitonin: <0.1 ng/mL

## 2021-05-09 MED ORDER — REMDESIVIR 100 MG IV SOLR: 100.0000 mg | Freq: Every day | INTRAVENOUS | Status: DC

## 2021-05-09 MED ORDER — TRAZODONE HCL 50 MG PO TABS
25.0000 mg | ORAL_TABLET | Freq: Every evening | ORAL | Status: DC | PRN
  Filled 2021-05-09: qty 1

## 2021-05-09 MED ORDER — METFORMIN HCL ER 750 MG PO TB24
2000.0000 mg | ORAL_TABLET | Freq: Every day | ORAL | Status: DC
  Filled 2021-05-09: qty 1

## 2021-05-09 MED ORDER — IPRATROPIUM-ALBUTEROL 20-100 MCG/ACT IN AERS: 2.0000 | INHALATION_SPRAY | Freq: Four times a day (QID) | RESPIRATORY_TRACT | Status: DC

## 2021-05-09 MED ORDER — LACTATED RINGERS IV BOLUS
1000.0000 mL | Freq: Once | INTRAVENOUS | Status: AC
  Administered 2021-05-09: 1000 mL via INTRAVENOUS

## 2021-05-09 MED ORDER — DAPAGLIFLOZIN PRO-METFORMIN ER 5-1000 MG PO TB24: 2.0000 | ORAL_TABLET | Freq: Every morning | ORAL | Status: DC

## 2021-05-09 MED ORDER — GUAIFENESIN ER 600 MG PO TB12
600.0000 mg | ORAL_TABLET | Freq: Two times a day (BID) | ORAL | Status: DC
  Administered 2021-05-09 – 2021-05-13 (×9): 600 mg via ORAL
  Filled 2021-05-09 (×9): qty 1

## 2021-05-09 MED ORDER — ACETAMINOPHEN 500 MG PO TABS
1000.0000 mg | ORAL_TABLET | Freq: Once | ORAL | Status: AC
  Administered 2021-05-09: 1000 mg via ORAL
  Filled 2021-05-09: qty 2

## 2021-05-09 MED ORDER — INFLUENZA VAC A&B SA ADJ QUAD 0.5 ML IM PRSY
0.5000 mL | PREFILLED_SYRINGE | INTRAMUSCULAR | Status: AC
  Administered 2021-05-10: 11:00:00 0.5 mL via INTRAMUSCULAR
  Filled 2021-05-09: qty 0.5

## 2021-05-09 MED ORDER — ASCORBIC ACID 500 MG PO TABS
1000.0000 mg | ORAL_TABLET | Freq: Every day | ORAL | Status: DC
  Administered 2021-05-09 – 2021-05-13 (×5): 1000 mg via ORAL
  Filled 2021-05-09 (×5): qty 2

## 2021-05-09 MED ORDER — VITAMIN D 25 MCG (1000 UNIT) PO TABS
1000.0000 [IU] | ORAL_TABLET | Freq: Every day | ORAL | Status: DC
  Administered 2021-05-09 – 2021-05-13 (×5): 1000 [IU] via ORAL
  Filled 2021-05-09 (×5): qty 1

## 2021-05-09 MED ORDER — METHYLPREDNISOLONE SODIUM SUCC 125 MG IJ SOLR
125.0000 mg | Freq: Once | INTRAMUSCULAR | Status: AC
  Administered 2021-05-09: 125 mg via INTRAVENOUS
  Filled 2021-05-09: qty 2

## 2021-05-09 MED ORDER — ZINC SULFATE 220 (50 ZN) MG PO CAPS
220.0000 mg | ORAL_CAPSULE | Freq: Every day | ORAL | Status: DC
  Administered 2021-05-09 – 2021-05-13 (×5): 220 mg via ORAL
  Filled 2021-05-09 (×5): qty 1

## 2021-05-09 MED ORDER — TAMSULOSIN HCL 0.4 MG PO CAPS
0.4000 mg | ORAL_CAPSULE | Freq: Every day | ORAL | Status: DC
  Administered 2021-05-09 – 2021-05-13 (×5): 0.4 mg via ORAL
  Filled 2021-05-09 (×5): qty 1

## 2021-05-09 MED ORDER — REMDESIVIR 100 MG IV SOLR
100.0000 mg | Freq: Every day | INTRAVENOUS | Status: AC
Start: 2021-05-10 — End: 2021-05-13
  Administered 2021-05-10 – 2021-05-13 (×4): 100 mg via INTRAVENOUS
  Filled 2021-05-09 (×4): qty 100

## 2021-05-09 MED ORDER — INSULIN ASPART 100 UNIT/ML IJ SOLN
0.0000 [IU] | Freq: Three times a day (TID) | INTRAMUSCULAR | Status: DC
  Administered 2021-05-09: 18:00:00 8 [IU] via SUBCUTANEOUS
  Administered 2021-05-09: 15 [IU] via SUBCUTANEOUS
  Administered 2021-05-09 – 2021-05-10 (×3): 5 [IU] via SUBCUTANEOUS
  Administered 2021-05-10 (×2): 11 [IU] via SUBCUTANEOUS
  Filled 2021-05-09 (×7): qty 1

## 2021-05-09 MED ORDER — PIOGLITAZONE HCL 30 MG PO TABS
30.0000 mg | ORAL_TABLET | Freq: Every day | ORAL | Status: DC
  Filled 2021-05-09: qty 1

## 2021-05-09 MED ORDER — DAPAGLIFLOZIN PROPANEDIOL 5 MG PO TABS
10.0000 mg | ORAL_TABLET | Freq: Every day | ORAL | Status: DC
  Filled 2021-05-09: qty 2

## 2021-05-09 MED ORDER — SODIUM CHLORIDE 0.9 % IV SOLN
200.0000 mg | Freq: Once | INTRAVENOUS | Status: AC
  Administered 2021-05-09: 200 mg via INTRAVENOUS
  Filled 2021-05-09: qty 200

## 2021-05-09 MED ORDER — INSULIN ASPART 100 UNIT/ML IJ SOLN
6.0000 [IU] | Freq: Three times a day (TID) | INTRAMUSCULAR | Status: DC
  Administered 2021-05-09 – 2021-05-10 (×4): 6 [IU] via SUBCUTANEOUS
  Filled 2021-05-09 (×4): qty 1

## 2021-05-09 MED ORDER — IPRATROPIUM-ALBUTEROL 0.5-2.5 (3) MG/3ML IN SOLN
3.0000 mL | Freq: Once | RESPIRATORY_TRACT | Status: AC
  Administered 2021-05-09: 3 mL via RESPIRATORY_TRACT
  Filled 2021-05-09: qty 3

## 2021-05-09 MED ORDER — METHYLPREDNISOLONE SODIUM SUCC 125 MG IJ SOLR
1.0000 mg/kg | Freq: Two times a day (BID) | INTRAMUSCULAR | Status: AC
  Administered 2021-05-09 – 2021-05-11 (×5): 100.625 mg via INTRAVENOUS
  Filled 2021-05-09 (×4): qty 2

## 2021-05-09 MED ORDER — GUAIFENESIN-DM 100-10 MG/5ML PO SYRP: 10.0000 mL | ORAL_SOLUTION | ORAL | Status: DC | PRN

## 2021-05-09 MED ORDER — MAGNESIUM HYDROXIDE 400 MG/5ML PO SUSP
30.0000 mL | Freq: Every day | ORAL | Status: DC | PRN
  Administered 2021-05-11 – 2021-05-12 (×2): 30 mL via ORAL
  Filled 2021-05-09 (×3): qty 30

## 2021-05-09 MED ORDER — PNEUMOCOCCAL VAC POLYVALENT 25 MCG/0.5ML IJ INJ
0.5000 mL | INJECTION | INTRAMUSCULAR | Status: AC
  Administered 2021-05-10: 09:00:00 0.5 mL via INTRAMUSCULAR
  Filled 2021-05-09: qty 0.5

## 2021-05-09 MED ORDER — ENOXAPARIN SODIUM 60 MG/0.6ML IJ SOSY
0.5000 mg/kg | PREFILLED_SYRINGE | INTRAMUSCULAR | Status: DC
  Administered 2021-05-09 – 2021-05-13 (×5): 50 mg via SUBCUTANEOUS
  Filled 2021-05-09 (×5): qty 0.6

## 2021-05-09 MED ORDER — ASPIRIN EC 81 MG PO TBEC
81.0000 mg | DELAYED_RELEASE_TABLET | Freq: Every day | ORAL | Status: DC
  Administered 2021-05-09 – 2021-05-13 (×5): 81 mg via ORAL
  Filled 2021-05-09 (×5): qty 1

## 2021-05-09 MED ORDER — SEMAGLUTIDE (1 MG/DOSE) 4 MG/3ML ~~LOC~~ SOPN: 1.0000 mg | PEN_INJECTOR | SUBCUTANEOUS | Status: DC

## 2021-05-09 MED ORDER — HYDROCOD POLST-CPM POLST ER 10-8 MG/5ML PO SUER: 5.0000 mL | Freq: Two times a day (BID) | ORAL | Status: DC | PRN

## 2021-05-09 MED ORDER — VITAMIN D3 25 MCG (1000 UNIT) PO TABS: 1000.0000 [IU] | ORAL_TABLET | Freq: Every morning | ORAL | Status: DC

## 2021-05-09 MED ORDER — SODIUM CHLORIDE 0.9 % IV SOLN: 200.0000 mg | Freq: Once | INTRAVENOUS | Status: DC

## 2021-05-09 MED ORDER — SODIUM CHLORIDE 0.9 % IV SOLN: INTRAVENOUS | Status: DC

## 2021-05-09 MED ORDER — ONDANSETRON HCL 4 MG/2ML IJ SOLN: 4.0000 mg | Freq: Four times a day (QID) | INTRAMUSCULAR | Status: DC | PRN

## 2021-05-09 MED ORDER — IPRATROPIUM-ALBUTEROL 0.5-2.5 (3) MG/3ML IN SOLN: 3.0000 mL | Freq: Four times a day (QID) | RESPIRATORY_TRACT | Status: DC

## 2021-05-09 MED ORDER — LOSARTAN POTASSIUM 50 MG PO TABS
50.0000 mg | ORAL_TABLET | Freq: Every day | ORAL | Status: DC
  Administered 2021-05-09 – 2021-05-13 (×5): 50 mg via ORAL
  Filled 2021-05-09 (×5): qty 1

## 2021-05-09 MED ORDER — HYDROCOD POLST-CPM POLST ER 10-8 MG/5ML PO SUER
5.0000 mL | Freq: Two times a day (BID) | ORAL | Status: DC | PRN
  Administered 2021-05-11 – 2021-05-12 (×2): 5 mL via ORAL
  Filled 2021-05-09 (×2): qty 5

## 2021-05-09 MED ORDER — ROSUVASTATIN CALCIUM 10 MG PO TABS
5.0000 mg | ORAL_TABLET | Freq: Every day | ORAL | Status: DC
  Administered 2021-05-09 – 2021-05-12 (×4): 5 mg via ORAL
  Filled 2021-05-09 (×4): qty 1

## 2021-05-09 MED ORDER — PANTOPRAZOLE SODIUM 40 MG PO TBEC
40.0000 mg | DELAYED_RELEASE_TABLET | Freq: Every morning | ORAL | Status: DC
  Administered 2021-05-09 – 2021-05-12 (×4): 40 mg via ORAL
  Filled 2021-05-09 (×3): qty 1

## 2021-05-09 MED ORDER — PREDNISONE 50 MG PO TABS
50.0000 mg | ORAL_TABLET | Freq: Every day | ORAL | Status: DC
  Administered 2021-05-12: 08:00:00 50 mg via ORAL
  Filled 2021-05-09: qty 1

## 2021-05-09 MED ORDER — INSULIN GLARGINE-YFGN 100 UNIT/ML ~~LOC~~ SOLN
40.0000 [IU] | Freq: Every day | SUBCUTANEOUS | Status: DC
  Administered 2021-05-09 – 2021-05-10 (×2): 40 [IU] via SUBCUTANEOUS
  Filled 2021-05-09 (×2): qty 0.4

## 2021-05-09 MED ORDER — ALBUTEROL SULFATE HFA 108 (90 BASE) MCG/ACT IN AERS
1.0000 | INHALATION_SPRAY | RESPIRATORY_TRACT | Status: DC | PRN
  Filled 2021-05-09: qty 6.7

## 2021-05-09 MED ORDER — GLIMEPIRIDE 4 MG PO TABS
4.0000 mg | ORAL_TABLET | Freq: Every day | ORAL | Status: DC
  Filled 2021-05-09: qty 1

## 2021-05-09 MED ORDER — ONDANSETRON HCL 4 MG PO TABS: 4.0000 mg | ORAL_TABLET | Freq: Four times a day (QID) | ORAL | Status: DC | PRN

## 2021-05-09 NOTE — Progress Notes (Signed)
PROGRESS NOTE    Dylan Pittman  [id number] DOB: 01/31/1951 DOA: 05/09/2021 PCP: Sofie Hartigan, MD      Assessment & Plan:   Active Problems:   Acute hypoxemic respiratory failure due to COVID-19 [facility])   Acute hypoxic respiratory failure: secondary to COVID-19. Continue on supplemental oxygen and wean as tolerated  COVID19 pneumonia: continue on IV remdesivir, IV steroids, zinc, bronchodilators and encourage incentive spirometry. Continue on supplemental oxygen and wean as tolerated. Continue on airborne and contact precautions   DM2: likely poorly controlled. Continue on glargine, SSI w/ accuchecks. Hold all home oral anti-DM meds   HLD: continue on statin    BPH: continue on home dose of tamsulosin   Hx of prostate cancer: management per onco as an outpatient    DVT prophylaxis: lovenox Code Status: full Family Communication:  Disposition Plan: likely d/c back home   Level of care: Med-Surg  Status is: Inpatient  Remains inpatient appropriate because:IV treatments appropriate due to intensity of illness or inability to take PO and Inpatient level of care appropriate due to severity of illness  Dispo: The patient is from: Home              Anticipated d/c is to: Home              Patient currently is not medically stable to d/c.   Difficult to place patient : unclear    Consultants:    Procedures:   Antimicrobials:    Subjective: Pt c/o intermittent coughing   Objective: Vitals:   05/09/21 0449 05/09/21 0700 05/09/21 0730 05/09/21 0800  BP:  116/66 110/65 121/72  Pulse: (!) 117 (!) 102 (!) 102 (!) 101  Resp: 20 (!) 21 (!) 22 (!) 23  Temp:      SpO2: (!) 86% 96% 95% 94%  Weight:      Height:        Intake/Output Summary (Last 24 hours) at 05/09/2021 0825 Last data filed at 05/09/2021 0811 Gross per 24 hour  Intake --  Output 750 ml  Net -750 ml   Filed Weights   05/09/21 0313  Weight: 100.7 kg    Examination:  General exam:  Appears calm and comfortable  Respiratory system: Clear to auscultation. Respiratory effort normal. Cardiovascular system: S1 & S2 +. No rubs, gallops or clicks.  Gastrointestinal system: Abdomen is nondistended, soft and nontender.  Normal bowel sounds heard. Central nervous system: Alert and oriented.Moves all extremities  Psychiatry: Judgement and insight appear normal. Flat mood and affect    Data Reviewed: I have personally reviewed following labs and imaging studies  CBC: Recent Labs  Lab 05/09/21 0316  WBC 7.6  HGB 14.9  HCT 42.2  MCV 91.1  PLT 123456   Basic Metabolic Panel: Recent Labs  Lab 05/09/21 0316  NA 133*  K 4.0  CL 95*  CO2 26  GLUCOSE 177*  BUN 21  CREATININE 0.93  CALCIUM 8.5*   GFR: Estimated Creatinine Clearance: 87.4 mL/min (by C-G formula based on SCr of 0.93 mg/dL). Liver Function Tests: Recent Labs  Lab 05/09/21 0316  AST 38  ALT 26  ALKPHOS 89  BILITOT 1.0  PROT 7.9  ALBUMIN 3.4*   No results for input(s): LIPASE, AMYLASE in the last 168 hours. No results for input(s): AMMONIA in the last 168 hours. Coagulation Profile: No results for input(s): INR, PROTIME in the last 168 hours. Cardiac Enzymes: No results for input(s): CKTOTAL, CKMB, CKMBINDEX, TROPONINI in the last  168 hours. BNP (last 3 results) No results for input(s): PROBNP in the last 8760 hours. HbA1C: No results for input(s): HGBA1C in the last 72 hours. CBG: Recent Labs  Lab 05/09/21 0746  GLUCAP 232*   Lipid Profile: No results for input(s): CHOL, HDL, LDLCALC, TRIG, CHOLHDL, LDLDIRECT in the last 72 hours. Thyroid Function Tests: No results for input(s): TSH, T4TOTAL, FREET4, T3FREE, THYROIDAB in the last 72 hours. Anemia Panel: Recent Labs    05/09/21 0618  FERRITIN 318   Sepsis Labs: Recent Labs  Lab 05/09/21 0327 05/09/21 0354 05/09/21 0618  PROCALCITON <0.10  --   --   LATICACIDVEN  --  1.9 1.3    Recent Results (from the past 240 hour(s))   Resp Panel by RT-PCR (Flu A&B, Covid) Nasopharyngeal Swab     Status: Abnormal   Collection Time: 05/09/21  3:28 AM   Specimen: Nasopharyngeal Swab; Nasopharyngeal(NP) swabs in vial transport medium  Result Value Ref Range Status   SARS Coronavirus 2 by RT PCR POSITIVE (A) NEGATIVE Final    Comment: RESULT CALLED TO, READ BACK BY AND VERIFIED WITH: [name] ALLRED AT 0501 05/09/21.PMF (NOTE) SARS-CoV-2 target nucleic acids are DETECTED.  The SARS-CoV-2 RNA is generally detectable in upper respiratory specimens during the acute phase of infection. Positive results are indicative of the presence of the identified virus, but do not rule out bacterial infection or co-infection with other pathogens not detected by the test. Clinical correlation with patient history and other diagnostic information is necessary to determine patient infection status. The expected result is Negative.  Fact Sheet for Patients: EntrepreneurPulse.com.au  Fact Sheet for Healthcare Providers: IncredibleEmployment.be  This test is not yet approved or cleared by the [place] FDA and  has been authorized for detection and/or diagnosis of SARS-CoV-2 by FDA under an Emergency Use Authorization (EUA).  This EUA will remain in effect (meaning this test can be  used) for the duration of  the COVID-19 declaration under Section 564(b)(1) of the Act, 21 U.S.C. section 360bbb-3(b)(1), unless the authorization is terminated or revoked sooner.     Influenza A by PCR NEGATIVE NEGATIVE Final   Influenza B by PCR NEGATIVE NEGATIVE Final    Comment: (NOTE) The Xpert Xpress SARS-CoV-2/FLU/RSV plus assay is intended as an aid in the diagnosis of influenza from Nasopharyngeal swab specimens and should not be used as a sole basis for treatment. Nasal washings and aspirates are unacceptable for Xpert Xpress SARS-CoV-2/FLU/RSV testing.  Fact Sheet for  Patients: EntrepreneurPulse.com.au  Fact Sheet for Healthcare Providers: IncredibleEmployment.be  This test is not yet approved or cleared by the [place] FDA and has been authorized for detection and/or diagnosis of SARS-CoV-2 by FDA under an Emergency Use Authorization (EUA). This EUA will remain in effect (meaning this test can be used) for the duration of the COVID-19 declaration under Section 564(b)(1) of the Act, 21 U.S.C. section 360bbb-3(b)(1), unless the authorization is terminated or revoked.  Performed at [facility], [address] [place], [place] [address]   Blood culture (routine x 2)     Status: None (Preliminary result)   Collection Time: 05/09/21  3:29 AM   Specimen: BLOOD  Result Value Ref Range Status   Specimen Description BLOOD LEFT ANTECUBITAL  Final   Special Requests   Final    BOTTLES DRAWN AEROBIC AND ANAEROBIC Blood Culture adequate volume   Culture   Final    NO GROWTH < 12 HOURS Performed at [facility], [place]  [address] [place], [place] [address]    Report Status PENDING  Incomplete  Blood culture (routine x 2)     Status: None (Preliminary result)   Collection Time: 05/09/21  3:54 AM   Specimen: BLOOD  Result Value Ref Range Status   Specimen Description BLOOD RIGHT FA  Final   Special Requests   Final    BOTTLES DRAWN AEROBIC AND ANAEROBIC Blood Culture adequate volume   Culture   Final    NO GROWTH <12 HOURS Performed at [facility], [address], [place], [place] [address]    Report Status PENDING  Incomplete         Radiology Studies: DG Chest Portable 1 View  Result Date: 05/09/2021 CLINICAL DATA:  Respiratory distress.  Positive COVID-19. EXAM: PORTABLE CHEST 1 VIEW COMPARISON:  Chest CT dated 10/20/2016. FINDINGS: Shallow inspiration. Bilateral patchy and streaky densities, likely sequela of prior inflammatory/infectious etiology including COVID-19.  Clinical correlation is recommended. No pleural effusion pneumothorax. The cardiac silhouette is within normal limits. No acute osseous pathology. IMPRESSION: Bilateral patchy and streaky densities, likely sequela of prior inflammatory/infectious etiology. Clinical correlation is recommended. Electronically Signed   By: Anner Crete M.D.   On: 05/09/2021 03:35        Scheduled Meds:  vitamin C  1,000 mg Oral Daily   aspirin EC  81 mg Oral Daily   cholecalciferol  1,000 Units Oral Daily   enoxaparin (LOVENOX) injection  0.5 mg/kg Subcutaneous Q24H   glimepiride  4 mg Oral Daily   guaiFENesin  600 mg Oral BID   insulin aspart  0-15 Units Subcutaneous TID AC & HS   insulin glargine-yfgn  40 Units Subcutaneous Daily   losartan  50 mg Oral Daily   methylPREDNISolone (SOLU-MEDROL) injection  1 mg/kg Intravenous Q12H   Followed by   Derrill Memo ON 05/12/2021] predniSONE  50 mg Oral Daily   pantoprazole  40 mg Oral q AM   pioglitazone  30 mg Oral Q breakfast   rosuvastatin  5 mg Oral q1800   tamsulosin  0.4 mg Oral Daily   zinc sulfate  220 mg Oral Daily   Continuous Infusions:  [START ON 05/10/2021] remdesivir 100 mg in NS 100 mL       LOS: 0 days    Time spent: 33 mins     Wyvonnia Dusky, MD [facility] Pager [phone]  If 7PM-7AM, please contact night-coverage 05/09/2021, 8:25 AM

## 2021-05-09 NOTE — ED Triage Notes (Addendum)
Pt in resp distress, is covid positive, pt placed on oxygen- 6 liters, pox on ra 66%. Pt with cyanotic lips, not able to speak in full sentences. Pt states tested positive for covid this am.

## 2021-05-09 NOTE — Progress Notes (Signed)
PHARMACY - PHYSICIAN COMMUNICATION CRITICAL VALUE ALERT - BLOOD CULTURE IDENTIFICATION (BCID)  Dylan Pittman is an 70 y.o. male who presented to [facility] on 05/09/2021 with a chief complaint of SOB  Assessment:  1/4 (anaerobic) MSSE    Name of physician (or Provider) Contacted: Sharion Settler, NP  Current antibiotics: n/a -- Remdesivir for COVID  Changes to prescribed antibiotics recommended: suspect contaminant. Recommend withholding antibiotics and repeating cultures.  Recommendations accepted by provider  Results for orders placed or performed during the hospital encounter of 05/09/21  Blood Culture ID Panel (Reflexed) (Collected: 05/09/2021  3:29 AM)  Result Value Ref Range   Enterococcus faecalis NOT DETECTED NOT DETECTED   Enterococcus Faecium NOT DETECTED NOT DETECTED   Listeria monocytogenes NOT DETECTED NOT DETECTED   Staphylococcus species DETECTED (A) NOT DETECTED   Staphylococcus aureus (BCID) NOT DETECTED NOT DETECTED   Staphylococcus epidermidis DETECTED (A) NOT DETECTED   Staphylococcus lugdunensis NOT DETECTED NOT DETECTED   Streptococcus species NOT DETECTED NOT DETECTED   Streptococcus agalactiae NOT DETECTED NOT DETECTED   Streptococcus pneumoniae NOT DETECTED NOT DETECTED   Streptococcus pyogenes NOT DETECTED NOT DETECTED   A.calcoaceticus-baumannii NOT DETECTED NOT DETECTED   Bacteroides fragilis NOT DETECTED NOT DETECTED   Enterobacterales NOT DETECTED NOT DETECTED   Enterobacter cloacae complex NOT DETECTED NOT DETECTED   Escherichia coli NOT DETECTED NOT DETECTED   Klebsiella aerogenes NOT DETECTED NOT DETECTED   Klebsiella oxytoca NOT DETECTED NOT DETECTED   Klebsiella pneumoniae NOT DETECTED NOT DETECTED   Proteus species NOT DETECTED NOT DETECTED   Salmonella species NOT DETECTED NOT DETECTED   Serratia marcescens NOT DETECTED NOT DETECTED   Haemophilus influenzae NOT DETECTED NOT DETECTED   Neisseria meningitidis NOT DETECTED NOT DETECTED    Pseudomonas aeruginosa NOT DETECTED NOT DETECTED   Stenotrophomonas maltophilia NOT DETECTED NOT DETECTED   Candida albicans NOT DETECTED NOT DETECTED   Candida auris NOT DETECTED NOT DETECTED   Candida glabrata NOT DETECTED NOT DETECTED   Candida krusei NOT DETECTED NOT DETECTED   Candida parapsilosis NOT DETECTED NOT DETECTED   Candida tropicalis NOT DETECTED NOT DETECTED   Cryptococcus neoformans/gattii NOT DETECTED NOT DETECTED   Methicillin resistance mecA/C NOT DETECTED NOT DETECTED    Dorothe Pea, PharmD, BCPS Clinical Pharmacist   05/09/2021  11:00 PM

## 2021-05-09 NOTE — Progress Notes (Signed)
Inpatient Diabetes Program Recommendations  AACE/ADA: New Consensus Statement on Inpatient Glycemic Control (2015)  Target Ranges:  Prepandial:   less than 140 mg/dL      Peak postprandial:   less than 180 mg/dL (1-2 hours)      Critically ill patients:  140 - 180 mg/dL   Lab Results  Component Value Date   GLUCAP 360 (H) 05/09/2021   HGBA1C 8.6 (H) 05/09/2021    Review of Glycemic Control Results for Dylan Pittman, Dylan Pittman (MRN [id number]) as of 05/09/2021 13:37  Ref. Range 05/09/2021 07:46 05/09/2021 11:52  Glucose-Capillary Latest Ref Range: 70 - 99 mg/dL 232 (H) 360 (H)   Diabetes history: DM 2 Outpatient Diabetes medications: Amaryl 4 mg daily, Ozempic 1 mg weekly, Actos 30 mg daily, Toujeo 40 units daily, Xigduo XR 12-998 mg daily   Current orders for Inpatient glycemic control:  Semglee 40 units daily, Novolog 0-15 units tid with meals and HS, Solumedrol 100.625 mg q 12 hours  Inpatient Diabetes Program Recommendations:    Please consider adding Novolog meal coverage 6 units tid with meals (hold if patient eats less than 50% or NPO).    Thanks,  Adah Perl, RN, BC-ADM Inpatient Diabetes Coordinator Pager [phone] (8a-5p)

## 2021-05-09 NOTE — H&P (Signed)
[place]   PATIENT NAME: Dylan Pittman    MR#:  [id number]  DATE OF BIRTH:  [birth year]  DATE OF ADMISSION:  05/09/2021  PRIMARY CARE PHYSICIAN: Sofie Hartigan, MD   Patient is coming from: Home  REQUESTING/REFERRING PHYSICIAN: Rudene Re, MD  CHIEF COMPLAINT:   Chief Complaint  Patient presents with  . Respiratory Distress    HISTORY OF PRESENT ILLNESS:  Dylan Pittman is a 70 y.o. Caucasian male with medical history significant for COPD, dyslipidemia, hypertension, type 2 diabetes mellitus, GERD and degenerative disc disease and prostate cancer status post radioactive seed implantation and brachytherapy a month ago, who presents to the emergency room with acute onset of worsening dyspnea with associated cough productive of clear sputum as well as wheezing over the last week.  The patient and his wife both tested positive for COVID-19 by home test yesterday morning.  He denies any fever or chills.  No nausea or vomiting or diarrhea.  The patient has not been vaccinated for COVID-19.  He has been having dyspnea with mild exertion.  No chest pain or palpitations.  No dysuria, oliguria or hematuria or flank pain.  No headache or dizziness or blurred vision or altered mental status.  ED Course: When the patient came to the ER temperature was 100.1 and respiratory rate was 38 and pulse oximetry was as above mentioned 66% and on 5 L of O2 by nasal cannula it has been in the mid to low 90s. EKG as reviewed by me : showed sinus tachycardia with a rate of 110 with left axis deviation Imaging: Chest x-ray showed bilateral patchy and streaky densities likely secondary of prior inflammatory/infectious etiology.  The patient was given 2 DuoNebs, 125 mg IV Solu-Medrol, IV remdesivir, 1 g of p.o. Tylenol and 1 L bolus of IV lactated Ringer.  The patient will be admitted to medical monitored bed for further evaluation and management. PAST MEDICAL HISTORY:   Past Medical History:   Diagnosis Date  . Chronic airway obstruction ([place])   . DDD (degenerative disc disease), lumbar   . Dupuytren's disease   . Dyslipidemia 04/07/2014  . Esophageal reflux 04/20/2014  . Hypertension 04/20/2014  . Microalbuminuria 04/07/2014  . Microalbuminuria   . Obesity, unspecified 04/07/2014  . Sleep apnea 04/20/2014  . 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 BACK SURGERY  1992, 2005  . CLOSED REDUCTION FINGER WITH PERCUTANEOUS PINNING Left 02/12/2020   Procedure: Closed reduction and pinning of left first metacarpal fracture with possible open reduction;  Surgeon: Hessie Knows, MD;  Location: [facility] ORS;  Service: Orthopedics;  Laterality: Left;  . COLONOSCOPY WITH PROPOFOL N/A 12/28/2017   Procedure: COLONOSCOPY WITH PROPOFOL;  Surgeon: Manya Silvas, MD;  Location: [facility] ENDOSCOPY;  Service: Endoscopy;  Laterality: N/A;  . NASAL SEPTUM SURGERY    . RADIOACTIVE SEED IMPLANT N/A 04/12/2021   Procedure: RADIOACTIVE SEED IMPLANT/BRACHYTHERAPY IMPLANT;  Surgeon: Dylan Sons, MD;  Location: [facility] ORS;  Service: Urology;  Laterality: N/A;  73 seeds implanted  . VASECTOMY  2005    SOCIAL HISTORY:   Social History   Tobacco Use  . Smoking status: Former  . Smokeless tobacco: Never  Substance Use Topics  . Alcohol use: Not Currently    FAMILY HISTORY:   Family History  Problem Relation Age of Onset  . Prostate cancer Neg Hx   .  Kidney cancer Neg Hx     DRUG ALLERGIES:  No Known Allergies  REVIEW OF SYSTEMS:   ROS As per history of present illness. All pertinent systems were reviewed above. Constitutional, HEENT, cardiovascular, respiratory, GI, GU, musculoskeletal, neuro, psychiatric, endocrine, integumentary and hematologic systems were reviewed and are otherwise negative/unremarkable except for positive findings mentioned above in the HPI.   MEDICATIONS AT  HOME:   Prior to Admission medications   Medication Sig Start Date End Date Taking? Authorizing Provider  albuterol (VENTOLIN HFA) 108 (90 Base) MCG/ACT inhaler Inhale 2 puffs into the lungs every 6 (six) hours as needed for wheezing or shortness of breath. 01/20/20  Yes [provider]  aspirin EC 81 MG tablet Take 81 mg by mouth in the morning. Swallow whole.   Yes [provider]  cholecalciferol (VITAMIN D) 25 MCG (1000 UNIT) tablet Take 1,000 Units by mouth in the morning.   Yes [provider]  glimepiride (AMARYL) 4 MG tablet Take 4 mg by mouth daily. 03/31/21  Yes [provider]  losartan (COZAAR) 50 MG tablet Take 50 mg by mouth in the morning. 10/07/17  Yes [provider]  OZEMPIC, 1 MG/DOSE, 4 MG/3ML SOPN Inject 1 mg into the skin every Sunday. 07/29/19  Yes [provider]  pioglitazone (ACTOS) 30 MG tablet Take 30 mg by mouth in the morning. 07/27/19  Yes [provider]  rosuvastatin (CRESTOR) 5 MG tablet Take 5 mg by mouth every Monday, Wednesday, and Friday. In the morning 04/19/18  Yes [provider]  tamsulosin (FLOMAX) 0.4 MG CAPS capsule Take 1 capsule (0.4 mg total) by mouth daily. 03/25/21  Yes Vaillancourt, Samantha, PA-C  TOUJEO MAX SOLOSTAR 300 UNIT/ML Solostar Pen Inject 40 Units into the skin in the morning. 12/29/20  Yes [provider]  XIGDUO XR 12-998 MG TB24 Take 2 tablets by mouth every morning. 01/31/21  Yes [provider]  GVOKE PFS 1 MG/0.2ML SOSY Inject into the skin. 05/03/21   [provider]  pantoprazole (PROTONIX) 40 MG tablet Take 40 mg by mouth in the morning. 10/19/17   [provider]      VITAL SIGNS:  Blood pressure 137/73, pulse (!) 117, temperature 100.1 F (37.8 C), resp. rate 20, height '5\' 8"'$  (1.727 m), weight 100.7 kg, SpO2 (!) 86 %.  PHYSICAL EXAMINATION:  Physical Exam  GENERAL:  70 y.o.-year-old Caucasian male patient lying in the bed  with mild respiratory distress with conversational dyspnea. EYES: Pupils equal, round, reactive to light and accommodation. No scleral icterus. Extraocular muscles intact.  HEENT: Head atraumatic, normocephalic. Oropharynx and nasopharynx clear.  NECK:  Supple, no jugular venous distention. No thyroid enlargement, no tenderness.  LUNGS: Diminished bibasilar breath sounds with bibasal and midlung zone crackles.Marland Kitchen  CARDIOVASCULAR: Regular rate and rhythm, S1, S2 normal. No murmurs, rubs, or gallops.  ABDOMEN: Soft, nondistended, nontender. Bowel sounds present. No organomegaly or mass.  EXTREMITIES: No pedal edema, cyanosis, or clubbing.  NEUROLOGIC: Cranial nerves II through XII are intact. Muscle strength 5/5 in all extremities. Sensation intact. Gait not checked.  PSYCHIATRIC: The patient is alert and oriented x 3.  Normal affect and good eye contact. SKIN: No obvious rash, lesion, or ulcer.   LABORATORY PANEL:   CBC Recent Labs  Lab 05/09/21 0316  WBC 7.6  HGB 14.9  HCT 42.2  PLT 203   ------------------------------------------------------------------------------------------------------------------  Chemistries  Recent Labs  Lab 05/09/21 0316  NA 133*  K 4.0  CL 95*  CO2 26  GLUCOSE 177*  BUN 21  CREATININE 0.93  CALCIUM 8.5*  AST 38  ALT 26  ALKPHOS 89  BILITOT 1.0   ------------------------------------------------------------------------------------------------------------------  Cardiac Enzymes No results for input(s): TROPONINI in the last 168 hours. ------------------------------------------------------------------------------------------------------------------  RADIOLOGY:  DG Chest Portable 1 View  Result Date: 05/09/2021 CLINICAL DATA:  Respiratory distress.  Positive COVID-19. EXAM: PORTABLE CHEST 1 VIEW COMPARISON:  Chest CT dated 10/20/2016. FINDINGS: Shallow inspiration. Bilateral patchy and streaky densities, likely sequela of prior  inflammatory/infectious etiology including COVID-19. Clinical correlation is recommended. No pleural effusion pneumothorax. The cardiac silhouette is within normal limits. No acute osseous pathology. IMPRESSION: Bilateral patchy and streaky densities, likely sequela of prior inflammatory/infectious etiology. Clinical correlation is recommended. Electronically Signed   By: Anner Crete M.D.   On: 05/09/2021 03:35      IMPRESSION AND PLAN:  Active Problems:   Acute hypoxemic respiratory failure due to COVID-19 ([place])  1.  Acute hypoxemic respiratory failure secondary to COVID-19. -The patient will be admitted to a medically monitored isolation bed. -O2 protocol will be followed to keep O2 saturation above 93.   2.  Multifocal pneumonia secondary to COVID-19. -The patient will be admitted to an isolation monitored bed with droplet and contact precautions. - No antibiotics at this time will be given due to low procalcitonin. -The patient will be placed on scheduled Mucinex and as needed Tussionex. -We will avoid nebulization as much as we can, give bronchodilator MDI if needed, and with deterioration of oxygenation try to avoid BiPAP/CPAP if possible.    -Will obtain sputum Gram stain culture and sensitivity and follow blood cultures. -O2 protocol will be followed. -We will follow CRP, ferritin, LDH and D-dimer. -Will follow daily CBC with manual differential and CMP. - Will place the patient on IV Remdesivir and IV steroid therapy with IV Solu-Medrol with elevated inflammatory markers. -The patient will be placed on vitamin D3, vitamin C, zinc sulfate, p.o. Pepcid and aspirin. -If he has deteriorating oxygenation he will be a candidate for Actemra.  3.  Type 2 diabetes mellitus. - The patient will be placed on supplement coverage with NovoLog and continue basal coverage`. - We will continue Amaryl, Xigduo XR and Actos..  4.  Dyslipidemia. - We will continue statin therapy.  5.   BPH. - We will continue Flomax.  6.  History of prostate cancer status post brachytherapy.  DVT prophylaxis: Lovenox. Code Status: full code. Family Communication:  The plan of care was discussed in details with the patient (and family). I answered all questions. The patient agreed to proceed with the above mentioned plan. Further management will depend upon hospital course. Disposition Plan: Back to previous home environment Consults called: none. All the records are reviewed and case discussed with ED provider.  Status is: Inpatient  Remains inpatient appropriate because:Ongoing diagnostic testing needed not appropriate for outpatient work up, Unsafe d/c plan, IV treatments appropriate due to intensity of illness or inability to take PO, and Inpatient level of care appropriate due to severity of illness  Dispo: The patient is from: Home              Anticipated d/c is to: Home              Patient currently is not medically stable to d/c.   Difficult to place patient No   TOTAL TIME TAKING CARE OF THIS PATIENT: 55 minutes.    Christel Mormon M.D on 05/09/2021 at 5:35 AM  [facility]   From 7 PM-7 AM, contact night-coverage [email or web address]  CC: Primary care physician; Sofie Hartigan, MD

## 2021-05-09 NOTE — ED Notes (Signed)
Pt to room 12, katie, rn notified of chief complaint and vital signs.

## 2021-05-09 NOTE — ED Provider Notes (Signed)
[facility] Emergency Department Provider Note  ____________________________________________  Time seen: Approximately 4:07 AM  I have reviewed the triage vital signs and the nursing notes.   HISTORY  Chief Complaint Respiratory Distress   HPI Dylan Pittman is a 70 y.o. male with a history of COPD, sleep apnea, diabetes, hypertension, prostate cancer status post radioactive seed implantation and brachytherapy 1 month ago who presents for evaluation of shortness of breath.  Patient reports that his wife tested positive for COVID a few days ago.  Today he started having fever, body aches, cough productive of clear phlegm and shortness of breath.  He took a home test for COVID which was positive as well.  Patient is unvaccinated.  He is complaining of constant shortness of breath that is worse with minimal exertion.  Patient brought in by private vehicle by his wife.  Upon arrival to the ED patient satting 66% on room air.  He denies chest pain, vomiting or diarrhea.  No prior history of PE or DVT, no leg pain or swelling, no hemoptysis.   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[place]) 01/08/2014    Patient Active Problem List   Diagnosis Date Noted   Prostate cancer ([place]) 01/24/2021   Benign prostatic hyperplasia with lower urinary tract symptoms 07/31/2019   Elevated PSA 06/18/2018   Vitamin D deficiency 01/18/2018   Esophageal reflux 04/20/2014   Hypertension 04/20/2014   Sleep apnea 04/20/2014   Dyslipidemia 04/07/2014   Microalbuminuria 04/07/2014   Obesity, unspecified 04/07/2014   Hyperlipidemia due to  type 2 diabetes mellitus ([place]) 04/07/2014   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 ([place]) 01/08/2014   Type 2 diabetes mellitus with diabetic polyneuropathy, with long-term current use of insulin ([place]) 01/08/2014    Past Surgical History:  Procedure Laterality Date   [place], 2005   CLOSED REDUCTION FINGER WITH PERCUTANEOUS PINNING Left 02/12/2020   Procedure: Closed reduction and pinning of left first metacarpal fracture with possible open reduction;  Surgeon: Hessie Knows, MD;  Location: [facility] ORS;  Service: Orthopedics;  Laterality: Left;   COLONOSCOPY WITH PROPOFOL N/A 12/28/2017   Procedure: COLONOSCOPY WITH PROPOFOL;  Surgeon: Manya Silvas, MD;  Location: [facility] ENDOSCOPY;  Service: Endoscopy;  Laterality: N/A;   NASAL SEPTUM SURGERY     RADIOACTIVE SEED IMPLANT N/A 04/12/2021   Procedure: RADIOACTIVE SEED IMPLANT/BRACHYTHERAPY IMPLANT;  Surgeon: Abbie Sons, MD;  Location: [facility] ORS;  Service: Urology;  Laterality: N/A;  73 seeds implanted   VASECTOMY  2005    Prior to Admission medications   Medication Sig Start Date End Date Taking? Authorizing Provider  albuterol (VENTOLIN HFA) 108 (90 Base) MCG/ACT inhaler Inhale 2 puffs into the lungs every 6 (six) hours as needed for wheezing or shortness of breath. 01/20/20  Yes [provider]  aspirin EC 81 MG tablet Take 81 mg by mouth in the morning. Swallow whole.   Yes [provider]  cholecalciferol (VITAMIN D) 25 MCG (1000 UNIT) tablet Take 1,000 Units by mouth in the morning.   Yes [provider]  glimepiride (AMARYL) 4 MG tablet Take 4 mg  by mouth daily. 03/31/21  Yes [provider]  losartan (COZAAR) 50 MG tablet Take 50 mg by mouth in the morning. 10/07/17  Yes [provider]  OZEMPIC, 1 MG/DOSE, 4 MG/3ML SOPN Inject 1 mg into the skin every Sunday. 07/29/19  Yes [provider]  pioglitazone (ACTOS) 30 MG tablet Take 30 mg by mouth in the morning.  07/27/19  Yes [provider]  rosuvastatin (CRESTOR) 5 MG tablet Take 5 mg by mouth every Monday, Wednesday, and Friday. In the morning 04/19/18  Yes [provider]  tamsulosin (FLOMAX) 0.4 MG CAPS capsule Take 1 capsule (0.4 mg total) by mouth daily. 03/25/21  Yes Vaillancourt, Samantha, PA-C  TOUJEO MAX SOLOSTAR 300 UNIT/ML Solostar Pen Inject 40 Units into the skin in the morning. 12/29/20  Yes [provider]  XIGDUO XR 12-998 MG TB24 Take 2 tablets by mouth every morning. 01/31/21  Yes [provider]  GVOKE PFS 1 MG/0.2ML SOSY Inject into the skin. 05/03/21   [provider]  pantoprazole (PROTONIX) 40 MG tablet Take 40 mg by mouth in the morning. 10/19/17   [provider]    Allergies Patient has no known allergies.  Family History  Problem Relation Age of Onset   Prostate cancer Neg Hx    Kidney cancer Neg Hx     Social History Social History   Tobacco Use   Smoking status: Former   Smokeless tobacco: Never  Scientific laboratory technician Use: Never used  Substance Use Topics   Alcohol use: Not Currently   Drug use: Not Currently    Review of Systems  Constitutional: + fever. Eyes: Negative for visual changes. ENT: Negative for sore throat. Neck: No neck pain  Cardiovascular: Negative for chest pain. Respiratory: + shortness of breath, cough Gastrointestinal: Negative for abdominal pain, vomiting or diarrhea. Genitourinary: Negative for dysuria. Musculoskeletal: Negative for back pain. Skin: Negative for rash. Neurological: Negative for headaches, weakness or numbness. Psych: No SI or HI  ____________________________________________   PHYSICAL EXAM:  VITAL SIGNS: ED Triage Vitals  Enc Vitals Group     BP 05/09/21 0312 123/71     Pulse Rate 05/09/21 0312 (!) 115     Resp 05/09/21 0312 (!) 38     Temp 05/09/21 0312 100.1 F (37.8 C)     Temp src --      SpO2 05/09/21 0312 (!) 66 %     Weight 05/09/21 0313 222 lb  (100.7 kg)     Height 05/09/21 0313 '5\' 8"'$  (1.727 m)     Head Circumference --      Peak Flow --      Pain Score --      Pain Loc --      Pain Edu? --      Excl. in [place]? --     Constitutional: Alert and oriented. Well appearing and in no apparent distress. HEENT:      Head: Normocephalic and atraumatic.         Eyes: Conjunctivae are normal. Sclera is non-icteric.       Mouth/Throat: Mucous membranes are moist.       Neck: Supple with no signs of meningismus. Cardiovascular: Regular rate and rhythm. No murmurs, gallops, or rubs. 2+ symmetrical distal pulses are present in all extremities. No JVD. Respiratory: Increased work of breathing, tachypneic, hypoxic on room air with crackles bilaterally Gastrointestinal: Soft, non tender, and non distended with positive bowel sounds. No rebound or guarding. Genitourinary:  No CVA tenderness. Musculoskeletal:  No edema, cyanosis, or erythema of extremities. Neurologic: Normal speech and language. Face is symmetric. Moving all extremities. No gross focal neurologic deficits are appreciated. Skin: Skin is warm, dry and intact. No rash noted. Psychiatric: Mood and affect are normal. Speech and behavior are normal.  ____________________________________________   LABS (all labs ordered are listed, but only abnormal results are displayed)  Labs Reviewed  COMPREHENSIVE METABOLIC PANEL - Abnormal; Notable for the following components:      Result Value   Sodium 133 (*)    Chloride 95 (*)    Glucose, Bld 177 (*)    Calcium 8.5 (*)    Albumin 3.4 (*)    All other components within normal limits  RESP PANEL BY RT-PCR (FLU A&B, COVID) ARPGX2  CULTURE, BLOOD (ROUTINE X 2)  CULTURE, BLOOD (ROUTINE X 2)  CBC  PROCALCITONIN  LACTIC ACID, PLASMA  LACTIC ACID, PLASMA  TROPONIN I (HIGH SENSITIVITY)  TROPONIN I (HIGH SENSITIVITY)   ____________________________________________  EKG  ED ECG REPORT I, Rudene Re, the attending physician,  personally viewed and interpreted this ECG.  Sinus tachycardia, rate of 110, left axis deviation, no ST elevations or depressions ____________________________________________  RADIOLOGY  I have personally reviewed the images performed during this visit and I agree with the Radiologist's read.   Interpretation by Radiologist:  DG Chest Portable 1 View  Result Date: 05/09/2021 CLINICAL DATA:  Respiratory distress.  Positive COVID-19. EXAM: PORTABLE CHEST 1 VIEW COMPARISON:  Chest CT dated 10/20/2016. FINDINGS: Shallow inspiration. Bilateral patchy and streaky densities, likely sequela of prior inflammatory/infectious etiology including COVID-19. Clinical correlation is recommended. No pleural effusion pneumothorax. The cardiac silhouette is within normal limits. No acute osseous pathology. IMPRESSION: Bilateral patchy and streaky densities, likely sequela of prior inflammatory/infectious etiology. Clinical correlation is recommended. Electronically Signed   By: Anner Crete M.D.   On: 05/09/2021 03:35     ____________________________________________   PROCEDURES  Procedure(s) performed:yes .1-3 Lead EKG Interpretation Performed by: Rudene Re, MD Authorized by: Rudene Re, MD     Interpretation: non-specific     ECG rate assessment: tachycardic     Rhythm: sinus tachycardia     Ectopy: none     Conduction: abnormal    Critical Care performed: yes  CRITICAL CARE Performed by: Rudene Re  ?  Total critical care time: 30 min  Critical care time was exclusive of separately billable procedures and treating other patients.  Critical care was necessary to treat or prevent imminent or life-threatening deterioration.  Critical care was time spent personally by me on the following activities: development of treatment plan with patient and/or surrogate as well as nursing, discussions with consultants, evaluation of patient's response to treatment, examination  of patient, obtaining history from patient or surrogate, ordering and performing treatments and interventions, ordering and review of laboratory studies, ordering and review of radiographic studies, pulse oximetry and re-evaluation of patient's condition.  ____________________________________________   INITIAL IMPRESSION / ASSESSMENT AND PLAN / ED COURSE  70 y.o. male with a history of COPD, sleep apnea, diabetes, hypertension, prostate cancer status post radioactive seed implantation and brachytherapy 1 month ago who presents for evaluation of shortness of breath, cough and fever.  Wife tested positive for COVID at home and saw the patient today.  He is unvaccinated.  Patient satting 66% which improved on 5 L nasal cannula, has a fever of 100.106F, tachycardic, tachypneic, with crackles bilaterally.  Chest x-ray concerning for COVID-pneumonia.  Patient with a  positive home test.  Will confirm with the PCR test.  We will start patient on Solu-Medrol, duo nebs, remdesivir.  Procalcitonin pending and if that is elevated will cover with antibiotics as well.  We will give Tylenol for fever.  Will give IV fluids for tachycardia.  Anticipate admission to the hospital.  Old medical records reviewed.  _________________________ 4:50 AM on 05/09/2021 -----------------------------------------  Procalcitonin is negative therefore we will hold off antibiotics.  Blood work with no acute findings.  Patient remains on 5 L nasal cannula satting in the mid to low 90s.  Reports that he feels improved after DuoNeb's and Solu-Medrol.  Will discuss with hospitalist for admission      _____________________________________________ Please note:  Patient was evaluated in Emergency Department today for the symptoms described in the history of present illness. Patient was evaluated in the context of the global COVID-19 pandemic, which necessitated consideration that the patient might be at risk for infection with the  SARS-CoV-2 virus that causes COVID-19. Institutional protocols and algorithms that pertain to the evaluation of patients at risk for COVID-19 are in a state of rapid change based on information released by regulatory bodies including the CDC and federal and state organizations. These policies and algorithms were followed during the patient's care in the ED.  Some ED evaluations and interventions may be delayed as a result of limited staffing during the pandemic.   [place] Controlled Substance Database was reviewed by me. ____________________________________________   FINAL CLINICAL IMPRESSION(S) / ED DIAGNOSES   Final diagnoses:  Acute respiratory failure with hypoxia ([place])  Pneumonia due to COVID-19 virus      NEW MEDICATIONS STARTED DURING THIS VISIT:  ED Discharge Orders     None        Note:  This document was prepared using Dragon voice recognition software and may include unintentional dictation errors.    Alfred Levins, [place], MD 05/09/21 [id number]

## 2021-05-09 NOTE — Progress Notes (Signed)
Remdesivir - Pharmacy Brief Note   O:  CXR: "Bilateral patchy and streaky densities, likely sequela of prior inflammatory/infectious etiology. Clinical correlation is recommended." SpO2: 66% on RA   A/P:  Remdesivir 200 mg IVPB once followed by 100 mg IVPB daily x 4 days.   Renda Rolls, PharmD, [facility] 05/09/2021 3:32 AM

## 2021-05-10 ENCOUNTER — Ambulatory Visit: Payer: BC Managed Care – PPO

## 2021-05-10 ENCOUNTER — Ambulatory Visit: Payer: BC Managed Care – PPO | Admitting: Radiation Oncology

## 2021-05-10 DIAGNOSIS — E1169 Type 2 diabetes mellitus with other specified complication: Secondary | ICD-10-CM

## 2021-05-10 LAB — COMPREHENSIVE METABOLIC PANEL
ALT: 20 U/L (ref 0–44)
AST: 23 U/L (ref 15–41)
Albumin: 2.9 g/dL — ABNORMAL LOW (ref 3.5–5.0)
Alkaline Phosphatase: 72 U/L (ref 38–126)
Anion gap: 9 (ref 5–15)
BUN: 20 mg/dL (ref 8–23)
CO2: 27 mmol/L (ref 22–32)
Calcium: 8.4 mg/dL — ABNORMAL LOW (ref 8.9–10.3)
Chloride: 100 mmol/L (ref 98–111)
Creatinine, Ser: 0.47 mg/dL — ABNORMAL LOW (ref 0.61–1.24)
GFR, Estimated: >60 mL/min (ref >60)
Glucose, Bld: 224 mg/dL — ABNORMAL HIGH (ref 70–99)
Potassium: 3.8 mmol/L (ref 3.5–5.1)
Sodium: 136 mmol/L (ref 135–145)
Total Bilirubin: 0.7 mg/dL (ref 0.3–1.2)
Total Protein: 6.7 g/dL (ref 6.5–8.1)

## 2021-05-10 LAB — CBC WITH DIFFERENTIAL/PLATELET
Abs Immature Granulocytes: 0.06 10*3/uL (ref 0.00–0.07)
Basophils Absolute: 0 10*3/uL (ref 0.0–0.1)
Basophils Relative: 0 %
Eosinophils Absolute: 0 10*3/uL (ref 0.0–0.5)
Eosinophils Relative: 0 %
HCT: 38.8 % — ABNORMAL LOW (ref 39.0–52.0)
Hemoglobin: 13.9 g/dL (ref 13.0–17.0)
Immature Granulocytes: 1 %
Lymphocytes Relative: 13 %
Lymphs Abs: 1 10*3/uL (ref 0.7–4.0)
MCH: 32.3 pg (ref 26.0–34.0)
MCHC: 35.8 g/dL (ref 30.0–36.0)
MCV: 90.2 fL (ref 80.0–100.0)
Monocytes Absolute: 0.7 10*3/uL (ref 0.1–1.0)
Monocytes Relative: 8 %
Neutro Abs: 6.4 10*3/uL (ref 1.7–7.7)
Neutrophils Relative %: 78 %
Platelets: 220 10*3/uL (ref 150–400)
RBC: 4.3 MIL/uL (ref 4.22–5.81)
RDW: 12.2 % (ref 11.5–15.5)
WBC: 8.1 10*3/uL (ref 4.0–10.5)
nRBC: 0 % (ref 0.0–0.2)

## 2021-05-10 LAB — GLUCOSE, CAPILLARY
Glucose-Capillary: 209 mg/dL — ABNORMAL HIGH (ref 70–99)
Glucose-Capillary: 339 mg/dL — ABNORMAL HIGH (ref 70–99)
Glucose-Capillary: 325 mg/dL — ABNORMAL HIGH (ref 70–99)
Glucose-Capillary: 402 mg/dL — ABNORMAL HIGH (ref 70–99)

## 2021-05-10 LAB — D-DIMER, QUANTITATIVE: D-Dimer, Quant: 0.67 ug/mL-FEU — ABNORMAL HIGH (ref 0.00–0.50)

## 2021-05-10 LAB — FERRITIN: Ferritin: 365 ng/mL — ABNORMAL HIGH (ref 24–336)

## 2021-05-10 LAB — C-REACTIVE PROTEIN: CRP: 10.9 mg/dL — ABNORMAL HIGH (ref <1.0)

## 2021-05-10 MED ORDER — INSULIN GLARGINE-YFGN 100 UNIT/ML ~~LOC~~ SOLN
50.0000 [IU] | Freq: Every day | SUBCUTANEOUS | Status: DC
  Administered 2021-05-11 – 2021-05-13 (×3): 50 [IU] via SUBCUTANEOUS
  Filled 2021-05-10 (×4): qty 0.5

## 2021-05-10 MED ORDER — GLUCERNA SHAKE PO LIQD
237.0000 mL | Freq: Two times a day (BID) | ORAL | Status: DC
  Administered 2021-05-10 – 2021-05-12 (×2): 237 mL via ORAL

## 2021-05-10 MED ORDER — ADULT MULTIVITAMIN W/MINERALS CH
1.0000 | ORAL_TABLET | Freq: Every day | ORAL | Status: DC
  Administered 2021-05-10 – 2021-05-13 (×4): 1 via ORAL
  Filled 2021-05-10 (×4): qty 1

## 2021-05-10 MED ORDER — INSULIN ASPART 100 UNIT/ML IJ SOLN
20.0000 [IU] | Freq: Once | INTRAMUSCULAR | Status: AC
  Administered 2021-05-10: 23:00:00 20 [IU] via SUBCUTANEOUS
  Filled 2021-05-10: qty 1

## 2021-05-10 MED ORDER — PROSOURCE PLUS PO LIQD
30.0000 mL | Freq: Three times a day (TID) | ORAL | Status: DC
  Administered 2021-05-10 – 2021-05-13 (×8): 30 mL via ORAL
  Filled 2021-05-10 (×11): qty 30

## 2021-05-10 MED ORDER — INSULIN ASPART 100 UNIT/ML IJ SOLN: 0.0000 [IU] | Freq: Three times a day (TID) | INTRAMUSCULAR | Status: DC

## 2021-05-10 MED ORDER — HYDROCOD POLST-CPM POLST ER 10-8 MG/5ML PO SUER
5.0000 mL | Freq: Two times a day (BID) | ORAL | Status: DC
Start: 2021-05-10 — End: 2021-05-13
  Administered 2021-05-10 – 2021-05-13 (×7): 5 mL via ORAL
  Filled 2021-05-10 (×7): qty 5

## 2021-05-10 NOTE — Progress Notes (Signed)
Inpatient Diabetes Program Recommendations  AACE/ADA: New Consensus Statement on Inpatient Glycemic Control (2015)  Target Ranges:  Prepandial:   less than 140 mg/dL      Peak postprandial:   less than 180 mg/dL (1-2 hours)      Critically ill patients:  140 - 180 mg/dL   Lab Results  Component Value Date   GLUCAP 209 (H) 05/10/2021   HGBA1C 8.6 (H) 05/09/2021    Review of Glycemic Control Results for Dylan Pittman, Dylan Pittman (MRN [id number]) as of 05/10/2021 10:29  Ref. Range 05/09/2021 07:46 05/09/2021 11:52 05/09/2021 17:22 05/09/2021 21:29 05/10/2021 07:57  Glucose-Capillary Latest Ref Range: 70 - 99 mg/dL 232 (H) 360 (H) 274 (H) 222 (H) 209 (H)  Diabetes history: DM 2 Outpatient Diabetes medications: Amaryl 4 mg daily, Ozempic 1 mg weekly, Actos 30 mg daily, Toujeo 40 units daily, Xigduo XR 12-998 mg daily   Current orders for Inpatient glycemic control:  Semglee 40 units daily, Novolog 0-15 units tid with meals and HS, Novolog 6 units tid with meals, Solumedrol 100.625 mg q 12 hours Inpatient Diabetes Program Recommendations:    Please consider increasing Semglee to 50 units daily.   Thanks,  Adah Perl, RN, BC-ADM Inpatient Diabetes Coordinator Pager [phone]  (8a-5p)

## 2021-05-10 NOTE — Progress Notes (Signed)
Initial Nutrition Assessment RD working remotely.  DOCUMENTATION CODES:   Obesity unspecified  INTERVENTION:  - will order Glucerna Shake BID, each supplement provides 220 kcal and 10 grams of protein. - will order 30 ml Prosource Plus TID, each supplement provides 100 kcal and 15 grams protein.    NUTRITION DIAGNOSIS:   Increased nutrient needs related to acute illness, cancer and cancer related treatments, catabolic illness ([id number] infection) as evidenced by estimated needs.  GOAL:   Patient will meet greater than or equal to 90% of their needs  MONITOR:   PO intake, Supplement acceptance, Labs, Weight trends  REASON FOR ASSESSMENT:   Malnutrition Screening Tool  ASSESSMENT:   70 y.o.  male with medical history of COPD, dyslipidemia, HTN, type 2 DM, GERD, degenerative disc disease, and prostate cancer s/p radioactive seed implantation and brachytherapy a month ago. He presented to the ED due to acute onset of worsening dyspnea, productive cough, and wheezing x1 week. He and his wife both tested positive for COVID-19 at home on 9/11 AM. No N/V/D PTA.  RD is working on another campus. Attempted to call patient's room x2 but did not get an answer either time.   No intakes documented since admission. He has not been seen by a [place] RD at any time in the past.   Weight yesterday was 222 lb, weight on 6/29 was 222 lb, and weight on 3/11 was 225.5 lb. No information documented in the edema section of flow sheet.   Per notes: - acute hypoxic respiratory failure d/t COVID-19 - COVID-19 PNA   Labs reviewed; HgbA1c: 8.6%, CBGs: 209 and 339 mg/dl, creatinine: 0.47 mg/dl, Ca: 8.4 mg/dl.  Medications reviewed; 1000 mg ascorbic acid/day, 1000 units cholecalciferol/day, sliding scale novolog, 50 units semglee/day, 100 mg solu-medrol BID, 40 mg oral protonix/day, 200 mg IV remdesivir x1 dose 9/12, 100 mg IV remdesivir x1 dose/day 9/13-9/16, 220 mg zinc sulfate/day.      NUTRITION - FOCUSED PHYSICAL EXAM:  Unable to complete.  Diet Order:   Diet Order             Diet heart healthy/carb modified Room service appropriate? Yes; Fluid consistency: Thin  Diet effective now                   EDUCATION NEEDS:   Not appropriate for education at this time  Skin:  Skin Assessment: Reviewed RN Assessment  Last BM:  9/12  Height:   Ht Readings from Last 1 Encounters:  05/09/21 '5\' 8"'$  (1.727 m)    Weight:   Wt Readings from Last 1 Encounters:  05/09/21 100.7 kg     Estimated Nutritional Needs:  Kcal:  2100-2300 kcal Protein:  120-135 grams Fluid:  >/= 2.5 L/day      Jarome Matin, MS, RD, LDN, CNSC Inpatient Clinical Dietitian RD pager # available in [facility]  After hours/weekend pager # available in [facility]

## 2021-05-10 NOTE — Progress Notes (Signed)
PROGRESS NOTE    HPI wa taken from D. Mansy: Dylan Pittman is a 70 y.o. Caucasian male with medical history significant for COPD, dyslipidemia, hypertension, type 2 diabetes mellitus, GERD and degenerative disc disease and prostate cancer status post radioactive seed implantation and brachytherapy a month ago, who presents to the emergency room with acute onset of worsening dyspnea with associated cough productive of clear sputum as well as wheezing over the last week.  The patient and his wife both tested positive for COVID-19 by home test yesterday morning.  He denies any fever or chills.  No nausea or vomiting or diarrhea.  The patient has not been vaccinated for COVID-19.  He has been having dyspnea with mild exertion.  No chest pain or palpitations.  No dysuria, oliguria or hematuria or flank pain.  No headache or dizziness or blurred vision or altered mental status.  ED Course: When the patient came to the ER temperature was 100.1 and respiratory rate was 38 and pulse oximetry was as above mentioned 66% and on 5 L of O2 by nasal cannula it has been in the mid to low 90s. EKG as reviewed by me : showed sinus tachycardia with a rate of 110 with left axis deviation Imaging: Chest x-ray showed bilateral patchy and streaky densities likely secondary of prior inflammatory/infectious etiology.  The patient was given 2 DuoNebs, 125 mg IV Solu-Medrol, IV remdesivir, 1 g of p.o. Tylenol and 1 L bolus of IV lactated Ringer.  The patient will be admitted to medical monitored bed for further evaluation and management   Dylan Pittman  [id number] DOB: 09-24-1950 DOA: 05/09/2021 PCP: Sofie Hartigan, MD      Assessment & Plan:   Active Problems:   Acute hypoxemic respiratory failure due to COVID-19 [facility])   Acute hypoxic respiratory failure: secondary to COVID-19. Continue on supplemental oxygen and wean as tolerated, currently on 4L [place].   COVID19 pneumonia: continue on IV remdesivir,  steroids, zinc, bronchodilators & encourage incentive spirometry. Continue on airborne and contact precautions. Continue on supplemental oxygen and wean as tolerated  DM2: likely poorly controlled. Continue on glargine, SSI w/ accuchecks. Hold all home oral anti-DM meds   HLD: continue on statin    BPH: continue on home dose of tamsulosin   Hx of prostate cancer: management per onco as an outpatient    DVT prophylaxis: lovenox Code Status: full Family Communication:  Disposition Plan: likely d/c back home   Level of care: Med-Surg  Status is: Inpatient  Remains inpatient appropriate because:IV treatments appropriate due to intensity of illness or inability to take PO and Inpatient level of care appropriate due to severity of illness, still requiring supplemental oxygen. At baseline, pt does not use oxygen at home   Dispo: The patient is from: Home              Anticipated d/c is to: Home              Patient currently is not medically stable to d/c.   Difficult to place patient : unclear    Consultants:    Procedures:   Antimicrobials:    Subjective: Pt c/o shortness of breath & cough  Objective: Vitals:   05/09/21 0900 05/09/21 1053 05/09/21 2127 05/10/21 0453  BP: 113/65 121/73 114/64 132/77  Pulse: 98 98 80 72  Resp: '20  16 16  '$ Temp: 98 F (36.7 C)  (!) 97.5 F (36.4 C) (!) 97.5 F (36.4 C)  TempSrc:  Oral     SpO2: 96% 98% 94% 94%  Weight:      Height:        Intake/Output Summary (Last 24 hours) at 05/10/2021 0745 Last data filed at 05/09/2021 0811 Gross per 24 hour  Intake --  Output 750 ml  Net -750 ml   Filed Weights   05/09/21 0313  Weight: 100.7 kg    Examination:  General exam: Appears comfortable   Respiratory system: diminished breath sounds b/l. Cardiovascular system: S1/S2+. No rubs or clicks  Gastrointestinal system: Abd is soft, NT, obese & hypoactive bowel sounds  Central nervous system: Alert and oriented. Moves all extremities   Psychiatry: Judgement and insight appears normal. Appropriate mood and affect     Data Reviewed: I have personally reviewed following labs and imaging studies  CBC: Recent Labs  Lab 05/09/21 0316 05/10/21 0514  WBC 7.6 8.1  NEUTROABS  --  6.4  HGB 14.9 13.9  HCT 42.2 38.8*  MCV 91.1 90.2  PLT 203 [id number]   Basic Metabolic Panel: Recent Labs  Lab 05/09/21 0316 05/10/21 0514  NA 133* 136  K 4.0 3.8  CL 95* 100  CO2 26 27  GLUCOSE 177* 224*  BUN 21 20  CREATININE 0.93 0.47*  CALCIUM 8.5* 8.4*   GFR: Estimated Creatinine Clearance: 101.6 mL/min (A) (by C-G formula based on SCr of 0.47 mg/dL (L)). Liver Function Tests: Recent Labs  Lab 05/09/21 0316 05/10/21 0514  AST 38 23  ALT 26 20  ALKPHOS 89 72  BILITOT 1.0 0.7  PROT 7.9 6.7  ALBUMIN 3.4* 2.9*   No results for input(s): LIPASE, AMYLASE in the last 168 hours. No results for input(s): AMMONIA in the last 168 hours. Coagulation Profile: No results for input(s): INR, PROTIME in the last 168 hours. Cardiac Enzymes: No results for input(s): CKTOTAL, CKMB, CKMBINDEX, TROPONINI in the last 168 hours. BNP (last 3 results) No results for input(s): PROBNP in the last 8760 hours. HbA1C: Recent Labs    05/09/21 0618  HGBA1C 8.6*   CBG: Recent Labs  Lab 05/09/21 0746 05/09/21 1152 05/09/21 1722 05/09/21 2129  GLUCAP 232* 360* 274* 222*   Lipid Profile: No results for input(s): CHOL, HDL, LDLCALC, TRIG, CHOLHDL, LDLDIRECT in the last 72 hours. Thyroid Function Tests: No results for input(s): TSH, T4TOTAL, FREET4, T3FREE, THYROIDAB in the last 72 hours. Anemia Panel: Recent Labs    05/09/21 0618 05/10/21 0514  FERRITIN 318 365*   Sepsis Labs: Recent Labs  Lab 05/09/21 0327 05/09/21 0354 05/09/21 0618  PROCALCITON <0.10  --   --   LATICACIDVEN  --  1.9 1.3    Recent Results (from the past 240 hour(s))  Resp Panel by RT-PCR (Flu A&B, Covid) Nasopharyngeal Swab     Status: Abnormal   Collection  Time: 05/09/21  3:28 AM   Specimen: Nasopharyngeal Swab; Nasopharyngeal(NP) swabs in vial transport medium  Result Value Ref Range Status   SARS Coronavirus 2 by RT PCR POSITIVE (A) NEGATIVE Final    Comment: RESULT CALLED TO, READ BACK BY AND VERIFIED WITH: [name] ALLRED AT 0501 05/09/21.PMF (NOTE) SARS-CoV-2 target nucleic acids are DETECTED.  The SARS-CoV-2 RNA is generally detectable in upper respiratory specimens during the acute phase of infection. Positive results are indicative of the presence of the identified virus, but do not rule out bacterial infection or co-infection with other pathogens not detected by the test. Clinical correlation with patient history and other diagnostic information is necessary to determine patient  infection status. The expected result is Negative.  Fact Sheet for Patients: EntrepreneurPulse.com.au  Fact Sheet for Healthcare Providers: IncredibleEmployment.be  This test is not yet approved or cleared by the [place] FDA and  has been authorized for detection and/or diagnosis of SARS-CoV-2 by FDA under an Emergency Use Authorization (EUA).  This EUA will remain in effect (meaning this test can be  used) for the duration of  the COVID-19 declaration under Section 564(b)(1) of the Act, 21 U.S.C. section 360bbb-3(b)(1), unless the authorization is terminated or revoked sooner.     Influenza A by PCR NEGATIVE NEGATIVE Final   Influenza B by PCR NEGATIVE NEGATIVE Final    Comment: (NOTE) The Xpert Xpress SARS-CoV-2/FLU/RSV plus assay is intended as an aid in the diagnosis of influenza from Nasopharyngeal swab specimens and should not be used as a sole basis for treatment. Nasal washings and aspirates are unacceptable for Xpert Xpress SARS-CoV-2/FLU/RSV testing.  Fact Sheet for Patients: EntrepreneurPulse.com.au  Fact Sheet for Healthcare  Providers: IncredibleEmployment.be  This test is not yet approved or cleared by the [place] FDA and has been authorized for detection and/or diagnosis of SARS-CoV-2 by FDA under an Emergency Use Authorization (EUA). This EUA will remain in effect (meaning this test can be used) for the duration of the COVID-19 declaration under Section 564(b)(1) of the Act, 21 U.S.C. section 360bbb-3(b)(1), unless the authorization is terminated or revoked.  Performed at [facility], [address] [place], [place] [address]   Blood culture (routine x 2)     Status: None (Preliminary result)   Collection Time: 05/09/21  3:29 AM   Specimen: BLOOD  Result Value Ref Range Status   Specimen Description BLOOD LEFT ANTECUBITAL  Final   Special Requests   Final    BOTTLES DRAWN AEROBIC AND ANAEROBIC Blood Culture adequate volume   Culture  Setup Time   Final    Organism ID to follow GRAM POSITIVE COCCI ANAEROBIC BOTTLE ONLY CRITICAL RESULT CALLED TO, READ BACK BY AND VERIFIED WITH: CARISSA DOLAN '@2235'$  ON 05/09/21 SKL Performed at [facility], [address], [place], [place] [address]    Culture GRAM POSITIVE COCCI  Final   Report Status PENDING  Incomplete  Blood Culture ID Panel (Reflexed)     Status: Abnormal   Collection Time: 05/09/21  3:29 AM  Result Value Ref Range Status   Enterococcus faecalis NOT DETECTED NOT DETECTED Final   Enterococcus Faecium NOT DETECTED NOT DETECTED Final   Listeria monocytogenes NOT DETECTED NOT DETECTED Final   Staphylococcus species DETECTED (A) NOT DETECTED Final    Comment: CRITICAL RESULT CALLED TO, READ BACK BY AND VERIFIED WITH: CARISSA DOLAN '@2235'$  ON 05/09/21 SKL`    Staphylococcus aureus (BCID) NOT DETECTED NOT DETECTED Final   Staphylococcus epidermidis DETECTED (A) NOT DETECTED Final    Comment: CRITICAL RESULT CALLED TO, READ BACK BY AND VERIFIED WITH: CARISSA DOLAN '@2235'$  ON 05/09/21 SKL    Staphylococcus  lugdunensis NOT DETECTED NOT DETECTED Final   Streptococcus species NOT DETECTED NOT DETECTED Final   Streptococcus agalactiae NOT DETECTED NOT DETECTED Final   Streptococcus pneumoniae NOT DETECTED NOT DETECTED Final   Streptococcus pyogenes NOT DETECTED NOT DETECTED Final   A.calcoaceticus-baumannii NOT DETECTED NOT DETECTED Final   Bacteroides fragilis NOT DETECTED NOT DETECTED Final   Enterobacterales NOT DETECTED NOT DETECTED Final   Enterobacter cloacae complex NOT DETECTED NOT DETECTED Final   Escherichia coli NOT DETECTED NOT DETECTED Final   Klebsiella aerogenes NOT  DETECTED NOT DETECTED Final   Klebsiella oxytoca NOT DETECTED NOT DETECTED Final   Klebsiella pneumoniae NOT DETECTED NOT DETECTED Final   Proteus species NOT DETECTED NOT DETECTED Final   Salmonella species NOT DETECTED NOT DETECTED Final   Serratia marcescens NOT DETECTED NOT DETECTED Final   Haemophilus influenzae NOT DETECTED NOT DETECTED Final   Neisseria meningitidis NOT DETECTED NOT DETECTED Final   Pseudomonas aeruginosa NOT DETECTED NOT DETECTED Final   Stenotrophomonas maltophilia NOT DETECTED NOT DETECTED Final   Candida albicans NOT DETECTED NOT DETECTED Final   Candida auris NOT DETECTED NOT DETECTED Final   Candida glabrata NOT DETECTED NOT DETECTED Final   Candida krusei NOT DETECTED NOT DETECTED Final   Candida parapsilosis NOT DETECTED NOT DETECTED Final   Candida tropicalis NOT DETECTED NOT DETECTED Final   Cryptococcus neoformans/gattii NOT DETECTED NOT DETECTED Final   Methicillin resistance mecA/C NOT DETECTED NOT DETECTED Final    Comment: Performed at [facility], [address] [place], [place] [address]  Blood culture (routine x 2)     Status: None (Preliminary result)   Collection Time: 05/09/21  3:54 AM   Specimen: BLOOD  Result Value Ref Range Status   Specimen Description BLOOD RIGHT FA  Final   Special Requests   Final    BOTTLES DRAWN AEROBIC AND ANAEROBIC Blood  Culture adequate volume   Culture   Final    NO GROWTH <12 HOURS Performed at [facility], [address] [place], [place] [address]    Report Status PENDING  Incomplete         Radiology Studies: DG Chest Portable 1 View  Result Date: 05/09/2021 CLINICAL DATA:  Respiratory distress.  Positive COVID-19. EXAM: PORTABLE CHEST 1 VIEW COMPARISON:  Chest CT dated 10/20/2016. FINDINGS: Shallow inspiration. Bilateral patchy and streaky densities, likely sequela of prior inflammatory/infectious etiology including COVID-19. Clinical correlation is recommended. No pleural effusion pneumothorax. The cardiac silhouette is within normal limits. No acute osseous pathology. IMPRESSION: Bilateral patchy and streaky densities, likely sequela of prior inflammatory/infectious etiology. Clinical correlation is recommended. Electronically Signed   By: Anner Crete M.D.   On: 05/09/2021 03:35        Scheduled Meds:  vitamin C  1,000 mg Oral Daily   aspirin EC  81 mg Oral Daily   cholecalciferol  1,000 Units Oral Daily   enoxaparin (LOVENOX) injection  0.5 mg/kg Subcutaneous Q24H   guaiFENesin  600 mg Oral BID   influenza vaccine adjuvanted  0.5 mL Intramuscular Tomorrow-1000   insulin aspart  0-15 Units Subcutaneous TID AC & HS   insulin aspart  6 Units Subcutaneous TID WC   insulin glargine-yfgn  40 Units Subcutaneous Daily   losartan  50 mg Oral Daily   methylPREDNISolone (SOLU-MEDROL) injection  1 mg/kg Intravenous Q12H   Followed by   Derrill Memo ON 05/12/2021] predniSONE  50 mg Oral Daily   pantoprazole  40 mg Oral q AM   pneumococcal 23 valent vaccine  0.5 mL Intramuscular Tomorrow-1000   rosuvastatin  5 mg Oral q1800   tamsulosin  0.4 mg Oral Daily   zinc sulfate  220 mg Oral Daily   Continuous Infusions:  remdesivir 100 mg in NS 100 mL       LOS: 1 day    Time spent: 30 mins     Wyvonnia Dusky, MD [facility] Pager [phone]  If 7PM-7AM, please  contact night-coverage 05/10/2021, 7:45 AM

## 2021-05-11 DIAGNOSIS — J9601 Acute respiratory failure with hypoxia: Principal | ICD-10-CM

## 2021-05-11 DIAGNOSIS — C61 Malignant neoplasm of prostate: Secondary | ICD-10-CM

## 2021-05-11 DIAGNOSIS — K219 Gastro-esophageal reflux disease without esophagitis: Secondary | ICD-10-CM

## 2021-05-11 DIAGNOSIS — N400 Enlarged prostate without lower urinary tract symptoms: Secondary | ICD-10-CM

## 2021-05-11 DIAGNOSIS — J9621 Acute and chronic respiratory failure with hypoxia: Secondary | ICD-10-CM

## 2021-05-11 LAB — CBC WITH DIFFERENTIAL/PLATELET
Abs Immature Granulocytes: 0.08 10*3/uL — ABNORMAL HIGH (ref 0.00–0.07)
Basophils Absolute: 0 10*3/uL (ref 0.0–0.1)
Basophils Relative: 0 %
Eosinophils Absolute: 0 10*3/uL (ref 0.0–0.5)
Eosinophils Relative: 0 %
HCT: 38.9 % — ABNORMAL LOW (ref 39.0–52.0)
Hemoglobin: 13.6 g/dL (ref 13.0–17.0)
Immature Granulocytes: 1 %
Lymphocytes Relative: 7 %
Lymphs Abs: 0.8 10*3/uL (ref 0.7–4.0)
MCH: 31.6 pg (ref 26.0–34.0)
MCHC: 35 g/dL (ref 30.0–36.0)
MCV: 90.3 fL (ref 80.0–100.0)
Monocytes Absolute: 0.8 10*3/uL (ref 0.1–1.0)
Monocytes Relative: 7 %
Neutro Abs: 9.3 10*3/uL — ABNORMAL HIGH (ref 1.7–7.7)
Neutrophils Relative %: 85 %
Platelets: 273 10*3/uL (ref 150–400)
RBC: 4.31 MIL/uL (ref 4.22–5.81)
RDW: 12.3 % (ref 11.5–15.5)
WBC: 11 10*3/uL — ABNORMAL HIGH (ref 4.0–10.5)
nRBC: 0 % (ref 0.0–0.2)

## 2021-05-11 LAB — GLUCOSE, CAPILLARY
Glucose-Capillary: 281 mg/dL — ABNORMAL HIGH (ref 70–99)
Glucose-Capillary: 377 mg/dL — ABNORMAL HIGH (ref 70–99)
Glucose-Capillary: 340 mg/dL — ABNORMAL HIGH (ref 70–99)
Glucose-Capillary: 300 mg/dL — ABNORMAL HIGH (ref 70–99)

## 2021-05-11 LAB — FERRITIN: Ferritin: 297 ng/mL (ref 24–336)

## 2021-05-11 LAB — COMPREHENSIVE METABOLIC PANEL
ALT: 20 U/L (ref 0–44)
AST: 20 U/L (ref 15–41)
Albumin: 2.8 g/dL — ABNORMAL LOW (ref 3.5–5.0)
Alkaline Phosphatase: 74 U/L (ref 38–126)
Anion gap: 8 (ref 5–15)
BUN: 22 mg/dL (ref 8–23)
CO2: 27 mmol/L (ref 22–32)
Calcium: 8.3 mg/dL — ABNORMAL LOW (ref 8.9–10.3)
Chloride: 101 mmol/L (ref 98–111)
Creatinine, Ser: 0.68 mg/dL (ref 0.61–1.24)
GFR, Estimated: >60 mL/min (ref >60)
Glucose, Bld: 315 mg/dL — ABNORMAL HIGH (ref 70–99)
Potassium: 3.9 mmol/L (ref 3.5–5.1)
Sodium: 136 mmol/L (ref 135–145)
Total Bilirubin: 0.7 mg/dL (ref 0.3–1.2)
Total Protein: 6.3 g/dL — ABNORMAL LOW (ref 6.5–8.1)

## 2021-05-11 LAB — C-REACTIVE PROTEIN: CRP: 5.2 mg/dL — ABNORMAL HIGH (ref <1.0)

## 2021-05-11 LAB — D-DIMER, QUANTITATIVE: D-Dimer, Quant: 0.56 ug/mL-FEU — ABNORMAL HIGH (ref 0.00–0.50)

## 2021-05-11 MED ORDER — INSULIN ASPART 100 UNIT/ML IJ SOLN
10.0000 [IU] | Freq: Three times a day (TID) | INTRAMUSCULAR | Status: DC
  Administered 2021-05-11 – 2021-05-13 (×7): 10 [IU] via SUBCUTANEOUS
  Filled 2021-05-11 (×7): qty 1

## 2021-05-11 MED ORDER — ACETAMINOPHEN 325 MG PO TABS
650.0000 mg | ORAL_TABLET | Freq: Four times a day (QID) | ORAL | Status: DC | PRN
  Administered 2021-05-12: 650 mg via ORAL
  Filled 2021-05-11: qty 2

## 2021-05-11 MED ORDER — INSULIN ASPART 100 UNIT/ML IJ SOLN
0.0000 [IU] | Freq: Three times a day (TID) | INTRAMUSCULAR | Status: DC
  Administered 2021-05-11: 17:00:00 11 [IU] via SUBCUTANEOUS
  Administered 2021-05-11: 15 [IU] via SUBCUTANEOUS
  Administered 2021-05-12: 3 [IU] via SUBCUTANEOUS
  Administered 2021-05-12: 12:00:00 8 [IU] via SUBCUTANEOUS
  Administered 2021-05-12: 15 [IU] via SUBCUTANEOUS
  Administered 2021-05-13: 09:00:00 5 [IU] via SUBCUTANEOUS
  Administered 2021-05-13: 14:00:00 15 [IU] via SUBCUTANEOUS
  Filled 2021-05-11 (×7): qty 1

## 2021-05-11 MED ORDER — INSULIN ASPART 100 UNIT/ML IJ SOLN
0.0000 [IU] | Freq: Every day | INTRAMUSCULAR | Status: DC
  Administered 2021-05-11: 23:00:00 3 [IU] via SUBCUTANEOUS
  Filled 2021-05-11: qty 1

## 2021-05-11 NOTE — Progress Notes (Signed)
Patient ID: Dylan Pittman, male   DOB: [birth year], 70 y.o.   MRN: [id number] [facility] PROGRESS NOTE  TIAN DOUDS [id number] DOB: 02/09/1951 DOA: 05/09/2021 PCP: Sofie Hartigan, MD  HPI/Subjective: Patient still feels short of breath and cough.  Feels a little bit better than when coming in.  Admitted with COVID-19 pneumonia and acute hypoxic respiratory failure.  Objective: Vitals:   05/11/21 0743 05/11/21 1218  BP: 125/74 120/64  Pulse: 71 84  Resp: 16 16  Temp: 98 F (36.7 C) 98.3 F (36.8 C)  SpO2: 92% 95%   No intake or output data in the 24 hours ending 05/11/21 1610 Filed Weights   05/09/21 0313  Weight: 100.7 kg    ROS: Review of Systems  Respiratory:  Positive for cough, shortness of breath and wheezing.   Cardiovascular:  Negative for chest pain.  Gastrointestinal:  Negative for abdominal pain, nausea and vomiting.  Exam: Physical Exam HENT:     Head: Normocephalic.     Mouth/Throat:     Pharynx: No oropharyngeal exudate.  Eyes:     General: Lids are normal.     Conjunctiva/sclera: Conjunctivae normal.  Cardiovascular:     Rate and Rhythm: Normal rate and regular rhythm.     Heart sounds: Normal heart sounds, S1 normal and S2 normal.  Pulmonary:     Breath sounds: Examination of the right-middle field reveals decreased breath sounds. Examination of the left-middle field reveals decreased breath sounds. Examination of the right-lower field reveals decreased breath sounds. Examination of the left-lower field reveals decreased breath sounds. Decreased breath sounds present. No wheezing, rhonchi or rales.  Abdominal:     Palpations: Abdomen is soft.     Tenderness: There is no abdominal tenderness.  Musculoskeletal:     Right lower leg: No swelling.     Left lower leg: No swelling.  Skin:    General: Skin is warm.     Findings: No rash.  Neurological:     Mental Status: He is alert and oriented to person, place, and time.       Scheduled Meds:  (feeding supplement) PROSource Plus  30 mL Oral TID BM   vitamin C  1,000 mg Oral Daily   aspirin EC  81 mg Oral Daily   chlorpheniramine-HYDROcodone  5 mL Oral Q12H   cholecalciferol  1,000 Units Oral Daily   enoxaparin (LOVENOX) injection  0.5 mg/kg Subcutaneous Q24H   feeding supplement (GLUCERNA SHAKE)  237 mL Oral BID BM   guaiFENesin  600 mg Oral BID   insulin aspart  0-15 Units Subcutaneous TID WC   insulin aspart  0-5 Units Subcutaneous QHS   insulin aspart  10 Units Subcutaneous TID WC   insulin glargine-yfgn  50 Units Subcutaneous Daily   losartan  50 mg Oral Daily   methylPREDNISolone (SOLU-MEDROL) injection  1 mg/kg Intravenous Q12H   Followed by   Derrill Memo ON 05/12/2021] predniSONE  50 mg Oral Daily   multivitamin with minerals  1 tablet Oral Daily   pantoprazole  40 mg Oral q AM   rosuvastatin  5 mg Oral q1800   tamsulosin  0.4 mg Oral Daily   zinc sulfate  220 mg Oral Daily   Continuous Infusions:  remdesivir 100 mg in NS 100 mL 100 mg (05/11/21 [id number])    Assessment/Plan:  Acute hypoxic respiratory failure.  Patient had a pulse ox of 85% on room air on 05/09/2021.  This morning patient was on 3 L of  oxygen.  With ambulation on room air oxygen saturations plummeted to 79%.  Continue oxygen supplementation. COVID-19 pneumonia.  Continue remdesivir.  IV steroids to be completed today and will be over to oral prednisone for tomorrow. Type 2 diabetes mellitus with hyperlipidemia on Crestor.  Patient on glargine insulin 50 units daily.  Increase aspart insulin to 10 units prior to meals.  Hemoglobin A1c elevated 8.6. Recent history of prostate cancer status post seed implantation and Lupron injection. BPH on Flomax GERD on Protonix        Code Status:     Code Status Orders  (From admission, onward)           Start     Ordered   05/09/21 0512  Full code  Continuous        05/09/21 0517           Code Status History     Date  Active Date Inactive Code Status Order ID Comments User Context   02/12/2020 1403 02/12/2020 1933 Full Code [id number]  Hessie Knows, MD Inpatient      Family Communication: Left message for patient's wife Disposition Plan: Status is: Inpatient  Dispo: The patient is from: Home              Anticipated d/c is to: Home              Patient currently trying to get patient off oxygen prior to getting home.  Desaturated quite a bit today.  May need a couple more days here in the hospital   Difficult to place patient.  No  Time spent: 27 minutes  [place]

## 2021-05-11 NOTE — Progress Notes (Signed)
Inpatient Diabetes Program Recommendations  AACE/ADA: New Consensus Statement on Inpatient Glycemic Control (2015)  Target Ranges:  Prepandial:   less than 140 mg/dL      Peak postprandial:   less than 180 mg/dL (1-2 hours)      Critically ill patients:  140 - 180 mg/dL   Lab Results  Component Value Date   GLUCAP 281 (H) 05/11/2021   HGBA1C 8.6 (H) 05/09/2021    Review of Glycemic Control Results for VICTOR, SEAGRAVE (MRN [id number]) as of 05/11/2021 09:28  Ref. Range 05/10/2021 07:57 05/10/2021 11:58 05/10/2021 17:34 05/10/2021 21:50 05/11/2021 08:54  Glucose-Capillary Latest Ref Range: 70 - 99 mg/dL 209 (H) 339 (H) 325 (H) 402 (H) 281 (H)   Diabetes history: DM 2 Outpatient Diabetes medications: Amaryl 4 mg daily, Ozempic 1 mg weekly, Actos 30 mg daily, Toujeo 40 units daily, Xigduo XR 12-998 mg daily   Current orders for Inpatient glycemic control:  Semglee 50 units daily, Novolog 0-20 units tid with meals and HS, Novolog 6 units tid with meals  Solumedrol 100.625 mg q 12 hours  Inpatient Diabetes Program Recommendations:    -  Please consider increasing Novolog meal coverage to 10 units tid  Note: PO Prednisone 50 mg Daily starting tomorrow am, no titration in basal insulin today.   Thanks,  Tama Headings RN, MSN, BC-ADM Inpatient Diabetes Coordinator Team Pager [phone] (8a-5p)

## 2021-05-11 NOTE — Progress Notes (Signed)
SATURATION QUALIFICATIONS: (This note is used to comply with regulatory documentation for home oxygen)  Patient Saturations on Room Air at Rest = 93%  Patient Saturations on Room Air while Ambulating = 79% on room air ambulating to door.   Patient Saturations on 4 Liters of oxygen while Ambulating = 93% on 4L [place] ambulated back to bedside recliner, back to door, and back to bed with oxygen on and patient tolerated without exertion.  Please briefly explain why patient needs home oxygen: sitting in bedside chair on RA patient levels registered 93% on RA. Placed oxygen at 4L [place] and with some deep breathing exercises got oxygen levels up to 93% 4L .

## 2021-05-12 ENCOUNTER — Ambulatory Visit: Payer: BC Managed Care – PPO | Admitting: Physician Assistant

## 2021-05-12 ENCOUNTER — Encounter: Payer: BC Managed Care – PPO | Admitting: Physician Assistant

## 2021-05-12 LAB — CBC WITH DIFFERENTIAL/PLATELET
Abs Immature Granulocytes: 0.07 10*3/uL (ref 0.00–0.07)
Basophils Absolute: 0 10*3/uL (ref 0.0–0.1)
Basophils Relative: 0 %
Eosinophils Absolute: 0 10*3/uL (ref 0.0–0.5)
Eosinophils Relative: 0 %
HCT: 38 % — ABNORMAL LOW (ref 39.0–52.0)
Hemoglobin: 13.2 g/dL (ref 13.0–17.0)
Immature Granulocytes: 1 %
Lymphocytes Relative: 19 %
Lymphs Abs: 1.3 10*3/uL (ref 0.7–4.0)
MCH: 31.9 pg (ref 26.0–34.0)
MCHC: 34.7 g/dL (ref 30.0–36.0)
MCV: 91.8 fL (ref 80.0–100.0)
Monocytes Absolute: 0.7 10*3/uL (ref 0.1–1.0)
Monocytes Relative: 10 %
Neutro Abs: 4.9 10*3/uL (ref 1.7–7.7)
Neutrophils Relative %: 70 %
Platelets: 259 10*3/uL (ref 150–400)
RBC: 4.14 MIL/uL — ABNORMAL LOW (ref 4.22–5.81)
RDW: 12.5 % (ref 11.5–15.5)
WBC: 7 10*3/uL (ref 4.0–10.5)
nRBC: 0 % (ref 0.0–0.2)

## 2021-05-12 LAB — COMPREHENSIVE METABOLIC PANEL
ALT: 21 U/L (ref 0–44)
AST: 25 U/L (ref 15–41)
Albumin: 2.7 g/dL — ABNORMAL LOW (ref 3.5–5.0)
Alkaline Phosphatase: 66 U/L (ref 38–126)
Anion gap: 6 (ref 5–15)
BUN: 22 mg/dL (ref 8–23)
CO2: 31 mmol/L (ref 22–32)
Calcium: 7.9 mg/dL — ABNORMAL LOW (ref 8.9–10.3)
Chloride: 102 mmol/L (ref 98–111)
Creatinine, Ser: 0.53 mg/dL — ABNORMAL LOW (ref 0.61–1.24)
GFR, Estimated: >60 mL/min (ref >60)
Glucose, Bld: 215 mg/dL — ABNORMAL HIGH (ref 70–99)
Potassium: 3.7 mmol/L (ref 3.5–5.1)
Sodium: 139 mmol/L (ref 135–145)
Total Bilirubin: 0.6 mg/dL (ref 0.3–1.2)
Total Protein: 6.2 g/dL — ABNORMAL LOW (ref 6.5–8.1)

## 2021-05-12 LAB — CULTURE, BLOOD (ROUTINE X 2): Special Requests: ADEQUATE

## 2021-05-12 LAB — FERRITIN: Ferritin: 310 ng/mL (ref 24–336)

## 2021-05-12 LAB — GLUCOSE, CAPILLARY
Glucose-Capillary: 192 mg/dL — ABNORMAL HIGH (ref 70–99)
Glucose-Capillary: 278 mg/dL — ABNORMAL HIGH (ref 70–99)
Glucose-Capillary: 477 mg/dL — ABNORMAL HIGH (ref 70–99)
Glucose-Capillary: 488 mg/dL — ABNORMAL HIGH (ref 70–99)
Glucose-Capillary: 411 mg/dL — ABNORMAL HIGH (ref 70–99)
Glucose-Capillary: 430 mg/dL — ABNORMAL HIGH (ref 70–99)

## 2021-05-12 LAB — C-REACTIVE PROTEIN: CRP: 2.5 mg/dL — ABNORMAL HIGH (ref <1.0)

## 2021-05-12 LAB — D-DIMER, QUANTITATIVE: D-Dimer, Quant: 0.69 ug/mL-FEU — ABNORMAL HIGH (ref 0.00–0.50)

## 2021-05-12 MED ORDER — METHYLPREDNISOLONE SODIUM SUCC 125 MG IJ SOLR
80.0000 mg | INTRAMUSCULAR | Status: DC
  Administered 2021-05-12: 18:00:00 80 mg via INTRAVENOUS
  Filled 2021-05-12: qty 2

## 2021-05-12 MED ORDER — INSULIN ASPART 100 UNIT/ML IJ SOLN
10.0000 [IU] | Freq: Once | INTRAMUSCULAR | Status: AC
  Administered 2021-05-12: 22:00:00 10 [IU] via SUBCUTANEOUS
  Filled 2021-05-12: qty 1

## 2021-05-12 NOTE — Progress Notes (Signed)
Patient ID: Dylan Pittman, male   DOB: Jul 26, 1951, 70 y.o.   MRN: [id number] [facility] PROGRESS NOTE  Dylan Pittman [id number] DOB: Apr 17, 1951 DOA: 05/09/2021 PCP: Sofie Hartigan, MD  HPI/Subjective: Patient feels little bit better.  Still with some coughing.  Admitted with COVID-19 pneumonia and acute hypoxic respiratory failure.  Objective: Vitals:   05/12/21 0800 05/12/21 1146  BP: 122/78 120/67  Pulse: 93 88  Resp: 18 18  Temp: 98.2 F (36.8 C) 98 F (36.7 C)  SpO2: 94% 94%     Filed Weights   05/09/21 0313  Weight: 100.7 kg    ROS: Review of Systems  Respiratory:  Positive for cough and shortness of breath.   Cardiovascular:  Negative for chest pain.  Gastrointestinal:  Negative for abdominal pain, nausea and vomiting.  Exam: Physical Exam HENT:     Head: Normocephalic.     Mouth/Throat:     Pharynx: No oropharyngeal exudate.  Eyes:     General: Lids are normal.     Conjunctiva/sclera: Conjunctivae normal.  Cardiovascular:     Rate and Rhythm: Normal rate and regular rhythm.     Heart sounds: Normal heart sounds, S1 normal and S2 normal.  Pulmonary:     Breath sounds: Examination of the right-lower field reveals decreased breath sounds and rhonchi. Examination of the left-lower field reveals decreased breath sounds and rhonchi. Decreased breath sounds and rhonchi present. No wheezing or rales.  Abdominal:     Palpations: Abdomen is soft.     Tenderness: There is no abdominal tenderness.  Musculoskeletal:     Right ankle: No swelling.     Left ankle: No swelling.  Skin:    General: Skin is warm.     Findings: No rash.  Neurological:     Mental Status: He is alert and oriented to person, place, and time.      Scheduled Meds:  (feeding supplement) PROSource Plus  30 mL Oral TID BM   vitamin C  1,000 mg Oral Daily   aspirin EC  81 mg Oral Daily   chlorpheniramine-HYDROcodone  5 mL Oral Q12H   cholecalciferol  1,000 Units Oral Daily    enoxaparin (LOVENOX) injection  0.5 mg/kg Subcutaneous Q24H   feeding supplement (GLUCERNA SHAKE)  237 mL Oral BID BM   guaiFENesin  600 mg Oral BID   insulin aspart  0-15 Units Subcutaneous TID WC   insulin aspart  0-5 Units Subcutaneous QHS   insulin aspart  10 Units Subcutaneous TID WC   insulin glargine-yfgn  50 Units Subcutaneous Daily   losartan  50 mg Oral Daily   methylPREDNISolone (SOLU-MEDROL) injection  80 mg Intravenous Q24H   multivitamin with minerals  1 tablet Oral Daily   pantoprazole  40 mg Oral q AM   rosuvastatin  5 mg Oral q1800   tamsulosin  0.4 mg Oral Daily   zinc sulfate  220 mg Oral Daily   Continuous Infusions:  remdesivir 100 mg in NS 100 mL 100 mg (05/12/21 [id number])    Assessment/Plan:  Acute hypoxic respiratory failure.  Initially had a pulse ox of 85% on room air on presentation.  With ambulation today patient's pulse ox dropped down to 82%.  Still trying to get off oxygen prior to disposition.  Continue oxygen supplementation with ambulation. COVID-19 pneumonia.  Continue remdesivir.  Patient was switched to p.o. prednisone this morning.  I will switch back to IV Solu-Medrol 40 mg every 12. Type 2 diabetes mellitus with  hyperlipidemia on Crestor.  Continue glargine insulin 50 units daily.  Continue aspart insulin 10 units prior to meals.  Hemoglobin A1c elevated 8.6. Prostate cancer status post seed implantation and Lupron injection. BPH on Flomax GERD on Protonix Looking back at old CT scans.  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       Code Status:     Code Status Orders  (From admission, onward)           Start     Ordered   05/09/21 0512  Full code  Continuous        05/09/21 0517           Code Status History     Date Active Date Inactive Code Status Order ID Comments User  Context   02/12/2020 1403 02/12/2020 1933 Full Code [id number]  Hessie Knows, MD Inpatient      Family Communication: Spoke with patient's wife on the phone Disposition Plan: Status is: Inpatient  Dispo: The patient is from: Home              Anticipated d/c is to: Home              Patient currently still trying to get the patient off oxygen.  Still desaturating with ambulation.  Treating for COVID-19 pneumonia   Difficult to place patient.  No.  Time spent: 27 minutes  [place]

## 2021-05-12 NOTE — Progress Notes (Signed)
SATURATION QUALIFICATIONS: (This note is used to comply with regulatory documentation for home oxygen)  Patient Saturations on Room Air at Rest = 90%  Patient Saturations on Room Air while Ambulating = 90% for two full minutes then when patient walked back to opposite side of room, his O2 sat dropped to 82% RA. Applied oxygen at 4L [place] to get O2 levels back up to 90%. Then lowered back down to 2L [place].  Patient Saturations on 2 Liters of oxygen while Ambulating = 89-90%    Please briefly explain why patient needs home oxygen: Patient removed oxygen while sitting in chair, O2 sats registered 90%. Got patient up and walked to door, his O2 sats remained at 90%. On way back to opposite side of room, O2 sat began to fall to 82% RA. Applied 4L [place] to bring up to 91%. Moved down to 2L  and he finished ambulating back to chair with O2 sat registering 89-90% 2L .

## 2021-05-13 DIAGNOSIS — J9621 Acute and chronic respiratory failure with hypoxia: Secondary | ICD-10-CM

## 2021-05-13 LAB — COMPREHENSIVE METABOLIC PANEL
ALT: 29 U/L (ref 0–44)
AST: 29 U/L (ref 15–41)
Albumin: 2.8 g/dL — ABNORMAL LOW (ref 3.5–5.0)
Alkaline Phosphatase: 74 U/L (ref 38–126)
Anion gap: 5 (ref 5–15)
BUN: 23 mg/dL (ref 8–23)
CO2: 31 mmol/L (ref 22–32)
Calcium: 8 mg/dL — ABNORMAL LOW (ref 8.9–10.3)
Chloride: 101 mmol/L (ref 98–111)
Creatinine, Ser: 0.57 mg/dL — ABNORMAL LOW (ref 0.61–1.24)
GFR, Estimated: >60 mL/min (ref >60)
Glucose, Bld: 287 mg/dL — ABNORMAL HIGH (ref 70–99)
Potassium: 4.1 mmol/L (ref 3.5–5.1)
Sodium: 137 mmol/L (ref 135–145)
Total Bilirubin: 0.7 mg/dL (ref 0.3–1.2)
Total Protein: 6.1 g/dL — ABNORMAL LOW (ref 6.5–8.1)

## 2021-05-13 LAB — CBC WITH DIFFERENTIAL/PLATELET
Abs Immature Granulocytes: 0.12 10*3/uL — ABNORMAL HIGH (ref 0.00–0.07)
Basophils Absolute: 0 10*3/uL (ref 0.0–0.1)
Basophils Relative: 0 %
Eosinophils Absolute: 0 10*3/uL (ref 0.0–0.5)
Eosinophils Relative: 0 %
HCT: 39.9 % (ref 39.0–52.0)
Hemoglobin: 13.5 g/dL (ref 13.0–17.0)
Immature Granulocytes: 2 %
Lymphocytes Relative: 14 %
Lymphs Abs: 0.8 10*3/uL (ref 0.7–4.0)
MCH: 30.8 pg (ref 26.0–34.0)
MCHC: 33.8 g/dL (ref 30.0–36.0)
MCV: 91.1 fL (ref 80.0–100.0)
Monocytes Absolute: 0.5 10*3/uL (ref 0.1–1.0)
Monocytes Relative: 9 %
Neutro Abs: 4.4 10*3/uL (ref 1.7–7.7)
Neutrophils Relative %: 75 %
Platelets: 298 10*3/uL (ref 150–400)
RBC: 4.38 MIL/uL (ref 4.22–5.81)
RDW: 12.1 % (ref 11.5–15.5)
WBC: 5.9 10*3/uL (ref 4.0–10.5)
nRBC: 0 % (ref 0.0–0.2)

## 2021-05-13 LAB — C-REACTIVE PROTEIN: CRP: 4.8 mg/dL — ABNORMAL HIGH (ref <1.0)

## 2021-05-13 LAB — GLUCOSE, CAPILLARY
Glucose-Capillary: 233 mg/dL — ABNORMAL HIGH (ref 70–99)
Glucose-Capillary: 368 mg/dL — ABNORMAL HIGH (ref 70–99)

## 2021-05-13 LAB — FERRITIN: Ferritin: 318 ng/mL (ref 24–336)

## 2021-05-13 LAB — D-DIMER, QUANTITATIVE: D-Dimer, Quant: 0.85 ug/mL-FEU — ABNORMAL HIGH (ref 0.00–0.50)

## 2021-05-13 MED ORDER — ADVAIR HFA 115-21 MCG/ACT IN AERO: 2.0000 | INHALATION_SPRAY | Freq: Two times a day (BID) | RESPIRATORY_TRACT | 0 refills | Status: DC

## 2021-05-13 MED ORDER — ASCORBIC ACID 1000 MG PO TABS: 500.0000 mg | ORAL_TABLET | Freq: Every day | ORAL | 0 refills | Status: AC

## 2021-05-13 MED ORDER — ZINC SULFATE 220 (50 ZN) MG PO CAPS: 220.0000 mg | ORAL_CAPSULE | Freq: Every day | ORAL | 0 refills | Status: AC

## 2021-05-13 MED ORDER — GLUCERNA SHAKE PO LIQD: 237.0000 mL | Freq: Two times a day (BID) | ORAL | 0 refills | Status: AC

## 2021-05-13 MED ORDER — INSULIN PEN NEEDLE 33G X 4 MM MISC: 1.0000 | Freq: Three times a day (TID) | 0 refills | Status: DC

## 2021-05-13 MED ORDER — TOUJEO MAX SOLOSTAR 300 UNIT/ML ~~LOC~~ SOPN: 50.0000 [IU] | PEN_INJECTOR | Freq: Every morning | SUBCUTANEOUS | Status: DC

## 2021-05-13 MED ORDER — INSULIN ASPART 100 UNIT/ML FLEXPEN: 8.0000 [IU] | PEN_INJECTOR | Freq: Three times a day (TID) | SUBCUTANEOUS | 0 refills | Status: DC

## 2021-05-13 MED ORDER — HYDROCOD POLST-CPM POLST ER 10-8 MG/5ML PO SUER: 5.0000 mL | Freq: Two times a day (BID) | ORAL | 0 refills | Status: DC | PRN

## 2021-05-13 MED ORDER — PREDNISONE 10 MG PO TABS: ORAL_TABLET | ORAL | 0 refills | Status: DC

## 2021-05-13 NOTE — TOC Progression Note (Addendum)
Transition of Care [facility]) - Progression Note    Patient Details  Name: Dylan Pittman MRN: [id number] Date of Birth: [birth year]  [facility]) CM/SW [place], RN Phone Number: 05/13/2021, 11:11 AM  Clinical Narrative:  Patient lives at home with wife, who can assist him if needed.  He has no concerns about transporting to appointments or to the pharmacy and reports he is able to take medications as directed.    He does not currently have home health and has no further concerns about returning home.  He understands COVID and restrictions.  Addendum:  [employer] notified, patient will require home oxygen 2l at rest 4l ambulating.  [employer] will deliver to patient room.         Expected Discharge Plan and Services           Expected Discharge Date: 05/13/21                                     Social Determinants of Health (SDOH) Interventions    Readmission Risk Interventions No flowsheet data found.

## 2021-05-13 NOTE — Discharge Summary (Signed)
[facility] [place] NAME: Dylan Pittman    MR#:  [id number]  DATE OF BIRTH:  1951-01-20  DATE OF ADMISSION:  05/09/2021 ADMITTING PHYSICIAN: Christel Mormon, MD  DATE OF DISCHARGE: 05/13/2021  3:30 PM  PRIMARY CARE PHYSICIAN: Sofie Hartigan, MD    ADMISSION DIAGNOSIS:  Acute respiratory failure with hypoxia ([place]) [J96.01] Acute hypoxemic respiratory failure due to COVID-19 (HCC) [U07.1, J96.01] Pneumonia due to COVID-19 virus [U07.1, J12.82]  DISCHARGE DIAGNOSIS:  Active Problems:   Type 2 diabetes mellitus with hyperlipidemia (HCC)   Benign prostatic hyperplasia without lower urinary tract symptoms   Pneumonia due to COVID-19 virus   Acute respiratory failure with hypoxia ([employer])   SECONDARY DIAGNOSIS:   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  Acute hypoxic respiratory failure now chronic.  Patient unable to come off oxygen during the hospital course.  Upon discharge with ambulation on room air pulse ox dropped down to 84%.  Patient 98% on 4 L with ambulation.  Patient will be discharged home on 2 L at rest and 4 L with ambulation.  Looking back at old CT scans the patient did have pulmonary fibrotic pattern with worsening bronchiectasis in the lower lungs which could be consistent with usual interstitial pneumonia and idiopathic pulmonary fibrosis.  Follow-up with pulmonary as outpatient.  Will need to have a 6-minute exercise test in order to come off oxygen.  Advair added to his albuterol inhaler as outpatient. 2.  COVID-19 pneumonia.  Patient  completed remdesivir.  Patient was placed on Solu-Medrol during the hospital course.  We will switch over to prednisone for 5 more days. 3.  Type 2 diabetes mellitus with hyperlipidemia and hyperglycemia.  Patient's hemoglobin A1c elevated 8.6.  Sugars high secondary to steroids.  Increase patient's home dose of Toujeo to 50 units daily.  Added short acting insulin prior to meals.  Can go back on oral meds and Ozempic.  Sugars will be higher for the next 5 days while on prednisone. 4.  Prostate cancer status post seed implantation and Lupron injections 5.  BPH on Flomax 6.  GERD on Protonix 7.  1 blood culture positive likely skin contamination.  DISCHARGE CONDITIONS:   Satisfactory  CONSULTS OBTAINED:  None  DRUG ALLERGIES:  No Known Allergies  DISCHARGE MEDICATIONS:   Allergies as of 05/13/2021   No Known Allergies      Medication List     STOP taking these medications    glimepiride 4 MG tablet Commonly known as: AMARYL   Gvoke PFS 1 MG/0.2ML Sosy Generic drug: Glucagon       TAKE these medications    Advair HFA 115-21 MCG/ACT inhaler Generic drug: fluticasone-salmeterol Inhale 2 puffs into the lungs 2 (two) times daily. Rinse mouth out with water after use   albuterol 108 (90 Base) MCG/ACT inhaler Commonly known as: VENTOLIN HFA Inhale 2 puffs into the lungs every 6 (six) hours as needed for wheezing or shortness of breath.   ascorbic acid 1000 MG tablet Commonly known as: VITAMIN C Take 0.5 tablets (500  mg total) by mouth daily. Start taking on: May 14, 2021   aspirin EC 81 MG tablet Take 81 mg by mouth in the morning. Swallow whole.   chlorpheniramine-HYDROcodone 10-8 MG/5ML Suer Commonly known as: TUSSIONEX Take 5 mLs by mouth every 12 (twelve) hours as needed for cough.   cholecalciferol 25 MCG (1000 UNIT) tablet Commonly known as: VITAMIN D Take 1,000 Units by mouth in the morning.   feeding supplement (GLUCERNA SHAKE) Liqd Take 237 mLs  by mouth 2 (two) times daily between meals.   insulin aspart 100 UNIT/ML FlexPen Commonly known as: NOVOLOG Inject 8 Units into the skin 3 (three) times daily with meals. Okay to substitute generic   Insulin Pen Needle 33G X 4 MM Misc 1 Dose by Does not apply route 3 (three) times daily before meals.   losartan 50 MG tablet Commonly known as: COZAAR Take 50 mg by mouth in the morning.   Ozempic (1 MG/DOSE) 4 MG/3ML Sopn Generic drug: Semaglutide (1 MG/DOSE) Inject 1 mg into the skin every 'Sunday.   pantoprazole 40 MG tablet Commonly known as: PROTONIX Take 40 mg by mouth in the morning.   pioglitazone 30 MG tablet Commonly known as: ACTOS Take 30 mg by mouth in the morning.   predniSONE 10 MG tablet Commonly known as: DELTASONE Three tabs po daily for five days   rosuvastatin 5 MG tablet Commonly known as: CRESTOR Take 5 mg by mouth every Monday, Wednesday, and Friday. In the morning   tamsulosin 0.4 MG Caps capsule Commonly known as: FLOMAX Take 1 capsule (0.4 mg total) by mouth daily.   Toujeo Max SoloStar 300 UNIT/ML Solostar Pen Generic drug: insulin glargine (2 Unit Dial) Inject 50 Units into the skin in the morning. What changed: how much to take   Xigduo XR 12-998 MG Tb24 Generic drug: Dapagliflozin-metFORMIN HCl ER Take 2 tablets by mouth every morning.   zinc sulfate 220 (50 Zn) MG capsule Take 1 capsule (220 mg total) by mouth daily. Start taking on: May 14, 2021               Durable Medical Equipment  (From admission, onward)           Start     Ordered   05/13/21 1149  For home use only DME oxygen  Once       Comments: 2 L at rest and 4 L with ambulation  Question Answer Comment  Length of Need 12 Months   Mode or (Route) Nasal cannula   Liters per Minute 2   Frequency Continuous (stationary and portable oxygen unit needed)   Oxygen conserving device Yes   Oxygen delivery system Gas      09'$ /16/22 1149              DISCHARGE INSTRUCTIONS:   Follow-up PMD 5 days Follow-up pulmonary 2 weeks  If you experience worsening of your admission symptoms, develop shortness of breath, life threatening emergency, suicidal or homicidal thoughts you must seek medical attention immediately by calling 911 or calling your MD immediately  if symptoms less severe.  You Must read complete instructions/literature along with all the possible adverse reactions/side effects for all the Medicines you take and that have been prescribed to you. Take any new Medicines after you have completely understood and accept all the possible adverse reactions/side effects.   Please note  You were cared for by a hospitalist during your hospital stay. If you have any questions about your discharge  medications or the care you received while you were in the hospital after you are discharged, you can call the unit and asked to speak with the hospitalist on call if the hospitalist that took care of you is not available. Once you are discharged, your primary care physician will handle any further medical issues. Please note that NO REFILLS for any discharge medications will be authorized once you are discharged, as it is imperative that you return to your primary care physician (or establish a relationship with a primary care physician if you do not have one) for your aftercare needs so that they can reassess your need for medications and monitor your lab values.    Today   CHIEF COMPLAINT:   Chief Complaint  Patient presents with   Respiratory Distress    HISTORY OF PRESENT ILLNESS:  Dylan Pittman  is a 70 y.o. male came in with respiratory distress and found to have COVID-19 pneumonia with acute hypoxic respiratory failure   VITAL SIGNS:  Blood pressure 133/85, pulse 77, temperature 98.4 F (36.9 C), resp. rate 17, height '5\' 8"'$  (1.727 m), weight 100.7 kg, SpO2 90 %.  I/O:  No intake or output data in the 24 hours ending 05/13/21  1641  PHYSICAL EXAMINATION:  GENERAL:  70 y.o.-year-old patient lying in the bed with no acute distress.  EYES: Pupils equal, round, reactive to light and accommodation. No scleral icterus.  HEENT: Head atraumatic, normocephalic. Oropharynx and nasopharynx clear.  LUNGS: Decreased breath sounds bilateral bases.  Positive rhonchi at the bases.  No use of accessory muscles of respiration.  CARDIOVASCULAR: S1, S2 normal. No murmurs, rubs, or gallops.  ABDOMEN: Soft, non-tender, non-distended.  EXTREMITIES: No pedal edema.  NEUROLOGIC: Cranial nerves II through XII are intact. Muscle strength 5/5 in all extremities. Sensation intact. Gait not checked.  PSYCHIATRIC: The patient is alert and oriented x 3.  SKIN: No obvious rash, lesion, or ulcer.   DATA REVIEW:   CBC Recent Labs  Lab 05/13/21 0533  WBC 5.9  HGB 13.5  HCT 39.9  PLT 298    Chemistries  Recent Labs  Lab 05/13/21 0533  NA 137  K 4.1  CL 101  CO2 31  GLUCOSE 287*  BUN 23  CREATININE 0.57*  CALCIUM 8.0*  AST 29  ALT 29  ALKPHOS 74  BILITOT 0.7     Microbiology Results  Results for orders placed or performed during the hospital encounter of 05/09/21  Resp Panel by RT-PCR (Flu A&B, Covid) Nasopharyngeal Swab     Status: Abnormal   Collection Time: 05/09/21  3:28 AM   Specimen: Nasopharyngeal Swab; Nasopharyngeal(NP) swabs in vial transport medium  Result Value Ref Range Status   SARS Coronavirus 2 by RT PCR POSITIVE (A) NEGATIVE Final    Comment: RESULT CALLED TO, READ BACK BY AND VERIFIED WITH: [name] ALLRED AT [place] 05/09/21.PMF (NOTE) SARS-CoV-2 target nucleic acids are DETECTED.  The SARS-CoV-2 RNA is generally detectable in upper respiratory specimens during the acute phase of infection. Positive results are indicative of the presence of the identified virus, but do not rule out bacterial infection or co-infection with other pathogens not detected by the test. Clinical correlation with patient history  and other diagnostic information is necessary to determine patient infection status. The expected result is Negative.  Fact Sheet for Patients: EntrepreneurPulse.com.au  Fact Sheet for Healthcare Providers: IncredibleEmployment.be  This test is not yet approved or cleared by the [place] and  has been authorized for  detection and/or diagnosis of SARS-CoV-2 by FDA under an Emergency Use Authorization (EUA).  This EUA will remain in effect (meaning this test can be  used) for the duration of  the COVID-19 declaration under Section 564(b)(1) of the Act, 21 U.S.C. section 360bbb-3(b)(1), unless the authorization is terminated or revoked sooner.     Influenza A by PCR NEGATIVE NEGATIVE Final   Influenza B by PCR NEGATIVE NEGATIVE Final    Comment: (NOTE) The Xpert Xpress SARS-CoV-2/FLU/RSV plus assay is intended as an aid in the diagnosis of influenza from Nasopharyngeal swab specimens and should not be used as a sole basis for treatment. Nasal washings and aspirates are unacceptable for Xpert Xpress SARS-CoV-2/FLU/RSV testing.  Fact Sheet for Patients: EntrepreneurPulse.com.au  Fact Sheet for Healthcare Providers: IncredibleEmployment.be  This test is not yet approved or cleared by the [place] FDA and has been authorized for detection and/or diagnosis of SARS-CoV-2 by FDA under an Emergency Use Authorization (EUA). This EUA will remain in effect (meaning this test can be used) for the duration of the COVID-19 declaration under Section 564(b)(1) of the Act, 21 U.S.C. section 360bbb-3(b)(1), unless the authorization is terminated or revoked.  Performed at [facility], [address] [place], [place] [address]   Blood culture (routine x 2)     Status: Abnormal   Collection Time: 05/09/21  3:29 AM   Specimen: BLOOD  Result Value Ref Range Status   Specimen Description   Final     BLOOD LEFT ANTECUBITAL Performed at [facility], [address] [place], [place] [address]    Special Requests   Final    BOTTLES DRAWN AEROBIC AND ANAEROBIC Blood Culture adequate volume Performed at [facility], [address] [place], [place] [address]    Culture  Setup Time   Final    Organism ID to follow [place] CRITICAL RESULT CALLED TO, READ BACK BY AND VERIFIED WITH: CARISSA DOLAN '@2235'$  ON 05/09/21 SKL Performed at [facility], [address] [place], [place] [address]    Culture (A)  Final    STAPHYLOCOCCUS EPIDERMIDIS THE SIGNIFICANCE OF ISOLATING THIS ORGANISM FROM A SINGLE SET OF BLOOD CULTURES WHEN MULTIPLE SETS ARE DRAWN IS UNCERTAIN. PLEASE NOTIFY THE MICROBIOLOGY DEPARTMENT WITHIN ONE WEEK IF SPECIATION AND SENSITIVITIES ARE REQUIRED. Performed at [facility], [name] [address], [place], [place] [address]    Report Status 05/12/2021 FINAL  Final  Blood Culture ID Panel (Reflexed)     Status: Abnormal   Collection Time: 05/09/21  3:29 AM  Result Value Ref Range Status   Enterococcus faecalis NOT DETECTED NOT DETECTED Final   Enterococcus Faecium NOT DETECTED NOT DETECTED Final   Listeria monocytogenes NOT DETECTED NOT DETECTED Final   Staphylococcus species DETECTED (A) NOT DETECTED Final    Comment: CRITICAL RESULT CALLED TO, READ BACK BY AND VERIFIED WITH: CARISSA DOLAN '@2235'$  ON 05/09/21 SKL`    Staphylococcus aureus (BCID) NOT DETECTED NOT DETECTED Final   Staphylococcus epidermidis DETECTED (A) NOT DETECTED Final    Comment: CRITICAL RESULT CALLED TO, READ BACK BY AND VERIFIED WITH: CARISSA DOLAN '@2235'$  ON 05/09/21 SKL    Staphylococcus lugdunensis NOT DETECTED NOT DETECTED Final   Streptococcus species NOT DETECTED NOT DETECTED Final   Streptococcus agalactiae NOT DETECTED NOT DETECTED Final   Streptococcus pneumoniae NOT DETECTED NOT DETECTED Final   Streptococcus pyogenes NOT DETECTED  NOT DETECTED Final   A.calcoaceticus-baumannii NOT DETECTED NOT DETECTED Final  Bacteroides fragilis NOT DETECTED NOT DETECTED Final   Enterobacterales NOT DETECTED NOT DETECTED Final   Enterobacter cloacae complex NOT DETECTED NOT DETECTED Final   Escherichia coli NOT DETECTED NOT DETECTED Final   Klebsiella aerogenes NOT DETECTED NOT DETECTED Final   Klebsiella oxytoca NOT DETECTED NOT DETECTED Final   Klebsiella pneumoniae NOT DETECTED NOT DETECTED Final   Proteus species NOT DETECTED NOT DETECTED Final   Salmonella species NOT DETECTED NOT DETECTED Final   Serratia marcescens NOT DETECTED NOT DETECTED Final   Haemophilus influenzae NOT DETECTED NOT DETECTED Final   Neisseria meningitidis NOT DETECTED NOT DETECTED Final   Pseudomonas aeruginosa NOT DETECTED NOT DETECTED Final   Stenotrophomonas maltophilia NOT DETECTED NOT DETECTED Final   Candida albicans NOT DETECTED NOT DETECTED Final   Candida auris NOT DETECTED NOT DETECTED Final   Candida glabrata NOT DETECTED NOT DETECTED Final   Candida krusei NOT DETECTED NOT DETECTED Final   Candida parapsilosis NOT DETECTED NOT DETECTED Final   Candida tropicalis NOT DETECTED NOT DETECTED Final   Cryptococcus neoformans/gattii NOT DETECTED NOT DETECTED Final   Methicillin resistance mecA/C NOT DETECTED NOT DETECTED Final    Comment: Performed at [facility], [address] [place], [place] [address]  Blood culture (routine x 2)     Status: None (Preliminary result)   Collection Time: 05/09/21  3:54 AM   Specimen: BLOOD  Result Value Ref Range Status   Specimen Description BLOOD RIGHT FA  Final   Special Requests   Final    BOTTLES DRAWN AEROBIC AND ANAEROBIC Blood Culture adequate volume   Culture   Final    NO GROWTH 4 DAYS Performed at [facility], [address] [place], [place] [address]    Report Status PENDING  Incomplete     Management plans discussed with the patient, family and they are in  agreement.  CODE STATUS:     Code Status Orders  (From admission, onward)           Start     Ordered   05/09/21 0512  Full code  Continuous        05/09/21 0517           Code Status History     Date Active Date Inactive Code Status Order ID Comments User Context   02/12/2020 1403 02/12/2020 1933 Full Code [id number]  Hessie Knows, MD Inpatient       TOTAL TIME TAKING CARE OF THIS PATIENT: 35 minutes.    Loletha Grayer M.D on 05/13/2021 at 4:41 PM   [facility]  CC: Primary care physician; Sofie Hartigan, MD

## 2021-05-13 NOTE — Progress Notes (Signed)
SATURATION QUALIFICATIONS: (This note is used to comply with regulatory documentation for home oxygen)  Patient Saturations on Room Air at Rest = 90%  Patient Saturations on Room Air while Ambulating = 84%  Patient Saturations on 4 Liters of oxygen while Ambulating = 90%  Please briefly explain why patient needs home oxygen: patient at rest is at 90% RA. Ambulating without O2 he saturated 84%. Placed 4L  to have patient saturate 90%.

## 2021-05-13 NOTE — Progress Notes (Signed)
Inpatient Diabetes Program Recommendations  AACE/ADA: New Consensus Statement on Inpatient Glycemic Control (2015)  Target Ranges:  Prepandial:   less than 140 mg/dL      Peak postprandial:   less than 180 mg/dL (1-2 hours)      Critically ill patients:  140 - 180 mg/dL   Lab Results  Component Value Date   GLUCAP 233 (H) 05/13/2021   HGBA1C 8.6 (H) 05/09/2021    Review of Glycemic Control  Results for [name], Dylan Pittman (MRN [id number]) as of 05/13/2021 09:56  Ref. Range 05/12/2021 08:00 05/12/2021 11:45 05/12/2021 17:50 05/12/2021 18:00 05/12/2021 20:36 05/12/2021 20:55 05/13/2021 08:32  Glucose-Capillary Latest Ref Range: 70 - 99 mg/dL 192 (H) 278 (H) 488 (H) 477 (H) 411 (H) 430 (H) 233 (H)   Diabetes history: DM 2 Outpatient Diabetes medications: Amaryl 4 mg daily, Ozempic 1 mg weekly, Actos 30 mg daily, Toujeo 40 units daily, Xigduo XR 12-998 mg daily   Current orders for Inpatient glycemic control:  Semglee 50 units daily, Novolog 0-20 units tid with meals and HS, Novolog 6 units tid with meals  Solumedrol 80 mg Q24 hours Glucerna bid between meals  Inpatient Diabetes Program Recommendations:    If steroids remain at current dose consider: -  Increase Semglee to 60 units -  Increase Novolog meal coverage to 14 units tid  Note: steroids increased pt now ordered Solumedrol 80 mg Q24 hours   Thanks,  Tama Headings RN, MSN, BC-ADM Inpatient Diabetes Coordinator Team Pager [phone] (8a-5p)

## 2021-05-13 NOTE — Discharge Instructions (Signed)
2 Liters oxygen at rest and 4 L with ambulation

## 2021-05-14 LAB — CULTURE, BLOOD (ROUTINE X 2)
Culture: NO GROWTH
Special Requests: ADEQUATE

## 2021-05-24 ENCOUNTER — Other Ambulatory Visit: Payer: Self-pay

## 2021-05-24 ENCOUNTER — Ambulatory Visit
Admission: RE | Admit: 2021-05-24 | Discharge: 2021-05-24 | Disposition: A | Discharge status: Home / Self Care | Payer: BC Managed Care – PPO | Source: Ambulatory Visit | Attending: Radiation Oncology | Admitting: Radiation Oncology

## 2021-05-24 ENCOUNTER — Other Ambulatory Visit: Payer: Self-pay | Admitting: *Deleted

## 2021-05-24 VITALS — BP 126/69 | HR 110 | Temp 89.0°F | Wt 251.0 lb

## 2021-05-24 DIAGNOSIS — C61 Malignant neoplasm of prostate: Secondary | ICD-10-CM

## 2021-05-24 DIAGNOSIS — Z923 Personal history of irradiation: Secondary | ICD-10-CM | POA: Insufficient documentation

## 2021-05-24 DIAGNOSIS — R059 Cough, unspecified: Secondary | ICD-10-CM | POA: Insufficient documentation

## 2021-05-24 DIAGNOSIS — R350 Frequency of micturition: Secondary | ICD-10-CM | POA: Insufficient documentation

## 2021-05-24 DIAGNOSIS — R351 Nocturia: Secondary | ICD-10-CM | POA: Insufficient documentation

## 2021-05-24 DIAGNOSIS — K5900 Constipation, unspecified: Secondary | ICD-10-CM | POA: Diagnosis not present

## 2021-05-24 NOTE — Progress Notes (Signed)
Radiation Oncology Follow up Note  Name: Dylan Pittman   Date:   05/24/2021 MRN:  [id number] DOB: Aug 19, 1951    This 70 y.o. male presents to the clinic today for 1 month follow-up status post I-125 interstitial implant for Gleason 7 (4+3) adenocarcinoma.  REFERRING PROVIDER: Sofie Hartigan, MD  HPI: Patient is a 70 year old male now at 1 month having completed I-125 interstitial implant for Gleason 7 adenocarcinoma the prostate presenting with a PSA of 12.  Seen today in routine follow-up he is having some increased nocturia frequency of urination poor stream.  He is also tending towards constipation.  Some of his urinary symptoms predate his implant.  He is taking Flomax but in the morning of asked him to switch that to after his evening meal..  COMPLICATIONS OF TREATMENT: none  FOLLOW UP COMPLIANCE: keeps appointments   PHYSICAL EXAM:  BP 126/69   Pulse (!) 110   Temp (!) 89 F (31.7 C) (Tympanic)   Wt 251 lb (113.9 kg)   SpO2 92% Comment: 4 units of o2  BMI 38.16 kg/m  Well-developed well-nourished patient in NAD. HEENT reveals PERLA, EOMI, discs not visualized.  Oral cavity is clear. No oral mucosal lesions are identified. Neck is clear without evidence of cervical or supraclavicular adenopathy. Lungs are clear to A&P. Cardiac examination is essentially unremarkable with regular rate and rhythm without murmur rub or thrill. Abdomen is benign with no organomegaly or masses noted. Motor sensory and DTR levels are equal and symmetric in the upper and lower extremities. Cranial nerves II through XII are grossly intact. Proprioception is intact. No peripheral adenopathy or edema is identified. No motor or sensory levels are noted. Crude visual fields are within normal range.  RADIOLOGY RESULTS: CT scan for QA of seeds sources reviewed showing excellent placement of sources.  PLAN: Present time I have assured him he is right in the middle of his implant with significant still  radiation affecting his lower urinary tract.  I have asked him to switch his Flomax to after dinner every night.  Also asked him to start drinking as much cranberry juice as possible.  Also asked him to start taking MiraLAX every day for his constipation.  He does take hydrocodone for his breathing and cough.  I have asked to see him back in 3 to 4 months with a PSA prior to that visit.  Patient knows to call with any concerns.  I would like to take this opportunity to thank you for allowing me to participate in the care of your patient.[name], MD

## 2021-05-26 ENCOUNTER — Other Ambulatory Visit
Admission: RE | Admit: 2021-05-26 | Discharge: 2021-05-26 | Disposition: A | Discharge status: Home / Self Care | Payer: BC Managed Care – PPO | Source: Ambulatory Visit | Attending: Pulmonary Disease | Admitting: Pulmonary Disease

## 2021-05-26 DIAGNOSIS — J9601 Acute respiratory failure with hypoxia: Secondary | ICD-10-CM | POA: Diagnosis present

## 2021-05-26 LAB — D-DIMER, QUANTITATIVE: D-Dimer, Quant: 1.2 ug/mL-FEU — ABNORMAL HIGH (ref 0.00–0.50)

## 2021-05-27 ENCOUNTER — Other Ambulatory Visit: Payer: Self-pay | Admitting: Pulmonary Disease

## 2021-05-27 ENCOUNTER — Other Ambulatory Visit: Payer: Self-pay

## 2021-05-27 ENCOUNTER — Ambulatory Visit
Admission: RE | Admit: 2021-05-27 | Discharge: 2021-05-27 | Disposition: A | Discharge status: Home / Self Care | Payer: BC Managed Care – PPO | Source: Ambulatory Visit | Attending: Pulmonary Disease | Admitting: Pulmonary Disease

## 2021-05-27 DIAGNOSIS — R0602 Shortness of breath: Secondary | ICD-10-CM | POA: Diagnosis not present

## 2021-05-27 DIAGNOSIS — J8410 Pulmonary fibrosis, unspecified: Secondary | ICD-10-CM | POA: Diagnosis not present

## 2021-05-27 DIAGNOSIS — R7989 Other specified abnormal findings of blood chemistry: Secondary | ICD-10-CM

## 2021-05-27 MED ORDER — IOHEXOL 350 MG/ML SOLN
75.0000 mL | Freq: Once | INTRAVENOUS | Status: AC | PRN
  Administered 2021-05-27: 75 mL via INTRAVENOUS

## 2021-05-30 ENCOUNTER — Emergency Department: Payer: BC Managed Care – PPO

## 2021-05-30 ENCOUNTER — Other Ambulatory Visit: Payer: Self-pay

## 2021-05-30 ENCOUNTER — Inpatient Hospital Stay
Admission: EM | Admit: 2021-05-30 | Discharge: 2021-06-17 | DRG: 196 | Disposition: A | Discharge status: Other Acute Inpatient Hospital | Payer: BC Managed Care – PPO | Attending: Internal Medicine | Admitting: Internal Medicine

## 2021-05-30 DIAGNOSIS — N400 Enlarged prostate without lower urinary tract symptoms: Secondary | ICD-10-CM | POA: Diagnosis present

## 2021-05-30 DIAGNOSIS — U099 Post covid-19 condition, unspecified: Secondary | ICD-10-CM | POA: Diagnosis present

## 2021-05-30 DIAGNOSIS — Z20822 Contact with and (suspected) exposure to covid-19: Secondary | ICD-10-CM | POA: Diagnosis present

## 2021-05-30 DIAGNOSIS — Z87891 Personal history of nicotine dependence: Secondary | ICD-10-CM | POA: Diagnosis not present

## 2021-05-30 DIAGNOSIS — Z9981 Dependence on supplemental oxygen: Secondary | ICD-10-CM

## 2021-05-30 DIAGNOSIS — G4730 Sleep apnea, unspecified: Secondary | ICD-10-CM | POA: Diagnosis not present

## 2021-05-30 DIAGNOSIS — Z7984 Long term (current) use of oral hypoglycemic drugs: Secondary | ICD-10-CM

## 2021-05-30 DIAGNOSIS — Z7951 Long term (current) use of inhaled steroids: Secondary | ICD-10-CM

## 2021-05-30 DIAGNOSIS — B59 Pneumocystosis: Secondary | ICD-10-CM | POA: Diagnosis present

## 2021-05-30 DIAGNOSIS — J189 Pneumonia, unspecified organism: Secondary | ICD-10-CM | POA: Diagnosis present

## 2021-05-30 DIAGNOSIS — R0602 Shortness of breath: Secondary | ICD-10-CM | POA: Diagnosis present

## 2021-05-30 DIAGNOSIS — G4733 Obstructive sleep apnea (adult) (pediatric): Secondary | ICD-10-CM | POA: Diagnosis present

## 2021-05-30 DIAGNOSIS — K219 Gastro-esophageal reflux disease without esophagitis: Secondary | ICD-10-CM | POA: Diagnosis present

## 2021-05-30 DIAGNOSIS — Y95 Nosocomial condition: Secondary | ICD-10-CM | POA: Diagnosis present

## 2021-05-30 DIAGNOSIS — J849 Interstitial pulmonary disease, unspecified: Secondary | ICD-10-CM | POA: Diagnosis not present

## 2021-05-30 DIAGNOSIS — I10 Essential (primary) hypertension: Secondary | ICD-10-CM | POA: Diagnosis present

## 2021-05-30 DIAGNOSIS — I5021 Acute systolic (congestive) heart failure: Secondary | ICD-10-CM | POA: Diagnosis not present

## 2021-05-30 DIAGNOSIS — E669 Obesity, unspecified: Secondary | ICD-10-CM | POA: Diagnosis present

## 2021-05-30 DIAGNOSIS — K649 Unspecified hemorrhoids: Secondary | ICD-10-CM | POA: Diagnosis present

## 2021-05-30 DIAGNOSIS — E1169 Type 2 diabetes mellitus with other specified complication: Secondary | ICD-10-CM | POA: Diagnosis present

## 2021-05-30 DIAGNOSIS — U071 COVID-19: Secondary | ICD-10-CM | POA: Diagnosis not present

## 2021-05-30 DIAGNOSIS — Z794 Long term (current) use of insulin: Secondary | ICD-10-CM | POA: Diagnosis present

## 2021-05-30 DIAGNOSIS — E785 Hyperlipidemia, unspecified: Secondary | ICD-10-CM | POA: Diagnosis present

## 2021-05-30 DIAGNOSIS — Z6838 Body mass index (BMI) 38.0-38.9, adult: Secondary | ICD-10-CM | POA: Diagnosis not present

## 2021-05-30 DIAGNOSIS — K625 Hemorrhage of anus and rectum: Secondary | ICD-10-CM | POA: Diagnosis not present

## 2021-05-30 DIAGNOSIS — J441 Chronic obstructive pulmonary disease with (acute) exacerbation: Secondary | ICD-10-CM | POA: Diagnosis present

## 2021-05-30 DIAGNOSIS — Z79899 Other long term (current) drug therapy: Secondary | ICD-10-CM

## 2021-05-30 DIAGNOSIS — E1122 Type 2 diabetes mellitus with diabetic chronic kidney disease: Secondary | ICD-10-CM | POA: Diagnosis present

## 2021-05-30 DIAGNOSIS — E1165 Type 2 diabetes mellitus with hyperglycemia: Secondary | ICD-10-CM | POA: Diagnosis present

## 2021-05-30 DIAGNOSIS — J9621 Acute and chronic respiratory failure with hypoxia: Secondary | ICD-10-CM | POA: Diagnosis not present

## 2021-05-30 DIAGNOSIS — J8410 Pulmonary fibrosis, unspecified: Secondary | ICD-10-CM | POA: Diagnosis present

## 2021-05-30 DIAGNOSIS — J440 Chronic obstructive pulmonary disease with acute lower respiratory infection: Secondary | ICD-10-CM | POA: Diagnosis present

## 2021-05-30 DIAGNOSIS — E1121 Type 2 diabetes mellitus with diabetic nephropathy: Secondary | ICD-10-CM | POA: Diagnosis present

## 2021-05-30 DIAGNOSIS — R609 Edema, unspecified: Secondary | ICD-10-CM

## 2021-05-30 DIAGNOSIS — E119 Type 2 diabetes mellitus without complications: Secondary | ICD-10-CM | POA: Diagnosis present

## 2021-05-30 DIAGNOSIS — K602 Anal fissure, unspecified: Secondary | ICD-10-CM | POA: Diagnosis present

## 2021-05-30 DIAGNOSIS — J9620 Acute and chronic respiratory failure, unspecified whether with hypoxia or hypercapnia: Secondary | ICD-10-CM | POA: Diagnosis present

## 2021-05-30 DIAGNOSIS — K30 Functional dyspepsia: Secondary | ICD-10-CM | POA: Diagnosis present

## 2021-05-30 DIAGNOSIS — C61 Malignant neoplasm of prostate: Secondary | ICD-10-CM | POA: Diagnosis present

## 2021-05-30 DIAGNOSIS — J811 Chronic pulmonary edema: Secondary | ICD-10-CM

## 2021-05-30 LAB — D-DIMER, QUANTITATIVE: D-Dimer, Quant: 2.07 ug/mL-FEU — ABNORMAL HIGH (ref 0.00–0.50)

## 2021-05-30 LAB — CBC
HCT: 41 % (ref 39.0–52.0)
Hemoglobin: 14 g/dL (ref 13.0–17.0)
MCH: 31.3 pg (ref 26.0–34.0)
MCHC: 34.1 g/dL (ref 30.0–36.0)
MCV: 91.7 fL (ref 80.0–100.0)
Platelets: 315 10*3/uL (ref 150–400)
RBC: 4.47 MIL/uL (ref 4.22–5.81)
RDW: 13.1 % (ref 11.5–15.5)
WBC: 8.7 10*3/uL (ref 4.0–10.5)
nRBC: 0 % (ref 0.0–0.2)

## 2021-05-30 LAB — RESPIRATORY PANEL BY PCR

## 2021-05-30 LAB — COMPREHENSIVE METABOLIC PANEL
ALT: 18 U/L (ref 0–44)
AST: 24 U/L (ref 15–41)
Albumin: 3.1 g/dL — ABNORMAL LOW (ref 3.5–5.0)
Alkaline Phosphatase: 88 U/L (ref 38–126)
Anion gap: 8 (ref 5–15)
BUN: 22 mg/dL (ref 8–23)
CO2: 31 mmol/L (ref 22–32)
Calcium: 8.8 mg/dL — ABNORMAL LOW (ref 8.9–10.3)
Chloride: 94 mmol/L — ABNORMAL LOW (ref 98–111)
Creatinine, Ser: 0.97 mg/dL (ref 0.61–1.24)
GFR, Estimated: >60 mL/min (ref >60)
Glucose, Bld: 240 mg/dL — ABNORMAL HIGH (ref 70–99)
Potassium: 4.6 mmol/L (ref 3.5–5.1)
Sodium: 133 mmol/L — ABNORMAL LOW (ref 135–145)
Total Bilirubin: 1.2 mg/dL (ref 0.3–1.2)
Total Protein: 7.9 g/dL (ref 6.5–8.1)

## 2021-05-30 LAB — LACTIC ACID, PLASMA
Lactic Acid, Venous: 1.9 mmol/L (ref 0.5–1.9)
Lactic Acid, Venous: 1.9 mmol/L (ref 0.5–1.9)

## 2021-05-30 LAB — TROPONIN I (HIGH SENSITIVITY)
Troponin I (High Sensitivity): 5 ng/L (ref <18)
Troponin I (High Sensitivity): 5 ng/L (ref <18)

## 2021-05-30 LAB — PROCALCITONIN: Procalcitonin: 1.15 ng/mL

## 2021-05-30 LAB — BRAIN NATRIURETIC PEPTIDE: B Natriuretic Peptide: 17.6 pg/mL (ref 0.0–100.0)

## 2021-05-30 MED ORDER — IPRATROPIUM-ALBUTEROL 0.5-2.5 (3) MG/3ML IN SOLN: 3.0000 mL | Freq: Four times a day (QID) | RESPIRATORY_TRACT | Status: DC | PRN

## 2021-05-30 MED ORDER — HYDRALAZINE HCL 20 MG/ML IJ SOLN: 10.0000 mg | Freq: Four times a day (QID) | INTRAMUSCULAR | Status: DC | PRN

## 2021-05-30 MED ORDER — LACTATED RINGERS IV BOLUS
1000.0000 mL | Freq: Once | INTRAVENOUS | Status: AC
  Administered 2021-05-30: 1000 mL via INTRAVENOUS

## 2021-05-30 MED ORDER — ASCORBIC ACID 500 MG PO TABS
500.0000 mg | ORAL_TABLET | Freq: Every day | ORAL | Status: DC
  Administered 2021-05-31 – 2021-06-10 (×11): 500 mg via ORAL
  Filled 2021-05-30 (×13): qty 1

## 2021-05-30 MED ORDER — PANTOPRAZOLE SODIUM 40 MG PO TBEC
40.0000 mg | DELAYED_RELEASE_TABLET | Freq: Two times a day (BID) | ORAL | Status: DC
  Administered 2021-05-31 – 2021-06-11 (×23): 40 mg via ORAL
  Filled 2021-05-30 (×24): qty 1

## 2021-05-30 MED ORDER — INSULIN GLARGINE-YFGN 100 UNIT/ML ~~LOC~~ SOLN: 10.0000 [IU] | Freq: Every day | SUBCUTANEOUS | Status: DC

## 2021-05-30 MED ORDER — ENOXAPARIN SODIUM 60 MG/0.6ML IJ SOSY
0.5000 mg/kg | PREFILLED_SYRINGE | INTRAMUSCULAR | Status: DC
  Administered 2021-05-30: 57.5 mg via SUBCUTANEOUS
  Filled 2021-05-30: qty 0.6

## 2021-05-30 MED ORDER — METHYLPREDNISOLONE SODIUM SUCC 125 MG IJ SOLR
125.0000 mg | Freq: Once | INTRAMUSCULAR | Status: AC
  Administered 2021-05-30: 125 mg via INTRAVENOUS
  Filled 2021-05-30: qty 2

## 2021-05-30 MED ORDER — ZINC SULFATE 220 (50 ZN) MG PO CAPS
220.0000 mg | ORAL_CAPSULE | Freq: Every day | ORAL | Status: DC
  Administered 2021-05-31 – 2021-06-12 (×13): 220 mg via ORAL
  Filled 2021-05-30 (×14): qty 1

## 2021-05-30 MED ORDER — ENOXAPARIN SODIUM 60 MG/0.6ML IJ SOSY: 0.5000 mg/kg | PREFILLED_SYRINGE | INTRAMUSCULAR | Status: DC

## 2021-05-30 MED ORDER — FUROSEMIDE 20 MG PO TABS
20.0000 mg | ORAL_TABLET | Freq: Every day | ORAL | Status: DC
  Administered 2021-05-31 – 2021-06-17 (×18): 20 mg via ORAL
  Filled 2021-05-30 (×19): qty 1

## 2021-05-30 MED ORDER — ROSUVASTATIN CALCIUM 10 MG PO TABS
5.0000 mg | ORAL_TABLET | ORAL | Status: DC
  Administered 2021-06-01 – 2021-06-17 (×8): 5 mg via ORAL
  Filled 2021-05-30 (×13): qty 1

## 2021-05-30 MED ORDER — ONDANSETRON HCL 4 MG/2ML IJ SOLN: 4.0000 mg | Freq: Four times a day (QID) | INTRAMUSCULAR | Status: DC | PRN

## 2021-05-30 MED ORDER — VANCOMYCIN HCL IN DEXTROSE 1-5 GM/200ML-% IV SOLN
1000.0000 mg | Freq: Two times a day (BID) | INTRAVENOUS | Status: DC
  Administered 2021-05-31 – 2021-06-01 (×3): 1000 mg via INTRAVENOUS
  Filled 2021-05-30 (×5): qty 200

## 2021-05-30 MED ORDER — IPRATROPIUM-ALBUTEROL 0.5-2.5 (3) MG/3ML IN SOLN
9.0000 mL | Freq: Once | RESPIRATORY_TRACT | Status: AC
  Administered 2021-05-30: 9 mL via RESPIRATORY_TRACT
  Filled 2021-05-30: qty 9

## 2021-05-30 MED ORDER — POLYETHYLENE GLYCOL 3350 17 G PO PACK: 17.0000 g | PACK | Freq: Every day | ORAL | Status: DC | PRN

## 2021-05-30 MED ORDER — GABAPENTIN 300 MG PO CAPS
300.0000 mg | ORAL_CAPSULE | Freq: Three times a day (TID) | ORAL | Status: DC
  Administered 2021-05-31 – 2021-06-11 (×34): 300 mg via ORAL
  Filled 2021-05-30 (×33): qty 1

## 2021-05-30 MED ORDER — VANCOMYCIN HCL 2000 MG/400ML IV SOLN
2000.0000 mg | Freq: Once | INTRAVENOUS | Status: AC
  Administered 2021-05-30: 2000 mg via INTRAVENOUS
  Filled 2021-05-30: qty 400

## 2021-05-30 MED ORDER — METHYLPREDNISOLONE SODIUM SUCC 40 MG IJ SOLR
40.0000 mg | Freq: Two times a day (BID) | INTRAMUSCULAR | Status: DC
  Administered 2021-05-31 – 2021-06-03 (×7): 40 mg via INTRAVENOUS
  Filled 2021-05-30 (×7): qty 1

## 2021-05-30 MED ORDER — IOHEXOL 350 MG/ML SOLN
75.0000 mL | Freq: Once | INTRAVENOUS | Status: AC | PRN
  Administered 2021-05-30: 75 mL via INTRAVENOUS

## 2021-05-30 MED ORDER — ENOXAPARIN SODIUM 40 MG/0.4ML IJ SOSY: 40.0000 mg | PREFILLED_SYRINGE | INTRAMUSCULAR | Status: DC

## 2021-05-30 MED ORDER — IPRATROPIUM-ALBUTEROL 0.5-2.5 (3) MG/3ML IN SOLN
3.0000 mL | Freq: Four times a day (QID) | RESPIRATORY_TRACT | Status: DC
  Administered 2021-05-30 – 2021-06-17 (×65): 3 mL via RESPIRATORY_TRACT
  Filled 2021-05-30 (×70): qty 3

## 2021-05-30 MED ORDER — IPRATROPIUM-ALBUTEROL 0.5-2.5 (3) MG/3ML IN SOLN: 3.0000 mL | RESPIRATORY_TRACT | Status: DC | PRN

## 2021-05-30 MED ORDER — INSULIN ASPART 100 UNIT/ML IJ SOLN
0.0000 [IU] | Freq: Three times a day (TID) | INTRAMUSCULAR | Status: DC
  Administered 2021-05-31 (×2): 15 [IU] via SUBCUTANEOUS
  Administered 2021-05-31: 8 [IU] via SUBCUTANEOUS
  Administered 2021-05-31 – 2021-06-01 (×2): 5 [IU] via SUBCUTANEOUS
  Administered 2021-06-01: 15 [IU] via SUBCUTANEOUS
  Administered 2021-06-01 (×2): 11 [IU] via SUBCUTANEOUS
  Administered 2021-06-01: 15 [IU] via SUBCUTANEOUS
  Administered 2021-06-02: 5 [IU] via SUBCUTANEOUS
  Administered 2021-06-02 (×2): 11 [IU] via SUBCUTANEOUS
  Administered 2021-06-02: 8 [IU] via SUBCUTANEOUS
  Administered 2021-06-03: 2 [IU] via SUBCUTANEOUS
  Administered 2021-06-03: 15 [IU] via SUBCUTANEOUS
  Filled 2021-05-30 (×16): qty 1

## 2021-05-30 MED ORDER — FLUTICASONE FUROATE-VILANTEROL 200-25 MCG/INH IN AEPB
1.0000 | INHALATION_SPRAY | Freq: Every day | RESPIRATORY_TRACT | Status: DC
  Administered 2021-05-31 – 2021-06-17 (×18): 1 via RESPIRATORY_TRACT
  Filled 2021-05-30 (×2): qty 28

## 2021-05-30 MED ORDER — SODIUM CHLORIDE 0.9 % IV SOLN
2.0000 g | Freq: Three times a day (TID) | INTRAVENOUS | Status: DC
  Administered 2021-05-31 – 2021-06-01 (×4): 2 g via INTRAVENOUS
  Filled 2021-05-30 (×7): qty 2

## 2021-05-30 MED ORDER — INSULIN GLARGINE-YFGN 100 UNIT/ML ~~LOC~~ SOLN
20.0000 [IU] | Freq: Two times a day (BID) | SUBCUTANEOUS | Status: DC
  Administered 2021-05-31 – 2021-06-01 (×3): 20 [IU] via SUBCUTANEOUS
  Filled 2021-05-30 (×4): qty 0.2

## 2021-05-30 MED ORDER — SODIUM CHLORIDE 0.9 % IV SOLN
2.0000 g | Freq: Once | INTRAVENOUS | Status: AC
  Administered 2021-05-30: 2 g via INTRAVENOUS
  Filled 2021-05-30: qty 2

## 2021-05-30 MED ORDER — ACETAMINOPHEN 325 MG PO TABS
650.0000 mg | ORAL_TABLET | ORAL | Status: DC | PRN
  Administered 2021-06-02: 650 mg via ORAL
  Filled 2021-05-30: qty 2

## 2021-05-30 MED ORDER — DOCUSATE SODIUM 100 MG PO CAPS
100.0000 mg | ORAL_CAPSULE | Freq: Two times a day (BID) | ORAL | Status: DC | PRN
  Administered 2021-05-31 – 2021-06-17 (×3): 100 mg via ORAL
  Filled 2021-05-30 (×3): qty 1

## 2021-05-30 MED ORDER — TAMSULOSIN HCL 0.4 MG PO CAPS
0.4000 mg | ORAL_CAPSULE | Freq: Every day | ORAL | Status: DC
  Administered 2021-06-01 – 2021-06-17 (×17): 0.4 mg via ORAL
  Filled 2021-05-30 (×17): qty 1

## 2021-05-30 NOTE — ED Notes (Signed)
Patient switched to a hospital bed. Patient was able to stand independently. Patient continues to cough after neb treatment.

## 2021-05-30 NOTE — Progress Notes (Signed)
Pharmacy Electrolyte Monitoring Consult:  Pharmacy consulted to assist in monitoring and replacing electrolytes in this 70 y.o. male admitted on 05/30/2021 with Shortness of Breath VV:[id number] fibrosis, HTN, HDL, DM, obesity, OSA and recent diagnosis of COVID-pneumonia having been hospitalized 9/12 and 4/16 diagnosed on 2 L of oxygen at rest. Prostate CA, BPH   Labs:  Sodium (mmol/L)  Date Value  05/30/2021 133 (L)   Potassium (mmol/L)  Date Value  05/30/2021 4.6   Calcium (mg/dL)  Date Value  05/30/2021 8.8 (L)   Albumin (g/dL)  Date Value  05/30/2021 3.1 (L)    Assessment/Plan: 10/03  K 4.6  Scr 0.97  Na 133 - No electrolytes replacement at this time -f/u labs in am   Dylan Pittman A 05/30/2021 7:03 PM

## 2021-05-30 NOTE — Progress Notes (Signed)
Pharmacy Antibiotic Note  Dylan Pittman is a 70 y.o. male admitted on 05/30/2021 with pneumonia.  Pharmacy has been consulted for vancomycin and cefepime dosing. He received a 2 gram IV vancomycin loading dose in the ED this evening.  Plan:  1) start cefepime 2 grams IV every 8 hours  2) start vancomycin 1000 mg IV Q 12 hrs Goal AUC 400-550 Expected AUC: 446.8 SCr used: 0.97 mg/dL Ke: 0.079 h-1, T1/2: 8.8 h Css: 28.7 / 12.1 mcg/mL F/U MRSA PCR   Height: 5\' 8"  (172.7 cm) Weight: 113.8 kg (250 lb 14.1 oz) IBW/kg (Calculated) : 68.4  Temp (24hrs), Avg:98.4 F (36.9 C), Min:98.4 F (36.9 C), Max:98.4 F (36.9 C)  Recent Labs  Lab 05/30/21 1452 05/30/21 1732  WBC 8.7  --   CREATININE 0.97  --   LATICACIDVEN 1.9 1.9    Estimated Creatinine Clearance: 89.3 mL/min (by C-G formula based on SCr of 0.97 mg/dL).    No Known Allergies  Antimicrobials this admission: vancomycin 10/03 >>  cefepime 10/03 >>   Microbiology results: 09/12 BCx: 1/4 MSSE 10/03 BCx: pending 10/03 Sputum: pending  10/03 MRSA PCR: pending  Thank you for allowing pharmacy to be a part of this patient's care.  [name] 05/30/2021 9:43 PM

## 2021-05-30 NOTE — ED Notes (Signed)
Pt on 7 LNC, SOB, tachypneic, coughing. Sats 70's, 80's. Edp aware.

## 2021-05-30 NOTE — ED Triage Notes (Signed)
Pt was pos on sept 11th for covid, pt was admitted sept 12th with low o2 sats, pt has been admitted and discharged, diagnosed with covid pneumonia, pt took 5 days of antibiotics and steroids, pt is currently on 7L of O2 and states that he just doesn't feel like he's getting better, pt reports even standing with urinal while using O2 his O2 drops in the 60's.

## 2021-05-30 NOTE — ED Provider Notes (Signed)
Emergency Medicine Provider Triage Evaluation Note  Dylan Pittman, a 70 y.o. male  was evaluated in triage.  Pt complains of shortness of breath with increased O2 demand.  Patient is currently on 7 L of O2, and states he does not feel any better.  He reports exertional dyspnea with standing, and attempting to use the urinal.  He was recently admitted for COVID-pneumonia, with his initial diagnosis in September 11.  Patient is being followed by Dr. Lanney Gins.  Review of Systems  Positive: SOB, cough Negative: FCS  Physical Exam  BP 131/60 (BP Location: Right Arm)   Pulse (!) 105   Temp 98.4 F (36.9 C) (Oral)   Resp (!) 24   Ht 5\' 8"  (1.727 m)   Wt 113.8 kg   SpO2 91%   BMI 38.15 kg/m  Gen:   Awake, no distress  NAD. Speaks in 6+ word sentences Resp:  Normal effort  MSK:   Moves extremities without difficulty  Other:  CVS tachy rate  Medical Decision Making  Medically screening exam initiated at 2:48 PM.  Appropriate orders placed.  Stevphen Meuse was informed that the remainder of the evaluation will be completed by another provider, this initial triage assessment does not replace that evaluation, and the importance of remaining in the ED until their evaluation is complete.  Patient with "long COVID", presents for shortness of breath and increased dyspnea on exertion.   Melvenia Needles, PA-C 05/30/21 1455    Rada Hay, MD 05/30/21 [phone]

## 2021-05-30 NOTE — ED Notes (Signed)
Room darkened per patient's request.

## 2021-05-30 NOTE — H&P (Signed)
NAME:  Dylan Pittman, MRN:  [id number], DOB:  11/03/1950, LOS: 0 ADMISSION DATE:  05/30/2021, CONSULTATION DATE:  05/30/21 REFERRING MD:  Dr. Creig Hines, CHIEF COMPLAINT:   Shortness of breath  History of Present Illness:  70 year old male presenting to [facility] ED from home on 05/30/2021 with complaints of progressive hypoxia at rest/with exertion and dyspnea with exertion.  Of note patient was recently hospitalized with COVID-19 pneumonia from 05/09/2021 to 05/13/2021.  He describes that since discharge he now wears continuous oxygen via nasal cannula.  He started at 2 L at rest and 4 L with ambulation, but reports he has had to increase to 5 L nasal cannula at rest which maintains his SPO2 greater than 90%.  He reports with any exertion his SPO2 drops into the 50s to 60%.  Earlier today he took a shower with his wife's assistance his oxygen dropped to 58% and it took 45 minutes for him to recover above 90% with his nasal cannula.  He does admit to feeling dizzy today correlating with his hypoxia, but denies any recent falls or injuries. He does have a CPAP machine to wear at night for his OSA, but admits that since discharge there was equipment difficulties and he has not been able to use it consistently.  He complains of persistent coughing spells with mostly clear phlegm, however earlier today he did notice some red streaks in his sputum.  He denies chest pain, nausea/vomiting/diarrhea/abdominal pain, fever/chills, or sore throat.   ED course: Upon arrival patient's SPO2 was 91% on 7 L however with minimal movement this decreased to low 80's.  Patient's oxygen needs were titrated up to heated high flow nasal cannula 75% and 40 L.  Medications given: Patient received DuoNebs and Solu-Medrol as well as cefepime and vancomycin empirically. Initial Vitals: Afebrile at 98.4, tachypneic at 24, mildly tachycardic at 105, BP stable at 131/60 with SPO2 91% on 7 L nasal cannula. Significant labs: (Labs/ Imaging  personally reviewed) I, Domingo Pulse [place], AGACNP-BC, personally viewed and interpreted this ECG. EKG Interpretation: Date: 05/30/2021, EKG Time: 14:44, Rate: 105, Rhythm: Sinus tachycardia, QRS Axis: LAD, Intervals: Short PR interval, possible RAE and LAE, voltage criteria for LVH, ST/T Wave abnormalities: None,  Narrative Interpretation: Sinus tachycardia Chemistry: Na+: 133, K+: 4.6, Cl: 94 BUN/Cr.:  22/0.97, Serum CO2/ AG: 31/8 Hematology: WBC: 8.7, Hgb: 14,  Troponin: 5> 5, BNP: 17.6, Lactic/ PCT: 1.9> 1.9/1.15,  COVID-19 & Influenza A/B: Pending (COVID-19 positive on 05/09/2021) D-Dimer: 2.07  CXR 05/30/2021: Bilateral interstitial and hazy airspace lung opacities without significant change compared to prior chest x-ray consistent with persistent multifocal pneumonia. CT angiogram chest 05/30/2021: No evidence of acute PE.  Persistent extensive widespread areas of predominantly groundglass opacity throughout both lungs.  No significant interval change compared to previous CT. redemonstrated multiple enlarged mediastinal and bilateral hilar lymph nodes which may be reactive.  Background of chronic interstitial lung disease.  PCCM consulted for admission due to patient's high oxygen requirements, making him a risk for intubation.  Pertinent  Medical History  Pulmonary fibrosis Hypertension Hyperlipidemia Type 2 diabetes Obstructive sleep apnea COVID-19 (September 2022) Prostate cancer status post seed ablation and Lupron injections BPH GERD Bronchitis Significant Hospital Events: Including procedures, antibiotic start and stop dates in addition to other pertinent events   05/30/2021: Admit to ICU on heated high flow nasal cannula at 75%/40 L in the secondary to suspected persistent pneumonia from recent COVID-19 infection in the setting of chronic interstitial lung disease  Interim History / Subjective:  Patient alert and responsive on heated high flow nasal cannula at 75%/40 L.   Appears mildly dyspneic at rest, does admit to becoming extremely dyspneic with exertion.  Complains of a persistent cough and concerned of increased oxygen needs status post COVID-19 infection.  Otherwise no complaints at this time.  Objective   Blood pressure 119/72, pulse (!) 103, temperature 98.4 F (36.9 C), temperature source Oral, resp. rate (!) 41, height 5\' 8"  (1.727 m), weight 113.8 kg, SpO2 98 %.    FiO2 (%):  [74 %] 74 %   Intake/Output Summary (Last 24 hours) at 05/30/2021 2051 Last data filed at 05/30/2021 2045 Gross per 24 hour  Intake 108.07 ml  Output --  Net 108.07 ml   Filed Weights   05/30/21 1447  Weight: 113.8 kg    Examination: General: Adult male, acutely ill, lying in bed, NAD HEENT: MM pink/moist, anicteric, atraumatic, neck supple Neuro: A&O x 4, able to follow commands, PERRL +3, MAE CV: s1s2 RRR, sinus tachycardia on monitor, no r/m/g Pulm: Regular, mildly labored/dyspneic at rest on HHFNC 75%/40 L, breath sounds coarse/diminished-BUL & diminished-BLL GI: soft, rounded, non tender, bs x 4 Skin: Limited exam-patient still in street clothes> no rashes/lesions noted Extremities: warm/dry, pulses + 2 R/P, trace edema noted BLE  Resolved Hospital Problem list     Assessment & Plan:  Acute Hypoxic Respiratory Failure secondary to suspected pneumonia in the setting of recent COVID-19 infection & pulmonary fibrosis PMHx:COVID-19, OSA, Bronchitis - Continue HHFNC, wean FiO2 as tolerated > may need to transition to BIPAP overnight d/t OSA - Supplemental O2 to maintain SpO2 > 88% - Intermittent chest x-ray & ABG PRN - Ensure adequate pulmonary hygiene: IS Q 1 h x 10 while awake - F/u cultures, Respiratory 20 pathogen panel pending, f/u Strep pna antigen - trend PCT, daily CBC- monitor WBC/fever curve - PCT elevated at 1.15, will continue HCAP coverage: cefepime & Vancomycin - Breo-Ellipta inhaler BID, scheduled Duo-Nebs & bronchodilators PRN - Solu-medrol  40 mg BID - continue home Vitamin C & Zinc  Type 2 Diabetes Mellitus At Risk for Steroid Induced Hyperglycemia - Monitor CBG AC, HS - SSI moderate dosing, continue home long-acting coverage (solostar 40 units daily) >> Lantus 20 units BID - target range while in ICU: 140-180 - follow ICU hyper/hypo-glycemia protocol - Carb-modified diet in place  BPH - continue home flomax - strict I & O's, monitor for urinary retention  Hyperlipidemia  Hypertension - continue home rosuvastatin - due to SIRS response to suspected Pneumonia with marginal-normal BP, will hold outpatient anti-hypertensives. Consider restarting: Losartan as patient stabilizes - PRN hydralazine if SBP > 160  Best Practice (right click and "Reselect all SmartList Selections" daily)  Diet/type: Regular consistency (see orders) DVT prophylaxis: LMWH GI prophylaxis: PPI Lines: N/A Foley:  N/A Code Status:  full code Last date of multidisciplinary goals of care discussion [05/30/2021]  Labs   CBC: Recent Labs  Lab 05/30/21 1452  WBC 8.7  HGB 14.0  HCT 41.0  MCV 91.7  PLT 660    Basic Metabolic Panel: Recent Labs  Lab 05/30/21 1452  NA 133*  K 4.6  CL 94*  CO2 31  GLUCOSE 240*  BUN 22  CREATININE 0.97  CALCIUM 8.8*   GFR: Estimated Creatinine Clearance: 89.3 mL/min (by C-G formula based on SCr of 0.97 mg/dL). Recent Labs  Lab 05/30/21 1452 05/30/21 1732  PROCALCITON 1.15  --   WBC 8.7  --  LATICACIDVEN 1.9 1.9    Liver Function Tests: Recent Labs  Lab 05/30/21 1452  AST 24  ALT 18  ALKPHOS 88  BILITOT 1.2  PROT 7.9  ALBUMIN 3.1*   No results for input(s): LIPASE, AMYLASE in the last 168 hours. No results for input(s): AMMONIA in the last 168 hours.  ABG No results found for: PHART, PCO2ART, PO2ART, HCO3, TCO2, ACIDBASEDEF, O2SAT   Coagulation Profile: No results for input(s): INR, PROTIME in the last 168 hours.  Cardiac Enzymes: No results for input(s): CKTOTAL, CKMB,  CKMBINDEX, TROPONINI in the last 168 hours.  HbA1C: Hgb A1c MFr Bld  Date/Time Value Ref Range Status  05/09/2021 06:18 AM 8.6 (H) 4.8 - 5.6 % Final    Comment:    (NOTE) Pre diabetes:          5.7%-6.4%  Diabetes:              >6.4%  Glycemic control for   <7.0% adults with diabetes     CBG: No results for input(s): GLUCAP in the last 168 hours.  Review of Systems: Positives in BOLD   Gen: Denies fever, chills, weight change, fatigue, night sweats HEENT: Denies blurred vision, double vision, hearing loss, tinnitus, sinus congestion, rhinorrhea, sore throat, neck stiffness, dysphagia PULM: Denies shortness of breath, cough, sputum production, hemoptysis, wheezing CV: Denies chest pain, edema, orthopnea, paroxysmal nocturnal dyspnea, palpitations GI: Denies abdominal pain, nausea, vomiting, diarrhea, hematochezia, melena, constipation, change in bowel habits GU: Denies dysuria, hematuria, polyuria, oliguria, urethral discharge Endocrine: Denies hot or cold intolerance, polyuria, polyphagia or appetite change Derm: Denies rash, dry skin, scaling or peeling skin change Heme: Denies easy bruising, bleeding, bleeding gums Neuro: Denies headache, numbness, weakness, slurred speech, loss of memory or consciousness  Past Medical History:  He,  has a past medical history of Chronic airway obstruction (HCC), DDD (degenerative disc disease), lumbar, Dupuytren's disease, Dyslipidemia (04/07/2014), Esophageal reflux (04/20/2014), Hypertension (04/20/2014), Microalbuminuria (04/07/2014), Microalbuminuria, Obesity, unspecified (04/07/2014), Sleep apnea (04/20/2014), and 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   Surgical History:   Past Surgical History:  Procedure Laterality Date   BACK SURGERY  1992, 2005   CLOSED REDUCTION FINGER WITH PERCUTANEOUS PINNING Left 02/12/2020   Procedure: Closed reduction and pinning of left first metacarpal fracture with possible  open reduction;  Surgeon: Hessie Knows, MD;  Location: [facility] ORS;  Service: Orthopedics;  Laterality: Left;   COLONOSCOPY WITH PROPOFOL N/A 12/28/2017   Procedure: COLONOSCOPY WITH PROPOFOL;  Surgeon: Manya Silvas, MD;  Location: [facility] ENDOSCOPY;  Service: Endoscopy;  Laterality: N/A;   NASAL SEPTUM SURGERY     RADIOACTIVE SEED IMPLANT N/A 04/12/2021   Procedure: RADIOACTIVE SEED IMPLANT/BRACHYTHERAPY IMPLANT;  Surgeon: Abbie Sons, MD;  Location: [facility] ORS;  Service: Urology;  Laterality: N/A;  73 seeds implanted   VASECTOMY  2005     Social History:   reports that he has quit smoking. He has never used smokeless tobacco. He reports that he does not currently use alcohol. He reports that he does not currently use drugs.   Family History:  His family history is negative for Prostate cancer and Kidney cancer.   Allergies No Known Allergies   Home Medications  Prior to Admission medications   Medication Sig Start Date End Date Taking? Authorizing Provider  albuterol (VENTOLIN HFA) 108 (90 Base) MCG/ACT inhaler Inhale 2 puffs into the lungs every 6 (six) hours as needed for wheezing or shortness of breath. 01/20/20  Yes [provider]  ascorbic acid (VITAMIN C) 1000 MG tablet Take 0.5 tablets (500 mg total) by mouth daily. 05/14/21  Yes Wieting, Richard, MD  cholecalciferol (VITAMIN D) 25 MCG (1000 UNIT) tablet Take 1,000 Units by mouth in the morning.   Yes [provider]  fluticasone-salmeterol (ADVAIR HFA) 115-21 MCG/ACT inhaler Inhale 2 puffs into the lungs 2 (two) times daily.   Yes [provider]  furosemide (LASIX) 20 MG tablet Take 20 mg by mouth daily. 05/26/21  Yes [provider]  gabapentin (NEURONTIN) 300 MG capsule Take 300 mg by mouth 3 (three) times daily. 05/26/21  Yes [provider]  insulin aspart (NOVOLOG) 100 UNIT/ML FlexPen Inject 8 Units into the skin 3 (three) times daily with meals. Okay to substitute generic 05/13/21   Yes Wieting, Richard, MD  losartan (COZAAR) 50 MG tablet Take 50 mg by mouth in the morning. 10/07/17  Yes [provider]  OZEMPIC, 1 MG/DOSE, 4 MG/3ML SOPN Inject 1 mg into the skin every Sunday. 07/29/19  Yes [provider]  pantoprazole (PROTONIX) 40 MG tablet Take 40 mg by mouth 2 (two) times daily. 10/19/17  Yes [provider]  pioglitazone (ACTOS) 30 MG tablet Take 30 mg by mouth in the morning. 07/27/19  Yes [provider]  rosuvastatin (CRESTOR) 5 MG tablet Take 5 mg by mouth every Monday, Wednesday, and Friday. In the morning 04/19/18  Yes [provider]  tamsulosin (FLOMAX) 0.4 MG CAPS capsule Take 1 capsule (0.4 mg total) by mouth daily. 03/25/21  Yes Vaillancourt, Samantha, PA-C  TOUJEO MAX SOLOSTAR 300 UNIT/ML Solostar Pen Inject 50 Units into the skin in the morning. Patient taking differently: Inject 40 Units into the skin in the morning. 05/13/21  Yes Wieting, Richard, MD  XIGDUO XR 12-998 MG TB24 Take 2 tablets by mouth every morning. 01/31/21  Yes [provider]  zinc sulfate 220 (50 Zn) MG capsule Take 1 capsule (220 mg total) by mouth daily. 05/14/21  Yes Wieting, Richard, MD  aspirin EC 81 MG tablet Take 81 mg by mouth in the morning. Swallow whole. Patient not taking: Reported on 05/30/2021    [provider]  chlorpheniramine-HYDROcodone (TUSSIONEX) 10-8 MG/5ML SUER Take 5 mLs by mouth every 12 (twelve) hours as needed for cough. Patient not taking: No sig reported 05/13/21   Loletha Grayer, MD  feeding supplement, GLUCERNA SHAKE, (GLUCERNA SHAKE) LIQD Take 237 mLs by mouth 2 (two) times daily between meals. 05/13/21   Loletha Grayer, MD  fluticasone-salmeterol (ADVAIR HFA) 887-57 MCG/ACT inhaler Inhale 2 puffs into the lungs 2 (two) times daily. Rinse mouth out with water after use Patient not taking: No sig reported 05/13/21   Loletha Grayer, MD  Insulin Pen Needle 33G X 4 MM MISC 1 Dose by Does not apply route 3  (three) times daily before meals. 05/13/21   Loletha Grayer, MD  predniSONE (DELTASONE) 10 MG tablet Three tabs po daily for five days Patient not taking: No sig reported 05/13/21   Loletha Grayer, MD     Critical care time: 48 minutes    Venetia Night, AGACNP-BC Acute Care Nurse Practitioner [facility]   [phone] / [phone] Please see Amion for pager details.

## 2021-05-30 NOTE — Consult Note (Signed)
PHARMACY -  BRIEF ANTIBIOTIC NOTE   Pharmacy has received consult(s) for Vancomycin and Cefepime from an ED provider.  The patient's profile has been reviewed for ht/wt/allergies/indication/available labs.    One time order(s) placed for Vancomycin 2g IV and Cefepime 2g IV x 1 dose each.  Further antibiotics/pharmacy consults should be ordered by admitting physician if indicated.                       Thank you, Pearla Dubonnet 05/30/2021  6:07 PM

## 2021-05-30 NOTE — ED Notes (Signed)
Pt sats 70's. Pt placed on 15 [place]. Sats 94%

## 2021-05-30 NOTE — ED Notes (Signed)
Patient remains on heated, humidified O2.

## 2021-05-30 NOTE — ED Provider Notes (Signed)
[facility] Emergency Department Provider Note  ____________________________________________   Event Date/Time   First MD Initiated Contact with Patient 05/30/21 1518     (approximate)  I have reviewed the triage vital signs and the nursing notes.   HISTORY  Chief Complaint Shortness of Breath   HPI Dylan Pittman is a 70 y.o. male with a past medical history of pulmonary fibrosis, HTN, HDL, DM, obesity, OSA and recent diagnosis of COVID-pneumonia having been hospitalized 9/12 and 4/16 diagnosed on 2 L of oxygen at rest and 4 L with ambulation as a history of prostate cancer status post seed ablation and Lupron injections, BPH, and GERD initially having establish care with outpatient pulmonology on 9/30 having undergone a CTA at 9/30 did not show a PE presents for assessment of worsening shortness of breath over the last for 5 days.  Patient states that he has now up to 6 L as he is noted significant hypoxia with minimal exertion on his home pulse ox.  States that after using the restroom and walking couple steps will go down to the 60s or 70s before improving with rest.  States he has been coughing persistently some some posttussive chest discomfort and is noticed some red streaks in his sputum.  He denies any new GI symptoms including nausea, vomiting, diarrhea, abdominal pain or any new urinary symptoms including burning, frequency or blood.  He denies any rash, earache, sore throat, headache or extremity pain.  No recent falls or injuries.         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/2014     Patient Active Problem List   Diagnosis Date Noted   Acute and chronic respiratory failure (acute-on-chronic) ([place]) 05/30/2021   Acute on chronic respiratory failure with hypoxia ([place])    Pneumonia due to COVID-19 virus 05/09/2021   Prostate cancer ([place]) 01/24/2021   Benign prostatic hyperplasia without lower urinary tract symptoms 07/31/2019   Elevated PSA 06/18/2018   Vitamin D deficiency 01/18/2018   Esophageal reflux 04/20/2014   Hypertension 04/20/2014   Sleep apnea 04/20/2014   Dyslipidemia 04/07/2014   Microalbuminuria 04/07/2014   Obesity, unspecified 04/07/2014   Hyperlipidemia due to type 2 diabetes mellitus ([place]) 04/07/2014   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   Type 2 diabetes mellitus with hyperlipidemia ([place]) 01/08/2014    Past Surgical History:  Procedure Laterality Date   [place], 2005   CLOSED REDUCTION FINGER WITH PERCUTANEOUS PINNING Left 02/12/2020   Procedure: Closed reduction and pinning of left first metacarpal fracture with possible open reduction;  Surgeon: Hessie Knows, MD;  Location: [facility] ORS;  Service: Orthopedics;  Laterality: Left;   COLONOSCOPY WITH PROPOFOL N/A 12/28/2017   Procedure: COLONOSCOPY WITH PROPOFOL;  Surgeon: Manya Silvas, MD;  Location: [facility]COPY;  Service: Endoscopy;  Laterality: N/A;   NASAL SEPTUM SURGERY     RADIOACTIVE SEED IMPLANT N/A 04/12/2021   Procedure: RADIOACTIVE SEED IMPLANT/BRACHYTHERAPY IMPLANT;  Surgeon: Abbie Sons, MD;  Location: [facility] ORS;  Service: Urology;  Laterality: N/A;  73 seeds implanted   VASECTOMY  2005    Prior  to Admission medications   Medication Sig Start Date End Date Taking? Authorizing Provider  albuterol (VENTOLIN HFA) 108 (90 Base) MCG/ACT inhaler Inhale 2 puffs into the lungs every 6 (six) hours as needed for wheezing or shortness of breath. 01/20/20   [provider]  ascorbic acid (VITAMIN C) 1000 MG tablet Take 0.5 tablets (500  mg total) by mouth daily. 05/14/21   Loletha Grayer, MD  aspirin EC 81 MG tablet Take 81 mg by mouth in the morning. Swallow whole.    [provider]  chlorpheniramine-HYDROcodone (TUSSIONEX) 10-8 MG/5ML SUER Take 5 mLs by mouth every 12 (twelve) hours as needed for cough. 05/13/21   Loletha Grayer, MD  cholecalciferol (VITAMIN D) 25 MCG (1000 UNIT) tablet Take 1,000 Units by mouth in the morning.    [provider]  feeding supplement, GLUCERNA SHAKE, (GLUCERNA SHAKE) LIQD Take 237 mLs by mouth 2 (two) times daily between meals. 05/13/21   Loletha Grayer, MD  fluticasone-salmeterol (ADVAIR HFA) 338-25 MCG/ACT inhaler Inhale 2 puffs into the lungs 2 (two) times daily. Rinse mouth out with water after use 05/13/21   Loletha Grayer, MD  insulin aspart (NOVOLOG) 100 UNIT/ML FlexPen Inject 8 Units into the skin 3 (three) times daily with meals. Okay to substitute generic 05/13/21   Loletha Grayer, MD  Insulin Pen Needle 33G X 4 MM MISC 1 Dose by Does not apply route 3 (three) times daily before meals. 05/13/21   Loletha Grayer, MD  losartan (COZAAR) 50 MG tablet Take 50 mg by mouth in the morning. 10/07/17   [provider]  OZEMPIC, 1 MG/DOSE, 4 MG/3ML SOPN Inject 1 mg into the skin every Sunday. 07/29/19   [provider]  pantoprazole (PROTONIX) 40 MG tablet Take 40 mg by mouth in the morning. 10/19/17   [provider]  pioglitazone (ACTOS) 30 MG tablet Take 30 mg by mouth in the morning. 07/27/19   [provider]  predniSONE (DELTASONE) 10 MG tablet Three tabs po daily for five days 05/13/21   Loletha Grayer, MD  rosuvastatin (CRESTOR) 5 MG tablet Take 5 mg by mouth every Monday, Wednesday, and Friday. In the morning 04/19/18   [provider]  tamsulosin (FLOMAX) 0.4 MG CAPS capsule Take 1 capsule (0.4 mg total) by mouth daily. 03/25/21   Vaillancourt, Samantha, PA-C  TOUJEO MAX SOLOSTAR 300 UNIT/ML Solostar Pen Inject 50 Units into  the skin in the morning. 05/13/21   Wieting, Richard, MD  XIGDUO XR 12-998 MG TB24 Take 2 tablets by mouth every morning. 01/31/21   [provider]  zinc sulfate 220 (50 Zn) MG capsule Take 1 capsule (220 mg total) by mouth daily. 05/14/21   Loletha Grayer, MD    Allergies Patient has no known allergies.  Family History  Problem Relation Age of Onset   Prostate cancer Neg Hx    Kidney cancer Neg Hx     Social History Social History   Tobacco Use   Smoking status: Former   Smokeless tobacco: Never  Scientific laboratory technician Use: Never used  Substance Use Topics   Alcohol use: Not Currently   Drug use: Not Currently    Review of Systems  Review of Systems  Constitutional:  Positive for malaise/fatigue. Negative for chills and fever.  HENT:  Negative for sore throat.   Eyes:  Negative for pain.  Respiratory:  Positive for cough, hemoptysis, sputum production, shortness of breath and wheezing. Negative for stridor.   Cardiovascular:  Positive for chest pain.  Gastrointestinal:  Negative for vomiting.  Genitourinary:  Negative for dysuria.  Musculoskeletal:  Negative for myalgias.  Skin:  Negative for rash.  Neurological:  Negative for seizures, loss of consciousness and headaches.  Psychiatric/Behavioral:  Negative for suicidal ideas.   All other systems reviewed and are negative.    ____________________________________________   PHYSICAL EXAM:  VITAL SIGNS: ED Triage Vitals  Enc Vitals Group     BP 05/30/21 1446 131/60     Pulse Rate 05/30/21 1446 (!) 105     Resp 05/30/21 1446 (!) 24     Temp 05/30/21 1446 98.4 F (36.9 C)     Temp Source 05/30/21 1446 Oral     SpO2 05/30/21 1446 91 %     Weight 05/30/21 1447 250 lb 14.1 oz (113.8 kg)     Height 05/30/21 1447 5\' 8"  (1.727 m)     Head Circumference --      Peak Flow --      Pain Score 05/30/21 1447 0     Pain Loc --      Pain Edu? --      Excl. in [place]? --    Vitals:   05/30/21 1826 05/30/21 1830   BP:  109/77  Pulse:  (!) 108  Resp:  (!) 25  Temp:    SpO2: 100% 99%   Physical Exam Vitals and nursing note reviewed.  Constitutional:      Appearance: He is well-developed. He is ill-appearing.  HENT:     Head: Normocephalic and atraumatic.     Right Ear: External ear normal.     Left Ear: External ear normal.     Nose: Nose normal.  Eyes:     Conjunctiva/sclera: Conjunctivae normal.  Cardiovascular:     Rate and Rhythm: Regular rhythm. Tachycardia present.     Heart sounds: No murmur heard. Pulmonary:     Effort: Tachypnea, accessory muscle usage and respiratory distress present.     Breath sounds: Decreased breath sounds, wheezing and rhonchi present.  Abdominal:     Palpations: Abdomen is soft.     Tenderness: There is no abdominal tenderness.  Musculoskeletal:     Cervical back: Neck supple.  Skin:    General: Skin is warm and dry.     Capillary Refill: Capillary refill takes less than 2 seconds.  Neurological:     Mental Status: He is alert and oriented to person, place, and time.  Psychiatric:        Mood and Affect: Mood normal.     ____________________________________________   LABS (all labs ordered are listed, but only abnormal results are displayed)  Labs Reviewed  COMPREHENSIVE METABOLIC PANEL - Abnormal; Notable for the following components:      Result Value   Sodium 133 (*)    Chloride 94 (*)    Glucose, Bld 240 (*)    Calcium 8.8 (*)    Albumin 3.1 (*)    All other components within normal limits  D-DIMER, QUANTITATIVE - Abnormal; Notable for the following components:   D-Dimer, Quant 2.07 (*)    All other components within normal limits  CULTURE, BLOOD (SINGLE)  RESPIRATORY PANEL BY PCR  EXPECTORATED SPUTUM ASSESSMENT W GRAM STAIN, RFLX TO RESP C  CBC  LACTIC ACID, PLASMA  LACTIC ACID, PLASMA  BRAIN NATRIURETIC PEPTIDE  PROCALCITONIN  CBC  CREATININE, SERUM  LEGIONELLA PNEUMOPHILA SEROGP 1 UR AG  CBC  BASIC METABOLIC PANEL   MAGNESIUM  PHOSPHORUS  TROPONIN I (HIGH SENSITIVITY)  TROPONIN I (HIGH SENSITIVITY)   ____________________________________________  EKG  Sinus tachycardia with ventricular rate of 105, left axis deviation, unremarkable intervals without clearance of acute ischemia or significant arrhythmia. ____________________________________________  RADIOLOGY  ED MD interpretation: Chest x-ray is markable for bilateral opacities consistent with multifocal pneumonia.  No pneumothorax, overt edema or other clear acute thoracic process.  CTA shows no evidence of PE but some persistent bilateral groundglass opacities in both lungs.  There is also some multiple enlarged mediastinal and bilateral hilar lymphadenopathy.  There is also evidence of chronic background interstitial lung disease.  No other clear acute thoracic process.  Official radiology report(s): DG Chest 2 View  Result Date: 05/30/2021 CLINICAL DATA:  Pt was pos on sept 11th for covid, pt was admitted sept 12th with low o2 sats, pt has been admitted and discharged, diagnosed with covid pneumonia, pt took 5 days of antibiotics and steroids, pt is currently on 7L of O2 and states that he just doesn't feel like he's getting better, pt reports even standing with urinal while using O2 his O2 drops in the 60's. Pt is unable to stand and has extreme shortness of breath. EXAM: CHEST - 2 VIEW COMPARISON:  05/09/2021 and older studies. CT of the chest 05/27/2021. FINDINGS: Cardiac silhouette is normal in size. No mediastinal or hilar masses. Adenopathy noted on the recent CTA chest is not defined radiographically. Lungs demonstrate bilateral interstitial and hazy airspace opacities, similar to the prior chest radiograph from 05/09/2021. No new lung abnormalities. No pleural effusion or pneumothorax. Skeletal structures are intact. IMPRESSION: 1. Bilateral interstitial and hazy airspace lung opacities are without significant change compared to the prior chest  radiograph, consistent with persistent multifocal pneumonia. No new chest radiographic abnormalities. Electronically Signed   By: Lajean Manes M.D.   On: 05/30/2021 15:34   CT Angio Chest PE W and/or Wo Contrast  Result Date: 05/30/2021 CLINICAL DATA:  COVID pneumonia, hypoxia EXAM: CT ANGIOGRAPHY CHEST WITH CONTRAST TECHNIQUE: Multidetector CT imaging of the chest was performed using the standard protocol during bolus administration of intravenous contrast. Multiplanar CT image reconstructions and MIPs were obtained to evaluate the vascular anatomy. CONTRAST:  28mL OMNIPAQUE IOHEXOL 350 MG/ML SOLN COMPARISON:  CT 05/27/2021 FINDINGS: Cardiovascular: Satisfactory opacification of the pulmonary arteries to the segmental branch level. No filling defect identified to suggest pulmonary embolism. Thoracic aorta is nonaneurysmal. Atherosclerotic calcifications of the aorta and coronary arteries. Heart size is within normal limits. No pericardial effusion. Mediastinum/Nodes: Redemonstrated multiple enlarged mediastinal and bilateral hilar lymph nodes. Reference nodes include lower right paratracheal node measuring 1.6 cm (series 5, image 100), right hilar node measuring 1.7 cm (series 5, image 139), and left hilar node measuring 1.4 cm (series 5, image 177). No appreciable interval change compared to the previous CT. No axillary lymphadenopathy. Thyroid, trachea, and esophagus within normal limits. Lungs/Pleura: Persistent extensive widespread areas of predominantly ground-glass opacity throughout both lungs. No significant interval change compared to the previous CT. Background of chronic interstitial lung disease with areas of bronchiectasis and honeycombing. No pleural effusion or pneumothorax. Upper Abdomen: No acute findings within the included upper abdomen. Musculoskeletal: No chest wall abnormality. No acute or significant osseous findings. Review of the MIP images confirms the above findings. IMPRESSION: 1.  No evidence of acute pulmonary embolism. 2. Persistent extensive widespread areas of predominantly ground-glass opacity throughout both lungs. No significant interval change compared to the previous CT. 3. Redemonstrated multiple enlarged mediastinal and bilateral hilar lymph nodes,  which may be reactive. 4. Background of chronic interstitial lung disease. A follow-up high-resolution CT of the chest is recommended in 2-3 months following the resolution of patient's acute illness. Aortic Atherosclerosis (ICD10-I70.0). Electronically Signed   By: Davina Poke D.O.   On: 05/30/2021 17:25    ____________________________________________   PROCEDURES  Procedure(s) performed (including Critical Care):  .Critical Care Performed by: Lucrezia Starch, MD Authorized by: Lucrezia Starch, MD   Critical care provider statement:    Critical care time (minutes):  45   Critical care was necessary to treat or prevent imminent or life-threatening deterioration of the following conditions:  Respiratory failure   Critical care was time spent personally by me on the following activities:  Discussions with consultants, evaluation of patient's response to treatment, examination of patient, ordering and performing treatments and interventions, ordering and review of laboratory studies, ordering and review of radiographic studies, pulse oximetry, re-evaluation of patient's condition, obtaining history from patient or surrogate and review of old charts   ____________________________________________   INITIAL IMPRESSION / [place] / ED COURSE      Presents with above-stated history and exam for assessment of worsening shortness of breath, cough reductive with some blood-streaked sputum and some posttussive chest pain over the last couple days.  He has been increasing his oxygen at home based on SPO2 up to 7 L prior to arrival.  On arrival his SPO2 was 91% on 7 L.  However with minimal movement this  decreases to the low 80s and was persistently this way on 15 L high flow nasal cannula and this was placed on heated high flow to increase the rate.  Differential includes PE, acute bacterial ammonia, ongoing bronchitis from recent COVID infection in the context of interstitial lung disease, ACS, metabolic derangements, anemia and some pleurisy.  ECG and nonelevated troponin x2 are not suggestive of atypical presentation for ACS or demand ischemia.  CBC shows no leukocytosis or acute anemia.  Lactic acid is 1.9.  CMP remarkable for glucose of 240 without any other significant electrolyte or metabolic derangements.  D-dimer is elevated at 2.07.  This is elevated from 4 days ago when it was 1.2.  BNP is double to 17.6 and overall patient does not appear volume overloaded however low suspicion for acute heart failure.  Procalcitonin is elevated at 1.15 compared undetectable 3 weeks ago.  Chest x-ray is markable for bilateral opacities consistent with multifocal pneumonia.  No pneumothorax, overt edema or other clear acute thoracic process.  CTA shows no evidence of PE but some persistent bilateral groundglass opacities in both lungs.  There is also some multiple enlarged mediastinal and bilateral hilar lymphadenopathy.  There is also evidence of chronic background interstitial lung disease.  No other clear acute thoracic process.  Patient does have some wheezing additional rhonchi on exam was given duo nebs and Solu-Medrol.  Somewhat difficult to exclude an acute bacterial infection given chest imaging and so he was started on Vanco and cefepime for possible recently hospital associated pneumonia.  Patient made several SIRS criteria I do not believe he is currently septic as he is no hypotension, fever, leukocytosis or elevated lactic acid.  He was treated with broad-spectrum antibiotics and blood cultures were obtained.  Given escalating oxygen requirements emergency room I will plan to admit to medical  ICU for further evaluation and management.       ____________________________________________   FINAL CLINICAL IMPRESSION(S) / ED DIAGNOSES  Final diagnoses:  Acute on chronic  respiratory failure with hypoxia (HCC)  Healthcare-associated pneumonia    Medications  vancomycin (VANCOREADY) IVPB 2000 mg/400 mL (has no administration in time range)  ceFEPIme (MAXIPIME) 2 g in sodium chloride 0.9 % 100 mL IVPB (has no administration in time range)  lactated ringers bolus 1,000 mL (has no administration in time range)  docusate sodium (COLACE) capsule 100 mg (has no administration in time range)  polyethylene glycol (MIRALAX / GLYCOLAX) packet 17 g (has no administration in time range)  enoxaparin (LOVENOX) injection 40 mg (has no administration in time range)  acetaminophen (TYLENOL) tablet 650 mg (has no administration in time range)  ondansetron (ZOFRAN) injection 4 mg (has no administration in time range)  ipratropium-albuterol (DUONEB) 0.5-2.5 (3) MG/3ML nebulizer solution 3 mL (has no administration in time range)  ipratropium-albuterol (DUONEB) 0.5-2.5 (3) MG/3ML nebulizer solution 3 mL (has no administration in time range)  methylPREDNISolone sodium succinate (SOLU-MEDROL) 40 mg/mL injection 40 mg (has no administration in time range)  ipratropium-albuterol (DUONEB) 0.5-2.5 (3) MG/3ML nebulizer solution 9 mL (9 mLs Nebulization Given 05/30/21 1624)  iohexol (OMNIPAQUE) 350 MG/ML injection 75 mL (75 mLs Intravenous Contrast Given 05/30/21 1652)  methylPREDNISolone sodium succinate (SOLU-MEDROL) 125 mg/2 mL injection 125 mg (125 mg Intravenous Given 05/30/21 1746)     ED Discharge Orders     None        Note:  This document was prepared using Dragon voice recognition software and may include unintentional dictation errors.    Lucrezia Starch, MD 05/30/21 [id number]

## 2021-05-30 NOTE — Progress Notes (Signed)
PHARMACIST - PHYSICIAN COMMUNICATION  CONCERNING:  Enoxaparin (Lovenox) for DVT Prophylaxis    RECOMMENDATION: Patient was prescribed enoxaprin 40mg  q24 hours for VTE prophylaxis.   Filed Weights   05/30/21 1447  Weight: 113.8 kg (250 lb 14.1 oz)    Body mass index is 38.15 kg/m.  Estimated Creatinine Clearance: 89.3 mL/min (by C-G formula based on SCr of 0.97 mg/dL).   Based on [place] patient is candidate for enoxaparin 0.5mg /kg TBW SQ every 24 hours based on BMI being >30   DESCRIPTION: Pharmacy has adjusted enoxaparin dose per [facility] policy.  Patient is now receiving enoxaparin 0.5 mg/kg every 24  hours    [name], PharmD Clinical Pharmacist  05/30/2021 7:00 PM

## 2021-05-31 LAB — CBG MONITORING, ED
Glucose-Capillary: 373 mg/dL — ABNORMAL HIGH (ref 70–99)
Glucose-Capillary: 208 mg/dL — ABNORMAL HIGH (ref 70–99)
Glucose-Capillary: 317 mg/dL — ABNORMAL HIGH (ref 70–99)
Glucose-Capillary: 357 mg/dL — ABNORMAL HIGH (ref 70–99)
Glucose-Capillary: 392 mg/dL — ABNORMAL HIGH (ref 70–99)
Glucose-Capillary: 289 mg/dL — ABNORMAL HIGH (ref 70–99)

## 2021-05-31 LAB — BASIC METABOLIC PANEL
Anion gap: 8 (ref 5–15)
BUN: 20 mg/dL (ref 8–23)
CO2: 30 mmol/L (ref 22–32)
Calcium: 8.7 mg/dL — ABNORMAL LOW (ref 8.9–10.3)
Chloride: 101 mmol/L (ref 98–111)
Creatinine, Ser: 0.77 mg/dL (ref 0.61–1.24)
GFR, Estimated: >60 mL/min (ref >60)
Glucose, Bld: 212 mg/dL — ABNORMAL HIGH (ref 70–99)
Potassium: 4.4 mmol/L (ref 3.5–5.1)
Sodium: 139 mmol/L (ref 135–145)

## 2021-05-31 LAB — CBC
HCT: 37.5 % — ABNORMAL LOW (ref 39.0–52.0)
Hemoglobin: 12.6 g/dL — ABNORMAL LOW (ref 13.0–17.0)
MCH: 30.7 pg (ref 26.0–34.0)
MCHC: 33.6 g/dL (ref 30.0–36.0)
MCV: 91.2 fL (ref 80.0–100.0)
Platelets: 291 10*3/uL (ref 150–400)
RBC: 4.11 MIL/uL — ABNORMAL LOW (ref 4.22–5.81)
RDW: 12.8 % (ref 11.5–15.5)
WBC: 5.5 10*3/uL (ref 4.0–10.5)
nRBC: 0 % (ref 0.0–0.2)

## 2021-05-31 LAB — PHOSPHORUS: Phosphorus: 3.5 mg/dL (ref 2.5–4.6)

## 2021-05-31 LAB — STREP PNEUMONIAE URINARY ANTIGEN: Strep Pneumo Urinary Antigen: NEGATIVE

## 2021-05-31 LAB — MAGNESIUM: Magnesium: 2.3 mg/dL (ref 1.7–2.4)

## 2021-05-31 LAB — PROCALCITONIN: Procalcitonin: 0.63 ng/mL

## 2021-05-31 LAB — C-REACTIVE PROTEIN: CRP: 12.3 mg/dL — ABNORMAL HIGH (ref <1.0)

## 2021-05-31 LAB — MRSA NEXT GEN BY PCR, NASAL: MRSA by PCR Next Gen: NOT DETECTED

## 2021-05-31 LAB — SEDIMENTATION RATE: Sed Rate: 16 mm/hr (ref 0–20)

## 2021-05-31 MED ORDER — ENOXAPARIN SODIUM 60 MG/0.6ML IJ SOSY: 0.5000 mg/kg | PREFILLED_SYRINGE | INTRAMUSCULAR | Status: DC

## 2021-05-31 MED ORDER — CHLORHEXIDINE GLUCONATE CLOTH 2 % EX PADS
6.0000 | MEDICATED_PAD | Freq: Every day | CUTANEOUS | Status: DC
  Administered 2021-05-31 – 2021-06-12 (×11): 6 via TOPICAL
  Filled 2021-05-31: qty 6

## 2021-05-31 NOTE — ED Notes (Signed)
Pt given meal tray.

## 2021-05-31 NOTE — Progress Notes (Signed)
PHARMACIST - PHYSICIAN COMMUNICATION  CONCERNING:  Enoxaparin (Lovenox) for DVT Prophylaxis   DESCRIPTION: Patient was prescribed enoxaprin 40mg  q24 hours for VTE prophylaxis.   Filed Weights   05/30/21 1447  Weight: 113.8 kg (250 lb 14.1 oz)    Body mass index is 38.15 kg/m.  Estimated Creatinine Clearance: 89.3 mL/min (by C-G formula based on SCr of 0.97 mg/dL).   Based on [place] patient is candidate for enoxaparin 0.5mg /kg TBW SQ every 24 hours based on BMI being >30.   RECOMMENDATION: Pharmacy has adjusted enoxaparin dose per [facility] policy.  Patient is now receiving enoxaparin 57.5 mg every 24 hours    Darnelle Bos, PharmD Clinical Pharmacist  05/31/2021 1:46 AM

## 2021-05-31 NOTE — Progress Notes (Signed)
eLink Physician-Brief Progress Note Patient Name: Dylan Pittman DOB: 04/26/1951 MRN: [id number]   Date of Service  05/31/2021  HPI/Events of Note  Patient with a history of ILD who is admitted with acute hypoxemic respiratory failure secondary to exacerbation of ILD and pneumonia.  eICU Interventions  New Patient Evaluation.        Kerry Kass Dylan Pittman 05/31/2021, 10:49 PM

## 2021-05-31 NOTE — ED Notes (Signed)
Report given to [place] RN.

## 2021-05-31 NOTE — Progress Notes (Signed)
Inpatient Diabetes Program Recommendations  AACE/ADA: New Consensus Statement on Inpatient Glycemic Control (2015)  Target Ranges:  Prepandial:   less than 140 mg/dL      Peak postprandial:   less than 180 mg/dL (1-2 hours)      Critically ill patients:  140 - 180 mg/dL   Lab Results  Component Value Date   GLUCAP 317 (H) 05/31/2021   HGBA1C 8.6 (H) 05/09/2021    Review of Glycemic Control Results for Dylan Pittman, Dylan Pittman (MRN [id number]) as of 05/31/2021 13:19  Ref. Range 05/31/2021 01:16 05/31/2021 09:07 05/31/2021 11:22  Glucose-Capillary Latest Ref Range: 70 - 99 mg/dL 373 (H) 208 (H) 317 (H)   Diabetes history: DM 2  Home meds:Novolog 8 units tid with meals, Toujeo 40 units daily, Ozempic 1 mg daily, Actos 30 mg daily, Xigduo 12-998 mg  Hospital meds:  Semglee 20 units bid, Novolog moderate tid with meals and HS Inpatient Diabetes Program Recommendations:   Consider adding Novolog meal coverage 6 units tid with meals (hold if patient eats less than 50% or NPO).    Thanks,  Adah Perl, RN, BC-ADM Inpatient Diabetes Coordinator Pager [phone]  (8a-5p)

## 2021-05-31 NOTE — Progress Notes (Signed)
PHARMACY CONSULT NOTE - FOLLOW UP  Pharmacy Consult for Electrolyte Monitoring and Replacement   Recent Labs: Potassium (mmol/L)  Date Value  05/31/2021 4.4   Magnesium (mg/dL)  Date Value  05/31/2021 2.3   Calcium (mg/dL)  Date Value  05/31/2021 8.7 (L)   Albumin (g/dL)  Date Value  05/30/2021 3.1 (L)   Phosphorus (mg/dL)  Date Value  05/31/2021 3.5   Sodium (mmol/L)  Date Value  05/31/2021 139   Corr Ca: 9.42 mg/dL  Assessment: 70 yo male with past medical history of  pulmonary fibrosis, HTN, HDL, DM, obesity, OSA and recent diagnosis of COVID-pneumonia admitted on 05/30/21. Pharmacy consulted to assist in monitoring and replacing electrolytes.  Furosemide 20 mg PO daily   Goal of Therapy:  Electrolytes WNL  Plan:  No replacement indicated at this time Re-check electrolytes with AM labs  Sherilyn Banker ,PharmD Clinical Pharmacist 05/31/2021 10:12 AM

## 2021-06-01 ENCOUNTER — Ambulatory Visit: Payer: BC Managed Care – PPO | Admitting: Urology

## 2021-06-01 ENCOUNTER — Inpatient Hospital Stay: Payer: BC Managed Care – PPO

## 2021-06-01 LAB — CBC WITH DIFFERENTIAL/PLATELET
Abs Immature Granulocytes: 0.06 10*3/uL (ref 0.00–0.07)
Basophils Absolute: 0 10*3/uL (ref 0.0–0.1)
Basophils Relative: 0 %
Eosinophils Absolute: 0 10*3/uL (ref 0.0–0.5)
Eosinophils Relative: 0 %
HCT: 35.7 % — ABNORMAL LOW (ref 39.0–52.0)
Hemoglobin: 11.9 g/dL — ABNORMAL LOW (ref 13.0–17.0)
Immature Granulocytes: 1 %
Lymphocytes Relative: 9 %
Lymphs Abs: 1 10*3/uL (ref 0.7–4.0)
MCH: 30.7 pg (ref 26.0–34.0)
MCHC: 33.3 g/dL (ref 30.0–36.0)
MCV: 92.2 fL (ref 80.0–100.0)
Monocytes Absolute: 0.7 10*3/uL (ref 0.1–1.0)
Monocytes Relative: 7 %
Neutro Abs: 8.5 10*3/uL — ABNORMAL HIGH (ref 1.7–7.7)
Neutrophils Relative %: 83 %
Platelets: 342 10*3/uL (ref 150–400)
RBC: 3.87 MIL/uL — ABNORMAL LOW (ref 4.22–5.81)
RDW: 12.9 % (ref 11.5–15.5)
WBC: 10.2 10*3/uL (ref 4.0–10.5)
nRBC: 0 % (ref 0.0–0.2)

## 2021-06-01 LAB — BASIC METABOLIC PANEL
Anion gap: 7 (ref 5–15)
BUN: 20 mg/dL (ref 8–23)
CO2: 30 mmol/L (ref 22–32)
Calcium: 8.4 mg/dL — ABNORMAL LOW (ref 8.9–10.3)
Chloride: 101 mmol/L (ref 98–111)
Creatinine, Ser: 0.72 mg/dL (ref 0.61–1.24)
GFR, Estimated: >60 mL/min (ref >60)
Glucose, Bld: 250 mg/dL — ABNORMAL HIGH (ref 70–99)
Potassium: 4.2 mmol/L (ref 3.5–5.1)
Sodium: 138 mmol/L (ref 135–145)

## 2021-06-01 LAB — GLUCOSE, CAPILLARY
Glucose-Capillary: 322 mg/dL — ABNORMAL HIGH (ref 70–99)
Glucose-Capillary: 227 mg/dL — ABNORMAL HIGH (ref 70–99)
Glucose-Capillary: 333 mg/dL — ABNORMAL HIGH (ref 70–99)
Glucose-Capillary: 378 mg/dL — ABNORMAL HIGH (ref 70–99)
Glucose-Capillary: 365 mg/dL — ABNORMAL HIGH (ref 70–99)

## 2021-06-01 LAB — LEGIONELLA PNEUMOPHILA TOTAL AB: Legionella Pneumo Total Ab: 1.13 OD ratio — ABNORMAL HIGH (ref 0.00–0.90)

## 2021-06-01 LAB — LEGIONELLA PNEUMOPHILA SEROGP 1 UR AG: L. pneumophila Serogp 1 Ur Ag: NEGATIVE

## 2021-06-01 LAB — MAGNESIUM: Magnesium: 2.4 mg/dL (ref 1.7–2.4)

## 2021-06-01 LAB — PHOSPHORUS: Phosphorus: 3.2 mg/dL (ref 2.5–4.6)

## 2021-06-01 LAB — PROCALCITONIN: Procalcitonin: 0.4 ng/mL

## 2021-06-01 MED ORDER — INSULIN ASPART 100 UNIT/ML IJ SOLN
6.0000 [IU] | Freq: Three times a day (TID) | INTRAMUSCULAR | Status: DC
  Administered 2021-06-01 – 2021-06-03 (×6): 6 [IU] via SUBCUTANEOUS
  Filled 2021-06-01 (×7): qty 1

## 2021-06-01 MED ORDER — AZITHROMYCIN 250 MG PO TABS
250.0000 mg | ORAL_TABLET | Freq: Every day | ORAL | Status: DC
  Administered 2021-06-02 – 2021-06-03 (×2): 250 mg via ORAL
  Filled 2021-06-01 (×2): qty 1

## 2021-06-01 MED ORDER — AZITHROMYCIN 500 MG PO TABS
500.0000 mg | ORAL_TABLET | Freq: Every day | ORAL | Status: AC
  Administered 2021-06-01: 500 mg via ORAL
  Filled 2021-06-01: qty 1

## 2021-06-01 MED ORDER — ENOXAPARIN SODIUM 60 MG/0.6ML IJ SOSY
0.5000 mg/kg | PREFILLED_SYRINGE | INTRAMUSCULAR | Status: DC
  Administered 2021-06-01 – 2021-06-16 (×16): 50 mg via SUBCUTANEOUS
  Filled 2021-06-01 (×2): qty 0.6
  Filled 2021-06-01 (×3): qty 0.5
  Filled 2021-06-01 (×7): qty 0.6
  Filled 2021-06-01 (×2): qty 0.5
  Filled 2021-06-01 (×2): qty 0.6

## 2021-06-01 MED ORDER — HYDROCOD POLST-CPM POLST ER 10-8 MG/5ML PO SUER
5.0000 mL | Freq: Two times a day (BID) | ORAL | Status: DC
  Administered 2021-06-01 – 2021-06-06 (×11): 5 mL via ORAL
  Filled 2021-06-01 (×11): qty 5

## 2021-06-01 MED ORDER — INSULIN GLARGINE-YFGN 100 UNIT/ML ~~LOC~~ SOLN
25.0000 [IU] | Freq: Two times a day (BID) | SUBCUTANEOUS | Status: DC
  Administered 2021-06-01 – 2021-06-03 (×4): 25 [IU] via SUBCUTANEOUS
  Filled 2021-06-01 (×5): qty 0.25

## 2021-06-01 NOTE — Progress Notes (Signed)
NAME:  Dylan Pittman, MRN:  [id number], DOB:  Oct 27, 1950, LOS: 2 ADMISSION DATE:  05/30/2021, CONSULTATION DATE:  05/30/21 REFERRING MD:  Dr. Creig Hines, CHIEF COMPLAINT:   Shortness of breath  History of Present Illness:  70 year old male presenting to [facility] ED from home on 05/30/2021 with complaints of progressive hypoxia at rest/with exertion and dyspnea with exertion.  Of note patient was recently hospitalized with COVID-19 pneumonia from 05/09/2021 to 05/13/2021.  He describes that since discharge he now wears continuous oxygen via nasal cannula.  He started at 2 L at rest and 4 L with ambulation, but reports he has had to increase to 5 L nasal cannula at rest which maintains his SPO2 greater than 90%.  He reports with any exertion his SPO2 drops into the 50s to 60%.  Earlier today he took a shower with his wife's assistance his oxygen dropped to 58% and it took 45 minutes for him to recover above 90% with his nasal cannula.  He does admit to feeling dizzy today correlating with his hypoxia, but denies any recent falls or injuries. He does have a CPAP machine to wear at night for his OSA, but admits that since discharge there was equipment difficulties and he has not been able to use it consistently.  He complains of persistent coughing spells with mostly clear phlegm, however earlier today he did notice some red streaks in his sputum.  He denies chest pain, nausea/vomiting/diarrhea/abdominal pain, fever/chills, or sore throat.   ED course: Upon arrival patient's SPO2 was 91% on 7 L however with minimal movement this decreased to low 80's.  Patient's oxygen needs were titrated up to heated high flow nasal cannula 75% and 40 L.  Medications given: Patient received DuoNebs and Solu-Medrol as well as cefepime and vancomycin empirically. Initial Vitals: Afebrile at 98.4, tachypneic at 24, mildly tachycardic at 105, BP stable at 131/60 with SPO2 91% on 7 L nasal cannula. Significant labs: (Labs/ Imaging  personally reviewed) I, Domingo Pulse Rust[place]ster, AGACNP-BC, personally viewed and interpreted this ECG. EKG Interpretation: Date: 05/30/2021, EKG Time: 14:44, Rate: 105, Rhythm: Sinus tachycardia, QRS Axis: LAD, Intervals: Short PR interval, possible RAE and LAE, voltage criteria for LVH, ST/T Wave abnormalities: None,  Narrative Interpretation: Sinus tachycardia Chemistry: Na+: 133, K+: 4.6, Cl: 94 BUN/Cr.:  22/0.97, Serum CO2/ AG: 31/8 Hematology: WBC: 8.7, Hgb: 14,  Troponin: 5> 5, BNP: 17.6, Lactic/ PCT: 1.9> 1.9/1.15,  COVID-19 & Influenza A/B: Pending (COVID-19 positive on 05/09/2021) D-Dimer: 2.07  CXR 05/30/2021: Bilateral interstitial and hazy airspace lung opacities without significant change compared to prior chest x-ray consistent with persistent multifocal pneumonia. CT angiogram chest 05/30/2021: No evidence of acute PE.  Persistent extensive widespread areas of predominantly groundglass opacity throughout both lungs.  No significant interval change compared to previous CT. redemonstrated multiple enlarged mediastinal and bilateral hilar lymph nodes which may be reactive.  Background of chronic interstitial lung disease.  PCCM consulted for admission due to patient's high oxygen requirements, making him a risk for intubation.  Pertinent  Medical History  Pulmonary fibrosis Hypertension Hyperlipidemia Type 2 diabetes Obstructive sleep apnea COVID-19 (September 2022) Prostate cancer status post seed ablation and Lupron injections BPH GERD Bronchitis Significant Hospital Events: Including procedures, antibiotic start and stop dates in addition to other pertinent events   05/30/2021: Admit to ICU on heated high flow nasal cannula at 75%/40 L in the secondary to suspected persistent pneumonia from recent COVID-19 infection in the setting of chronic interstitial lung disease 06/01/21-  Patient  is improved, reviewed medical plan with patient and wife    Objective   Blood pressure  135/85, pulse (!) 103, temperature 98.4 F (36.9 C), temperature source Oral, resp. rate (!) 24, height 5\' 8"  (1.727 m), weight 98.4 kg, SpO2 91 %.    FiO2 (%):  [60 %-100 %] 60 %   Intake/Output Summary (Last 24 hours) at 06/01/2021 1525 Last data filed at 06/01/2021 1300 Gross per 24 hour  Intake 1097.06 ml  Output 2950 ml  Net -1852.94 ml    Filed Weights   05/30/21 1447 05/31/21 2145 06/01/21 0500  Weight: 113.8 kg 98.4 kg 98.4 kg    Examination: General: Adult male, acutely ill, lying in bed, NAD HEENT: MM pink/moist, anicteric, atraumatic, neck supple Neuro: A&O x 4, able to follow commands, PERRL +3, MAE CV: s1s2 RRR, sinus tachycardia on monitor, no r/m/g Pulm: Regular, mildly labored/dyspneic at rest on HHFNC55% breath sounds coarse/diminished-BUL & diminished-BLL GI: soft, rounded, non tender, bs x 4 Skin: Limited exam-patient still in street clothes> no rashes/lesions noted Extremities: warm/dry, pulses + 2 R/P, trace edema noted BLE  Resolved Hospital Problem list     Assessment & Plan:  Acute Hypoxic Respiratory Failure secondary to suspected pneumonia in the setting of recent COVID-19 infection & pulmonary fibrosis PMHx:COVID-19, OSA, Bronchitis - Continue HHFNC, wean FiO2 as tolerated > may need to transition to BIPAP overnight d/t OSA - Supplemental O2 to maintain SpO2 > 88% - Intermittent chest x-ray & ABG PRN - Ensure adequate pulmonary hygiene: IS Q 1 h x 10 while awake - F/u cultures, Respiratory 20 pathogen panel pending, f/u Strep pna antigen - trend PCT, daily CBC- monitor WBC/fever curve - PCT elevated at 1.15, will continue HCAP coverage: cefepime & Vancomycin - Breo-Ellipta inhaler BID, scheduled Duo-Nebs & bronchodilators PRN - Solu-medrol 40 mg BID - continue home Vitamin C & Zinc  Type 2 Diabetes Mellitus At Risk for Steroid Induced Hyperglycemia - Monitor CBG AC, HS - SSI moderate dosing, continue home long-acting coverage (solostar 40  units daily) >> Lantus 20 units BID - target range while in ICU: 140-180 - follow ICU hyper/hypo-glycemia protocol - Carb-modified diet in place  BPH - continue home flomax - strict I & O's, monitor for urinary retention  Hyperlipidemia  Hypertension - continue home rosuvastatin - due to SIRS response to suspected Pneumonia with marginal-normal BP, will hold outpatient anti-hypertensives. Consider restarting: Losartan as patient stabilizes - PRN hydralazine if SBP > 160  Best Practice (right click and "Reselect all SmartList Selections" daily)  Diet/type: Regular consistency (see orders) DVT prophylaxis: LMWH GI prophylaxis: PPI Lines: N/A Foley:  N/A Code Status:  full code Last date of multidisciplinary goals of care discussion [05/30/2021]  Labs   CBC: Recent Labs  Lab 05/30/21 1452 05/31/21 0625 06/01/21 0435  WBC 8.7 5.5 10.2  NEUTROABS  --   --  8.5*  HGB 14.0 12.6* 11.9*  HCT 41.0 37.5* 35.7*  MCV 91.7 91.2 92.2  PLT 315 291 342     Basic Metabolic Panel: Recent Labs  Lab 05/30/21 1452 05/31/21 0625 06/01/21 0435  NA 133* 139 138  K 4.6 4.4 4.2  CL 94* 101 101  CO2 31 30 30   GLUCOSE 240* 212* 250*  BUN 22 20 20   CREATININE 0.97 0.77 0.72  CALCIUM 8.8* 8.7* 8.4*  MG  --  2.3 2.4  PHOS  --  3.5 3.2    GFR: Estimated Creatinine Clearance: 100.5 mL/min (by C-G formula  based on SCr of 0.72 mg/dL). Recent Labs  Lab 05/30/21 1452 05/30/21 1732 05/31/21 0625 06/01/21 0435  PROCALCITON 1.15  --  0.63 0.40  WBC 8.7  --  5.5 10.2  LATICACIDVEN 1.9 1.9  --   --      Liver Function Tests: Recent Labs  Lab 05/30/21 1452  AST 24  ALT 18  ALKPHOS 88  BILITOT 1.2  PROT 7.9  ALBUMIN 3.1*    No results for input(s): LIPASE, AMYLASE in the last 168 hours. No results for input(s): AMMONIA in the last 168 hours.  ABG No results found for: PHART, PCO2ART, PO2ART, HCO3, TCO2, ACIDBASEDEF, O2SAT   Coagulation Profile: No results for input(s):  INR, PROTIME in the last 168 hours.  Cardiac Enzymes: No results for input(s): CKTOTAL, CKMB, CKMBINDEX, TROPONINI in the last 168 hours.  HbA1C: Hgb A1c MFr Bld  Date/Time Value Ref Range Status  05/09/2021 06:18 AM 8.6 (H) 4.8 - 5.6 % Final    Comment:    (NOTE) Pre diabetes:          5.7%-6.4%  Diabetes:              >6.4%  Glycemic control for   <7.0% adults with diabetes     CBG: Recent Labs  Lab 05/31/21 1455 05/31/21 1742 05/31/21 2151 06/01/21 0759 06/01/21 1210  GLUCAP 392* 289* 322* 227* 333*    Review of Systems: Positives in BOLD   Gen: Denies fever, chills, weight change, fatigue, night sweats HEENT: Denies blurred vision, double vision, hearing loss, tinnitus, sinus congestion, rhinorrhea, sore throat, neck stiffness, dysphagia PULM: Denies shortness of breath, cough, sputum production, hemoptysis, wheezing CV: Denies chest pain, edema, orthopnea, paroxysmal nocturnal dyspnea, palpitations GI: Denies abdominal pain, nausea, vomiting, diarrhea, hematochezia, melena, constipation, change in bowel habits GU: Denies dysuria, hematuria, polyuria, oliguria, urethral discharge Endocrine: Denies hot or cold intolerance, polyuria, polyphagia or appetite change Derm: Denies rash, dry skin, scaling or peeling skin change Heme: Denies easy bruising, bleeding, bleeding gums Neuro: Denies headache, numbness, weakness, slurred speech, loss of memory or consciousness  Past Medical History:  He,  has a past medical history of Chronic airway obstruction (HCC), DDD (degenerative disc disease), lumbar, Dupuytren's disease, Dyslipidemia (04/07/2014), Esophageal reflux (04/20/2014), Hypertension (04/20/2014), Microalbuminuria (04/07/2014), Microalbuminuria, Obesity, unspecified (04/07/2014), Sleep apnea (04/20/2014), and 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   Surgical History:   Past Surgical History:  Procedure Laterality Date   BACK SURGERY   1992, 2005   CLOSED REDUCTION FINGER WITH PERCUTANEOUS PINNING Left 02/12/2020   Procedure: Closed reduction and pinning of left first metacarpal fracture with possible open reduction;  Surgeon: Hessie Knows, MD;  Location: [facility] ORS;  Service: Orthopedics;  Laterality: Left;   COLONOSCOPY WITH PROPOFOL N/A 12/28/2017   Procedure: COLONOSCOPY WITH PROPOFOL;  Surgeon: Manya Silvas, MD;  Location: [facility] ENDOSCOPY;  Service: Endoscopy;  Laterality: N/A;   NASAL SEPTUM SURGERY     RADIOACTIVE SEED IMPLANT N/A 04/12/2021   Procedure: RADIOACTIVE SEED IMPLANT/BRACHYTHERAPY IMPLANT;  Surgeon: Abbie Sons, MD;  Location: [facility] ORS;  Service: Urology;  Laterality: N/A;  73 seeds implanted   VASECTOMY  2005     Social History:   reports that he has quit smoking. He has never used smokeless tobacco. He reports that he does not currently use alcohol. He reports that he does not currently use drugs.   Family History:  His family history is negative for Prostate cancer and Kidney cancer.  Allergies No Known Allergies   Home Medications  Prior to Admission medications   Medication Sig Start Date End Date Taking? Authorizing Provider  albuterol (VENTOLIN HFA) 108 (90 Base) MCG/ACT inhaler Inhale 2 puffs into the lungs every 6 (six) hours as needed for wheezing or shortness of breath. 01/20/20  Yes [provider]  ascorbic acid (VITAMIN C) 1000 MG tablet Take 0.5 tablets (500 mg total) by mouth daily. 05/14/21  Yes Wieting, Richard, MD  cholecalciferol (VITAMIN D) 25 MCG (1000 UNIT) tablet Take 1,000 Units by mouth in the morning.   Yes [provider]  fluticasone-salmeterol (ADVAIR HFA) 115-21 MCG/ACT inhaler Inhale 2 puffs into the lungs 2 (two) times daily.   Yes [provider]  furosemide (LASIX) 20 MG tablet Take 20 mg by mouth daily. 05/26/21  Yes [provider]  gabapentin (NEURONTIN) 300 MG capsule Take 300 mg by mouth 3 (three) times daily. 05/26/21  Yes  [provider]  insulin aspart (NOVOLOG) 100 UNIT/ML FlexPen Inject 8 Units into the skin 3 (three) times daily with meals. Okay to substitute generic 05/13/21  Yes Wieting, Richard, MD  losartan (COZAAR) 50 MG tablet Take 50 mg by mouth in the morning. 10/07/17  Yes [provider]  OZEMPIC, 1 MG/DOSE, 4 MG/3ML SOPN Inject 1 mg into the skin every Sunday. 07/29/19  Yes [provider]  pantoprazole (PROTONIX) 40 MG tablet Take 40 mg by mouth 2 (two) times daily. 10/19/17  Yes [provider]  pioglitazone (ACTOS) 30 MG tablet Take 30 mg by mouth in the morning. 07/27/19  Yes [provider]  rosuvastatin (CRESTOR) 5 MG tablet Take 5 mg by mouth every Monday, Wednesday, and Friday. In the morning 04/19/18  Yes [provider]  tamsulosin (FLOMAX) 0.4 MG CAPS capsule Take 1 capsule (0.4 mg total) by mouth daily. 03/25/21  Yes Vaillancourt, Samantha, PA-C  TOUJEO MAX SOLOSTAR 300 UNIT/ML Solostar Pen Inject 50 Units into the skin in the morning. Patient taking differently: Inject 40 Units into the skin in the morning. 05/13/21  Yes Wieting, Richard, MD  XIGDUO XR 12-998 MG TB24 Take 2 tablets by mouth every morning. 01/31/21  Yes [provider]  zinc sulfate 220 (50 Zn) MG capsule Take 1 capsule (220 mg total) by mouth daily. 05/14/21  Yes Wieting, Richard, MD  aspirin EC 81 MG tablet Take 81 mg by mouth in the morning. Swallow whole. Patient not taking: Reported on 05/30/2021    [provider]  chlorpheniramine-HYDROcodone (TUSSIONEX) 10-8 MG/5ML SUER Take 5 mLs by mouth every 12 (twelve) hours as needed for cough. Patient not taking: No sig reported 05/13/21   Loletha Grayer, MD  feeding supplement, GLUCERNA SHAKE, (GLUCERNA SHAKE) LIQD Take 237 mLs by mouth 2 (two) times daily between meals. 05/13/21   Loletha Grayer, MD  fluticasone-salmeterol (ADVAIR HFA) 709-62 MCG/ACT inhaler Inhale 2 puffs into the lungs 2 (two) times daily.  Rinse mouth out with water after use Patient not taking: No sig reported 05/13/21   Loletha Grayer, MD  Insulin Pen Needle 33G X 4 MM MISC 1 Dose by Does not apply route 3 (three) times daily before meals. 05/13/21   Loletha Grayer, MD  predniSONE (DELTASONE) 10 MG tablet Three tabs po daily for five days Patient not taking: No sig reported 05/13/21   Loletha Grayer, MD     Critical care provider statement:   Total critical care time: 33 minutes   Performed by: Lanney Gins MD   Critical  care time was exclusive of separately billable procedures and treating other patients.   Critical care was necessary to treat or prevent imminent or life-threatening deterioration.   Critical care was time spent personally by me on the following activities: development of treatment plan with patient and/or surrogate as well as nursing, discussions with consultants, evaluation of patient's response to treatment, examination of patient, obtaining history from patient or surrogate, ordering and performing treatments and interventions, ordering and review of laboratory studies, ordering and review of radiographic studies, pulse oximetry and re-evaluation of patient's condition.    Ottie Glazier, M.D.  Pulmonary & Critical Care Medicine

## 2021-06-01 NOTE — Progress Notes (Signed)
Inpatient Diabetes Program Recommendations  AACE/ADA: New Consensus Statement on Inpatient Glycemic Control (2015)  Target Ranges:  Prepandial:   less than 140 mg/dL      Peak postprandial:   less than 180 mg/dL (1-2 hours)      Critically ill patients:  140 - 180 mg/dL   Lab Results  Component Value Date   GLUCAP 227 (H) 06/01/2021   HGBA1C 8.6 (H) 05/09/2021    Review of Glycemic Control Results for KALANI, STHILAIRE (MRN [id number]) as of 06/01/2021 09:46  Ref. Range 05/31/2021 11:22 05/31/2021 13:37 05/31/2021 14:55 05/31/2021 17:42 06/01/2021 07:59  Glucose-Capillary Latest Ref Range: 70 - 99 mg/dL 317 (H) 357 (H) 392 (H) 289 (H) 227 (H)   Diabetes history: DM 2 Home meds:Novolog 8 units tid with meals, Toujeo 40 units daily, Ozempic 1 mg daily, Actos 30 mg daily, Xigduo 12-998 mg  Hospital meds:  Semglee 20 units bid, Novolog moderate tid with meals and HS, Solumedrol 40 mg IV q 12 hours Inpatient Diabetes Program Recommendations:    Consider increasing Semglee to 25 units bid.  Also consider adding Novolog meal coverage 6 units tid with meals (hold if patient eats less than 50%).   Thanks,  Adah Perl, RN, BC-ADM Inpatient Diabetes Coordinator Pager [phone]  (8a-5p)

## 2021-06-01 NOTE — Progress Notes (Signed)
PHARMACY CONSULT NOTE - FOLLOW UP  Pharmacy Consult for Electrolyte Monitoring and Replacement   Recent Labs: Potassium (mmol/L)  Date Value  06/01/2021 4.2   Magnesium (mg/dL)  Date Value  06/01/2021 2.4   Calcium (mg/dL)  Date Value  06/01/2021 8.4 (L)   Albumin (g/dL)  Date Value  05/30/2021 3.1 (L)   Phosphorus (mg/dL)  Date Value  06/01/2021 3.2   Sodium (mmol/L)  Date Value  06/01/2021 138    Assessment: 70 yo male with past medical history of  pulmonary fibrosis, HTN, HDL, DM, obesity, OSA and recent diagnosis of COVID-pneumonia admitted on 05/30/21. Pharmacy consulted to assist in monitoring and replacing electrolytes.  Furosemide 20 mg PO daily   Goal of Therapy:  Electrolytes WNL  Plan:  No replacement indicated at this time Continue to follow along  Tawnya Crook, PharmD, BCPS Clinical Pharmacist 06/01/2021 12:13 PM

## 2021-06-02 LAB — PHOSPHORUS: Phosphorus: 3.2 mg/dL (ref 2.5–4.6)

## 2021-06-02 LAB — LEGIONELLA PNEUMOPHILA SEROGP 1 UR AG: L. pneumophila Serogp 1 Ur Ag: NEGATIVE

## 2021-06-02 LAB — CBC WITH DIFFERENTIAL/PLATELET
Abs Immature Granulocytes: 0.09 10*3/uL — ABNORMAL HIGH (ref 0.00–0.07)
Basophils Absolute: 0 10*3/uL (ref 0.0–0.1)
Basophils Relative: 0 %
Eosinophils Absolute: 0.1 10*3/uL (ref 0.0–0.5)
Eosinophils Relative: 1 %
HCT: 37.9 % — ABNORMAL LOW (ref 39.0–52.0)
Hemoglobin: 12.8 g/dL — ABNORMAL LOW (ref 13.0–17.0)
Immature Granulocytes: 1 %
Lymphocytes Relative: 13 %
Lymphs Abs: 1.2 10*3/uL (ref 0.7–4.0)
MCH: 31.7 pg (ref 26.0–34.0)
MCHC: 33.8 g/dL (ref 30.0–36.0)
MCV: 93.8 fL (ref 80.0–100.0)
Monocytes Absolute: 0.5 10*3/uL (ref 0.1–1.0)
Monocytes Relative: 6 %
Neutro Abs: 6.7 10*3/uL (ref 1.7–7.7)
Neutrophils Relative %: 79 %
Platelets: 349 10*3/uL (ref 150–400)
RBC: 4.04 MIL/uL — ABNORMAL LOW (ref 4.22–5.81)
RDW: 12.6 % (ref 11.5–15.5)
WBC: 8.6 10*3/uL (ref 4.0–10.5)
nRBC: 0 % (ref 0.0–0.2)

## 2021-06-02 LAB — MAGNESIUM: Magnesium: 2.4 mg/dL (ref 1.7–2.4)

## 2021-06-02 LAB — GLUCOSE, CAPILLARY
Glucose-Capillary: 217 mg/dL — ABNORMAL HIGH (ref 70–99)
Glucose-Capillary: 325 mg/dL — ABNORMAL HIGH (ref 70–99)
Glucose-Capillary: 285 mg/dL — ABNORMAL HIGH (ref 70–99)
Glucose-Capillary: 329 mg/dL — ABNORMAL HIGH (ref 70–99)

## 2021-06-02 LAB — BASIC METABOLIC PANEL
Anion gap: 6 (ref 5–15)
BUN: 19 mg/dL (ref 8–23)
CO2: 33 mmol/L — ABNORMAL HIGH (ref 22–32)
Calcium: 8.1 mg/dL — ABNORMAL LOW (ref 8.9–10.3)
Chloride: 98 mmol/L (ref 98–111)
Creatinine, Ser: 0.65 mg/dL (ref 0.61–1.24)
GFR, Estimated: >60 mL/min (ref >60)
Glucose, Bld: 249 mg/dL — ABNORMAL HIGH (ref 70–99)
Potassium: 4.1 mmol/L (ref 3.5–5.1)
Sodium: 137 mmol/L (ref 135–145)

## 2021-06-02 LAB — RHEUMATOID FACTOR: Rheumatoid fact SerPl-aCnc: 10.4 IU/mL (ref <14.0)

## 2021-06-02 LAB — SEDIMENTATION RATE: Sed Rate: 54 mm/hr — ABNORMAL HIGH (ref 0–20)

## 2021-06-02 LAB — FUNGITELL, SERUM: Fungitell Result: <31 pg/mL (ref <80)

## 2021-06-02 LAB — C-REACTIVE PROTEIN: CRP: 3.9 mg/dL — ABNORMAL HIGH (ref <1.0)

## 2021-06-02 MED ORDER — LIDOCAINE HCL (PF) 4 % IJ SOLN
5.0000 mL | INTRAMUSCULAR | Status: DC | PRN
  Administered 2021-06-02: 5 mL via RESPIRATORY_TRACT
  Filled 2021-06-02 (×2): qty 5

## 2021-06-02 MED ORDER — PHENOL 1.4 % MT LIQD
1.0000 | OROMUCOSAL | Status: DC | PRN
  Administered 2021-06-02: 1 via OROMUCOSAL
  Filled 2021-06-02: qty 177

## 2021-06-02 NOTE — Progress Notes (Signed)
Inpatient Diabetes Program Recommendations  AACE/ADA: New Consensus Statement on Inpatient Glycemic Control   Target Ranges:  Prepandial:   less than 140 mg/dL      Peak postprandial:   less than 180 mg/dL (1-2 hours)      Critically ill patients:  140 - 180 mg/dL  Results for Dylan Pittman, Dylan Pittman (MRN [id number]) as of 06/02/2021 12:04  Ref. Range 06/01/2021 07:59 06/01/2021 12:10 06/01/2021 15:56 06/01/2021 21:19 06/02/2021 07:29 06/02/2021 11:30  Glucose-Capillary Latest Ref Range: 70 - 99 mg/dL 227 (H) 333 (H) 378 (H) 365 (H) 217 (H) 325 (H)    Review of Glycemic Control  Diabetes history: DM2 Outpatient Diabetes medications: Novolog 8 units TID with meals, Toujeo 40 units daily, Ozempic 1 mg daily, Actos 30 mg daily, Xigduo 12-998 mg  Current orders for Inpatient glycemic control: Semglee 25 units BID, Novolog 0-15 units TID with meals and at bedtime, Novolog 6 units TID with meals for meal coverage, Solumedrol 40 mg IV Q12 hours   Inpatient Diabetes Program Recommendations:     Insulin: If steroids are continued as ordered, please consider increasing Semglee to 30 units BID and meal coverage to Novolog 10 units TID with meals if patient eats at least 50% of meals.  Thanks, Barnie Alderman, RN, MSN, CDE Diabetes Coordinator Inpatient Diabetes Program [phone] (Team Pager from 8am to 5pm)

## 2021-06-02 NOTE — Progress Notes (Signed)
FiO2 attempted to 45%. Pt desat to mid 3's pt placed back on FiO2 of 50%. Will try again later today.

## 2021-06-02 NOTE — Progress Notes (Signed)
[name] for Electrolyte Monitoring and Replacement   Recent Labs: Potassium (mmol/L)  Date Value  06/02/2021 4.1   Magnesium (mg/dL)  Date Value  06/02/2021 2.4   Calcium (mg/dL)  Date Value  06/02/2021 8.1 (L)   Albumin (g/dL)  Date Value  05/30/2021 3.1 (L)   Phosphorus (mg/dL)  Date Value  06/02/2021 3.2   Sodium (mmol/L)  Date Value  06/02/2021 137    Assessment: 70 yo male with past medical history of  pulmonary fibrosis, HTN, HDL, DM, obesity, OSA and recent diagnosis of COVID-pneumonia admitted on 05/30/21. Pharmacy consulted to assist in monitoring and replacing electrolytes.  Furosemide 20 mg PO daily   Goal of Therapy:  Electrolytes WNL  Plan:  No replacement indicated at this time Continue to follow along  Dylan Pittman, PharmD, BCPS Clinical Pharmacist 06/02/2021 7:10 AM

## 2021-06-02 NOTE — Progress Notes (Signed)
NAME:  Dylan Pittman, MRN:  [id number], DOB:  August 14, 1951, LOS: 3 ADMISSION DATE:  05/30/2021, CONSULTATION DATE:  05/30/21 REFERRING MD:  Dr. Creig Hines, CHIEF COMPLAINT:   Shortness of breath  History of Present Illness:  70 year old male presenting to [facility] ED from home on 05/30/2021 with complaints of progressive hypoxia at rest/with exertion and dyspnea with exertion.  Of note patient was recently hospitalized with COVID-19 pneumonia from 05/09/2021 to 05/13/2021.  He describes that since discharge he now wears continuous oxygen via nasal cannula.  He started at 2 L at rest and 4 L with ambulation, but reports he has had to increase to 5 L nasal cannula at rest which maintains his SPO2 greater than 90%.  He reports with any exertion his SPO2 drops into the 50s to 60%.  Earlier today he took a shower with his wife's assistance his oxygen dropped to 58% and it took 45 minutes for him to recover above 90% with his nasal cannula.  He does admit to feeling dizzy today correlating with his hypoxia, but denies any recent falls or injuries. He does have a CPAP machine to wear at night for his OSA, but admits that since discharge there was equipment difficulties and he has not been able to use it consistently.  He complains of persistent coughing spells with mostly clear phlegm, however earlier today he did notice some red streaks in his sputum.  He denies chest pain, nausea/vomiting/diarrhea/abdominal pain, fever/chills, or sore throat.   ED course: Upon arrival patient's SPO2 was 91% on 7 L however with minimal movement this decreased to low 80's.  Patient's oxygen needs were titrated up to heated high flow nasal cannula 75% and 40 L.  Medications given: Patient received DuoNebs and Solu-Medrol as well as cefepime and vancomycin empirically. Initial Vitals: Afebrile at 98.4, tachypneic at 24, mildly tachycardic at 105, BP stable at 131/60 with SPO2 91% on 7 L nasal cannula. Significant labs: (Labs/ Imaging  personally reviewed) I, Domingo Pulse Rust-Chester, AGACNP-BC, personally viewed and interpreted this ECG. EKG Interpretation: Date: 05/30/2021, EKG Time: 14:44, Rate: 105, Rhythm: Sinus tachycardia, QRS Axis: LAD, Intervals: Short PR interval, possible RAE and LAE, voltage criteria for LVH, ST/T Wave abnormalities: None,  Narrative Interpretation: Sinus tachycardia Chemistry: Na+: 133, K+: 4.6, Cl: 94 BUN/Cr.:  22/0.97, Serum CO2/ AG: 31/8 Hematology: WBC: 8.7, Hgb: 14,  Troponin: 5> 5, BNP: 17.6, Lactic/ PCT: 1.9> 1.9/1.15,  COVID-19 & Influenza A/B: Pending (COVID-19 positive on 05/09/2021) D-Dimer: 2.07  CXR 05/30/2021: Bilateral interstitial and hazy airspace lung opacities without significant change compared to prior chest x-ray consistent with persistent multifocal pneumonia. CT angiogram chest 05/30/2021: No evidence of acute PE.  Persistent extensive widespread areas of predominantly groundglass opacity throughout both lungs.  No significant interval change compared to previous CT. redemonstrated multiple enlarged mediastinal and bilateral hilar lymph nodes which may be reactive.  Background of chronic interstitial lung disease.  PCCM consulted for admission due to patient's high oxygen requirements, making him a risk for intubation.  Pertinent  Medical History  Pulmonary fibrosis Hypertension Hyperlipidemia Type 2 diabetes Obstructive sleep apnea COVID-19 (September 2022) Prostate cancer status post seed ablation and Lupron injections BPH GERD Bronchitis Significant Hospital Events: Including procedures, antibiotic start and stop dates in addition to other pertinent events   05/30/2021: Admit to ICU on heated high flow nasal cannula at 75%/40 L in the secondary to suspected persistent pneumonia from recent COVID-19 infection in the setting of chronic interstitial lung disease 06/01/21-  Patient  is improved, reviewed medical plan with patient and wife 06/02/21-patient weaned to  50%/45L/min continue steroids , repeat blood work.     Objective   Blood pressure 124/78, pulse 85, temperature 98.5 F (36.9 C), temperature source Oral, resp. rate 19, height 5\' 8"  (1.727 m), weight 98.4 kg, SpO2 98 %.    FiO2 (%):  [50 %-60 %] 60 %   Intake/Output Summary (Last 24 hours) at 06/02/2021 0858 Last data filed at 06/02/2021 0445 Gross per 24 hour  Intake 720 ml  Output 2925 ml  Net -2205 ml    Filed Weights   05/30/21 1447 05/31/21 2145 06/01/21 0500  Weight: 113.8 kg 98.4 kg 98.4 kg    Examination: General: Adult male, acutely ill, lying in bed, NAD HEENT: MM pink/moist, anicteric, atraumatic, neck supple Neuro: A&O x 4, able to follow commands, PERRL +3, MAE CV: s1s2 RRR, sinus tachycardia on monitor, no r/m/g Pulm: Regular, mildly labored/dyspneic at rest on HHFNC55% breath sounds coarse/diminished-BUL & diminished-BLL GI: soft, rounded, non tender, bs x 4 Skin: Limited exam-patient still in street clothes> no rashes/lesions noted Extremities: warm/dry, pulses + 2 R/P, trace edema noted BLE  Resolved Hospital Problem list     Assessment & Plan:  Acute Hypoxic Respiratory Failure secondary to suspected pneumonia in the setting of recent COVID-19 infection & pulmonary fibrosis PMHx:COVID-19, OSA, Bronchitis - Continue HHFNC, wean FiO2 as tolerated > may need to transition to BIPAP overnight d/t OSA - Supplemental O2 to maintain SpO2 > 88% - Intermittent chest x-ray & ABG PRN - Ensure adequate pulmonary hygiene: IS Q 1 h x 10 while awake - F/u cultures, Respiratory 20 pathogen panel pending, f/u Strep pna antigen - trend PCT, daily CBC- monitor WBC/fever curve - PCT elevated at 1.15, will continue HCAP coverage: cefepime & Vancomycin - Breo-Ellipta inhaler BID, scheduled Duo-Nebs & bronchodilators PRN - Solu-medrol 40 mg BID - continue home Vitamin C & Zinc  Type 2 Diabetes Mellitus At Risk for Steroid Induced Hyperglycemia - Monitor CBG AC, HS - SSI  moderate dosing, continue home long-acting coverage (solostar 40 units daily) >> Lantus 20 units BID - target range while in ICU: 140-180 - follow ICU hyper/hypo-glycemia protocol - Carb-modified diet in place  BPH - continue home flomax - strict I & O's, monitor for urinary retention  Hyperlipidemia  Hypertension - continue home rosuvastatin - due to SIRS response to suspected Pneumonia with marginal-normal BP, will hold outpatient anti-hypertensives. Consider restarting: Losartan as patient stabilizes - PRN hydralazine if SBP > 160  Best Practice (right click and "Reselect all SmartList Selections" daily)  Diet/type: Regular consistency (see orders) DVT prophylaxis: LMWH GI prophylaxis: PPI Lines: N/A Foley:  N/A Code Status:  full code Last date of multidisciplinary goals of care discussion [05/30/2021]  Labs   CBC: Recent Labs  Lab 05/30/21 1452 05/31/21 0625 06/01/21 0435 06/02/21 0605  WBC 8.7 5.5 10.2 8.6  NEUTROABS  --   --  8.5* 6.7  HGB 14.0 12.6* 11.9* 12.8*  HCT 41.0 37.5* 35.7* 37.9*  MCV 91.7 91.2 92.2 93.8  PLT 315 291 342 349     Basic Metabolic Panel: Recent Labs  Lab 05/30/21 1452 05/31/21 0625 06/01/21 0435 06/02/21 0605  NA 133* 139 138 137  K 4.6 4.4 4.2 4.1  CL 94* 101 101 98  CO2 31 30 30  33*  GLUCOSE 240* 212* 250* 249*  BUN 22 20 20 19   CREATININE 0.97 0.77 0.72 0.65  CALCIUM 8.8* 8.7* 8.4*  8.1*  MG  --  2.3 2.4 2.4  PHOS  --  3.5 3.2 3.2    GFR: Estimated Creatinine Clearance: 100.5 mL/min (by C-G formula based on SCr of 0.65 mg/dL). Recent Labs  Lab 05/30/21 1452 05/30/21 1732 05/31/21 0625 06/01/21 0435 06/02/21 0605  PROCALCITON 1.15  --  0.63 0.40  --   WBC 8.7  --  5.5 10.2 8.6  LATICACIDVEN 1.9 1.9  --   --   --      Liver Function Tests: Recent Labs  Lab 05/30/21 1452  AST 24  ALT 18  ALKPHOS 88  BILITOT 1.2  PROT 7.9  ALBUMIN 3.1*    No results for input(s): LIPASE, AMYLASE in the last 168  hours. No results for input(s): AMMONIA in the last 168 hours.  ABG No results found for: PHART, PCO2ART, PO2ART, HCO3, TCO2, ACIDBASEDEF, O2SAT   Coagulation Profile: No results for input(s): INR, PROTIME in the last 168 hours.  Cardiac Enzymes: No results for input(s): CKTOTAL, CKMB, CKMBINDEX, TROPONINI in the last 168 hours.  HbA1C: Hgb A1c MFr Bld  Date/Time Value Ref Range Status  05/09/2021 06:18 AM 8.6 (H) 4.8 - 5.6 % Final    Comment:    (NOTE) Pre diabetes:          5.7%-6.4%  Diabetes:              >6.4%  Glycemic control for   <7.0% adults with diabetes     CBG: Recent Labs  Lab 06/01/21 0759 06/01/21 1210 06/01/21 1556 06/01/21 2119 06/02/21 0729  GLUCAP 227* 333* 378* 365* 217*     Review of Systems: Positives in BOLD   Gen: Denies fever, chills, weight change, fatigue, night sweats HEENT: Denies blurred vision, double vision, hearing loss, tinnitus, sinus congestion, rhinorrhea, sore throat, neck stiffness, dysphagia PULM: Denies shortness of breath, cough, sputum production, hemoptysis, wheezing CV: Denies chest pain, edema, orthopnea, paroxysmal nocturnal dyspnea, palpitations GI: Denies abdominal pain, nausea, vomiting, diarrhea, hematochezia, melena, constipation, change in bowel habits GU: Denies dysuria, hematuria, polyuria, oliguria, urethral discharge Endocrine: Denies hot or cold intolerance, polyuria, polyphagia or appetite change Derm: Denies rash, dry skin, scaling or peeling skin change Heme: Denies easy bruising, bleeding, bleeding gums Neuro: Denies headache, numbness, weakness, slurred speech, loss of memory or consciousness  Past Medical History:  He,  has a past medical history of Chronic airway obstruction (HCC), DDD (degenerative disc disease), lumbar, Dupuytren's disease, Dyslipidemia (04/07/2014), Esophageal reflux (04/20/2014), Hypertension (04/20/2014), Microalbuminuria (04/07/2014), Microalbuminuria, Obesity, unspecified  (04/07/2014), Sleep apnea (04/20/2014), and 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   Surgical History:   Past Surgical History:  Procedure Laterality Date   BACK SURGERY  1992, 2005   CLOSED REDUCTION FINGER WITH PERCUTANEOUS PINNING Left 02/12/2020   Procedure: Closed reduction and pinning of left first metacarpal fracture with possible open reduction;  Surgeon: Hessie Knows, MD;  Location: [facility] ORS;  Service: Orthopedics;  Laterality: Left;   COLONOSCOPY WITH PROPOFOL N/A 12/28/2017   Procedure: COLONOSCOPY WITH PROPOFOL;  Surgeon: Manya Silvas, MD;  Location: [facility] ENDOSCOPY;  Service: Endoscopy;  Laterality: N/A;   NASAL SEPTUM SURGERY     RADIOACTIVE SEED IMPLANT N/A 04/12/2021   Procedure: RADIOACTIVE SEED IMPLANT/BRACHYTHERAPY IMPLANT;  Surgeon: Abbie Sons, MD;  Location: [facility] ORS;  Service: Urology;  Laterality: N/A;  73 seeds implanted   VASECTOMY  2005     Social History:   reports that he has quit smoking. He has  never used smokeless tobacco. He reports that he does not currently use alcohol. He reports that he does not currently use drugs.   Family History:  His family history is negative for Prostate cancer and Kidney cancer.   Allergies No Known Allergies   Home Medications  Prior to Admission medications   Medication Sig Start Date End Date Taking? Authorizing Provider  albuterol (VENTOLIN HFA) 108 (90 Base) MCG/ACT inhaler Inhale 2 puffs into the lungs every 6 (six) hours as needed for wheezing or shortness of breath. 01/20/20  Yes [provider]  ascorbic acid (VITAMIN C) 1000 MG tablet Take 0.5 tablets (500 mg total) by mouth daily. 05/14/21  Yes Wieting, Richard, MD  cholecalciferol (VITAMIN D) 25 MCG (1000 UNIT) tablet Take 1,000 Units by mouth in the morning.   Yes [provider]  fluticasone-salmeterol (ADVAIR HFA) 115-21 MCG/ACT inhaler Inhale 2 puffs into the lungs 2 (two) times daily.   Yes [provider]  furosemide (LASIX) 20 MG tablet Take 20 mg by mouth daily. 05/26/21  Yes [provider]  gabapentin (NEURONTIN) 300 MG capsule Take 300 mg by mouth 3 (three) times daily. 05/26/21  Yes [provider]  insulin aspart (NOVOLOG) 100 UNIT/ML FlexPen Inject 8 Units into the skin 3 (three) times daily with meals. Okay to substitute generic 05/13/21  Yes Wieting, Richard, MD  losartan (COZAAR) 50 MG tablet Take 50 mg by mouth in the morning. 10/07/17  Yes [provider]  OZEMPIC, 1 MG/DOSE, 4 MG/3ML SOPN Inject 1 mg into the skin every Sunday. 07/29/19  Yes [provider]  pantoprazole (PROTONIX) 40 MG tablet Take 40 mg by mouth 2 (two) times daily. 10/19/17  Yes [provider]  pioglitazone (ACTOS) 30 MG tablet Take 30 mg by mouth in the morning. 07/27/19  Yes [provider]  rosuvastatin (CRESTOR) 5 MG tablet Take 5 mg by mouth every Monday, Wednesday, and Friday. In the morning 04/19/18  Yes [provider]  tamsulosin (FLOMAX) 0.4 MG CAPS capsule Take 1 capsule (0.4 mg total) by mouth daily. 03/25/21  Yes Vaillancourt, Samantha, PA-C  TOUJEO MAX SOLOSTAR 300 UNIT/ML Solostar Pen Inject 50 Units into the skin in the morning. Patient taking differently: Inject 40 Units into the skin in the morning. 05/13/21  Yes Wieting, Richard, MD  XIGDUO XR 12-998 MG TB24 Take 2 tablets by mouth every morning. 01/31/21  Yes [provider]  zinc sulfate 220 (50 Zn) MG capsule Take 1 capsule (220 mg total) by mouth daily. 05/14/21  Yes Wieting, Richard, MD  aspirin EC 81 MG tablet Take 81 mg by mouth in the morning. Swallow whole. Patient not taking: Reported on 05/30/2021    [provider]  chlorpheniramine-HYDROcodone (TUSSIONEX) 10-8 MG/5ML SUER Take 5 mLs by mouth every 12 (twelve) hours as needed for cough. Patient not taking: No sig reported 05/13/21   Loletha Grayer, MD  feeding supplement, GLUCERNA SHAKE, (GLUCERNA  SHAKE) LIQD Take 237 mLs by mouth 2 (two) times daily between meals. 05/13/21   Loletha Grayer, MD  fluticasone-salmeterol (ADVAIR HFA) 237-62 MCG/ACT inhaler Inhale 2 puffs into the lungs 2 (two) times daily. Rinse mouth out with water after use Patient not taking: No sig reported 05/13/21   Loletha Grayer, MD  Insulin Pen Needle 33G X 4 MM MISC 1 Dose by Does not apply route 3 (three) times daily before meals. 05/13/21   Loletha Grayer, MD  predniSONE (DELTASONE) 10 MG tablet Three tabs po daily  for five days Patient not taking: No sig reported 05/13/21   Loletha Grayer, MD     Critical care provider statement:   Total critical care time: 33 minutes   Performed by: Lanney Gins MD   Critical care time was exclusive of separately billable procedures and treating other patients.   Critical care was necessary to treat or prevent imminent or life-threatening deterioration.   Critical care was time spent personally by me on the following activities: development of treatment plan with patient and/or surrogate as well as nursing, discussions with consultants, evaluation of patient's response to treatment, examination of patient, obtaining history from patient or surrogate, ordering and performing treatments and interventions, ordering and review of laboratory studies, ordering and review of radiographic studies, pulse oximetry and re-evaluation of patient's condition.    Ottie Glazier, M.D.  Pulmonary & Critical Care Medicine

## 2021-06-03 LAB — BASIC METABOLIC PANEL
Anion gap: 7 (ref 5–15)
BUN: 16 mg/dL (ref 8–23)
CO2: 34 mmol/L — ABNORMAL HIGH (ref 22–32)
Calcium: 8.5 mg/dL — ABNORMAL LOW (ref 8.9–10.3)
Chloride: 96 mmol/L — ABNORMAL LOW (ref 98–111)
Creatinine, Ser: 0.52 mg/dL — ABNORMAL LOW (ref 0.61–1.24)
GFR, Estimated: >60 mL/min (ref >60)
Glucose, Bld: 259 mg/dL — ABNORMAL HIGH (ref 70–99)
Potassium: 4.2 mmol/L (ref 3.5–5.1)
Sodium: 137 mmol/L (ref 135–145)

## 2021-06-03 LAB — GLUCOSE, CAPILLARY
Glucose-Capillary: 117 mg/dL — ABNORMAL HIGH (ref 70–99)
Glucose-Capillary: 238 mg/dL — ABNORMAL HIGH (ref 70–99)
Glucose-Capillary: 321 mg/dL — ABNORMAL HIGH (ref 70–99)
Glucose-Capillary: 360 mg/dL — ABNORMAL HIGH (ref 70–99)
Glucose-Capillary: 384 mg/dL — ABNORMAL HIGH (ref 70–99)
Glucose-Capillary: 482 mg/dL — ABNORMAL HIGH (ref 70–99)

## 2021-06-03 LAB — CBC
HCT: 37.1 % — ABNORMAL LOW (ref 39.0–52.0)
Hemoglobin: 12.3 g/dL — ABNORMAL LOW (ref 13.0–17.0)
MCH: 30 pg (ref 26.0–34.0)
MCHC: 33.2 g/dL (ref 30.0–36.0)
MCV: 90.5 fL (ref 80.0–100.0)
Platelets: 329 10*3/uL (ref 150–400)
RBC: 4.1 MIL/uL — ABNORMAL LOW (ref 4.22–5.81)
RDW: 12.7 % (ref 11.5–15.5)
WBC: 7.6 10*3/uL (ref 4.0–10.5)
nRBC: 0.3 % — ABNORMAL HIGH (ref 0.0–0.2)

## 2021-06-03 LAB — C-REACTIVE PROTEIN: CRP: 2.2 mg/dL — ABNORMAL HIGH (ref <1.0)

## 2021-06-03 MED ORDER — INSULIN GLARGINE-YFGN 100 UNIT/ML ~~LOC~~ SOLN
30.0000 [IU] | Freq: Two times a day (BID) | SUBCUTANEOUS | Status: DC
  Administered 2021-06-03 – 2021-06-09 (×12): 30 [IU] via SUBCUTANEOUS
  Filled 2021-06-03 (×13): qty 0.3

## 2021-06-03 MED ORDER — INSULIN ASPART 100 UNIT/ML IJ SOLN
0.0000 [IU] | INTRAMUSCULAR | Status: DC
  Administered 2021-06-03: 15 [IU] via SUBCUTANEOUS
  Administered 2021-06-04: 7 [IU] via SUBCUTANEOUS
  Administered 2021-06-04: 15 [IU] via SUBCUTANEOUS
  Administered 2021-06-04: 3 [IU] via SUBCUTANEOUS
  Administered 2021-06-04: 4 [IU] via SUBCUTANEOUS
  Administered 2021-06-04: 7 [IU] via SUBCUTANEOUS
  Filled 2021-06-03 (×5): qty 1

## 2021-06-03 MED ORDER — INSULIN ASPART 100 UNIT/ML IJ SOLN
0.0000 [IU] | Freq: Three times a day (TID) | INTRAMUSCULAR | Status: DC
Start: 2021-06-04 — End: 2021-06-03
  Administered 2021-06-03: 20 [IU] via SUBCUTANEOUS

## 2021-06-03 MED ORDER — METHYLPREDNISOLONE SODIUM SUCC 40 MG IJ SOLR
40.0000 mg | INTRAMUSCULAR | Status: DC
  Administered 2021-06-04 – 2021-06-05 (×2): 40 mg via INTRAVENOUS
  Filled 2021-06-03 (×2): qty 1

## 2021-06-03 MED ORDER — INSULIN ASPART 100 UNIT/ML IJ SOLN: 0.0000 [IU] | Freq: Every day | INTRAMUSCULAR | Status: DC

## 2021-06-03 MED ORDER — AZITHROMYCIN 500 MG PO TABS
500.0000 mg | ORAL_TABLET | Freq: Every day | ORAL | Status: AC
  Administered 2021-06-04 – 2021-06-05 (×2): 500 mg via ORAL
  Filled 2021-06-03 (×2): qty 1

## 2021-06-03 MED ORDER — INSULIN ASPART 100 UNIT/ML IJ SOLN
10.0000 [IU] | Freq: Three times a day (TID) | INTRAMUSCULAR | Status: DC
  Administered 2021-06-03 – 2021-06-04 (×5): 10 [IU] via SUBCUTANEOUS
  Filled 2021-06-03 (×5): qty 1

## 2021-06-03 MED ORDER — INSULIN ASPART 100 UNIT/ML IJ SOLN
10.0000 [IU] | Freq: Once | INTRAMUSCULAR | Status: AC
  Administered 2021-06-03: 10 [IU] via SUBCUTANEOUS
  Filled 2021-06-03: qty 1

## 2021-06-03 NOTE — Progress Notes (Signed)
Inpatient Diabetes Program Recommendations  AACE/ADA: New Consensus Statement on Inpatient Glycemic Control   Target Ranges:  Prepandial:   less than 140 mg/dL      Peak postprandial:   less than 180 mg/dL (1-2 hours)      Critically ill patients:  140 - 180 mg/dL   Results for Dylan Pittman, Dylan Pittman (MRN [id number]) as of 06/03/2021 09:36  Ref. Range 06/02/2021 07:29 06/02/2021 11:30 06/02/2021 16:24 06/02/2021 21:26 06/03/2021 07:21  Glucose-Capillary Latest Ref Range: 70 - 99 mg/dL 217 (H) 325 (H) 285 (H) 329 (H) 238 (H)   Review of Glycemic Control  Diabetes history: DM2 Outpatient Diabetes medications: Novolog 8 units TID with meals, Toujeo 40 units daily, Ozempic 1 mg daily, Actos 30 mg daily, Xigduo 12-998 mg  Current orders for Inpatient glycemic control: Semglee 25 units BID, Novolog 0-15 units TID with meals and at bedtime, Novolog 6 units TID with meals for meal coverage, Solumedrol 40 mg IV Q12 hours   Inpatient Diabetes Program Recommendations:      Insulin: If steroids are continued as ordered, please consider increasing Semglee to 30 units BID and meal coverage to Novolog 10 units TID with meals if patient eats at least 50% of meals.   Thanks, Barnie Alderman, RN, MSN, CDE Diabetes Coordinator Inpatient Diabetes Program [phone] (Team Pager from 8am to 5pm)

## 2021-06-03 NOTE — Progress Notes (Signed)
NAME:  Dylan Pittman, MRN:  [id number], DOB:  Aug 07, 1951, LOS: 4 ADMISSION DATE:  05/30/2021, CONSULTATION DATE:  05/30/21 REFERRING MD:  Dr. Creig Hines, CHIEF COMPLAINT:   Shortness of breath  History of Present Illness:  70 year old male presenting to [facility] ED from home on 05/30/2021 with complaints of progressive hypoxia at rest/with exertion and dyspnea with exertion.  Of note patient was recently hospitalized with COVID-19 pneumonia from 05/09/2021 to 05/13/2021.  He describes that since discharge he now wears continuous oxygen via nasal cannula.  He started at 2 L at rest and 4 L with ambulation, but reports he has had to increase to 5 L nasal cannula at rest which maintains his SPO2 greater than 90%.  He reports with any exertion his SPO2 drops into the 50s to 60%.  Earlier today he took a shower with his wife's assistance his oxygen dropped to 58% and it took 45 minutes for him to recover above 90% with his nasal cannula.  He does admit to feeling dizzy today correlating with his hypoxia, but denies any recent falls or injuries. He does have a CPAP machine to wear at night for his OSA, but admits that since discharge there was equipment difficulties and he has not been able to use it consistently.  He complains of persistent coughing spells with mostly clear phlegm, however earlier today he did notice some red streaks in his sputum.  He denies chest pain, nausea/vomiting/diarrhea/abdominal pain, fever/chills, or sore throat.   ED course: Upon arrival patient's SPO2 was 91% on 7 L however with minimal movement this decreased to low 80's.  Patient's oxygen needs were titrated up to heated high flow nasal cannula 75% and 40 L.  Medications given: Patient received DuoNebs and Solu-Medrol as well as cefepime and vancomycin empirically. Initial Vitals: Afebrile at 98.4, tachypneic at 24, mildly tachycardic at 105, BP stable at 131/60 with SPO2 91% on 7 L nasal cannula. Significant labs: (Labs/ Imaging  personally reviewed) I, Domingo Pulse Rust[place], AGACNP-BC, personally viewed and interpreted this ECG. EKG Interpretation: Date: 05/30/2021, EKG Time: 14:44, Rate: 105, Rhythm: Sinus tachycardia, QRS Axis: LAD, Intervals: Short PR interval, possible RAE and LAE, voltage criteria for LVH, ST/T Wave abnormalities: None,  Narrative Interpretation: Sinus tachycardia Chemistry: Na+: 133, K+: 4.6, Cl: 94 BUN/Cr.:  22/0.97, Serum CO2/ AG: 31/8    Hematology: WBC: 8.7, Hgb: 14,  Troponin: 5> 5, BNP: 17.6, Lactic/ PCT: 1.9> 1.9/1.15,  COVID-19 & Influenza A/B: Pending (COVID-19 positive on 05/09/2021) D-Dimer: 2.07  CXR 05/30/2021: Bilateral interstitial and hazy airspace lung opacities without significant change compared to prior chest x-ray consistent with persistent multifocal pneumonia. CT angiogram chest 05/30/2021: No evidence of acute PE.  Persistent extensive widespread areas of predominantly groundglass opacity throughout both lungs.  No significant interval change compared to previous CT. redemonstrated multiple enlarged mediastinal and bilateral hilar lymph nodes which may be reactive.  Background of chronic interstitial lung disease.  PCCM consulted for admission due to patient's high oxygen requirements, making him a risk for intubation.     Pertinent  Medical History  Pulmonary fibrosis Hypertension Hyperlipidemia Type 2 diabetes Obstructive sleep apnea COVID-19 (September 2022) Prostate cancer status post seed ablation and Lupron injections BPH GERD Bronchitis Significant Hospital Events: Including procedures, antibiotic start and stop dates in addition to other pertinent events   05/30/2021: Admit to ICU on heated high flow nasal cannula at 75%/40 L in the secondary to suspected persistent pneumonia from recent COVID-19 infection in the setting  of chronic interstitial lung disease 06/01/21- Patient  is improved, reviewed medical plan with patient and wife 06/02/21-patient weaned  to 50%/45L/min continue steroids , repeat blood work.  06/03/21- patient improved, met with wife and reviewed care plan today.  Legionella + on ab test but Urine negative suggestive of serotype 2 will order DFA and legionella culture. Increased zithromax to 500 daily    Objective   Blood pressure 131/81, pulse 91, temperature 98.1 F (36.7 C), resp. rate 20, height '5\' 8"'  (1.727 m), weight 98.4 kg, SpO2 95 %.    FiO2 (%):  [40 %-50 %] 40 %   Intake/Output Summary (Last 24 hours) at 06/03/2021 0951 Last data filed at 06/03/2021 2878 Gross per 24 hour  Intake --  Output 4125 ml  Net -4125 ml    Filed Weights   05/30/21 1447 05/31/21 2145 06/01/21 0500  Weight: 113.8 kg 98.4 kg 98.4 kg    Examination: General: Adult male, acutely ill, lying in bed, NAD HEENT: MM pink/moist, anicteric, atraumatic, neck supple Neuro: A&O x 4, able to follow commands, PERRL +3, MAE CV: s1s2 RRR, sinus tachycardia on monitor, no r/m/g Pulm: Regular, mildly labored/dyspneic at rest on HHFNC55% breath sounds coarse/diminished-BUL & diminished-BLL GI: soft, rounded, non tender, bs x 4 Skin: Limited exam-patient still in street clothes> no rashes/lesions noted Extremities: warm/dry, pulses + 2 R/P, trace edema noted BLE  Resolved Hospital Problem list     Assessment & Plan:  Acute Hypoxic Respiratory Failure       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          -Legionella Ab + , UG neg, DFA and legionella culture ordered - Continue HHFNC, wean FiO2 as tolerated > may need to transition to BIPAP overnight d/t OSA - Supplemental O2 to maintain SpO2 > 88% - Intermittent chest x-ray & ABG PRN - Ensure adequate pulmonary hygiene: IS Q 1 h x 10 while awake - F/u cultures, Respiratory 20 pathogen panel pending, f/u Strep pna antigen - trend PCT, daily CBC- monitor WBC/fever curve - PCT elevated at 1.15, will continue HCAP coverage: cefepime & Vancomycin - Breo-Ellipta inhaler  BID, scheduled Duo-Nebs & bronchodilators PRN - Solu-medrol 40 mg BID>>>>once daily  - continue home Vitamin C & Zinc  Type 2 Diabetes Mellitus At Risk for Steroid Induced Hyperglycemia - Monitor CBG AC, HS - SSI moderate dosing, continue home long-acting coverage (solostar 40 units daily) >> Lantus 20 units BID - target range while in ICU: 140-180 - follow ICU hyper/hypo-glycemia protocol - Carb-modified diet in place  BPH - continue home flomax - strict I & O's, monitor for urinary retention  Hyperlipidemia  Hypertension - continue home rosuvastatin - due to SIRS response to suspected Pneumonia with marginal-normal BP, will hold outpatient anti-hypertensives. Consider restarting: Losartan as patient stabilizes - PRN hydralazine if SBP > 160  Best Practice (right click and "Reselect all SmartList Selections" daily)  Diet/type: Regular consistency (see orders) DVT prophylaxis: LMWH GI prophylaxis: PPI Lines: N/A Foley:  N/A Code Status:  full code Last date of multidisciplinary goals of care discussion [05/30/2021]  Labs   CBC: Recent Labs  Lab 05/30/21 1452 05/31/21 0625 06/01/21 0435 06/02/21 0605 06/03/21 0512  WBC 8.7 5.5 10.2 8.6 7.6  NEUTROABS  --   --  8.5* 6.7  --   HGB 14.0 12.6* 11.9* 12.8* 12.3*  HCT 41.0 37.5* 35.7* 37.9* 37.1*  MCV 91.7 91.2 92.2 93.8 90.5  PLT 315 291  342 349 329     Basic Metabolic Panel: Recent Labs  Lab 05/30/21 1452 05/31/21 0625 06/01/21 0435 06/02/21 0605 06/03/21 0512  NA 133* 139 138 137 137  K 4.6 4.4 4.2 4.1 4.2  CL 94* 101 101 98 96*  CO2 '31 30 30 ' 33* 34*  GLUCOSE 240* 212* 250* 249* 259*  BUN '22 20 20 19 16  ' CREATININE 0.97 0.77 0.72 0.65 0.52*  CALCIUM 8.8* 8.7* 8.4* 8.1* 8.5*  MG  --  2.3 2.4 2.4  --   PHOS  --  3.5 3.2 3.2  --     GFR: Estimated Creatinine Clearance: 100.5 mL/min (A) (by C-G formula based on SCr of 0.52 mg/dL (L)). Recent Labs  Lab 05/30/21 1452 05/30/21 1732 05/31/21 0625  06/01/21 0435 06/02/21 0605 06/03/21 0512  PROCALCITON 1.15  --  0.63 0.40  --   --   WBC 8.7  --  5.5 10.2 8.6 7.6  LATICACIDVEN 1.9 1.9  --   --   --   --      Liver Function Tests: Recent Labs  Lab 05/30/21 1452  AST 24  ALT 18  ALKPHOS 88  BILITOT 1.2  PROT 7.9  ALBUMIN 3.1*    No results for input(s): LIPASE, AMYLASE in the last 168 hours. No results for input(s): AMMONIA in the last 168 hours.  ABG No results found for: PHART, PCO2ART, PO2ART, HCO3, TCO2, ACIDBASEDEF, O2SAT   Coagulation Profile: No results for input(s): INR, PROTIME in the last 168 hours.  Cardiac Enzymes: No results for input(s): CKTOTAL, CKMB, CKMBINDEX, TROPONINI in the last 168 hours.  HbA1C: Hgb A1c MFr Bld  Date/Time Value Ref Range Status  05/09/2021 06:18 AM 8.6 (H) 4.8 - 5.6 % Final    Comment:    (NOTE) Pre diabetes:          5.7%-6.4%  Diabetes:              >6.4%  Glycemic control for   <7.0% adults with diabetes     CBG: Recent Labs  Lab 06/02/21 0729 06/02/21 1130 06/02/21 1624 06/02/21 2126 06/03/21 0721  GLUCAP 217* 325* 285* 329* 238*     Review of Systems: Positives in BOLD   Gen: Denies fever, chills, weight change, fatigue, night sweats HEENT: Denies blurred vision, double vision, hearing loss, tinnitus, sinus congestion, rhinorrhea, sore throat, neck stiffness, dysphagia PULM: Denies shortness of breath, cough, sputum production, hemoptysis, wheezing CV: Denies chest pain, edema, orthopnea, paroxysmal nocturnal dyspnea, palpitations GI: Denies abdominal pain, nausea, vomiting, diarrhea, hematochezia, melena, constipation, change in bowel habits GU: Denies dysuria, hematuria, polyuria, oliguria, urethral discharge Endocrine: Denies hot or cold intolerance, polyuria, polyphagia or appetite change Derm: Denies rash, dry skin, scaling or peeling skin change Heme: Denies easy bruising, bleeding, bleeding gums Neuro: Denies headache, numbness, weakness,  slurred speech, loss of memory or consciousness  Past Medical History:  He,  has a past medical history of Chronic airway obstruction (HCC), DDD (degenerative disc disease), lumbar, Dupuytren's disease, Dyslipidemia (04/07/2014), Esophageal reflux (04/20/2014), Hypertension (04/20/2014), Microalbuminuria (04/07/2014), Microalbuminuria, Obesity, unspecified (04/07/2014), Sleep apnea (04/20/2014), and 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   Surgical History:   Past Surgical History:  Procedure Laterality Date   BACK SURGERY  1992, 2005   CLOSED REDUCTION FINGER WITH PERCUTANEOUS PINNING Left 02/12/2020   Procedure: Closed reduction and pinning of left first metacarpal fracture with possible open reduction;  Surgeon: Hessie Knows, MD;  Location: [facility] ORS;  Service: Orthopedics;  Laterality: Left;   COLONOSCOPY WITH PROPOFOL N/A 12/28/2017   Procedure: COLONOSCOPY WITH PROPOFOL;  Surgeon: Manya Silvas, MD;  Location: [facility] ENDOSCOPY;  Service: Endoscopy;  Laterality: N/A;   NASAL SEPTUM SURGERY     RADIOACTIVE SEED IMPLANT N/A 04/12/2021   Procedure: RADIOACTIVE SEED IMPLANT/BRACHYTHERAPY IMPLANT;  Surgeon: Abbie Sons, MD;  Location: [facility] ORS;  Service: Urology;  Laterality: N/A;  73 seeds implanted   VASECTOMY  2005     Social History:   reports that he has quit smoking. He has never used smokeless tobacco. He reports that he does not currently use alcohol. He reports that he does not currently use drugs.   Family History:  His family history is negative for Prostate cancer and Kidney cancer.   Allergies No Known Allergies   Home Medications  Prior to Admission medications   Medication Sig Start Date End Date Taking? Authorizing Provider  albuterol (VENTOLIN HFA) 108 (90 Base) MCG/ACT inhaler Inhale 2 puffs into the lungs every 6 (six) hours as needed for wheezing or shortness of breath. 01/20/20  Yes [provider]  ascorbic acid (VITAMIN  C) 1000 MG tablet Take 0.5 tablets (500 mg total) by mouth daily. 05/14/21  Yes Wieting, Richard, MD  cholecalciferol (VITAMIN D) 25 MCG (1000 UNIT) tablet Take 1,000 Units by mouth in the morning.   Yes [provider]  fluticasone-salmeterol (ADVAIR HFA) 115-21 MCG/ACT inhaler Inhale 2 puffs into the lungs 2 (two) times daily.   Yes [provider]  furosemide (LASIX) 20 MG tablet Take 20 mg by mouth daily. 05/26/21  Yes [provider]  gabapentin (NEURONTIN) 300 MG capsule Take 300 mg by mouth 3 (three) times daily. 05/26/21  Yes [provider]  insulin aspart (NOVOLOG) 100 UNIT/ML FlexPen Inject 8 Units into the skin 3 (three) times daily with meals. Okay to substitute generic 05/13/21  Yes Wieting, Richard, MD  losartan (COZAAR) 50 MG tablet Take 50 mg by mouth in the morning. 10/07/17  Yes [provider]  OZEMPIC, 1 MG/DOSE, 4 MG/3ML SOPN Inject 1 mg into the skin every Sunday. 07/29/19  Yes [provider]  pantoprazole (PROTONIX) 40 MG tablet Take 40 mg by mouth 2 (two) times daily. 10/19/17  Yes [provider]  pioglitazone (ACTOS) 30 MG tablet Take 30 mg by mouth in the morning. 07/27/19  Yes [provider]  rosuvastatin (CRESTOR) 5 MG tablet Take 5 mg by mouth every Monday, Wednesday, and Friday. In the morning 04/19/18  Yes [provider]  tamsulosin (FLOMAX) 0.4 MG CAPS capsule Take 1 capsule (0.4 mg total) by mouth daily. 03/25/21  Yes Vaillancourt, Samantha, PA-C  TOUJEO MAX SOLOSTAR 300 UNIT/ML Solostar Pen Inject 50 Units into the skin in the morning. Patient taking differently: Inject 40 Units into the skin in the morning. 05/13/21  Yes Wieting, Richard, MD  XIGDUO XR 12-998 MG TB24 Take 2 tablets by mouth every morning. 01/31/21  Yes [provider]  zinc sulfate 220 (50 Zn) MG capsule Take 1 capsule (220 mg total) by mouth daily. 05/14/21  Yes Wieting, Richard, MD  aspirin EC 81 MG tablet Take 81 mg  by mouth in the morning. Swallow whole. Patient not taking: Reported on 05/30/2021    [provider]  chlorpheniramine-HYDROcodone (TUSSIONEX) 10-8 MG/5ML SUER Take 5 mLs by mouth every 12 (twelve) hours as needed for cough. Patient not taking: No sig reported 05/13/21   Loletha Grayer, MD  feeding supplement,  GLUCERNA SHAKE, (GLUCERNA SHAKE) LIQD Take 237 mLs by mouth 2 (two) times daily between meals. 05/13/21   Loletha Grayer, MD  fluticasone-salmeterol (ADVAIR HFA) 295-28 MCG/ACT inhaler Inhale 2 puffs into the lungs 2 (two) times daily. Rinse mouth out with water after use Patient not taking: No sig reported 05/13/21   Loletha Grayer, MD  Insulin Pen Needle 33G X 4 MM MISC 1 Dose by Does not apply route 3 (three) times daily before meals. 05/13/21   Loletha Grayer, MD  predniSONE (DELTASONE) 10 MG tablet Three tabs po daily for five days Patient not taking: No sig reported 05/13/21   Loletha Grayer, MD     Critical care provider statement:   Total critical care time: 33 minutes   Performed by: Lanney Gins MD   Critical care time was exclusive of separately billable procedures and treating other patients.   Critical care was necessary to treat or prevent imminent or life-threatening deterioration.   Critical care was time spent personally by me on the following activities: development of treatment plan with patient and/or surrogate as well as nursing, discussions with consultants, evaluation of patient's response to treatment, examination of patient, obtaining history from patient or surrogate, ordering and performing treatments and interventions, ordering and review of laboratory studies, ordering and review of radiographic studies, pulse oximetry and re-evaluation of patient's condition.    Ottie Glazier, M.D.  Pulmonary & Critical Care Medicine

## 2021-06-03 NOTE — Progress Notes (Signed)
Patient states he is having a better day today. He is more hopeful and pleasant. Wife in most of the day for support. Also had large BM today. Blood sugar coverage adjusted per Diabetes Coordinator Mathis Bud RN calculations. Doing better with IS and flutter valve.

## 2021-06-03 NOTE — Progress Notes (Signed)
[place] for Electrolyte Monitoring and Replacement   Recent Labs: Potassium (mmol/L)  Date Value  06/03/2021 4.2   Magnesium (mg/dL)  Date Value  06/02/2021 2.4   Calcium (mg/dL)  Date Value  06/03/2021 8.5 (L)   Albumin (g/dL)  Date Value  05/30/2021 3.1 (L)   Phosphorus (mg/dL)  Date Value  06/02/2021 3.2   Sodium (mmol/L)  Date Value  06/03/2021 137    Assessment: 70 yo male with past medical history of  pulmonary fibrosis, HTN, HDL, DM, obesity, OSA and recent diagnosis of COVID-pneumonia admitted on 05/30/21. Pharmacy consulted to assist in monitoring and replacing electrolytes.  Furosemide 20 mg PO daily   Goal of Therapy:  Electrolytes WNL  Plan:  No replacement indicated at this time Continue to follow along  Tawnya Crook, PharmD, BCPS Clinical Pharmacist 06/03/2021 11:11 AM

## 2021-06-04 ENCOUNTER — Inpatient Hospital Stay (HOSPITAL_COMMUNITY)
Admit: 2021-06-04 | Discharge: 2021-06-04 | Disposition: A | Discharge status: Home / Self Care | Payer: BC Managed Care – PPO | Attending: Pulmonary Disease | Admitting: Pulmonary Disease

## 2021-06-04 DIAGNOSIS — I5021 Acute systolic (congestive) heart failure: Secondary | ICD-10-CM

## 2021-06-04 LAB — CULTURE, BLOOD (SINGLE)
Culture: NO GROWTH
Special Requests: ADEQUATE

## 2021-06-04 LAB — CBC WITH DIFFERENTIAL/PLATELET
Abs Immature Granulocytes: 0.43 10*3/uL — ABNORMAL HIGH (ref 0.00–0.07)
Basophils Absolute: 0.1 10*3/uL (ref 0.0–0.1)
Basophils Relative: 1 %
Eosinophils Absolute: 0.3 10*3/uL (ref 0.0–0.5)
Eosinophils Relative: 3 %
HCT: 39.1 % (ref 39.0–52.0)
Hemoglobin: 13.2 g/dL (ref 13.0–17.0)
Immature Granulocytes: 4 %
Lymphocytes Relative: 25 %
Lymphs Abs: 2.5 10*3/uL (ref 0.7–4.0)
MCH: 31.2 pg (ref 26.0–34.0)
MCHC: 33.8 g/dL (ref 30.0–36.0)
MCV: 92.4 fL (ref 80.0–100.0)
Monocytes Absolute: 0.8 10*3/uL (ref 0.1–1.0)
Monocytes Relative: 8 %
Neutro Abs: 5.9 10*3/uL (ref 1.7–7.7)
Neutrophils Relative %: 59 %
Platelets: 358 10*3/uL (ref 150–400)
RBC: 4.23 MIL/uL (ref 4.22–5.81)
RDW: 12.9 % (ref 11.5–15.5)
WBC: 9.9 10*3/uL (ref 4.0–10.5)
nRBC: 0.4 % — ABNORMAL HIGH (ref 0.0–0.2)

## 2021-06-04 LAB — ECHOCARDIOGRAM COMPLETE
AR max vel: 2.38 cm2
AV Peak grad: 5.9 mmHg
Ao pk vel: 1.21 m/s
Area-P 1/2: 4.96 cm2
Height: 68 in
S' Lateral: 2.63 cm
Weight: 3470.92 oz

## 2021-06-04 LAB — GLUCOSE, CAPILLARY
Glucose-Capillary: 107 mg/dL — ABNORMAL HIGH (ref 70–99)
Glucose-Capillary: 130 mg/dL — ABNORMAL HIGH (ref 70–99)
Glucose-Capillary: 139 mg/dL — ABNORMAL HIGH (ref 70–99)
Glucose-Capillary: 158 mg/dL — ABNORMAL HIGH (ref 70–99)
Glucose-Capillary: 220 mg/dL — ABNORMAL HIGH (ref 70–99)
Glucose-Capillary: 221 mg/dL — ABNORMAL HIGH (ref 70–99)
Glucose-Capillary: 304 mg/dL — ABNORMAL HIGH (ref 70–99)
Glucose-Capillary: 64 mg/dL — ABNORMAL LOW (ref 70–99)

## 2021-06-04 LAB — BASIC METABOLIC PANEL
Anion gap: 7 (ref 5–15)
BUN: 17 mg/dL (ref 8–23)
CO2: 36 mmol/L — ABNORMAL HIGH (ref 22–32)
Calcium: 8.4 mg/dL — ABNORMAL LOW (ref 8.9–10.3)
Chloride: 95 mmol/L — ABNORMAL LOW (ref 98–111)
Creatinine, Ser: 0.54 mg/dL — ABNORMAL LOW (ref 0.61–1.24)
GFR, Estimated: >60 mL/min (ref >60)
Glucose, Bld: 150 mg/dL — ABNORMAL HIGH (ref 70–99)
Potassium: 3.8 mmol/L (ref 3.5–5.1)
Sodium: 138 mmol/L (ref 135–145)

## 2021-06-04 LAB — MAGNESIUM: Magnesium: 2.2 mg/dL (ref 1.7–2.4)

## 2021-06-04 LAB — PHOSPHORUS: Phosphorus: 4.2 mg/dL (ref 2.5–4.6)

## 2021-06-04 MED ORDER — INSULIN ASPART 100 UNIT/ML IJ SOLN
15.0000 [IU] | Freq: Three times a day (TID) | INTRAMUSCULAR | Status: DC
  Administered 2021-06-05 – 2021-06-06 (×4): 15 [IU] via SUBCUTANEOUS
  Filled 2021-06-04 (×4): qty 1

## 2021-06-04 MED ORDER — INSULIN ASPART 100 UNIT/ML IJ SOLN
0.0000 [IU] | Freq: Three times a day (TID) | INTRAMUSCULAR | Status: DC
  Administered 2021-06-05 (×3): 7 [IU] via SUBCUTANEOUS
  Administered 2021-06-05: 4 [IU] via SUBCUTANEOUS
  Administered 2021-06-06 (×2): 7 [IU] via SUBCUTANEOUS
  Administered 2021-06-06: 17:00:00 20 [IU] via SUBCUTANEOUS
  Administered 2021-06-07: 3 [IU] via SUBCUTANEOUS
  Administered 2021-06-07: 22:00:00 4 [IU] via SUBCUTANEOUS
  Administered 2021-06-07: 17:00:00 20 [IU] via SUBCUTANEOUS
  Administered 2021-06-08: 7 [IU] via SUBCUTANEOUS
  Administered 2021-06-08: 08:00:00 4 [IU] via SUBCUTANEOUS
  Administered 2021-06-08: 7 [IU] via SUBCUTANEOUS
  Administered 2021-06-08: 13:00:00 3 [IU] via SUBCUTANEOUS
  Administered 2021-06-09: 11 [IU] via SUBCUTANEOUS
  Administered 2021-06-09: 20 [IU] via SUBCUTANEOUS
  Administered 2021-06-09: 09:00:00 7 [IU] via SUBCUTANEOUS
  Administered 2021-06-10: 17:00:00 15 [IU] via SUBCUTANEOUS
  Administered 2021-06-10: 08:00:00 3 [IU] via SUBCUTANEOUS
  Administered 2021-06-10: 21:00:00 11 [IU] via SUBCUTANEOUS
  Administered 2021-06-10 – 2021-06-11 (×2): 4 [IU] via SUBCUTANEOUS
  Administered 2021-06-11: 11 [IU] via SUBCUTANEOUS
  Administered 2021-06-11 (×2): 7 [IU] via SUBCUTANEOUS
  Administered 2021-06-12: 15 [IU] via SUBCUTANEOUS
  Administered 2021-06-12: 4 [IU] via SUBCUTANEOUS
  Administered 2021-06-12: 13:00:00 3 [IU] via SUBCUTANEOUS
  Administered 2021-06-12: 08:00:00 11 [IU] via SUBCUTANEOUS
  Administered 2021-06-13: 22:00:00 4 [IU] via SUBCUTANEOUS
  Administered 2021-06-13: 7 [IU] via SUBCUTANEOUS
  Administered 2021-06-13: 15 [IU] via SUBCUTANEOUS
  Administered 2021-06-13: 4 [IU] via SUBCUTANEOUS
  Administered 2021-06-14: 22:00:00 3 [IU] via SUBCUTANEOUS
  Administered 2021-06-14: 13:00:00 11 [IU] via SUBCUTANEOUS
  Administered 2021-06-14: 09:00:00 4 [IU] via SUBCUTANEOUS
  Administered 2021-06-14 – 2021-06-15 (×2): 3 [IU] via SUBCUTANEOUS
  Administered 2021-06-15: 4 [IU] via SUBCUTANEOUS
  Administered 2021-06-15 (×2): 3 [IU] via SUBCUTANEOUS
  Administered 2021-06-16: 08:00:00 4 [IU] via SUBCUTANEOUS
  Administered 2021-06-16: 21:00:00 11 [IU] via SUBCUTANEOUS
  Administered 2021-06-16: 15 [IU] via SUBCUTANEOUS
  Administered 2021-06-16 – 2021-06-17 (×2): 4 [IU] via SUBCUTANEOUS
  Administered 2021-06-17: 11 [IU] via SUBCUTANEOUS
  Filled 2021-06-04 (×47): qty 1

## 2021-06-04 NOTE — Progress Notes (Signed)
NAME:  Dylan Pittman, MRN:  [id number], DOB:  [birth year], LOS: 52 ADMISSION DATE:  05/30/2021, CONSULTATION DATE:  05/30/21 REFERRING MD:  Dr. Creig Hines, CHIEF COMPLAINT:   Shortness of breath  History of Present Illness:  70 year old male presenting to [facility] ED from home on 05/30/2021 with complaints of progressive hypoxia at rest/with exertion and dyspnea with exertion.  Of note patient was recently hospitalized with COVID-19 pneumonia from 05/09/2021 to 05/13/2021.  He describes that since discharge he now wears continuous oxygen via nasal cannula.  He started at 2 L at rest and 4 L with ambulation, but reports he has had to increase to 5 L nasal cannula at rest which maintains his SPO2 greater than 90%.  He reports with any exertion his SPO2 drops into the 50s to 60%.  Earlier today he took a shower with his wife's assistance his oxygen dropped to 58% and it took 45 minutes for him to recover above 90% with his nasal cannula.  He does admit to feeling dizzy today correlating with his hypoxia, but denies any recent falls or injuries. He does have a CPAP machine to wear at night for his OSA, but admits that since discharge there was equipment difficulties and he has not been able to use it consistently.  He complains of persistent coughing spells with mostly clear phlegm, however earlier today he did notice some red streaks in his sputum.  He denies chest pain, nausea/vomiting/diarrhea/abdominal pain, fever/chills, or sore throat.   ED course: Upon arrival patient's SPO2 was 91% on 7 L however with minimal movement this decreased to low 80's.  Patient's oxygen needs were titrated up to heated high flow nasal cannula 75% and 40 L.  Medications given: Patient received DuoNebs and Solu-Medrol as well as cefepime and vancomycin empirically. Initial Vitals: Afebrile at 98.4, tachypneic at 24, mildly tachycardic at 105, BP stable at 131/60 with SPO2 91% on 7 L nasal cannula. Significant labs: (Labs/ Imaging  personally reviewed) I, Domingo Pulse Rust[place]ster, AGACNP-BC, personally viewed and interpreted this ECG. EKG Interpretation: Date: 05/30/2021, EKG Time: 14:44, Rate: 105, Rhythm: Sinus tachycardia, QRS Axis: LAD, Intervals: Short PR interval, possible RAE and LAE, voltage criteria for LVH, ST/T Wave abnormalities: None,  Narrative Interpretation: Sinus tachycardia Chemistry: Na+: 133, K+: 4.6, Cl: 94 BUN/Cr.:  22/0.97, Serum CO2/ AG: 31/8    Hematology: WBC: 8.7, Hgb: 14,  Troponin: 5> 5, BNP: 17.6, Lactic/ PCT: 1.9> 1.9/1.15,  COVID-19 & Influenza A/B: Pending (COVID-19 positive on 05/09/2021) D-Dimer: 2.07  CXR 05/30/2021: Bilateral interstitial and hazy airspace lung opacities without significant change compared to prior chest x-ray consistent with persistent multifocal pneumonia. CT angiogram chest 05/30/2021: No evidence of acute PE.  Persistent extensive widespread areas of predominantly groundglass opacity throughout both lungs.  No significant interval change compared to previous CT. redemonstrated multiple enlarged mediastinal and bilateral hilar lymph nodes which may be reactive.  Background of chronic interstitial lung disease.  PCCM consulted for admission due to patient's high oxygen requirements, making him a risk for intubation.     Pertinent  Medical History  Pulmonary fibrosis Hypertension Hyperlipidemia Type 2 diabetes Obstructive sleep apnea COVID-19 (September 2022) Prostate cancer status post seed ablation and Lupron injections BPH GERD Bronchitis Significant Hospital Events: Including procedures, antibiotic start and stop dates in addition to other pertinent events   05/30/2021: Admit to ICU on heated high flow nasal cannula at 75%/40 L in the secondary to suspected persistent pneumonia from recent COVID-19 infection in the setting  of chronic interstitial lung disease 06/01/21- Patient  is improved, reviewed medical plan with patient and wife 06/02/21-patient weaned  to 50%/45L/min continue steroids , repeat blood work.  06/03/21- patient improved, met with wife and reviewed care plan today.  Legionella + on ab test but Urine negative suggestive of serotype 2 will order DFA and legionella culture. Increased zithromax to 500 daily 06/04/21- continue current care plan , patient is improved and weaned to 10L/min [place]    Objective   Blood pressure 134/84, pulse 99, temperature 98.1 F (36.7 C), resp. rate (!) 26, height '5\' 8"'  (1.727 m), weight 99.4 kg, SpO2 94 %.    FiO2 (%):  [40 %-50 %] 50 %   Intake/Output Summary (Last 24 hours) at 06/04/2021 1051 Last data filed at 06/04/2021 0851 Gross per 24 hour  Intake 2440 ml  Output 4525 ml  Net -2085 ml    Filed Weights   05/31/21 2145 06/01/21 0500 06/04/21 0500  Weight: 98.4 kg 98.4 kg 99.4 kg    Examination: General: Adult male, acutely ill, lying in bed, NAD HEENT: MM pink/moist, anicteric, atraumatic, neck supple Neuro: A&O x 4, able to follow commands, PERRL +3, MAE CV: s1s2 RRR, sinus tachycardia on monitor, no r/m/g Pulm: Regular, mildly labored/dyspneic at rest on HHFNC55% breath sounds coarse/diminished-BUL & diminished-BLL GI: soft, rounded, non tender, bs x 4 Skin: Limited exam-patient still in street clothes> no rashes/lesions noted Extremities: warm/dry, pulses + 2 R/P, trace edema noted BLE  Resolved Hospital Problem list     Assessment & Plan:  Acute Hypoxic Respiratory Failure       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          -Legionella Ab + , UG neg, DFA and legionella culture ordered - Continue HHFNC, wean FiO2 as tolerated > may need to transition to BIPAP overnight d/t OSA - Supplemental O2 to maintain SpO2 > 88% - Intermittent chest x-ray & ABG PRN - Ensure adequate pulmonary hygiene: IS Q 1 h x 10 while awake - F/u cultures, Respiratory 20 pathogen panel pending, f/u Strep pna antigen - trend PCT, daily CBC- monitor WBC/fever curve - PCT  elevated at 1.15, will continue HCAP coverage: cefepime & Vancomycin - Breo-Ellipta inhaler BID, scheduled Duo-Nebs & bronchodilators PRN - Solu-medrol 40 mg BID>>>>once daily  - continue home Vitamin C & Zinc  Type 2 Diabetes Mellitus At Risk for Steroid Induced Hyperglycemia - Monitor CBG AC, HS - SSI moderate dosing, continue home long-acting coverage (solostar 40 units daily) >> Lantus 20 units BID - target range while in ICU: 140-180 - follow ICU hyper/hypo-glycemia protocol - Carb-modified diet in place  BPH - continue home flomax - strict I & O's, monitor for urinary retention  Hyperlipidemia  Hypertension - continue home rosuvastatin - due to SIRS response to suspected Pneumonia with marginal-normal BP, will hold outpatient anti-hypertensives. Consider restarting: Losartan as patient stabilizes - PRN hydralazine if SBP > 160  Best Practice (right click and "Reselect all SmartList Selections" daily)  Diet/type: Regular consistency (see orders) DVT prophylaxis: LMWH GI prophylaxis: PPI Lines: N/A Foley:  N/A Code Status:  full code Last date of multidisciplinary goals of care discussion [05/30/2021]  Labs   CBC: Recent Labs  Lab 05/31/21 0625 06/01/21 0435 06/02/21 0605 06/03/21 0512 06/04/21 0441  WBC 5.5 10.2 8.6 7.6 9.9  NEUTROABS  --  8.5* 6.7  --  5.9  HGB 12.6* 11.9* 12.8* 12.3* 13.2  HCT 37.5* 35.7*  37.9* 37.1* 39.1  MCV 91.2 92.2 93.8 90.5 92.4  PLT 291 342 349 329 358     Basic Metabolic Panel: Recent Labs  Lab 05/31/21 0625 06/01/21 0435 06/02/21 0605 06/03/21 0512 06/04/21 0441  NA 139 138 137 137 138  K 4.4 4.2 4.1 4.2 3.8  CL 101 101 98 96* 95*  CO2 30 30 33* 34* 36*  GLUCOSE 212* 250* 249* 259* 150*  BUN '20 20 19 16 17  ' CREATININE 0.77 0.72 0.65 0.52* 0.54*  CALCIUM 8.7* 8.4* 8.1* 8.5* 8.4*  MG 2.3 2.4 2.4  --  2.2  PHOS 3.5 3.2 3.2  --  4.2    GFR: Estimated Creatinine Clearance: 101 mL/min (A) (by C-G formula based on SCr of  0.54 mg/dL (L)). Recent Labs  Lab 05/30/21 1452 05/30/21 1732 05/31/21 0625 06/01/21 0435 06/02/21 0605 06/03/21 0512 06/04/21 0441  PROCALCITON 1.15  --  0.63 0.40  --   --   --   WBC 8.7  --  5.5 10.2 8.6 7.6 9.9  LATICACIDVEN 1.9 1.9  --   --   --   --   --      Liver Function Tests: Recent Labs  Lab 05/30/21 1452  AST 24  ALT 18  ALKPHOS 88  BILITOT 1.2  PROT 7.9  ALBUMIN 3.1*    No results for input(s): LIPASE, AMYLASE in the last 168 hours. No results for input(s): AMMONIA in the last 168 hours.  ABG No results found for: PHART, PCO2ART, PO2ART, HCO3, TCO2, ACIDBASEDEF, O2SAT   Coagulation Profile: No results for input(s): INR, PROTIME in the last 168 hours.  Cardiac Enzymes: No results for input(s): CKTOTAL, CKMB, CKMBINDEX, TROPONINI in the last 168 hours.  HbA1C: Hgb A1c MFr Bld  Date/Time Value Ref Range Status  05/09/2021 06:18 AM 8.6 (H) 4.8 - 5.6 % Final    Comment:    (NOTE) Pre diabetes:          5.7%-6.4%  Diabetes:              >6.4%  Glycemic control for   <7.0% adults with diabetes     CBG: Recent Labs  Lab 06/03/21 2333 06/04/21 0044 06/04/21 0115 06/04/21 0353 06/04/21 0742  GLUCAP 117* 64* 107* 130* 158*     Review of Systems: Positives in BOLD   Gen: Denies fever, chills, weight change, fatigue, night sweats HEENT: Denies blurred vision, double vision, hearing loss, tinnitus, sinus congestion, rhinorrhea, sore throat, neck stiffness, dysphagia PULM: Denies shortness of breath, cough, sputum production, hemoptysis, wheezing CV: Denies chest pain, edema, orthopnea, paroxysmal nocturnal dyspnea, palpitations GI: Denies abdominal pain, nausea, vomiting, diarrhea, hematochezia, melena, constipation, change in bowel habits GU: Denies dysuria, hematuria, polyuria, oliguria, urethral discharge Endocrine: Denies hot or cold intolerance, polyuria, polyphagia or appetite change Derm: Denies rash, dry skin, scaling or peeling skin  change Heme: Denies easy bruising, bleeding, bleeding gums Neuro: Denies headache, numbness, weakness, slurred speech, loss of memory or consciousness  Past Medical History:  He,  has a past medical history of Chronic airway obstruction (HCC), DDD (degenerative disc disease), lumbar, Dupuytren's disease, Dyslipidemia (04/07/2014), Esophageal reflux (04/20/2014), Hypertension (04/20/2014), Microalbuminuria (04/07/2014), Microalbuminuria, Obesity, unspecified (04/07/2014), Sleep apnea (04/20/2014), and 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   Surgical History:   Past Surgical History:  Procedure Laterality Date   BACK SURGERY  1992, 2005   CLOSED REDUCTION FINGER WITH PERCUTANEOUS PINNING Left 02/12/2020   Procedure: Closed reduction and pinning  of left first metacarpal fracture with possible open reduction;  Surgeon: Hessie Knows, MD;  Location: [facility] ORS;  Service: Orthopedics;  Laterality: Left;   COLONOSCOPY WITH PROPOFOL N/A 12/28/2017   Procedure: COLONOSCOPY WITH PROPOFOL;  Surgeon: Manya Silvas, MD;  Location: [facility] ENDOSCOPY;  Service: Endoscopy;  Laterality: N/A;   NASAL SEPTUM SURGERY     RADIOACTIVE SEED IMPLANT N/A 04/12/2021   Procedure: RADIOACTIVE SEED IMPLANT/BRACHYTHERAPY IMPLANT;  Surgeon: Abbie Sons, MD;  Location: [facility] ORS;  Service: Urology;  Laterality: N/A;  73 seeds implanted   VASECTOMY  2005     Social History:   reports that he has quit smoking. He has never used smokeless tobacco. He reports that he does not currently use alcohol. He reports that he does not currently use drugs.   Family History:  His family history is negative for Prostate cancer and Kidney cancer.   Allergies No Known Allergies   Home Medications  Prior to Admission medications   Medication Sig Start Date End Date Taking? Authorizing Provider  albuterol (VENTOLIN HFA) 108 (90 Base) MCG/ACT inhaler Inhale 2 puffs into the lungs every 6 (six) hours as  needed for wheezing or shortness of breath. 01/20/20  Yes [provider]  ascorbic acid (VITAMIN C) 1000 MG tablet Take 0.5 tablets (500 mg total) by mouth daily. 05/14/21  Yes Wieting, Richard, MD  cholecalciferol (VITAMIN D) 25 MCG (1000 UNIT) tablet Take 1,000 Units by mouth in the morning.   Yes [provider]  fluticasone-salmeterol (ADVAIR HFA) 115-21 MCG/ACT inhaler Inhale 2 puffs into the lungs 2 (two) times daily.   Yes [provider]  furosemide (LASIX) 20 MG tablet Take 20 mg by mouth daily. 05/26/21  Yes [provider]  gabapentin (NEURONTIN) 300 MG capsule Take 300 mg by mouth 3 (three) times daily. 05/26/21  Yes [provider]  insulin aspart (NOVOLOG) 100 UNIT/ML FlexPen Inject 8 Units into the skin 3 (three) times daily with meals. Okay to substitute generic 05/13/21  Yes Wieting, Richard, MD  losartan (COZAAR) 50 MG tablet Take 50 mg by mouth in the morning. 10/07/17  Yes [provider]  OZEMPIC, 1 MG/DOSE, 4 MG/3ML SOPN Inject 1 mg into the skin every Sunday. 07/29/19  Yes [provider]  pantoprazole (PROTONIX) 40 MG tablet Take 40 mg by mouth 2 (two) times daily. 10/19/17  Yes [provider]  pioglitazone (ACTOS) 30 MG tablet Take 30 mg by mouth in the morning. 07/27/19  Yes [provider]  rosuvastatin (CRESTOR) 5 MG tablet Take 5 mg by mouth every Monday, Wednesday, and Friday. In the morning 04/19/18  Yes [provider]  tamsulosin (FLOMAX) 0.4 MG CAPS capsule Take 1 capsule (0.4 mg total) by mouth daily. 03/25/21  Yes Vaillancourt, Samantha, PA-C  TOUJEO MAX SOLOSTAR 300 UNIT/ML Solostar Pen Inject 50 Units into the skin in the morning. Patient taking differently: Inject 40 Units into the skin in the morning. 05/13/21  Yes Wieting, Richard, MD  XIGDUO XR 12-998 MG TB24 Take 2 tablets by mouth every morning. 01/31/21  Yes [provider]  zinc sulfate 220 (50 Zn) MG capsule Take 1  capsule (220 mg total) by mouth daily. 05/14/21  Yes Wieting, Richard, MD  aspirin EC 81 MG tablet Take 81 mg by mouth in the morning. Swallow whole. Patient not taking: Reported on 05/30/2021    [provider]  chlorpheniramine-HYDROcodone (TUSSIONEX) 10-8 MG/5ML SUER Take 5 mLs by mouth every 12 (twelve) hours  as needed for cough. Patient not taking: No sig reported 05/13/21   Loletha Grayer, MD  feeding supplement, GLUCERNA SHAKE, (GLUCERNA SHAKE) LIQD Take 237 mLs by mouth 2 (two) times daily between meals. 05/13/21   Loletha Grayer, MD  fluticasone-salmeterol (ADVAIR HFA) 784-78 MCG/ACT inhaler Inhale 2 puffs into the lungs 2 (two) times daily. Rinse mouth out with water after use Patient not taking: No sig reported 05/13/21   Loletha Grayer, MD  Insulin Pen Needle 33G X 4 MM MISC 1 Dose by Does not apply route 3 (three) times daily before meals. 05/13/21   Loletha Grayer, MD  predniSONE (DELTASONE) 10 MG tablet Three tabs po daily for five days Patient not taking: No sig reported 05/13/21   Loletha Grayer, MD     Critical care provider statement:   Total critical care time: 33 minutes   Performed by: Lanney Gins MD   Critical care time was exclusive of separately billable procedures and treating other patients.   Critical care was necessary to treat or prevent imminent or life-threatening deterioration.   Critical care was time spent personally by me on the following activities: development of treatment plan with patient and/or surrogate as well as nursing, discussions with consultants, evaluation of patient's response to treatment, examination of patient, obtaining history from patient or surrogate, ordering and performing treatments and interventions, ordering and review of laboratory studies, ordering and review of radiographic studies, pulse oximetry and re-evaluation of patient's condition.    Ottie Glazier, M.D.  Pulmonary & Critical Care Medicine

## 2021-06-04 NOTE — Progress Notes (Signed)
Patient states, "that he feels funny and wanted to know if my sugar could still be dropping?" FSBS checked and found to be 64. NP notified and 4 oz of juice given per standing hypoglycemia order and patient also consumed a jello cup he had on bedside table. Recheck FSBS after po intake increased to 107. Will continue to monitor and assess

## 2021-06-04 NOTE — Progress Notes (Signed)
Good day. Wife did not feel well so she only visited briefly. Patient walked in hall and sat up in chair. Afebrile.

## 2021-06-04 NOTE — Progress Notes (Signed)
*  PRELIMINARY RESULTS* Echocardiogram 2D Echocardiogram has been performed.  Dylan Pittman 06/04/2021, 7:13 AM

## 2021-06-04 NOTE — Progress Notes (Signed)
Sputum sample collected and sent to lab, awaiting results.

## 2021-06-04 NOTE — Progress Notes (Signed)
[place] for Electrolyte Monitoring and Replacement   Recent Labs: Potassium (mmol/L)  Date Value  06/04/2021 3.8   Magnesium (mg/dL)  Date Value  06/04/2021 2.2   Calcium (mg/dL)  Date Value  06/04/2021 8.4 (L)   Albumin (g/dL)  Date Value  05/30/2021 3.1 (L)   Phosphorus (mg/dL)  Date Value  06/04/2021 4.2   Sodium (mmol/L)  Date Value  06/04/2021 138    Assessment: 70 yo male with past medical history of  pulmonary fibrosis, HTN, HDL, DM, obesity, OSA and recent diagnosis of COVID-pneumonia admitted on 05/30/21. Pharmacy consulted to assist in monitoring and replacing electrolytes.  Furosemide 20 mg PO daily   Goal of Therapy:  Electrolytes WNL  Plan:  No replacement indicated at this time Continue to follow along  Tawnya Crook, PharmD, BCPS Clinical Pharmacist 06/04/2021 7:27 AM

## 2021-06-05 LAB — BASIC METABOLIC PANEL
Anion gap: 9 (ref 5–15)
BUN: 18 mg/dL (ref 8–23)
CO2: 37 mmol/L — ABNORMAL HIGH (ref 22–32)
Calcium: 8.3 mg/dL — ABNORMAL LOW (ref 8.9–10.3)
Chloride: 93 mmol/L — ABNORMAL LOW (ref 98–111)
Creatinine, Ser: 0.45 mg/dL — ABNORMAL LOW (ref 0.61–1.24)
GFR, Estimated: >60 mL/min (ref >60)
Glucose, Bld: 125 mg/dL — ABNORMAL HIGH (ref 70–99)
Potassium: 3.9 mmol/L (ref 3.5–5.1)
Sodium: 139 mmol/L (ref 135–145)

## 2021-06-05 LAB — CBC
HCT: 39.5 % (ref 39.0–52.0)
Hemoglobin: 13.2 g/dL (ref 13.0–17.0)
MCH: 31.2 pg (ref 26.0–34.0)
MCHC: 33.4 g/dL (ref 30.0–36.0)
MCV: 93.4 fL (ref 80.0–100.0)
Platelets: 319 10*3/uL (ref 150–400)
RBC: 4.23 MIL/uL (ref 4.22–5.81)
RDW: 13 % (ref 11.5–15.5)
WBC: 9.8 10*3/uL (ref 4.0–10.5)
nRBC: 0.2 % (ref 0.0–0.2)

## 2021-06-05 LAB — PHOSPHORUS: Phosphorus: 4.6 mg/dL (ref 2.5–4.6)

## 2021-06-05 LAB — GLUCOSE, CAPILLARY
Glucose-Capillary: 156 mg/dL — ABNORMAL HIGH (ref 70–99)
Glucose-Capillary: 224 mg/dL — ABNORMAL HIGH (ref 70–99)
Glucose-Capillary: 239 mg/dL — ABNORMAL HIGH (ref 70–99)
Glucose-Capillary: 242 mg/dL — ABNORMAL HIGH (ref 70–99)

## 2021-06-05 LAB — MAGNESIUM: Magnesium: 2.2 mg/dL (ref 1.7–2.4)

## 2021-06-05 MED ORDER — PREDNISONE 50 MG PO TABS
50.0000 mg | ORAL_TABLET | Freq: Every day | ORAL | Status: DC
  Administered 2021-06-06 – 2021-06-10 (×5): 50 mg via ORAL
  Filled 2021-06-05 (×5): qty 1

## 2021-06-05 NOTE — Progress Notes (Signed)
NAME:  Dylan Pittman, MRN:  [id number], DOB:  April 06, 1951, LOS: 49 ADMISSION DATE:  05/30/2021, CONSULTATION DATE:  05/30/21 REFERRING MD:  Dr. Creig Hines, CHIEF COMPLAINT:   Shortness of breath  History of Present Illness:  70 year old male presenting to [facility] ED from home on 05/30/2021 with complaints of progressive hypoxia at rest/with exertion and dyspnea with exertion.  Of note patient was recently hospitalized with COVID-19 pneumonia from 05/09/2021 to 05/13/2021.  He describes that since discharge he now wears continuous oxygen via nasal cannula.  He started at 2 L at rest and 4 L with ambulation, but reports he has had to increase to 5 L nasal cannula at rest which maintains his SPO2 greater than 90%.  He reports with any exertion his SPO2 drops into the 50s to 60%.  Earlier today he took a shower with his wife's assistance his oxygen dropped to 58% and it took 45 minutes for him to recover above 90% with his nasal cannula.  He does admit to feeling dizzy today correlating with his hypoxia, but denies any recent falls or injuries. He does have a CPAP machine to wear at night for his OSA, but admits that since discharge there was equipment difficulties and he has not been able to use it consistently.  He complains of persistent coughing spells with mostly clear phlegm, however earlier today he did notice some red streaks in his sputum.  He denies chest pain, nausea/vomiting/diarrhea/abdominal pain, fever/chills, or sore throat.      Pertinent  Medical History  Pulmonary fibrosis Hypertension Hyperlipidemia Type 2 diabetes Obstructive sleep apnea COVID-19 (September 2022) Prostate cancer status post seed ablation and Lupron injections BPH GERD Bronchitis Significant Hospital Events: Including procedures, antibiotic start and stop dates in addition to other pertinent events   05/30/2021: Admit to ICU on heated high flow nasal cannula at 75%/40 L in the secondary to suspected persistent pneumonia  from recent COVID-19 infection in the setting of chronic interstitial lung disease 06/01/21- Patient  is improved, reviewed medical plan with patient and wife 06/02/21-patient weaned to 50%/45L/min continue steroids , repeat blood work.  06/03/21- patient improved, met with wife and reviewed care plan today.  Legionella + on ab test but Urine negative suggestive of serotype 2 will order DFA and legionella culture. Increased zithromax to 500 daily 06/04/21- continue current care plan , patient is improved and weaned to 10L/min [place] 06/05/21- patient improved on supplemental O2, met with wife and reviewed care plan. Wil transfer to med surg with tele and start PT/OT   Objective   Blood pressure 123/77, pulse 93, temperature 98.4 F (36.9 C), temperature source Oral, resp. rate 20, height 5' 8" (1.727 m), weight 98.6 kg, SpO2 92 %.        Intake/Output Summary (Last 24 hours) at 06/05/2021 1034 Last data filed at 06/05/2021 0630 Gross per 24 hour  Intake 960 ml  Output 4050 ml  Net -3090 ml    Filed Weights   06/01/21 0500 06/04/21 0500 06/05/21 0500  Weight: 98.4 kg 99.4 kg 98.6 kg    Examination: General: Adult male, acutely ill, lying in bed, NAD HEENT: MM pink/moist, anicteric, atraumatic, neck supple Neuro: A&O x 4, able to follow commands, PERRL +3, MAE CV: s1s2 RRR, sinus tachycardia on monitor, no r/m/g Pulm: Regular, mildly labored/dyspneic at rest on HHFNC55% breath sounds coarse/diminished-BUL & diminished-BLL GI: soft, rounded, non tender, bs x 4 Skin: Limited exam-patient still in street clothes> no rashes/lesions noted Extremities: warm/dry, pulses +  2 R/P, trace edema noted BLE  Resolved Hospital Problem list     Assessment & Plan:  Acute Hypoxic Respiratory Failure       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          -Legionella Ab + , UG neg, DFA and legionella culture ordered - Continue HHFNC, wean FiO2 as tolerated > may need to  transition to BIPAP overnight d/t OSA - Supplemental O2 to maintain SpO2 > 88% - Intermittent chest x-ray & ABG PRN - Ensure adequate pulmonary hygiene: IS Q 1 h x 10 while awake - F/u cultures, Respiratory 20 pathogen panel pending, f/u Strep pna antigen - trend PCT, daily CBC- monitor WBC/fever curve - PCT elevated at 1.15, will continue HCAP coverage: cefepime & Vancomycin - Breo-Ellipta inhaler BID, scheduled Duo-Nebs & bronchodilators PRN - Solu-medrol 40 mg BID>>>>once daily >>Prednisone 62m 10/9 - cxr in am - continue home Vitamin C & Zinc  Type 2 Diabetes Mellitus At Risk for Steroid Induced Hyperglycemia - Monitor CBG AC, HS - SSI moderate dosing, continue home long-acting coverage (solostar 40 units daily) >> Lantus 20 units BID - target range while in ICU: 140-180 - follow ICU hyper/hypo-glycemia protocol - Carb-modified diet in place  BPH - continue home flomax - strict I & O's, monitor for urinary retention  Hyperlipidemia  Hypertension - continue home rosuvastatin - due to SIRS response to suspected Pneumonia with marginal-normal BP, will hold outpatient anti-hypertensives. Consider restarting: Losartan as patient stabilizes - PRN hydralazine if SBP > 160  Best Practice (right click and "Reselect all SmartList Selections" daily)  Diet/type: Regular consistency (see orders) DVT prophylaxis: LMWH GI prophylaxis: PPI Lines: N/A Foley:  N/A Code Status:  full code Last date of multidisciplinary goals of care discussion [05/30/2021]  Labs   CBC: Recent Labs  Lab 06/01/21 0435 06/02/21 0605 06/03/21 0512 06/04/21 0441 06/05/21 0612  WBC 10.2 8.6 7.6 9.9 9.8  NEUTROABS 8.5* 6.7  --  5.9  --   HGB 11.9* 12.8* 12.3* 13.2 13.2  HCT 35.7* 37.9* 37.1* 39.1 39.5  MCV 92.2 93.8 90.5 92.4 93.4  PLT 342 349 329 358 319     Basic Metabolic Panel: Recent Labs  Lab 05/31/21 0625 06/01/21 0435 06/02/21 0605 06/03/21 0512 06/04/21 0441 06/05/21 0612  NA 139  138 137 137 138 139  K 4.4 4.2 4.1 4.2 3.8 3.9  CL 101 101 98 96* 95* 93*  CO2 30 30 33* 34* 36* 37*  GLUCOSE 212* 250* 249* 259* 150* 125*  BUN _0 CREATININE 0.77 0.72 0.65 0.52* 0.54* 0.45*  CALCIUM 8.7* 8.4* 8.1* 8.5* 8.4* 8.3*  MG 2.3 2.4 2.4  --  2.2 2.2  PHOS 3.5 3.2 3.2  --  4.2 4.6    GFR: Estimated Creatinine Clearance: 100.6 mL/min (A) (by C-G formula based on SCr of 0.45 mg/dL (L)). Recent Labs  Lab 05/30/21 1452 05/30/21 1732 05/31/21 0625 06/01/21 0435 06/02/21 0605 06/03/21 0512 06/04/21 0441 06/05/21 0612  PROCALCITON 1.15  --  0.63 0.40  --   --   --   --   WBC 8.7  --  5.5 10.2 8.6 7.6 9.9 9.8  LATICACIDVEN 1.9 1.9  --   --   --   --   --   --      Liver Function Tests: Recent Labs  Lab 05/30/21 1452  AST 24  ALT 18  ALKPHOS 88  BILITOT 1.2  PROT 7.9  ALBUMIN 3.1*    No results for input(s): LIPASE, AMYLASE in the last 168 hours. No results for input(s): AMMONIA in the last 168 hours.  ABG No results found for: PHART, PCO2ART, PO2ART, HCO3, TCO2, ACIDBASEDEF, O2SAT   Coagulation Profile: No results for input(s): INR, PROTIME in the last 168 hours.  Cardiac Enzymes: No results for input(s): CKTOTAL, CKMB, CKMBINDEX, TROPONINI in the last 168 hours.  HbA1C: Hgb A1c MFr Bld  Date/Time Value Ref Range Status  05/09/2021 06:18 AM 8.6 (H) 4.8 - 5.6 % Final    Comment:    (NOTE) Pre diabetes:          5.7%-6.4%  Diabetes:              >6.4%  Glycemic control for   <7.0% adults with diabetes     CBG: Recent Labs  Lab 06/04/21 1136 06/04/21 1541 06/04/21 2025 06/04/21 2258 06/05/21 0742  GLUCAP 221* 304* 220* 139* 156*     Review of Systems: Positives in BOLD   Gen: Denies fever, chills, weight change, fatigue, night sweats HEENT: Denies blurred vision, double vision, hearing loss, tinnitus, sinus congestion, rhinorrhea, sore throat, neck stiffness, dysphagia PULM: Denies shortness of breath, cough, sputum  production, hemoptysis, wheezing CV: Denies chest pain, edema, orthopnea, paroxysmal nocturnal dyspnea, palpitations GI: Denies abdominal pain, nausea, vomiting, diarrhea, hematochezia, melena, constipation, change in bowel habits GU: Denies dysuria, hematuria, polyuria, oliguria, urethral discharge Endocrine: Denies hot or cold intolerance, polyuria, polyphagia or appetite change Derm: Denies rash, dry skin, scaling or peeling skin change Heme: Denies easy bruising, bleeding, bleeding gums Neuro: Denies headache, numbness, weakness, slurred speech, loss of memory or consciousness  Past Medical History:  He,  has a past medical history of Chronic airway obstruction (HCC), DDD (degenerative disc disease), lumbar, Dupuytren's disease, Dyslipidemia (04/07/2014), Esophageal reflux (04/20/2014), Hypertension (04/20/2014), Microalbuminuria (04/07/2014), Microalbuminuria, Obesity, unspecified (04/07/2014), Sleep apnea (04/20/2014), and 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   Surgical History:   Past Surgical History:  Procedure Laterality Date   BACK SURGERY  1992, 2005   CLOSED REDUCTION FINGER WITH PERCUTANEOUS PINNING Left 02/12/2020   Procedure: Closed reduction and pinning of left first metacarpal fracture with possible open reduction;  Surgeon: Hessie Knows, MD;  Location: [facility] ORS;  Service: Orthopedics;  Laterality: Left;   COLONOSCOPY WITH PROPOFOL N/A 12/28/2017   Procedure: COLONOSCOPY WITH PROPOFOL;  Surgeon: Manya Silvas, MD;  Location: [facility] ENDOSCOPY;  Service: Endoscopy;  Laterality: N/A;   NASAL SEPTUM SURGERY     RADIOACTIVE SEED IMPLANT N/A 04/12/2021   Procedure: RADIOACTIVE SEED IMPLANT/BRACHYTHERAPY IMPLANT;  Surgeon: Abbie Sons, MD;  Location: [facility] ORS;  Service: Urology;  Laterality: N/A;  73 seeds implanted   VASECTOMY  2005     Social History:   reports that he has quit smoking. He has never used smokeless tobacco. He reports that  he does not currently use alcohol. He reports that he does not currently use drugs.   Family History:  His family history is negative for Prostate cancer and Kidney cancer.   Allergies No Known Allergies   Home Medications  Prior to Admission medications   Medication Sig Start Date End Date Taking? Authorizing Provider  albuterol (VENTOLIN HFA) 108 (90 Base) MCG/ACT inhaler Inhale 2 puffs into the lungs every 6 (six) hours as needed for wheezing or shortness of breath. 01/20/20  Yes [provider]  ascorbic acid (VITAMIN C) 1000 MG tablet  Take 0.5 tablets (500 mg total) by mouth daily. 05/14/21  Yes Wieting, Richard, MD  cholecalciferol (VITAMIN D) 25 MCG (1000 UNIT) tablet Take 1,000 Units by mouth in the morning.   Yes [provider]  fluticasone-salmeterol (ADVAIR HFA) 115-21 MCG/ACT inhaler Inhale 2 puffs into the lungs 2 (two) times daily.   Yes [provider]  furosemide (LASIX) 20 MG tablet Take 20 mg by mouth daily. 05/26/21  Yes [provider]  gabapentin (NEURONTIN) 300 MG capsule Take 300 mg by mouth 3 (three) times daily. 05/26/21  Yes [provider]  insulin aspart (NOVOLOG) 100 UNIT/ML FlexPen Inject 8 Units into the skin 3 (three) times daily with meals. Okay to substitute generic 05/13/21  Yes Wieting, Richard, MD  losartan (COZAAR) 50 MG tablet Take 50 mg by mouth in the morning. 10/07/17  Yes [provider]  OZEMPIC, 1 MG/DOSE, 4 MG/3ML SOPN Inject 1 mg into the skin every Sunday. 07/29/19  Yes [provider]  pantoprazole (PROTONIX) 40 MG tablet Take 40 mg by mouth 2 (two) times daily. 10/19/17  Yes [provider]  pioglitazone (ACTOS) 30 MG tablet Take 30 mg by mouth in the morning. 07/27/19  Yes [provider]  rosuvastatin (CRESTOR) 5 MG tablet Take 5 mg by mouth every Monday, Wednesday, and Friday. In the morning 04/19/18  Yes [provider]  tamsulosin (FLOMAX) 0.4 MG CAPS capsule  Take 1 capsule (0.4 mg total) by mouth daily. 03/25/21  Yes Vaillancourt, Samantha, PA-C  TOUJEO MAX SOLOSTAR 300 UNIT/ML Solostar Pen Inject 50 Units into the skin in the morning. Patient taking differently: Inject 40 Units into the skin in the morning. 05/13/21  Yes Wieting, Richard, MD  XIGDUO XR 12-998 MG TB24 Take 2 tablets by mouth every morning. 01/31/21  Yes [provider]  zinc sulfate 220 (50 Zn) MG capsule Take 1 capsule (220 mg total) by mouth daily. 05/14/21  Yes Wieting, Richard, MD  aspirin EC 81 MG tablet Take 81 mg by mouth in the morning. Swallow whole. Patient not taking: Reported on 05/30/2021    [provider]  chlorpheniramine-HYDROcodone (TUSSIONEX) 10-8 MG/5ML SUER Take 5 mLs by mouth every 12 (twelve) hours as needed for cough. Patient not taking: No sig reported 05/13/21   Loletha Grayer, MD  feeding supplement, GLUCERNA SHAKE, (GLUCERNA SHAKE) LIQD Take 237 mLs by mouth 2 (two) times daily between meals. 05/13/21   Loletha Grayer, MD  fluticasone-salmeterol (ADVAIR HFA) 016-55 MCG/ACT inhaler Inhale 2 puffs into the lungs 2 (two) times daily. Rinse mouth out with water after use Patient not taking: No sig reported 05/13/21   Loletha Grayer, MD  Insulin Pen Needle 33G X 4 MM MISC 1 Dose by Does not apply route 3 (three) times daily before meals. 05/13/21   Loletha Grayer, MD  predniSONE (DELTASONE) 10 MG tablet Three tabs po daily for five days Patient not taking: No sig reported 05/13/21   Loletha Grayer, MD     Critical care provider statement:   Total critical care time: 33 minutes   Performed by: Lanney Gins MD   Critical care time was exclusive of separately billable procedures and treating other patients.   Critical care was necessary to treat or prevent imminent or life-threatening deterioration.   Critical care was time spent personally by me on the following activities: development of treatment plan with patient and/or surrogate as well  as nursing, discussions with consultants, evaluation of patient's response to treatment, examination of patient, obtaining  history from patient or surrogate, ordering and performing treatments and interventions, ordering and review of laboratory studies, ordering and review of radiographic studies, pulse oximetry and re-evaluation of patient's condition.    Ottie Glazier, M.D.  Pulmonary & Critical Care Medicine

## 2021-06-05 NOTE — Progress Notes (Signed)
[place] for Electrolyte Monitoring and Replacement   Recent Labs: Potassium (mmol/L)  Date Value  06/05/2021 3.9   Magnesium (mg/dL)  Date Value  06/05/2021 2.2   Calcium (mg/dL)  Date Value  06/05/2021 8.3 (L)   Albumin (g/dL)  Date Value  05/30/2021 3.1 (L)   Phosphorus (mg/dL)  Date Value  06/05/2021 4.6   Sodium (mmol/L)  Date Value  06/05/2021 139    Assessment: 70 yo male with past medical history of  pulmonary fibrosis, HTN, HDL, DM, obesity, OSA and recent diagnosis of COVID-pneumonia admitted on 05/30/21. Pharmacy consulted to assist in monitoring and replacing electrolytes.  Furosemide 20 mg PO daily   Goal of Therapy:  Electrolytes WNL  Plan:  No replacement indicated at this time Continue to follow along  Tawnya Crook, PharmD, BCPS Clinical Pharmacist 06/05/2021 7:39 AM

## 2021-06-05 NOTE — Progress Notes (Signed)
PCCM pick up morning of 10/10  70 yo M with hx of pulmonary fibrosis with recent COVID-19 infection in September discharged on 2L who presented on 05/30/2021 with  acute on chronic hypoxic respiratory failure secondary to post-COVID ILD and acute exacerbation of ILD. Admitted on 75%/40L HFNC.  Legionella antibody positive but urine antigen negative with culture pending. On azithromycin.  Has been weaned down to 10L HHFNC and clinically able to do PT/OT so PCCM requested transfer to [facility] Hospitalist service.   Also has hx of Type 2 DM, HTN, HLD, BPH.

## 2021-06-05 NOTE — Progress Notes (Addendum)
More alert and interactive. Up to [facility] for BM. Tolerates activity well. States feels better today. Family in to visit. Very support.

## 2021-06-05 NOTE — Progress Notes (Signed)
Patient transported to room 120 via bed. Patient denies pain. VSS. Patient belongings sent with patient.

## 2021-06-06 ENCOUNTER — Inpatient Hospital Stay: Payer: BC Managed Care – PPO

## 2021-06-06 ENCOUNTER — Encounter: Payer: Self-pay | Admitting: Pulmonary Disease

## 2021-06-06 DIAGNOSIS — J849 Interstitial pulmonary disease, unspecified: Secondary | ICD-10-CM

## 2021-06-06 DIAGNOSIS — J9621 Acute and chronic respiratory failure with hypoxia: Secondary | ICD-10-CM | POA: Diagnosis not present

## 2021-06-06 LAB — BASIC METABOLIC PANEL
Anion gap: 5 (ref 5–15)
BUN: 18 mg/dL (ref 8–23)
CO2: 38 mmol/L — ABNORMAL HIGH (ref 22–32)
Calcium: 8.3 mg/dL — ABNORMAL LOW (ref 8.9–10.3)
Chloride: 93 mmol/L — ABNORMAL LOW (ref 98–111)
Creatinine, Ser: 0.72 mg/dL (ref 0.61–1.24)
GFR, Estimated: >60 mL/min (ref >60)
Glucose, Bld: 242 mg/dL — ABNORMAL HIGH (ref 70–99)
Potassium: 4.1 mmol/L (ref 3.5–5.1)
Sodium: 136 mmol/L (ref 135–145)

## 2021-06-06 LAB — CBC
HCT: 40.8 % (ref 39.0–52.0)
Hemoglobin: 13.2 g/dL (ref 13.0–17.0)
MCH: 30.1 pg (ref 26.0–34.0)
MCHC: 32.4 g/dL (ref 30.0–36.0)
MCV: 93.2 fL (ref 80.0–100.0)
Platelets: 328 10*3/uL (ref 150–400)
RBC: 4.38 MIL/uL (ref 4.22–5.81)
RDW: 12.8 % (ref 11.5–15.5)
WBC: 10.2 10*3/uL (ref 4.0–10.5)
nRBC: 0 % (ref 0.0–0.2)

## 2021-06-06 LAB — MAGNESIUM: Magnesium: 2.3 mg/dL (ref 1.7–2.4)

## 2021-06-06 LAB — GLUCOSE, CAPILLARY
Glucose-Capillary: 132 mg/dL — ABNORMAL HIGH (ref 70–99)
Glucose-Capillary: 217 mg/dL — ABNORMAL HIGH (ref 70–99)
Glucose-Capillary: 430 mg/dL — ABNORMAL HIGH (ref 70–99)
Glucose-Capillary: 237 mg/dL — ABNORMAL HIGH (ref 70–99)

## 2021-06-06 LAB — PHOSPHORUS: Phosphorus: 4.6 mg/dL (ref 2.5–4.6)

## 2021-06-06 MED ORDER — BACITRACIN-NEOMYCIN-POLYMYXIN 400-5-5000 EX OINT
TOPICAL_OINTMENT | Freq: Two times a day (BID) | CUTANEOUS | Status: DC
  Filled 2021-06-06 (×24): qty 1

## 2021-06-06 MED ORDER — AZITHROMYCIN 500 MG PO TABS
500.0000 mg | ORAL_TABLET | Freq: Every day | ORAL | Status: DC
  Administered 2021-06-06 – 2021-06-09 (×4): 500 mg via ORAL
  Filled 2021-06-06 (×4): qty 1

## 2021-06-06 MED ORDER — LEVOFLOXACIN 750 MG PO TABS: 750.0000 mg | ORAL_TABLET | Freq: Every day | ORAL | Status: DC

## 2021-06-06 MED ORDER — ACETAMINOPHEN 500 MG PO TABS: 500.0000 mg | ORAL_TABLET | ORAL | Status: DC | PRN

## 2021-06-06 MED ORDER — INSULIN ASPART 100 UNIT/ML IJ SOLN
12.0000 [IU] | Freq: Once | INTRAMUSCULAR | Status: AC
  Administered 2021-06-06: 09:00:00 12 [IU] via SUBCUTANEOUS
  Filled 2021-06-06: qty 1

## 2021-06-06 MED ORDER — INSULIN ASPART 100 UNIT/ML IJ SOLN
17.0000 [IU] | Freq: Three times a day (TID) | INTRAMUSCULAR | Status: DC
  Administered 2021-06-07 – 2021-06-09 (×7): 17 [IU] via SUBCUTANEOUS
  Filled 2021-06-06 (×7): qty 1

## 2021-06-06 MED ORDER — HYDROCOD POLST-CPM POLST ER 10-8 MG/5ML PO SUER
5.0000 mL | ORAL | Status: DC
  Administered 2021-06-06 – 2021-06-13 (×41): 5 mL via ORAL
  Filled 2021-06-06 (×42): qty 5

## 2021-06-06 MED ORDER — ACETYLCYSTEINE 20 % IN SOLN
4.0000 mL | Freq: Two times a day (BID) | RESPIRATORY_TRACT | Status: DC
  Administered 2021-06-06 – 2021-06-11 (×10): 4 mL via RESPIRATORY_TRACT
  Filled 2021-06-06 (×10): qty 4

## 2021-06-06 NOTE — TOC Progression Note (Signed)
Transition of Care [facility]) - Progression Note    Patient Details  Name: Dylan Pittman MRN: [id number] Date of Birth: 06/21/1951  Transition of Care [facility]) CM/SW [place], RN Phone Number: 06/06/2021, 2:04 PM  Clinical Narrative:   Patient and spouse in room, state no concerns with returning home after discharge.    Patient has home Oxygen with [employer], no issues.  Requested humidifier, Zach from [employer] notified.  Patient does not have home health currently, or any other DME.  No DME recommended.  TOC will follow to discharge.    Expected Discharge Plan: [place] Barriers to Discharge: Continued Medical Work up  Expected Discharge Plan and Services Expected Discharge Plan: [place]   Discharge Planning Services: CM Consult Post Acute Care Choice: [place] arrangements for the past 2 months: Single Family Home                 DME Arranged:  (No DME recommended, has home o2 with adapt, no issues)                     Social Determinants of Health (SDOH) Interventions    Readmission Risk Interventions No flowsheet data found.

## 2021-06-06 NOTE — Progress Notes (Signed)
   06/06/21 0752  Assess: MEWS Score  Temp 98.4 F (36.9 C)  BP 127/80  Pulse Rate 81  Resp (!) 29  SpO2 100 %  O2 Device HFNC  Assess: MEWS Score  MEWS Temp 0  MEWS Systolic 0  MEWS Pulse 0  MEWS RR 2  MEWS LOC 0  MEWS Score 2  MEWS Score Color Yellow  Assess: if the MEWS score is Yellow or Red  Were vital signs taken at a resting state? Yes  Focused Assessment No change from prior assessment  Does the patient meet 2 or more of the SIRS criteria? No  MEWS guidelines implemented *See Row Information* Yes  Treat  MEWS Interventions Other (Comment) (assessed)  Pain Scale 0-10  Pain Score 0  Take Vital Signs  Increase Vital Sign Frequency  Yellow: Q 2hr X 2 then Q 4hr X 2, if remains yellow, continue Q 4hrs  Escalate  MEWS: Escalate Yellow: discuss with charge nurse/RN and consider discussing with provider and RRT  Notify: Charge Nurse/RN  Name of Charge Nurse/RN Notified Debi Pinkerton  Date Charge Nurse/RN Notified 06/06/21  Time Charge Nurse/RN Notified 0813  Document  Patient Outcome Other (Comment) (continue monitoring)  Progress note created (see row info) Yes  Assess: SIRS CRITERIA  SIRS Temperature  0  SIRS Pulse 0  SIRS Respirations  1  SIRS WBC 0  SIRS Score Sum  1

## 2021-06-06 NOTE — Progress Notes (Signed)
[name] for Electrolyte Monitoring and Replacement   Recent Labs: Potassium (mmol/L)  Date Value  06/06/2021 4.1   Magnesium (mg/dL)  Date Value  06/06/2021 2.3   Calcium (mg/dL)  Date Value  06/06/2021 8.3 (L)   Albumin (g/dL)  Date Value  05/30/2021 3.1 (L)   Phosphorus (mg/dL)  Date Value  06/06/2021 4.6   Sodium (mmol/L)  Date Value  06/06/2021 136    Assessment: 70 yo male with past medical history of  pulmonary fibrosis, HTN, HDL, DM, obesity, OSA and recent diagnosis of COVID-pneumonia admitted on 05/30/21. Pharmacy consulted to assist in monitoring and replacing electrolytes.  Corrected calcium at 9.0, all other electrolytes remain within normal limits.  Goal of Therapy:  Electrolytes WNL  Plan:  No replacement indicated at this time Continue to follow up and replace electrolytes as needed  Dylan Pittman PharmD, BCPS 06/06/2021 8:34 AM

## 2021-06-06 NOTE — Progress Notes (Signed)
NAME:  Dylan Pittman, MRN:  [id number], DOB:  [birth year], LOS: 85 ADMISSION DATE:  05/30/2021, CONSULTATION DATE:  05/30/21 REFERRING MD:  Dr. Creig Hines, CHIEF COMPLAINT:   Shortness of breath  History of Present Illness:  70 year old male presenting to [facility] ED from home on 05/30/2021 with complaints of progressive hypoxia at rest/with exertion and dyspnea with exertion.  Of note patient was recently hospitalized with COVID-19 pneumonia from 05/09/2021 to 05/13/2021.  He describes that since discharge he now wears continuous oxygen via nasal cannula.  He started at 2 L at rest and 4 L with ambulation, but reports he has had to increase to 5 L nasal cannula at rest which maintains his SPO2 greater than 90%.  He reports with any exertion his SPO2 drops into the 50s to 60%.  Earlier today he took a shower with his wife's assistance his oxygen dropped to 58% and it took 45 minutes for him to recover above 90% with his nasal cannula.  He does admit to feeling dizzy today correlating with his hypoxia, but denies any recent falls or injuries. He does have a CPAP machine to wear at night for his OSA, but admits that since discharge there was equipment difficulties and he has not been able to use it consistently.  He complains of persistent coughing spells with mostly clear phlegm, however earlier today he did notice some red streaks in his sputum.  He denies chest pain, nausea/vomiting/diarrhea/abdominal pain, fever/chills, or sore throat.      Pertinent  Medical History  Pulmonary fibrosis Hypertension Hyperlipidemia Type 2 diabetes Obstructive sleep apnea COVID-19 (September 2022) Prostate cancer status post seed ablation and Lupron injections BPH GERD Bronchitis Significant Hospital Events: Including procedures, antibiotic start and stop dates in addition to other pertinent events   05/30/2021: Admit to ICU on heated high flow nasal cannula at 75%/40 L in the secondary to suspected persistent pneumonia  from recent COVID-19 infection in the setting of chronic interstitial lung disease 06/01/21- Patient  is improved, reviewed medical plan with patient and wife 06/02/21-patient weaned to 50%/45L/min continue steroids , repeat blood work.  06/03/21- patient improved, met with wife and reviewed care plan today.  Legionella + on ab test but Urine negative suggestive of serotype 2 will order DFA and legionella culture. Increased zithromax to 500 daily 06/04/21- continue current care plan , patient is improved and weaned to 10L/min [place] 06/05/21- patient improved on supplemental O2, met with wife and reviewed care plan. Wil transfer to med surg with tele and start PT/OT 06/06/21- Patient is slightly improved, he is still coughing a lot and states the codiene cough suryp helps but not lasting but two hours so I have increased frequncely.  He still has high amount of phlegm on expectoration but inspissated unable to expectorate.  We have initiated mucomyst therapy today. Continue PT/OT.  Reviewed care plan with RT Beverlee Nims.  Met with friend and pastor of patient by name of Edmon Crape.  Objective   Blood pressure 127/80, pulse 81, temperature 98.4 F (36.9 C), resp. rate (!) 29, height _0  (1.727 m), weight 100.6 kg, SpO2 92 %.    FiO2 (%):  [50 %] 50 %   Intake/Output Summary (Last 24 hours) at 06/06/2021 1024 Last data filed at 06/06/2021 0948 Gross per 24 hour  Intake 480 ml  Output 3275 ml  Net -2795 ml    Filed Weights   06/04/21 0500 06/05/21 0500 06/06/21 0258  Weight: 99.4 kg 98.6 kg 100.6  kg    Examination: General: Adult male, acutely ill, lying in bed, NAD HEENT: MM pink/moist, anicteric, atraumatic, neck supple Neuro: A&O x 4, able to follow commands, PERRL +3, MAE CV: s1s2 RRR, sinus tachycardia on monitor, no r/m/g Pulm: Regular, mildly labored/dyspneic at rest on HHFNC55% breath sounds coarse/diminished-BUL & diminished-BLL GI: soft, rounded, non tender, bs x 4 Skin: Limited  exam-patient still in street clothes> no rashes/lesions noted Extremities: warm/dry, pulses + 2 R/P, trace edema noted BLE  Resolved Hospital Problem list     Assessment & Plan:  Acute Hypoxic Respiratory Failure       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          -Legionella Ab + , UG neg, DFA and legionella culture ordered - Continue HHFNC, wean FiO2 as tolerated > may need to transition to BIPAP overnight d/t OSA - Supplemental O2 to maintain SpO2 > 88% - Intermittent chest x-ray & ABG PRN - Ensure adequate pulmonary hygiene: IS Q 1 h x 10 while awake - F/u cultures, Respiratory 20 pathogen panel pending, f/u Strep pna antigen - trend PCT, daily CBC- monitor WBC/fever curve - PCT elevated at 1.15, will continue HCAP coverage: cefepime & Vancomycin - Breo-Ellipta inhaler BID, scheduled Duo-Nebs & bronchodilators PRN - Solu-medrol 40 mg BID>>>>once daily >>Prednisone 69m 10/9 - cxr in am reviewed with patient 06/06/21 - continue home Vitamin C & Zinc  Type 2 Diabetes Mellitus At Risk for Steroid Induced Hyperglycemia - Monitor CBG AC, HS - SSI moderate dosing, continue home long-acting coverage (solostar 40 units daily) >> Lantus 20 units BID - target range while in ICU: 140-180 - follow ICU hyper/hypo-glycemia protocol - Carb-modified diet in place  BPH - continue home flomax - strict I & O's, monitor for urinary retention  Hyperlipidemia  Hypertension - continue home rosuvastatin - due to SIRS response to suspected Pneumonia with marginal-normal BP, will hold outpatient anti-hypertensives. Consider restarting: Losartan as patient stabilizes - PRN hydralazine if SBP > 160  Best Practice (right click and "Reselect all SmartList Selections" daily)  Diet/type: Regular consistency (see orders) DVT prophylaxis: LMWH GI prophylaxis: PPI Lines: N/A Foley:  N/A Code Status:  full code Last date of multidisciplinary goals of care discussion  [05/30/2021]  IMAGING   Recent Labs  Lab 06/01/21 0435 06/02/21 0605 06/03/21 0512 06/04/21 0441 06/05/21 0612 06/06/21 0449  WBC 10.2 8.6 7.6 9.9 9.8 10.2  NEUTROABS 8.5* 6.7  --  5.9  --   --   HGB 11.9* 12.8* 12.3* 13.2 13.2 13.2  HCT 35.7* 37.9* 37.1* 39.1 39.5 40.8  MCV 92.2 93.8 90.5 92.4 93.4 93.2  PLT 342 349 329 358 319 328     Basic Metabolic Panel: Recent Labs  Lab 06/01/21 0435 06/02/21 0605 06/03/21 0512 06/04/21 0441 06/05/21 0612 06/06/21 0449  NA 138 137 137 138 139 136  K 4.2 4.1 4.2 3.8 3.9 4.1  CL 101 98 96* 95* 93* 93*  CO2 30 33* 34* 36* 37* 38*  GLUCOSE 250* 249* 259* 150* 125* 242*  BUN _0 CREATININE 0.72 0.65 0.52* 0.54* 0.45* 0.72  CALCIUM 8.4* 8.1* 8.5* 8.4* 8.3* 8.3*  MG 2.4 2.4  --  2.2 2.2 2.3  PHOS 3.2 3.2  --  4.2 4.6 4.6    GFR: Estimated Creatinine Clearance: 101.6 mL/min (by C-G formula based on SCr of 0.72 mg/dL). Recent Labs  Lab 05/30/21 1452 05/30/21 1732 05/31/21 07858  06/01/21 0435 06/02/21 1572 06/03/21 0512 06/04/21 0441 06/05/21 0612 06/06/21 0449  PROCALCITON 1.15  --  0.63 0.40  --   --   --   --   --   WBC 8.7  --  5.5 10.2   < > 7.6 9.9 9.8 10.2  LATICACIDVEN 1.9 1.9  --   --   --   --   --   --   --    < > = values in this interval not displayed.     Liver Function Tests: Recent Labs  Lab 05/30/21 1452  AST 24  ALT 18  ALKPHOS 88  BILITOT 1.2  PROT 7.9  ALBUMIN 3.1*    No results for input(s): LIPASE, AMYLASE in the last 168 hours. No results for input(s): AMMONIA in the last 168 hours.  ABG No results found for: PHART, PCO2ART, PO2ART, HCO3, TCO2, ACIDBASEDEF, O2SAT   Coagulation Profile: No results for input(s): INR, PROTIME in the last 168 hours.  Cardiac Enzymes: No results for input(s): CKTOTAL, CKMB, CKMBINDEX, TROPONINI in the last 168 hours.  HbA1C: Hgb A1c MFr Bld  Date/Time Value Ref Range Status  05/09/2021 06:18 AM 8.6 (H) 4.8 - 5.6 % Final    Comment:     (NOTE) Pre diabetes:          5.7%-6.4%  Diabetes:              >6.4%  Glycemic control for   <7.0% adults with diabetes     CBG: Recent Labs  Lab 06/05/21 0742 06/05/21 1123 06/05/21 1629 06/05/21 2138 06/06/21 0754  GLUCAP 156* 224* 239* 242* 132*     Review of Systems: Positives in BOLD   Gen: Denies fever, chills, weight change, fatigue, night sweats HEENT: Denies blurred vision, double vision, hearing loss, tinnitus, sinus congestion, rhinorrhea, sore throat, neck stiffness, dysphagia PULM: Denies shortness of breath, cough, sputum production, hemoptysis, wheezing CV: Denies chest pain, edema, orthopnea, paroxysmal nocturnal dyspnea, palpitations GI: Denies abdominal pain, nausea, vomiting, diarrhea, hematochezia, melena, constipation, change in bowel habits GU: Denies dysuria, hematuria, polyuria, oliguria, urethral discharge Endocrine: Denies hot or cold intolerance, polyuria, polyphagia or appetite change Derm: Denies rash, dry skin, scaling or peeling skin change Heme: Denies easy bruising, bleeding, bleeding gums Neuro: Denies headache, numbness, weakness, slurred speech, loss of memory or consciousness  Past Medical History:  He,  has a past medical history of Chronic airway obstruction (HCC), DDD (degenerative disc disease), lumbar, Dupuytren's disease, Dyslipidemia (04/07/2014), Esophageal reflux (04/20/2014), Hypertension (04/20/2014), Microalbuminuria (04/07/2014), Microalbuminuria, Obesity, unspecified (04/07/2014), Sleep apnea (04/20/2014), and 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   Surgical History:   Past Surgical History:  Procedure Laterality Date   BACK SURGERY  1992, 2005   CLOSED REDUCTION FINGER WITH PERCUTANEOUS PINNING Left 02/12/2020   Procedure: Closed reduction and pinning of left first metacarpal fracture with possible open reduction;  Surgeon: Hessie Knows, MD;  Location: [facility] ORS;  Service: Orthopedics;   Laterality: Left;   COLONOSCOPY WITH PROPOFOL N/A 12/28/2017   Procedure: COLONOSCOPY WITH PROPOFOL;  Surgeon: Manya Silvas, MD;  Location: [facility]COPY;  Service: Endoscopy;  Laterality: N/A;   NASAL SEPTUM SURGERY     RADIOACTIVE SEED IMPLANT N/A 04/12/2021   Procedure: RADIOACTIVE SEED IMPLANT/BRACHYTHERAPY IMPLANT;  Surgeon: Abbie Sons, MD;  Location: [facility] ORS;  Service: Urology;  Laterality: N/A;  73 seeds implanted   VASECTOMY  2005     Social History:   reports that he has  quit smoking. He has never used smokeless tobacco. He reports that he does not currently use alcohol. He reports that he does not currently use drugs.   Family History:  His family history is negative for Prostate cancer and Kidney cancer.   Allergies No Known Allergies   Home Medications  Prior to Admission medications   Medication Sig Start Date End Date Taking? Authorizing Provider  albuterol (VENTOLIN HFA) 108 (90 Base) MCG/ACT inhaler Inhale 2 puffs into the lungs every 6 (six) hours as needed for wheezing or shortness of breath. 01/20/20  Yes [provider]  ascorbic acid (VITAMIN C) 1000 MG tablet Take 0.5 tablets (500 mg total) by mouth daily. 05/14/21  Yes Wieting, Richard, MD  cholecalciferol (VITAMIN D) 25 MCG (1000 UNIT) tablet Take 1,000 Units by mouth in the morning.   Yes [provider]  fluticasone-salmeterol (ADVAIR HFA) 115-21 MCG/ACT inhaler Inhale 2 puffs into the lungs 2 (two) times daily.   Yes [provider]  furosemide (LASIX) 20 MG tablet Take 20 mg by mouth daily. 05/26/21  Yes [provider]  gabapentin (NEURONTIN) 300 MG capsule Take 300 mg by mouth 3 (three) times daily. 05/26/21  Yes [provider]  insulin aspart (NOVOLOG) 100 UNIT/ML FlexPen Inject 8 Units into the skin 3 (three) times daily with meals. Okay to substitute generic 05/13/21  Yes Wieting, Richard, MD  losartan (COZAAR) 50 MG tablet Take 50 mg by mouth in the  morning. 10/07/17  Yes [provider]  OZEMPIC, 1 MG/DOSE, 4 MG/3ML SOPN Inject 1 mg into the skin every Sunday. 07/29/19  Yes [provider]  pantoprazole (PROTONIX) 40 MG tablet Take 40 mg by mouth 2 (two) times daily. 10/19/17  Yes [provider]  pioglitazone (ACTOS) 30 MG tablet Take 30 mg by mouth in the morning. 07/27/19  Yes [provider]  rosuvastatin (CRESTOR) 5 MG tablet Take 5 mg by mouth every Monday, Wednesday, and Friday. In the morning 04/19/18  Yes [provider]  tamsulosin (FLOMAX) 0.4 MG CAPS capsule Take 1 capsule (0.4 mg total) by mouth daily. 03/25/21  Yes Vaillancourt, Samantha, PA-C  TOUJEO MAX SOLOSTAR 300 UNIT/ML Solostar Pen Inject 50 Units into the skin in the morning. Patient taking differently: Inject 40 Units into the skin in the morning. 05/13/21  Yes Wieting, Richard, MD  XIGDUO XR 12-998 MG TB24 Take 2 tablets by mouth every morning. 01/31/21  Yes [provider]  zinc sulfate 220 (50 Zn) MG capsule Take 1 capsule (220 mg total) by mouth daily. 05/14/21  Yes Wieting, Richard, MD  aspirin EC 81 MG tablet Take 81 mg by mouth in the morning. Swallow whole. Patient not taking: Reported on 05/30/2021    [provider]  chlorpheniramine-HYDROcodone (TUSSIONEX) 10-8 MG/5ML SUER Take 5 mLs by mouth every 12 (twelve) hours as needed for cough. Patient not taking: No sig reported 05/13/21   Loletha Grayer, MD  feeding supplement, GLUCERNA SHAKE, (GLUCERNA SHAKE) LIQD Take 237 mLs by mouth 2 (two) times daily between meals. 05/13/21   Loletha Grayer, MD  fluticasone-salmeterol (ADVAIR HFA) 814-48 MCG/ACT inhaler Inhale 2 puffs into the lungs 2 (two) times daily. Rinse mouth out with water after use Patient not taking: No sig reported 05/13/21   Loletha Grayer, MD  Insulin Pen Needle 33G X 4 MM MISC 1 Dose by Does not apply route 3 (three) times daily before meals. 05/13/21   Loletha Grayer, MD  predniSONE  (DELTASONE) 10 MG tablet  Three tabs po daily for five days Patient not taking: No sig reported 05/13/21   Loletha Grayer, MD     Critical care provider statement:   Total critical care time: 33 minutes   Performed by: Lanney Gins MD   Critical care time was exclusive of separately billable procedures and treating other patients.   Critical care was necessary to treat or prevent imminent or life-threatening deterioration.   Critical care was time spent personally by me on the following activities: development of treatment plan with patient and/or surrogate as well as nursing, discussions with consultants, evaluation of patient's response to treatment, examination of patient, obtaining history from patient or surrogate, ordering and performing treatments and interventions, ordering and review of laboratory studies, ordering and review of radiographic studies, pulse oximetry and re-evaluation of patient's condition.    Ottie Glazier, M.D.  Pulmonary & Critical Care Medicine

## 2021-06-06 NOTE — Progress Notes (Signed)
Dylan Pittman  UEK:[id number] DOB: 19-Sep-1950 DOA: 05/30/2021 PCP: Sofie Hartigan, MD    Brief Narrative:  70 year old with a history of pulmonary fibrosis, HTN, HLD, DM2, obstructive sleep apnea, prostate cancer status post seed ablation and Lupron, BPH, and GERD who was admitted after presenting to the [facility] ED from home 10/3 with progressive hypoxia at rest and severe dyspnea on exertion.  The patient had been hospitalized with COVID-pneumonia 912 >05/13/2021.  He stated that his dyspnea only worsened after being discharged home with increasing oxygen support requirements each day.  He noted his saturations dropping into the 50-60% range with exertion.  Significant Events:  10/3 admit via ED - required [place] 10/6 weaning of [place]  10/8 weaned to nasal cannula at 10 L [place]   Consultants:  PCCM  Code Status: FULL CODE  Antimicrobials:  Azithromycin 10/5 > 10/9 Cefepime 10/3 > 10/4 Vancomycin 10/3 > 10/5  DVT prophylaxis: Lovenox  Subjective: Afebrile.  Tachycardic with heart rate 106-111.  Blood pressure stable.  Requiring 10 L [place] high flow support.  States he feels "about the same" as yesterday.  Feels he is "very slowly making some progress."  Assessment & Plan:  Acute on chronic hypoxic respiratory failure - Post CoViD ILD / Pulm Fibrosis - ILD w/ acute exacerbation  Care per pulmonary -weaning oxygen as able -weaning steroids  DM2 CBG variable at present -monitor without change in treatment  BPH Continue usual home medical therapy  HLD Continue usual home medical therapy  HTN Blood pressure currently well controlled  Family Communication: No family present at time of exam Status is: Inpatient  Remains inpatient appropriate because:Inpatient level of care appropriate due to severity of illness  Dispo: The patient is from: Home              Anticipated d/c is to: Home              Patient currently is not medically stable to d/c.   Difficult to place  patient No  Objective: Blood pressure 132/75, pulse (!) 107, temperature 98.6 F (37 C), resp. rate (!) 26, height 5\' 8"  (1.727 m), weight 100.6 kg, SpO2 93 %.  Intake/Output Summary (Last 24 hours) at 06/06/2021 1045 Last data filed at 06/06/2021 0948 Gross per 24 hour  Intake 480 ml  Output 3275 ml  Net -2795 ml   Filed Weights   06/04/21 0500 06/05/21 0500 06/06/21 0258  Weight: 99.4 kg 98.6 kg 100.6 kg    Examination: General: No acute respiratory distress Lungs: Poor air movement diffusely with very fine faint appreciated throughout all fields with no wheezing Cardiovascular: Regular rate and rhythm without murmur gallop or rub normal S1 and S2 Abdomen: Nontender, nondistended, soft, bowel sounds positive, no rebound, no ascites, no appreciable mass Extremities: No significant cyanosis, clubbing, or edema bilateral lower extremities  CBC: Recent Labs  Lab 06/01/21 0435 06/02/21 0605 06/03/21 0512 06/04/21 0441 06/05/21 0612 06/06/21 0449  WBC 10.2 8.6   < > 9.9 9.8 10.2  NEUTROABS 8.5* 6.7  --  5.9  --   --   HGB 11.9* 12.8*   < > 13.2 13.2 13.2  HCT 35.7* 37.9*   < > 39.1 39.5 40.8  MCV 92.2 93.8   < > 92.4 93.4 93.2  PLT 342 349   < > 358 319 328   < > = values in this interval not displayed.   Basic Metabolic Panel: Recent Labs  Lab 06/04/21 0441 06/05/21 1505  06/06/21 0449  NA 138 139 136  K 3.8 3.9 4.1  CL 95* 93* 93*  CO2 36* 37* 38*  GLUCOSE 150* 125* 242*  BUN 17 18 18   CREATININE 0.54* 0.45* 0.72  CALCIUM 8.4* 8.3* 8.3*  MG 2.2 2.2 2.3  PHOS 4.2 4.6 4.6   GFR: Estimated Creatinine Clearance: 101.6 mL/min (by C-G formula based on SCr of 0.72 mg/dL).  Liver Function Tests: Recent Labs  Lab 05/30/21 1452  AST 24  ALT 18  ALKPHOS 88  BILITOT 1.2  PROT 7.9  ALBUMIN 3.1*    HbA1C: Hgb A1c MFr Bld  Date/Time Value Ref Range Status  05/09/2021 06:18 AM 8.6 (H) 4.8 - 5.6 % Final    Comment:    (NOTE) Pre diabetes:           5.7%-6.4%  Diabetes:              >6.4%  Glycemic control for   <7.0% adults with diabetes     CBG: Recent Labs  Lab 06/05/21 0742 06/05/21 1123 06/05/21 1629 06/05/21 2138 06/06/21 0754  GLUCAP 156* 224* 239* 242* 132*    Recent Results (from the past 240 hour(s))  Blood culture (single)     Status: None   Collection Time: 05/30/21  2:52 PM   Specimen: BLOOD  Result Value Ref Range Status   Specimen Description BLOOD LEFT ANTECUBITAL  Final   Special Requests   Final    BOTTLES DRAWN AEROBIC AND ANAEROBIC Blood Culture adequate volume   Culture   Final    NO GROWTH 5 DAYS Performed at [facility], [address] [place], [place] [address]    Report Status 06/04/2021 FINAL  Final  Respiratory (~20 pathogens) panel by PCR     Status: None   Collection Time: 05/30/21  5:32 PM   Specimen: Nasopharyngeal Swab; Respiratory  Result Value Ref Range Status   Adenovirus NOT DETECTED NOT DETECTED Final   Coronavirus 229E NOT DETECTED NOT DETECTED Final    Comment: (NOTE) The Coronavirus on the Respiratory Panel, DOES NOT test for the novel  Coronavirus (2019 nCoV)    Coronavirus HKU1 NOT DETECTED NOT DETECTED Final   Coronavirus NL63 NOT DETECTED NOT DETECTED Final   Coronavirus OC43 NOT DETECTED NOT DETECTED Final   Metapneumovirus NOT DETECTED NOT DETECTED Final   Rhinovirus / Enterovirus NOT DETECTED NOT DETECTED Final   Influenza A NOT DETECTED NOT DETECTED Final   Influenza B NOT DETECTED NOT DETECTED Final   Parainfluenza Virus 1 NOT DETECTED NOT DETECTED Final   Parainfluenza Virus 2 NOT DETECTED NOT DETECTED Final   Parainfluenza Virus 3 NOT DETECTED NOT DETECTED Final   Parainfluenza Virus 4 NOT DETECTED NOT DETECTED Final   Respiratory Syncytial Virus NOT DETECTED NOT DETECTED Final   Bordetella pertussis NOT DETECTED NOT DETECTED Final   Bordetella Parapertussis NOT DETECTED NOT DETECTED Final   Chlamydophila pneumoniae NOT DETECTED NOT DETECTED  Final   Mycoplasma pneumoniae NOT DETECTED NOT DETECTED Final    Comment: Performed at [facility], [address], [place], [place] [address]  MRSA Next Gen by PCR, Nasal     Status: None   Collection Time: 05/31/21  2:36 PM   Specimen: Nasal Mucosa; Nasal Swab  Result Value Ref Range Status   MRSA by PCR Next Gen NOT DETECTED NOT DETECTED Final    Comment: (NOTE) The GeneXpert MRSA Assay (FDA approved for NASAL specimens only), is one component of a  comprehensive MRSA colonization surveillance program. It is not intended to diagnose MRSA infection nor to guide or monitor treatment for MRSA infections. Test performance is not FDA approved in patients less than 31 years old. Performed at [facility], [address] [place],  [address]      Scheduled Meds:  acetylcysteine  4 mL Nebulization BID   ascorbic acid  500 mg Oral Daily   azithromycin  500 mg Oral Daily   Chlorhexidine Gluconate Cloth  6 each Topical Q0600   chlorpheniramine-HYDROcodone  5 mL Oral Q4H   enoxaparin (LOVENOX) injection  0.5 mg/kg Subcutaneous Q24H   fluticasone furoate-vilanterol  1 puff Inhalation Daily   furosemide  20 mg Oral Daily   gabapentin  300 mg Oral TID   insulin aspart  0-20 Units Subcutaneous TID AC & HS   insulin aspart  15 Units Subcutaneous TID WC   insulin glargine-yfgn  30 Units Subcutaneous BID   ipratropium-albuterol  3 mL Nebulization Q6H   levofloxacin  750 mg Oral Daily   pantoprazole  40 mg Oral BID   predniSONE  50 mg Oral Q breakfast   rosuvastatin  5 mg Oral Q M,W,F   tamsulosin  0.4 mg Oral Daily   zinc sulfate  220 mg Oral Daily      LOS: 7 days   Cherene Altes, MD [facility] Office  [phone] Pager - Text Page per Shea Evans  If 7PM-7AM, please contact night-coverage per Amion 06/06/2021, 10:45 AM

## 2021-06-06 NOTE — Progress Notes (Addendum)
Occupational Therapy Evaluation Patient Details Name: LAVANTE TOSO MRN: [id number] DOB: 1950/09/23 Today's Date: 06/06/2021   History of Present Illness Patient is a 70 year old male presenting to [facility] ED from home on 05/30/2021 with complaints of progressive hypoxia at rest/with exertion and dyspnea with exertion.  Of note patient was recently hospitalized with COVID-19 pneumonia from 05/09/2021 to 05/13/2021.   Clinical Impression   Mr. Pascua was seen for OT evaluation this date. Prior to hospital admission, pt was independent. Pt lives with wife and  children. Pt currently MOD I for LBD, sitting EOB. MOD I for grooming, standing sinkside. Increased time and O2 required. VCs required for line mgmt and ECS, pursed lip breathing. At rest, no coughing SpO2 low 90s, 10L, HFNC. Desat mid 42s with coughing fit on 10L [place] resolved to high 80s with rest and prolonged PLB. Upon hospital discharge, no further OT services recommended, will sign off. All education completed, pt with good family support, all questions answered.       Recommendations for follow up therapy are one component of a multi-disciplinary discharge planning process, led by the attending physician.  Recommendations may be updated based on patient status, additional functional criteria and insurance authorization.   Follow Up Recommendations  No OT follow up    Equipment Recommendations  None recommended by OT    Recommendations for Other Services       Precautions / Restrictions Precautions Precautions: Fall Restrictions Weight Bearing Restrictions: No      Mobility Bed Mobility Overal bed mobility: Modified Independent                  Transfers Overall transfer level: Modified independent Equipment used: None                  Balance Overall balance assessment: Modified Independent Sitting-balance support: Feet supported Sitting balance-Leahy Scale: Normal     Standing balance support: No  upper extremity supported;During functional activity Standing balance-Leahy Scale: Good                             ADL either performed or assessed with clinical judgement   ADL Overall ADL's : Modified independent                                       General ADL Comments: MOD I for LBD, sitting EOB. MOD I for grooming, standing sinkside. Increased time and O2 required. VCs required for line mgmt and ECS, pursed lip breathing.      Pertinent Vitals/Pain Pain Assessment: No/denies pain     Hand Dominance     Extremity/Trunk Assessment Upper Extremity Assessment Upper Extremity Assessment: Overall WFL for tasks assessed   Lower Extremity Assessment Lower Extremity Assessment: Overall WFL for tasks assessed       Communication Communication Communication: No difficulties   Cognition Arousal/Alertness: Awake/alert Behavior During Therapy: WFL for tasks assessed/performed Overall Cognitive Status: Within Functional Limits for tasks assessed                                 General Comments: patient is alert, cooperative, and follows all commands without difficulty   General Comments  At rest, no coughing SpO2 low 90s, 10L, HFNC. Desat mid 26s with coughing fit, resolved to high 80s with  rest and prolonged PLB, 10L HFNC.    Exercises Exercises: Other exercises Other Exercises Other Exercises: Pt educ re: OT role, ECS, falls precaution, d/c recs Other Exercises: Supine<>sit, simulated LBD, sit<>stand, toothbrushing standing sinkside, ambulation, simulated household mgmt task   Shoulder Instructions      Home Living Family/patient expects to be discharged to:: Private residence Living Arrangements: Spouse/significant other;Children Available Help at Discharge: Family Type of Home: House Home Access: Other (comment) (one step)     Home Layout: One level     Bathroom Shower/Tub: Tub only   Biochemist, clinical: Standard      Home Equipment: Walker - 2 wheels;Bedside commode          Prior Functioning/Environment Level of Independence: Independent                 OT Problem List:        OT Treatment/Interventions:      OT Goals(Current goals can be found in the care plan section) Acute Rehab OT Goals Patient Stated Goal: to go home  OT Frequency:      AM-PAC OT "6 Clicks" Daily Activity     Outcome Measure Help from another person eating meals?: None Help from another person taking care of personal grooming?: None Help from another person toileting, which includes using toliet, bedpan, or urinal?: None Help from another person bathing (including washing, rinsing, drying)?: A Little Help from another person to put on and taking off regular upper body clothing?: None Help from another person to put on and taking off regular lower body clothing?: None 6 Click Score: 23   End of Session Equipment Utilized During Treatment: Oxygen  Activity Tolerance: Patient tolerated treatment well Patient left: in chair;with call bell/phone within reach                   Time: 1131-1206 OT Time Calculation (min): 35 min Charges:  OT General Charges $OT Visit: 1 Visit OT Evaluation $OT Eval Low Complexity: 1 Low OT Treatments $Self Care/Home Management : 23-37 mins  Nino Glow, Markus Daft 06/06/2021, 1:07 PM

## 2021-06-06 NOTE — Evaluation (Signed)
Physical Therapy Evaluation Patient Details Name: Dylan Pittman MRN: [id number] DOB: September 20, 1950 Today's Date: 06/06/2021  History of Present Illness  Patient is a 70 year old male presenting to [facility] ED from home on 05/30/2021 with complaints of progressive hypoxia at rest/with exertion and dyspnea with exertion.  Of note patient was recently hospitalized with COVID-19 pneumonia from 05/09/2021 to 05/13/2021.  Clinical Impression  Patient is agreeable to PT evaluation and eager to mobilize. Patient reports he was at home with 2-4 L02 recently and is independent with limited activity tolerance at baseline. Today, patient does not require any physical assistance with mobility. He walked a lap in the hallway with slow but steady gait pattern with no rest breaks. Before and after the ambulation bout, Sp02 decreased from 90% down to mid 70's and patient required up to 5 minute seated rest break to recover. Also noted increased coughing when Sp02 decreases to the 70's. During the actual walking bout, Sp02 was 88%-90's on 10L02.  Patient educated on energy conservation techniques, breathing techniques, progressing activity slowly, and taking rest breaks as needed. Recommend PT follow up to maximize independence and facilitate return to prior level of function.      Recommendations for follow up therapy are one component of a multi-disciplinary discharge planning process, led by the attending physician.  Recommendations may be updated based on patient status, additional functional criteria and insurance authorization.  Follow Up Recommendations Home health PT    Equipment Recommendations  None recommended by PT    Recommendations for Other Services       Precautions / Restrictions Precautions Precautions: Fall Restrictions Weight Bearing Restrictions: No      Mobility  Bed Mobility Overal bed mobility: Modified Independent                  Transfers Overall transfer level: Modified  independent Equipment used: None                Ambulation/Gait Ambulation/Gait assistance: Supervision Gait Distance (Feet): 175 Feet Assistive device: None Gait Pattern/deviations: Step-through pattern;Decreased stride length Gait velocity: decreased   General Gait Details: patient walked a lap around nursing station with supervision, slow but steady gait with no loss of balance without assistive device. cues for energy conservation, taking rest breaks as needed, breathing techniques. Sp02 remained in the high 80's-90's during the walk on 10L02. after the walk, patient has increased coughing and does desaturate into the mid 70's and required about a 5 minute recovery priods to get back to 90%  Stairs            Wheelchair Mobility    Modified Rankin (Stroke Patients Only)       Balance Overall balance assessment: Needs assistance Sitting-balance support: Feet supported Sitting balance-Leahy Scale: Normal     Standing balance support: During functional activity;No upper extremity supported Standing balance-Leahy Scale: Good                               Pertinent Vitals/Pain Pain Assessment: No/denies pain    Home Living Family/patient expects to be discharged to:: Private residence Living Arrangements: Spouse/significant other;Children Available Help at Discharge: Family Type of Home: House Home Access:  (one step up)     Home Layout: One level Home Equipment: Environmental consultant - 2 wheels;Bedside commode      Prior Function Level of Independence: Independent  Hand Dominance        Extremity/Trunk Assessment   Upper Extremity Assessment Upper Extremity Assessment: Overall WFL for tasks assessed    Lower Extremity Assessment Lower Extremity Assessment: Overall WFL for tasks assessed       Communication   Communication: No difficulties  Cognition Arousal/Alertness: Awake/alert Behavior During Therapy: WFL for tasks  assessed/performed Overall Cognitive Status: Within Functional Limits for tasks assessed                                 General Comments: patient is alert, cooperative, and follows all commands without difficulty      General Comments General comments (skin integrity, edema, etc.): initially with sitting upright and mobilizing, Sp02 decreased from 88% to 80%. with 2-3 minute rest break and cues for breathing techniques, Sp02 sustaining in the high 80's- low 90's before walking.    Exercises     Assessment/Plan    PT Assessment Patient needs continued PT services  PT Problem List Decreased activity tolerance;Decreased mobility;Cardiopulmonary status limiting activity       PT Treatment Interventions DME instruction;Gait training;Stair training;Functional mobility training;Therapeutic activities;Therapeutic exercise;Balance training;Patient/family education    PT Goals (Current goals can be found in the Care Plan section)  Acute Rehab PT Goals Patient Stated Goal: to go home PT Goal Formulation: With patient Time For Goal Achievement: 06/20/21 Potential to Achieve Goals: Good    Frequency Min 2X/week   Barriers to discharge        Co-evaluation               AM-PAC PT "6 Clicks" Mobility  Outcome Measure Help needed turning from your back to your side while in a flat bed without using bedrails?: None Help needed moving from lying on your back to sitting on the side of a flat bed without using bedrails?: None Help needed moving to and from a bed to a chair (including a wheelchair)?: None Help needed standing up from a chair using your arms (e.g., wheelchair or bedside chair)?: None Help needed to walk in hospital room?: A Little Help needed climbing 3-5 steps with a railing? : A Little 6 Click Score: 22    End of Session Equipment Utilized During Treatment: Oxygen Activity Tolerance: Patient tolerated treatment well Patient left:  (on the bed side  commode per patient request with nurse and aide alerted. Sp02 in the 90's) Nurse Communication: Mobility status PT Visit Diagnosis: Difficulty in walking, not elsewhere classified (R26.2)    Time: 4709-6283 PT Time Calculation (min) (ACUTE ONLY): 47 min   Charges:   PT Evaluation $PT Eval Moderate Complexity: 1 Mod PT Treatments $Therapeutic Activity: 23-37 mins        Minna Merritts, PT, MPT  Percell Locus 06/06/2021, 10:17 AM

## 2021-06-07 DIAGNOSIS — J9621 Acute and chronic respiratory failure with hypoxia: Secondary | ICD-10-CM | POA: Diagnosis not present

## 2021-06-07 LAB — GLUCOSE, CAPILLARY
Glucose-Capillary: 116 mg/dL — ABNORMAL HIGH (ref 70–99)
Glucose-Capillary: 124 mg/dL — ABNORMAL HIGH (ref 70–99)
Glucose-Capillary: 372 mg/dL — ABNORMAL HIGH (ref 70–99)
Glucose-Capillary: 186 mg/dL — ABNORMAL HIGH (ref 70–99)

## 2021-06-07 MED ORDER — HYDROCORTISONE ACETATE 25 MG RE SUPP
25.0000 mg | Freq: Two times a day (BID) | RECTAL | Status: AC
  Administered 2021-06-07 – 2021-06-10 (×6): 25 mg via RECTAL
  Filled 2021-06-07 (×6): qty 1

## 2021-06-07 NOTE — Progress Notes (Signed)
NAME:  Dylan Pittman, MRN:  [id number], DOB:  Aug 13, 1951, LOS: 67 ADMISSION DATE:  05/30/2021, CONSULTATION DATE:  05/30/21 REFERRING MD:  Dr. Creig Hines, CHIEF COMPLAINT:   Shortness of breath  History of Present Illness:  70 year old male presenting to [facility] ED from home on 05/30/2021 with complaints of progressive hypoxia at rest/with exertion and dyspnea with exertion.  Of note patient was recently hospitalized with COVID-19 pneumonia from 05/09/2021 to 05/13/2021.  He describes that since discharge he now wears continuous oxygen via nasal cannula.  He started at 2 L at rest and 4 L with ambulation, but reports he has had to increase to 5 L nasal cannula at rest which maintains his SPO2 greater than 90%.  He reports with any exertion his SPO2 drops into the 50s to 60%.  Earlier today he took a shower with his wife's assistance his oxygen dropped to 58% and it took 45 minutes for him to recover above 90% with his nasal cannula.  He does admit to feeling dizzy today correlating with his hypoxia, but denies any recent falls or injuries. He does have a CPAP machine to wear at night for his OSA, but admits that since discharge there was equipment difficulties and he has not been able to use it consistently.  He complains of persistent coughing spells with mostly clear phlegm, however earlier today he did notice some red streaks in his sputum.  He denies chest pain, nausea/vomiting/diarrhea/abdominal pain, fever/chills, or sore throat.      Pertinent  Medical History  Pulmonary fibrosis Hypertension Hyperlipidemia Type 2 diabetes Obstructive sleep apnea COVID-19 (September 2022) Prostate cancer status post seed ablation and Lupron injections BPH GERD Bronchitis Significant Hospital Events: Including procedures, antibiotic start and stop dates in addition to other pertinent events   05/30/2021: Admit to ICU on heated high flow nasal cannula at 75%/40 L in the secondary to suspected persistent pneumonia  from recent COVID-19 infection in the setting of chronic interstitial lung disease 06/01/21- Patient  is improved, reviewed medical plan with patient and wife 06/02/21-patient weaned to 50%/45L/min continue steroids , repeat blood work.  06/03/21- patient improved, met with wife and reviewed care plan today.  Legionella + on ab test but Urine negative suggestive of serotype 2 will order DFA and legionella culture. Increased zithromax to 500 daily 06/04/21- continue current care plan , patient is improved and weaned to 10L/min [place] 06/05/21- patient improved on supplemental O2, met with wife and reviewed care plan. Wil transfer to med surg with tele and start PT/OT 06/06/21- Patient is slightly improved, he is still coughing a lot and states the codiene cough suryp helps but not lasting but two hours so I have increased frequncely.  He still has high amount of phlegm on expectoration but inspissated unable to expectorate.  We have initiated mucomyst therapy today. Continue PT/OT.  Reviewed care plan with RT Beverlee Nims.  Met with friend and pastor of patient by name of Edmon Crape.  06/07/21- patient improved, weaned to 9L/min [place].  Worked well with mucomyst states he was able to expectorate copious phlegm.   Objective   Blood pressure 127/68, pulse 99, temperature 98.3 F (36.8 C), resp. rate 20, height '5\' 8"'  (1.727 m), weight 101.1 kg, SpO2 95 %.        Intake/Output Summary (Last 24 hours) at 06/07/2021 1223 Last data filed at 06/07/2021 0735 Gross per 24 hour  Intake 354 ml  Output 1975 ml  Net -1621 ml    Danley Danker  Weights   06/05/21 0500 06/06/21 0258 06/07/21 0500  Weight: 98.6 kg 100.6 kg 101.1 kg    Examination: General: Adult male, acutely ill, lying in bed, NAD HEENT: MM pink/moist, anicteric, atraumatic, neck supple Neuro: A&O x 4, able to follow commands, PERRL +3, MAE CV: s1s2 RRR, sinus tachycardia on monitor, no r/m/g Pulm: Regular, mildly labored/dyspneic at rest on HHFNC55% breath  sounds coarse/diminished-BUL & diminished-BLL GI: soft, rounded, non tender, bs x 4 Skin: Limited exam-patient still in street clothes> no rashes/lesions noted Extremities: warm/dry, pulses + 2 R/P, trace edema noted BLE  Resolved Hospital Problem list     Assessment & Plan:  Acute Hypoxic Respiratory Failure       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          -Legionella Ab + , UG neg, DFA and legionella culture ordered - Continue HHFNC, wean FiO2 as tolerated > may need to transition to BIPAP overnight d/t OSA - Supplemental O2 to maintain SpO2 > 88% - Intermittent chest x-ray & ABG PRN - Ensure adequate pulmonary hygiene: IS Q 1 h x 10 while awake - F/u cultures, Respiratory 20 pathogen panel pending, f/u Strep pna antigen - trend PCT, daily CBC- monitor WBC/fever curve - PCT elevated at 1.15, will continue HCAP coverage: cefepime & Vancomycin - Breo-Ellipta inhaler BID, scheduled Duo-Nebs & bronchodilators PRN - Solu-medrol 40 mg BID>>>>once daily >>Prednisone 4m 10/9 - cxr in am reviewed with patient 06/06/21 - continue home Vitamin C & Zinc  Type 2 Diabetes Mellitus At Risk for Steroid Induced Hyperglycemia - Monitor CBG AC, HS - SSI moderate dosing, continue home long-acting coverage (solostar 40 units daily) >> Lantus 20 units BID - target range while in ICU: 140-180 - follow ICU hyper/hypo-glycemia protocol - Carb-modified diet in place  BPH - continue home flomax - strict I & O's, monitor for urinary retention  Hyperlipidemia  Hypertension - continue home rosuvastatin - due to SIRS response to suspected Pneumonia with marginal-normal BP, will hold outpatient anti-hypertensives. Consider restarting: Losartan as patient stabilizes - PRN hydralazine if SBP > 160  Best Practice (right click and "Reselect all SmartList Selections" daily)  Diet/type: Regular consistency (see orders) DVT prophylaxis: LMWH GI prophylaxis: PPI Lines:  N/A Foley:  N/A Code Status:  full code Last date of multidisciplinary goals of care discussion [05/30/2021]  IMAGING   Recent Labs  Lab 06/01/21 0435 06/02/21 0605 06/03/21 0512 06/04/21 0441 06/05/21 0612 06/06/21 0449  WBC 10.2 8.6 7.6 9.9 9.8 10.2  NEUTROABS 8.5* 6.7  --  5.9  --   --   HGB 11.9* 12.8* 12.3* 13.2 13.2 13.2  HCT 35.7* 37.9* 37.1* 39.1 39.5 40.8  MCV 92.2 93.8 90.5 92.4 93.4 93.2  PLT 342 349 329 358 319 328     Basic Metabolic Panel: Recent Labs  Lab 06/01/21 0435 06/02/21 0605 06/03/21 0512 06/04/21 0441 06/05/21 0612 06/06/21 0449  NA 138 137 137 138 139 136  K 4.2 4.1 4.2 3.8 3.9 4.1  CL 101 98 96* 95* 93* 93*  CO2 30 33* 34* 36* 37* 38*  GLUCOSE 250* 249* 259* 150* 125* 242*  BUN '20 19 16 17 18 18  ' CREATININE 0.72 0.65 0.52* 0.54* 0.45* 0.72  CALCIUM 8.4* 8.1* 8.5* 8.4* 8.3* 8.3*  MG 2.4 2.4  --  2.2 2.2 2.3  PHOS 3.2 3.2  --  4.2 4.6 4.6    GFR: Estimated Creatinine Clearance: 101.9 mL/min (by C-G formula  based on SCr of 0.72 mg/dL). Recent Labs  Lab 06/01/21 0435 06/02/21 0605 06/03/21 0512 06/04/21 0441 06/05/21 0612 06/06/21 0449  PROCALCITON 0.40  --   --   --   --   --   WBC 10.2   < > 7.6 9.9 9.8 10.2   < > = values in this interval not displayed.     Liver Function Tests: No results for input(s): AST, ALT, ALKPHOS, BILITOT, PROT, ALBUMIN in the last 168 hours.  No results for input(s): LIPASE, AMYLASE in the last 168 hours. No results for input(s): AMMONIA in the last 168 hours.  ABG No results found for: PHART, PCO2ART, PO2ART, HCO3, TCO2, ACIDBASEDEF, O2SAT   Coagulation Profile: No results for input(s): INR, PROTIME in the last 168 hours.  Cardiac Enzymes: No results for input(s): CKTOTAL, CKMB, CKMBINDEX, TROPONINI in the last 168 hours.  HbA1C: Hgb A1c MFr Bld  Date/Time Value Ref Range Status  05/09/2021 06:18 AM 8.6 (H) 4.8 - 5.6 % Final    Comment:    (NOTE) Pre diabetes:           5.7%-6.4%  Diabetes:              >6.4%  Glycemic control for   <7.0% adults with diabetes     CBG: Recent Labs  Lab 06/06/21 1229 06/06/21 1641 06/06/21 2112 06/07/21 0732 06/07/21 1215  GLUCAP 217* 430* 237* 116* 124*     Review of Systems: Positives in BOLD   Gen: Denies fever, chills, weight change, fatigue, night sweats HEENT: Denies blurred vision, double vision, hearing loss, tinnitus, sinus congestion, rhinorrhea, sore throat, neck stiffness, dysphagia PULM: Denies shortness of breath, cough, sputum production, hemoptysis, wheezing CV: Denies chest pain, edema, orthopnea, paroxysmal nocturnal dyspnea, palpitations GI: Denies abdominal pain, nausea, vomiting, diarrhea, hematochezia, melena, constipation, change in bowel habits GU: Denies dysuria, hematuria, polyuria, oliguria, urethral discharge Endocrine: Denies hot or cold intolerance, polyuria, polyphagia or appetite change Derm: Denies rash, dry skin, scaling or peeling skin change Heme: Denies easy bruising, bleeding, bleeding gums Neuro: Denies headache, numbness, weakness, slurred speech, loss of memory or consciousness  Past Medical History:  He,  has a past medical history of Chronic airway obstruction (HCC), DDD (degenerative disc disease), lumbar, Dupuytren's disease, Dyslipidemia (04/07/2014), Esophageal reflux (04/20/2014), Hypertension (04/20/2014), Microalbuminuria (04/07/2014), Microalbuminuria, Obesity, unspecified (04/07/2014), Sleep apnea (04/20/2014), and 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   Surgical History:   Past Surgical History:  Procedure Laterality Date   BACK SURGERY  1992, 2005   CLOSED REDUCTION FINGER WITH PERCUTANEOUS PINNING Left 02/12/2020   Procedure: Closed reduction and pinning of left first metacarpal fracture with possible open reduction;  Surgeon: Hessie Knows, MD;  Location: [facility] ORS;  Service: Orthopedics;  Laterality: Left;   COLONOSCOPY WITH  PROPOFOL N/A 12/28/2017   Procedure: COLONOSCOPY WITH PROPOFOL;  Surgeon: Manya Silvas, MD;  Location: [facility] ENDOSCOPY;  Service: Endoscopy;  Laterality: N/A;   NASAL SEPTUM SURGERY     RADIOACTIVE SEED IMPLANT N/A 04/12/2021   Procedure: RADIOACTIVE SEED IMPLANT/BRACHYTHERAPY IMPLANT;  Surgeon: Abbie Sons, MD;  Location: [facility] ORS;  Service: Urology;  Laterality: N/A;  73 seeds implanted   VASECTOMY  2005     Social History:   reports that he has quit smoking. He has never used smokeless tobacco. He reports that he does not currently use alcohol. He reports that he does not currently use drugs.   Family History:  His family history is negative  for Prostate cancer and Kidney cancer.   Allergies No Known Allergies   Home Medications  Prior to Admission medications   Medication Sig Start Date End Date Taking? Authorizing Provider  albuterol (VENTOLIN HFA) 108 (90 Base) MCG/ACT inhaler Inhale 2 puffs into the lungs every 6 (six) hours as needed for wheezing or shortness of breath. 01/20/20  Yes [provider]  ascorbic acid (VITAMIN C) 1000 MG tablet Take 0.5 tablets (500 mg total) by mouth daily. 05/14/21  Yes Wieting, Richard, MD  cholecalciferol (VITAMIN D) 25 MCG (1000 UNIT) tablet Take 1,000 Units by mouth in the morning.   Yes [provider]  fluticasone-salmeterol (ADVAIR HFA) 115-21 MCG/ACT inhaler Inhale 2 puffs into the lungs 2 (two) times daily.   Yes [provider]  furosemide (LASIX) 20 MG tablet Take 20 mg by mouth daily. 05/26/21  Yes [provider]  gabapentin (NEURONTIN) 300 MG capsule Take 300 mg by mouth 3 (three) times daily. 05/26/21  Yes [provider]  insulin aspart (NOVOLOG) 100 UNIT/ML FlexPen Inject 8 Units into the skin 3 (three) times daily with meals. Okay to substitute generic 05/13/21  Yes Wieting, Richard, MD  losartan (COZAAR) 50 MG tablet Take 50 mg by mouth in the morning. 10/07/17  Yes [provider]  OZEMPIC, 1 MG/DOSE, 4 MG/3ML SOPN Inject 1 mg into the skin every Sunday. 07/29/19  Yes [provider]  pantoprazole (PROTONIX) 40 MG tablet Take 40 mg by mouth 2 (two) times daily. 10/19/17  Yes [provider]  pioglitazone (ACTOS) 30 MG tablet Take 30 mg by mouth in the morning. 07/27/19  Yes [provider]  rosuvastatin (CRESTOR) 5 MG tablet Take 5 mg by mouth every Monday, Wednesday, and Friday. In the morning 04/19/18  Yes [provider]  tamsulosin (FLOMAX) 0.4 MG CAPS capsule Take 1 capsule (0.4 mg total) by mouth daily. 03/25/21  Yes Vaillancourt, Samantha, PA-C  TOUJEO MAX SOLOSTAR 300 UNIT/ML Solostar Pen Inject 50 Units into the skin in the morning. Patient taking differently: Inject 40 Units into the skin in the morning. 05/13/21  Yes Wieting, Richard, MD  XIGDUO XR 12-998 MG TB24 Take 2 tablets by mouth every morning. 01/31/21  Yes [provider]  zinc sulfate 220 (50 Zn) MG capsule Take 1 capsule (220 mg total) by mouth daily. 05/14/21  Yes Wieting, Richard, MD  aspirin EC 81 MG tablet Take 81 mg by mouth in the morning. Swallow whole. Patient not taking: Reported on 05/30/2021    [provider]  chlorpheniramine-HYDROcodone (TUSSIONEX) 10-8 MG/5ML SUER Take 5 mLs by mouth every 12 (twelve) hours as needed for cough. Patient not taking: No sig reported 05/13/21   Loletha Grayer, MD  feeding supplement, GLUCERNA SHAKE, (GLUCERNA SHAKE) LIQD Take 237 mLs by mouth 2 (two) times daily between meals. 05/13/21   Loletha Grayer, MD  fluticasone-salmeterol (ADVAIR HFA) 435-68 MCG/ACT inhaler Inhale 2 puffs into the lungs 2 (two) times daily. Rinse mouth out with water after use Patient not taking: No sig reported 05/13/21   Loletha Grayer, MD  Insulin Pen Needle 33G X 4 MM MISC 1 Dose by Does not apply route 3 (three) times daily before meals. 05/13/21   Loletha Grayer, MD  predniSONE (DELTASONE) 10 MG tablet Three tabs po daily for  five days Patient not taking: No sig reported 05/13/21   Loletha Grayer, MD       Ottie Glazier, M.D.  Pulmonary & Critical Care Medicine

## 2021-06-07 NOTE — Progress Notes (Signed)
Dylan Pittman  PYK:[id number] DOB: 11-18-1950 DOA: 05/30/2021 PCP: Sofie Hartigan, MD    Brief Narrative:  70 year old with a history of pulmonary fibrosis, HTN, HLD, DM2, obstructive sleep apnea, prostate cancer status post seed ablation and Lupron, BPH, and GERD who was admitted after presenting to the [facility] ED from home 10/3 with progressive hypoxia at rest and severe dyspnea on exertion.  The patient had been hospitalized with COVID pneumonia 9/12 >05/13/2021.  He stated that his dyspnea only worsened after being discharged home with increasing oxygen support requirements each day.  He noted his saturations dropping into the 50-60% range with exertion.  Significant Events:  10/3 admit via ED - required [place] 10/6 weaning of [place]  10/8 weaned to nasal cannula at 10 L [place]   Consultants:  PCCM  Code Status: FULL CODE  Antimicrobials:  Azithromycin 10/5 > 10/9 Cefepime 10/3 > 10/4 Vancomycin 10/3 > 10/5  DVT prophylaxis: Lovenox  Subjective: No new events reported overnight.  Patient continues to desaturate at times getting as low as 70 during a coughing fit yesterday despite 10 L via [place] high flow nasal cannula.  Continues to require 10 L [place] high flow nasal cannula at rest.  Vital signs otherwise stable.  Assessment & Plan:  Acute on chronic hypoxic respiratory failure - Post CoViD ILD / Pulm Fibrosis - ILD w/ acute exacerbation - possible Legionella  Care per Pulmonary -weaning oxygen as able -weaning steroids - still requiring high level O2 support - Legionella Ag negative but Ab+ therefore DFA and culture ordered per Pulm   DM2 CBG variable with 1 anomaly yesterday related to eating cookies -monitor today without change  Anal fissure v/s small hemorrhoid - scant BRBPR Keep lubricated w/ vaseline or neosporin - trial of anusol suppository - keep stools loose - monitor   BPH Continue usual home medical therapy  HLD Continue usual home medical  therapy  HTN Blood pressure currently well controlled  Family Communication: Spoke with wife at bedside at length Status is: Inpatient  Remains inpatient appropriate because:Inpatient level of care appropriate due to severity of illness  Dispo: The patient is from: Home              Anticipated d/c is to: Home              Patient currently is not medically stable to d/c.   Difficult to place patient No  Objective: Blood pressure 131/79, pulse 86, temperature 97.9 F (36.6 C), resp. rate 16, height 5\' 8"  (1.727 m), weight 101.1 kg, SpO2 98 %.  Intake/Output Summary (Last 24 hours) at 06/07/2021 1033 Last data filed at 06/07/2021 0735 Gross per 24 hour  Intake 354 ml  Output 2175 ml  Net -1821 ml    Filed Weights   06/05/21 0500 06/06/21 0258 06/07/21 0500  Weight: 98.6 kg 100.6 kg 101.1 kg    Examination: General: No acute respiratory distress but requiring high-level oxygen support Lungs: Poor air movement diffusely with very fine faint crackles appreciated throughout all fields with no wheezing Cardiovascular: Regular rate and rhythm without murmur  Abdomen: Nontender, nondistended, soft, bowel sounds positive, no rebound Extremities: Trace bilateral lower extremity edema  CBC: Recent Labs  Lab 06/01/21 0435 06/02/21 0605 06/03/21 0512 06/04/21 0441 06/05/21 0612 06/06/21 0449  WBC 10.2 8.6   < > 9.9 9.8 10.2  NEUTROABS 8.5* 6.7  --  5.9  --   --   HGB 11.9* 12.8*   < > 13.2  13.2 13.2  HCT 35.7* 37.9*   < > 39.1 39.5 40.8  MCV 92.2 93.8   < > 92.4 93.4 93.2  PLT 342 349   < > 358 319 328   < > = values in this interval not displayed.    Basic Metabolic Panel: Recent Labs  Lab 06/04/21 0441 06/05/21 0612 06/06/21 0449  NA 138 139 136  K 3.8 3.9 4.1  CL 95* 93* 93*  CO2 36* 37* 38*  GLUCOSE 150* 125* 242*  BUN 17 18 18   CREATININE 0.54* 0.45* 0.72  CALCIUM 8.4* 8.3* 8.3*  MG 2.2 2.2 2.3  PHOS 4.2 4.6 4.6    GFR: Estimated Creatinine  Clearance: 101.9 mL/min (by C-G formula based on SCr of 0.72 mg/dL).  Liver Function Tests: No results for input(s): AST, ALT, ALKPHOS, BILITOT, PROT, ALBUMIN in the last 168 hours.   HbA1C: Hgb A1c MFr Bld  Date/Time Value Ref Range Status  05/09/2021 06:18 AM 8.6 (H) 4.8 - 5.6 % Final    Comment:    (NOTE) Pre diabetes:          5.7%-6.4%  Diabetes:              >6.4%  Glycemic control for   <7.0% adults with diabetes     CBG: Recent Labs  Lab 06/06/21 0754 06/06/21 1229 06/06/21 1641 06/06/21 2112 06/07/21 0732  GLUCAP 132* 217* 430* 237* 116*     Recent Results (from the past 240 hour(s))  Blood culture (single)     Status: None   Collection Time: 05/30/21  2:52 PM   Specimen: BLOOD  Result Value Ref Range Status   Specimen Description BLOOD LEFT ANTECUBITAL  Final   Special Requests   Final    BOTTLES DRAWN AEROBIC AND ANAEROBIC Blood Culture adequate volume   Culture   Final    NO GROWTH 5 DAYS Performed at [facility], [address] [place], [place] [address]    Report Status 06/04/2021 FINAL  Final  Respiratory (~20 pathogens) panel by PCR     Status: None   Collection Time: 05/30/21  5:32 PM   Specimen: Nasopharyngeal Swab; Respiratory  Result Value Ref Range Status   Adenovirus NOT DETECTED NOT DETECTED Final   Coronavirus 229E NOT DETECTED NOT DETECTED Final    Comment: (NOTE) The Coronavirus on the Respiratory Panel, DOES NOT test for the novel  Coronavirus (2019 nCoV)    Coronavirus HKU1 NOT DETECTED NOT DETECTED Final   Coronavirus NL63 NOT DETECTED NOT DETECTED Final   Coronavirus OC43 NOT DETECTED NOT DETECTED Final   Metapneumovirus NOT DETECTED NOT DETECTED Final   Rhinovirus / Enterovirus NOT DETECTED NOT DETECTED Final   Influenza A NOT DETECTED NOT DETECTED Final   Influenza B NOT DETECTED NOT DETECTED Final   Parainfluenza Virus 1 NOT DETECTED NOT DETECTED Final   Parainfluenza Virus 2 NOT DETECTED NOT DETECTED Final    Parainfluenza Virus 3 NOT DETECTED NOT DETECTED Final   Parainfluenza Virus 4 NOT DETECTED NOT DETECTED Final   Respiratory Syncytial Virus NOT DETECTED NOT DETECTED Final   Bordetella pertussis NOT DETECTED NOT DETECTED Final   Bordetella Parapertussis NOT DETECTED NOT DETECTED Final   Chlamydophila pneumoniae NOT DETECTED NOT DETECTED Final   Mycoplasma pneumoniae NOT DETECTED NOT DETECTED Final    Comment: Performed at [facility], [address] [place], [place] [address]  MRSA Next Gen by PCR, Nasal     Status: None  Collection Time: 05/31/21  2:36 PM   Specimen: Nasal Mucosa; Nasal Swab  Result Value Ref Range Status   MRSA by PCR Next Gen NOT DETECTED NOT DETECTED Final    Comment: (NOTE) The GeneXpert MRSA Assay (FDA approved for NASAL specimens only), is one component of a comprehensive MRSA colonization surveillance program. It is not intended to diagnose MRSA infection nor to guide or monitor treatment for MRSA infections. Test performance is not FDA approved in patients less than 58 years old. Performed at [facility], [address] [place], [place] [address]       Scheduled Meds:  acetylcysteine  4 mL Nebulization BID   ascorbic acid  500 mg Oral Daily   azithromycin  500 mg Oral Daily   Chlorhexidine Gluconate Cloth  6 each Topical Q0600   chlorpheniramine-HYDROcodone  5 mL Oral Q4H   enoxaparin (LOVENOX) injection  0.5 mg/kg Subcutaneous Q24H   fluticasone furoate-vilanterol  1 puff Inhalation Daily   furosemide  20 mg Oral Daily   gabapentin  300 mg Oral TID   insulin aspart  0-20 Units Subcutaneous TID AC & HS   insulin aspart  17 Units Subcutaneous TID WC   insulin glargine-yfgn  30 Units Subcutaneous BID   ipratropium-albuterol  3 mL Nebulization Q6H   neomycin-bacitracin-polymyxin   Topical BID   pantoprazole  40 mg Oral BID   predniSONE  50 mg Oral Q breakfast   rosuvastatin  5 mg Oral Q M,W,F   tamsulosin  0.4 mg Oral Daily    zinc sulfate  220 mg Oral Daily      LOS: 8 days   Cherene Altes, MD [facility] Office  [phone] Pager - Text Page per Shea Evans  If 7PM-7AM, please contact night-coverage per Amion 06/07/2021, 10:33 AM

## 2021-06-07 NOTE — TOC Progression Note (Signed)
Transition of Care [facility]) - Progression Note    Patient Details  Name: NICHOALS HEYDE MRN: [id number] Date of Birth: 10/30/1950  Transition of Care [facility]) CM/SW [place], RN Phone Number: 06/07/2021, 9:27 AM  Clinical Narrative:   Home Health PT recommended.  Alex from [facility] confirms that they can take patient for PT services.     Expected Discharge Plan: [place] Barriers to Discharge: Continued Medical Work up  Expected Discharge Plan and Services Expected Discharge Plan: [place]   Discharge Planning Services: CM Consult Post Acute Care Choice: [place] arrangements for the past 2 months: Single Family Home                 DME Arranged:  (No DME recommended, has home o2 with adapt, no issues)                     Social Determinants of Health (SDOH) Interventions    Readmission Risk Interventions No flowsheet data found.

## 2021-06-08 DIAGNOSIS — J9621 Acute and chronic respiratory failure with hypoxia: Secondary | ICD-10-CM

## 2021-06-08 LAB — GLUCOSE, CAPILLARY
Glucose-Capillary: 157 mg/dL — ABNORMAL HIGH (ref 70–99)
Glucose-Capillary: 140 mg/dL — ABNORMAL HIGH (ref 70–99)
Glucose-Capillary: 217 mg/dL — ABNORMAL HIGH (ref 70–99)
Glucose-Capillary: 222 mg/dL — ABNORMAL HIGH (ref 70–99)

## 2021-06-08 MED ORDER — PREDNISONE 20 MG PO TABS: 30.0000 mg | ORAL_TABLET | Freq: Every day | ORAL | Status: DC

## 2021-06-08 MED ORDER — PREDNISONE 10 MG PO TABS: 10.0000 mg | ORAL_TABLET | Freq: Every day | ORAL | Status: DC

## 2021-06-08 MED ORDER — PREDNISONE 20 MG PO TABS: 20.0000 mg | ORAL_TABLET | Freq: Every day | ORAL | Status: DC

## 2021-06-08 MED ORDER — PREDNISONE 20 MG PO TABS: 40.0000 mg | ORAL_TABLET | Freq: Every day | ORAL | Status: DC

## 2021-06-08 NOTE — Progress Notes (Signed)
Dylan Pittman  DEY:[id number] DOB: December 13, 1950 DOA: 05/30/2021 PCP: Sofie Hartigan, MD    Brief Narrative:  70 year old with a history of pulmonary fibrosis, HTN, HLD, DM2, obstructive sleep apnea, prostate cancer status post seed ablation and Lupron, BPH, and GERD who was admitted after presenting to the [facility] ED from home 10/3 with progressive hypoxia at rest and severe dyspnea on exertion.  The patient had been hospitalized with COVID pneumonia 9/12 >05/13/2021.  He stated that his dyspnea only worsened after being discharged home with increasing oxygen support requirements each day.  He noted his saturations dropping into the 50-60% range with exertion.  Significant Events:  10/3 admit via ED - required [place] 10/6 weaning of [place]  10/8 weaned to nasal cannula at 10 L [place]  10/12 weaned to 7L, symptomatically improving  Consultants:  PCCM  Code Status: FULL CODE  Antimicrobials:  Azithromycin 10/5 >  Cefepime 10/3 > 10/4 Vancomycin 10/3 > 10/5  DVT prophylaxis: Lovenox  Subjective: No new events reported overnight.  Remains dyspneic with exertion, this is improving. Otherwise feels well  Assessment & Plan:  Acute on chronic hypoxic respiratory failure - Post CoViD ILD / Pulm Fibrosis - ILD w/ acute exacerbation - possible Legionella  Care per Pulmonary -weaning oxygen as able - pulm will start weaning steroids tomorrow if continues to improve - still requiring high level O2 support but decreasing - Legionella Ag negative but Ab+ therefore DFA and culture ordered per Pulm, continuing azithromcyin for now. Continuing mucomyst, breo  DM2 CBG mild elevations. Continuing semglee 30 bid and novolog 17 units w/ meals, with ssi  Anal fissure v/s small hemorrhoid - scant BRBPR resolved Keep lubricated w/ vaseline or neosporin - trial of anusol suppository - keep stools loose - monitor   BPH Continue usual home medical therapy  HLD Continue usual home medical  therapy  HTN Blood pressure currently well controlled  Family Communication: Spoke with wife at bedside at length Status is: Inpatient  Remains inpatient appropriate because:Inpatient level of care appropriate due to severity of illness  Dispo: The patient is from: Home              Anticipated d/c is to: Home w/ home health PT              Patient currently is not medically stable to d/c.   Difficult to place patient No  Objective: Blood pressure 136/72, pulse 94, temperature 97.8 F (36.6 C), temperature source Oral, resp. rate 17, height 5\' 8"  (1.727 m), weight 101.1 kg, SpO2 95 %.  Intake/Output Summary (Last 24 hours) at 06/08/2021 1406 Last data filed at 06/08/2021 1014 Gross per 24 hour  Intake 240 ml  Output 1320 ml  Net -1080 ml   Filed Weights   06/05/21 0500 06/06/21 0258 06/07/21 0500  Weight: 98.6 kg 100.6 kg 101.1 kg    Examination: General: No acute respiratory distress but requiring high-level oxygen support Lungs: Poor air movement diffusely with very fine faint crackles appreciated throughout all fields with no wheezing Cardiovascular: Regular rate and rhythm without murmur  Abdomen: Nontender, nondistended, soft, bowel sounds positive, no rebound Extremities: Trace bilateral lower extremity edema  CBC: Recent Labs  Lab 06/02/21 0605 06/03/21 0512 06/04/21 0441 06/05/21 0612 06/06/21 0449  WBC 8.6   < > 9.9 9.8 10.2  NEUTROABS 6.7  --  5.9  --   --   HGB 12.8*   < > 13.2 13.2 13.2  HCT 37.9*   < >  39.1 39.5 40.8  MCV 93.8   < > 92.4 93.4 93.2  PLT 349   < > 358 319 328   < > = values in this interval not displayed.   Basic Metabolic Panel: Recent Labs  Lab 06/04/21 0441 06/05/21 0612 06/06/21 0449  NA 138 139 136  K 3.8 3.9 4.1  CL 95* 93* 93*  CO2 36* 37* 38*  GLUCOSE 150* 125* 242*  BUN 17 18 18   CREATININE 0.54* 0.45* 0.72  CALCIUM 8.4* 8.3* 8.3*  MG 2.2 2.2 2.3  PHOS 4.2 4.6 4.6   GFR: Estimated Creatinine Clearance: 101.9  mL/min (by C-G formula based on SCr of 0.72 mg/dL).  Liver Function Tests: No results for input(s): AST, ALT, ALKPHOS, BILITOT, PROT, ALBUMIN in the last 168 hours.   HbA1C: Hgb A1c MFr Bld  Date/Time Value Ref Range Status  05/09/2021 06:18 AM 8.6 (H) 4.8 - 5.6 % Final    Comment:    (NOTE) Pre diabetes:          5.7%-6.4%  Diabetes:              >6.4%  Glycemic control for   <7.0% adults with diabetes     CBG: Recent Labs  Lab 06/07/21 1215 06/07/21 1619 06/07/21 2118 06/08/21 0806 06/08/21 1247  GLUCAP 124* 372* 186* 157* 140*    Recent Results (from the past 240 hour(s))  Blood culture (single)     Status: None   Collection Time: 05/30/21  2:52 PM   Specimen: BLOOD  Result Value Ref Range Status   Specimen Description BLOOD LEFT ANTECUBITAL  Final   Special Requests   Final    BOTTLES DRAWN AEROBIC AND ANAEROBIC Blood Culture adequate volume   Culture   Final    NO GROWTH 5 DAYS Performed at [facility], [address] [place], [place] [address]    Report Status 06/04/2021 FINAL  Final  Respiratory (~20 pathogens) panel by PCR     Status: None   Collection Time: 05/30/21  5:32 PM   Specimen: Nasopharyngeal Swab; Respiratory  Result Value Ref Range Status   Adenovirus NOT DETECTED NOT DETECTED Final   Coronavirus 229E NOT DETECTED NOT DETECTED Final    Comment: (NOTE) The Coronavirus on the Respiratory Panel, DOES NOT test for the novel  Coronavirus (2019 nCoV)    Coronavirus HKU1 NOT DETECTED NOT DETECTED Final   Coronavirus NL63 NOT DETECTED NOT DETECTED Final   Coronavirus OC43 NOT DETECTED NOT DETECTED Final   Metapneumovirus NOT DETECTED NOT DETECTED Final   Rhinovirus / Enterovirus NOT DETECTED NOT DETECTED Final   Influenza A NOT DETECTED NOT DETECTED Final   Influenza B NOT DETECTED NOT DETECTED Final   Parainfluenza Virus 1 NOT DETECTED NOT DETECTED Final   Parainfluenza Virus 2 NOT DETECTED NOT DETECTED Final   Parainfluenza  Virus 3 NOT DETECTED NOT DETECTED Final   Parainfluenza Virus 4 NOT DETECTED NOT DETECTED Final   Respiratory Syncytial Virus NOT DETECTED NOT DETECTED Final   Bordetella pertussis NOT DETECTED NOT DETECTED Final   Bordetella Parapertussis NOT DETECTED NOT DETECTED Final   Chlamydophila pneumoniae NOT DETECTED NOT DETECTED Final   Mycoplasma pneumoniae NOT DETECTED NOT DETECTED Final    Comment: Performed at [facility], [address] [place], [place] [address]  MRSA Next Gen by PCR, Nasal     Status: None   Collection Time: 05/31/21  2:36 PM   Specimen: Nasal Mucosa; Nasal Swab  Result Value Ref Range Status   MRSA by PCR Next Gen NOT DETECTED NOT DETECTED Final    Comment: (NOTE) The GeneXpert MRSA Assay (FDA approved for NASAL specimens only), is one component of a comprehensive MRSA colonization surveillance program. It is not intended to diagnose MRSA infection nor to guide or monitor treatment for MRSA infections. Test performance is not FDA approved in patients less than 93 years old. Performed at [facility], [address] [place], [place] [address]      Scheduled Meds:  acetylcysteine  4 mL Nebulization BID   ascorbic acid  500 mg Oral Daily   azithromycin  500 mg Oral Daily   Chlorhexidine Gluconate Cloth  6 each Topical Q0600   chlorpheniramine-HYDROcodone  5 mL Oral Q4H   enoxaparin (LOVENOX) injection  0.5 mg/kg Subcutaneous Q24H   fluticasone furoate-vilanterol  1 puff Inhalation Daily   furosemide  20 mg Oral Daily   gabapentin  300 mg Oral TID   hydrocortisone  25 mg Rectal BID   insulin aspart  0-20 Units Subcutaneous TID AC & HS   insulin aspart  17 Units Subcutaneous TID WC   insulin glargine-yfgn  30 Units Subcutaneous BID   ipratropium-albuterol  3 mL Nebulization Q6H   neomycin-bacitracin-polymyxin   Topical BID   pantoprazole  40 mg Oral BID   predniSONE  50 mg Oral Q breakfast   rosuvastatin  5 mg Oral Q M,W,F   tamsulosin  0.4  mg Oral Daily   zinc sulfate  220 mg Oral Daily      LOS: 9 days   Laurey Arrow, MD   If 7PM-7AM, please contact night-coverage per Amion 06/08/2021, 2:06 PM

## 2021-06-08 NOTE — TOC Progression Note (Signed)
Transition of Care [facility]) - Progression Note    Patient Details  Name: KENDALE REMBOLD MRN: [id number] Date of Birth: 1951-04-24  Transition of Care [facility]) CM/SW [place], RN Phone Number: 06/08/2021, 4:31 PM  Clinical Narrative:  Checked in with patient, no changes.  Home Health with [facility].     Expected Discharge Plan: [place] Barriers to Discharge: Continued Medical Work up  Expected Discharge Plan and Services Expected Discharge Plan: [place]   Discharge Planning Services: CM Consult Post Acute Care Choice: [place] arrangements for the past 2 months: Single Family Home                 DME Arranged:  (No DME recommended, has home o2 with adapt, no issues)                     Social Determinants of Health (SDOH) Interventions    Readmission Risk Interventions No flowsheet data found.

## 2021-06-08 NOTE — Progress Notes (Signed)
NAME:  Dylan Pittman, MRN:  [id number], DOB:  1951-01-12, LOS: 85 ADMISSION DATE:  05/30/2021, CONSULTATION DATE:  05/30/21 REFERRING MD:  Dr. Creig Hines, CHIEF COMPLAINT:   Shortness of breath  History of Present Illness:  70 year old male presenting to [facility] ED from home on 05/30/2021 with complaints of progressive hypoxia at rest/with exertion and dyspnea with exertion.  Of note patient was recently hospitalized with COVID-19 pneumonia from 05/09/2021 to 05/13/2021.  He describes that since discharge he now wears continuous oxygen via nasal cannula.  He started at 2 L at rest and 4 L with ambulation, but reports he has had to increase to 5 L nasal cannula at rest which maintains his SPO2 greater than 90%.  He reports with any exertion his SPO2 drops into the 50s to 60%.  Earlier today he took a shower with his wife's assistance his oxygen dropped to 58% and it took 45 minutes for him to recover above 90% with his nasal cannula.  He does admit to feeling dizzy today correlating with his hypoxia, but denies any recent falls or injuries. He does have a CPAP machine to wear at night for his OSA, but admits that since discharge there was equipment difficulties and he has not been able to use it consistently.  He complains of persistent coughing spells with mostly clear phlegm, however earlier today he did notice some red streaks in his sputum.  He denies chest pain, nausea/vomiting/diarrhea/abdominal pain, fever/chills, or sore throat.      Pertinent  Medical History  Pulmonary fibrosis Hypertension Hyperlipidemia Type 2 diabetes Obstructive sleep apnea COVID-19 (September 2022) Prostate cancer status post seed ablation and Lupron injections BPH GERD Bronchitis Significant Hospital Events: Including procedures, antibiotic start and stop dates in addition to other pertinent events   05/30/2021: Admit to ICU on heated high flow nasal cannula at 75%/40 L in the secondary to suspected persistent pneumonia  from recent COVID-19 infection in the setting of chronic interstitial lung disease 06/01/21- Patient  is improved, reviewed medical plan with patient and wife 06/02/21-patient weaned to 50%/45L/min continue steroids , repeat blood work.  06/03/21- patient improved, met with wife and reviewed care plan today.  Legionella + on ab test but Urine negative suggestive of serotype 2 will order DFA and legionella culture. Increased zithromax to 500 daily 06/04/21- continue current care plan , patient is improved and weaned to 10L/min [place] 06/05/21- patient improved on supplemental O2, met with wife and reviewed care plan. Wil transfer to med surg with tele and start PT/OT 06/06/21- Patient is slightly improved, he is still coughing a lot and states the codiene cough suryp helps but not lasting but two hours so I have increased frequncely.  He still has high amount of phlegm on expectoration but inspissated unable to expectorate.  We have initiated mucomyst therapy today. Continue PT/OT.  Reviewed care plan with RT Beverlee Nims.  Met with friend and pastor of patient by name of Edmon Crape.  06/07/21- patient improved, weaned to 9L/min [place].  Worked well with mucomyst states he was able to expectorate copious phlegm.   06/08/21- cough is 80% less, patient ambulating 4 laps around hallways, FiO2 reduced to 7L/min.  Reduced rhonchi bilaterally on auscultation.  Will continue mucmoyst one more day and keep prednisone at 26m for one more day before weaning  Objective   Blood pressure 116/67, pulse 85, temperature 98.3 F (36.8 C), resp. rate 19, height '5\' 8"'  (1.727 m), weight 101.1 kg, SpO2 98 %.  Intake/Output Summary (Last 24 hours) at 06/08/2021 1153 Last data filed at 06/08/2021 1014 Gross per 24 hour  Intake 240 ml  Output 1320 ml  Net -1080 ml    Filed Weights   06/05/21 0500 06/06/21 0258 06/07/21 0500  Weight: 98.6 kg 100.6 kg 101.1 kg    Examination: General: Adult male, acutely ill, lying in bed,  NAD HEENT: MM pink/moist, anicteric, atraumatic, neck supple Neuro: A&O x 4, able to follow commands, PERRL +3, MAE CV: s1s2 RRR, sinus tachycardia on monitor, no r/m/g Pulm: Regular, mildly labored/dyspneic at rest on HHFNC55% breath sounds coarse/diminished-BUL & diminished-BLL GI: soft, rounded, non tender, bs x 4 Skin: Limited exam-patient still in street clothes> no rashes/lesions noted Extremities: warm/dry, pulses + 2 R/P, trace edema noted BLE  Resolved Hospital Problem list     Assessment & Plan:  Acute Hypoxic Respiratory Failure       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          -Legionella Ab + , UG neg, DFA and legionella culture ordered - Continue HHFNC, wean FiO2 as tolerated > may need to transition to BIPAP overnight d/t OSA - Supplemental O2 to maintain SpO2 > 88% - Intermittent chest x-ray & ABG PRN - Ensure adequate pulmonary hygiene: IS Q 1 h x 10 while awake - F/u cultures, Respiratory 20 pathogen panel pending, f/u Strep pna antigen - trend PCT, daily CBC- monitor WBC/fever curve - PCT elevated at 1.15, will continue HCAP coverage: cefepime & Vancomycin - Breo-Ellipta inhaler BID, scheduled Duo-Nebs & bronchodilators PRN - Solu-medrol 40 mg BID>>>>once daily >>Prednisone 64m 10/9 - cxr in am reviewed with patient 06/06/21 - continue home Vitamin C & Zinc  Type 2 Diabetes Mellitus At Risk for Steroid Induced Hyperglycemia - Monitor CBG AC, HS - SSI moderate dosing, continue home long-acting coverage (solostar 40 units daily) >> Lantus 20 units BID - target range while in ICU: 140-180 - follow ICU hyper/hypo-glycemia protocol - Carb-modified diet in place  BPH - continue home flomax - strict I & O's, monitor for urinary retention  Hyperlipidemia  Hypertension - continue home rosuvastatin - due to SIRS response to suspected Pneumonia with marginal-normal BP, will hold outpatient anti-hypertensives. Consider restarting:  Losartan as patient stabilizes - PRN hydralazine if SBP > 160  Best Practice (right click and "Reselect all SmartList Selections" daily)  Diet/type: Regular consistency (see orders) DVT prophylaxis: LMWH GI prophylaxis: PPI Lines: N/A Foley:  N/A Code Status:  full code Last date of multidisciplinary goals of care discussion [05/30/2021]  IMAGING   Recent Labs  Lab 06/02/21 0605 06/03/21 0512 06/04/21 0441 06/05/21 0612 06/06/21 0449  WBC 8.6 7.6 9.9 9.8 10.2  NEUTROABS 6.7  --  5.9  --   --   HGB 12.8* 12.3* 13.2 13.2 13.2  HCT 37.9* 37.1* 39.1 39.5 40.8  MCV 93.8 90.5 92.4 93.4 93.2  PLT 349 329 358 319 328     Basic Metabolic Panel: Recent Labs  Lab 06/02/21 0605 06/03/21 0512 06/04/21 0441 06/05/21 0612 06/06/21 0449  NA 137 137 138 139 136  K 4.1 4.2 3.8 3.9 4.1  CL 98 96* 95* 93* 93*  CO2 33* 34* 36* 37* 38*  GLUCOSE 249* 259* 150* 125* 242*  BUN '19 16 17 18 18  ' CREATININE 0.65 0.52* 0.54* 0.45* 0.72  CALCIUM 8.1* 8.5* 8.4* 8.3* 8.3*  MG 2.4  --  2.2 2.2 2.3  PHOS 3.2  --  4.2  4.6 4.6    GFR: Estimated Creatinine Clearance: 101.9 mL/min (by C-G formula based on SCr of 0.72 mg/dL). Recent Labs  Lab 06/03/21 0512 06/04/21 0441 06/05/21 0612 06/06/21 0449  WBC 7.6 9.9 9.8 10.2     Liver Function Tests: No results for input(s): AST, ALT, ALKPHOS, BILITOT, PROT, ALBUMIN in the last 168 hours.  No results for input(s): LIPASE, AMYLASE in the last 168 hours. No results for input(s): AMMONIA in the last 168 hours.  ABG No results found for: PHART, PCO2ART, PO2ART, HCO3, TCO2, ACIDBASEDEF, O2SAT   Coagulation Profile: No results for input(s): INR, PROTIME in the last 168 hours.  Cardiac Enzymes: No results for input(s): CKTOTAL, CKMB, CKMBINDEX, TROPONINI in the last 168 hours.  HbA1C: Hgb A1c MFr Bld  Date/Time Value Ref Range Status  05/09/2021 06:18 AM 8.6 (H) 4.8 - 5.6 % Final    Comment:    (NOTE) Pre diabetes:           5.7%-6.4%  Diabetes:              >6.4%  Glycemic control for   <7.0% adults with diabetes     CBG: Recent Labs  Lab 06/07/21 0732 06/07/21 1215 06/07/21 1619 06/07/21 2118 06/08/21 0806  GLUCAP 116* 124* 372* 186* 157*     Review of Systems: Positives in BOLD   Gen: Denies fever, chills, weight change, fatigue, night sweats HEENT: Denies blurred vision, double vision, hearing loss, tinnitus, sinus congestion, rhinorrhea, sore throat, neck stiffness, dysphagia PULM: Denies shortness of breath, cough, sputum production, hemoptysis, wheezing CV: Denies chest pain, edema, orthopnea, paroxysmal nocturnal dyspnea, palpitations GI: Denies abdominal pain, nausea, vomiting, diarrhea, hematochezia, melena, constipation, change in bowel habits GU: Denies dysuria, hematuria, polyuria, oliguria, urethral discharge Endocrine: Denies hot or cold intolerance, polyuria, polyphagia or appetite change Derm: Denies rash, dry skin, scaling or peeling skin change Heme: Denies easy bruising, bleeding, bleeding gums Neuro: Denies headache, numbness, weakness, slurred speech, loss of memory or consciousness  Past Medical History:  He,  has a past medical history of Chronic airway obstruction (HCC), DDD (degenerative disc disease), lumbar, Dupuytren's disease, Dyslipidemia (04/07/2014), Esophageal reflux (04/20/2014), Hypertension (04/20/2014), Microalbuminuria (04/07/2014), Microalbuminuria, Obesity, unspecified (04/07/2014), Sleep apnea (04/20/2014), and 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   Surgical History:   Past Surgical History:  Procedure Laterality Date   BACK SURGERY  1992, 2005   CLOSED REDUCTION FINGER WITH PERCUTANEOUS PINNING Left 02/12/2020   Procedure: Closed reduction and pinning of left first metacarpal fracture with possible open reduction;  Surgeon: Hessie Knows, MD;  Location: [facility] ORS;  Service: Orthopedics;  Laterality: Left;   COLONOSCOPY WITH  PROPOFOL N/A 12/28/2017   Procedure: COLONOSCOPY WITH PROPOFOL;  Surgeon: Manya Silvas, MD;  Location: [facility] ENDOSCOPY;  Service: Endoscopy;  Laterality: N/A;   NASAL SEPTUM SURGERY     RADIOACTIVE SEED IMPLANT N/A 04/12/2021   Procedure: RADIOACTIVE SEED IMPLANT/BRACHYTHERAPY IMPLANT;  Surgeon: Abbie Sons, MD;  Location: [facility] ORS;  Service: Urology;  Laterality: N/A;  73 seeds implanted   VASECTOMY  2005     Social History:   reports that he has quit smoking. He has never used smokeless tobacco. He reports that he does not currently use alcohol. He reports that he does not currently use drugs.   Family History:  His family history is negative for Prostate cancer and Kidney cancer.   Allergies No Known Allergies   Home Medications  Prior to Admission medications   Medication  Sig Start Date End Date Taking? Authorizing Provider  albuterol (VENTOLIN HFA) 108 (90 Base) MCG/ACT inhaler Inhale 2 puffs into the lungs every 6 (six) hours as needed for wheezing or shortness of breath. 01/20/20  Yes [provider]  ascorbic acid (VITAMIN C) 1000 MG tablet Take 0.5 tablets (500 mg total) by mouth daily. 05/14/21  Yes Wieting, Richard, MD  cholecalciferol (VITAMIN D) 25 MCG (1000 UNIT) tablet Take 1,000 Units by mouth in the morning.   Yes [provider]  fluticasone-salmeterol (ADVAIR HFA) 115-21 MCG/ACT inhaler Inhale 2 puffs into the lungs 2 (two) times daily.   Yes [provider]  furosemide (LASIX) 20 MG tablet Take 20 mg by mouth daily. 05/26/21  Yes [provider]  gabapentin (NEURONTIN) 300 MG capsule Take 300 mg by mouth 3 (three) times daily. 05/26/21  Yes [provider]  insulin aspart (NOVOLOG) 100 UNIT/ML FlexPen Inject 8 Units into the skin 3 (three) times daily with meals. Okay to substitute generic 05/13/21  Yes Wieting, Richard, MD  losartan (COZAAR) 50 MG tablet Take 50 mg by mouth in the morning. 10/07/17  Yes [provider]  OZEMPIC, 1 MG/DOSE, 4 MG/3ML SOPN Inject 1 mg into the skin every Sunday. 07/29/19  Yes [provider]  pantoprazole (PROTONIX) 40 MG tablet Take 40 mg by mouth 2 (two) times daily. 10/19/17  Yes [provider]  pioglitazone (ACTOS) 30 MG tablet Take 30 mg by mouth in the morning. 07/27/19  Yes [provider]  rosuvastatin (CRESTOR) 5 MG tablet Take 5 mg by mouth every Monday, Wednesday, and Friday. In the morning 04/19/18  Yes [provider]  tamsulosin (FLOMAX) 0.4 MG CAPS capsule Take 1 capsule (0.4 mg total) by mouth daily. 03/25/21  Yes Vaillancourt, Samantha, PA-C  TOUJEO MAX SOLOSTAR 300 UNIT/ML Solostar Pen Inject 50 Units into the skin in the morning. Patient taking differently: Inject 40 Units into the skin in the morning. 05/13/21  Yes Wieting, Richard, MD  XIGDUO XR 12-998 MG TB24 Take 2 tablets by mouth every morning. 01/31/21  Yes [provider]  zinc sulfate 220 (50 Zn) MG capsule Take 1 capsule (220 mg total) by mouth daily. 05/14/21  Yes Wieting, Richard, MD  aspirin EC 81 MG tablet Take 81 mg by mouth in the morning. Swallow whole. Patient not taking: Reported on 05/30/2021    [provider]  chlorpheniramine-HYDROcodone (TUSSIONEX) 10-8 MG/5ML SUER Take 5 mLs by mouth every 12 (twelve) hours as needed for cough. Patient not taking: No sig reported 05/13/21   Loletha Grayer, MD  feeding supplement, GLUCERNA SHAKE, (GLUCERNA SHAKE) LIQD Take 237 mLs by mouth 2 (two) times daily between meals. 05/13/21   Loletha Grayer, MD  fluticasone-salmeterol (ADVAIR HFA) 629-47 MCG/ACT inhaler Inhale 2 puffs into the lungs 2 (two) times daily. Rinse mouth out with water after use Patient not taking: No sig reported 05/13/21   Loletha Grayer, MD  Insulin Pen Needle 33G X 4 MM MISC 1 Dose by Does not apply route 3 (three) times daily before meals. 05/13/21   Loletha Grayer, MD  predniSONE (DELTASONE) 10 MG tablet Three tabs po daily for  five days Patient not taking: No sig reported 05/13/21   Loletha Grayer, MD       Ottie Glazier, M.D.  Pulmonary & Critical Care Medicine

## 2021-06-08 NOTE — Progress Notes (Signed)
Physical Therapy Treatment Patient Details Name: Dylan Pittman MRN: [id number] DOB: January 29, 1951 Today's Date: 06/08/2021   History of Present Illness Patient is a 70 year old male presenting to [facility] ED from home on 05/30/2021 with complaints of progressive hypoxia at rest/with exertion and dyspnea with exertion.  Of note patient was recently hospitalized with COVID-19 pneumonia from 05/09/2021 to 05/13/2021.    PT Comments    Pt received upright in recliner agreeable to PT services. Pt resting in low 90's SPO2 on HFNC on 7 L/min. Titrated to 10 L/min for ambulation. Pt performed 160' with desat to 88%. Returned to room, pt performed 2x5 STS at EOB with good form/technique. Dsat to 88% on first set and remained at 90% on second set. Only requiring ~45 sec to return to > 90%. Further progression of mobility with 3 more laps of 160' around unit with x1 standing rest per lap due to desat to 86-87% requiring PLB and 1 min rest to return to 88-90% prior to begin ambulation. Upon returning to room and seated rest pt did desat to 78% with coughing requiring ~3-4 min to recover to 89-90%. Hr reamins 108-113 BPM throughout. Pt extremely motivated to progress ambulation and endurance and aware of O2 needs and importance of monitoring SPO2 levels. HH PT remains appropriate to build endurance with functional mobility.    Recommendations for follow up therapy are one component of a multi-disciplinary discharge planning process, led by the attending physician.  Recommendations may be updated based on patient status, additional functional criteria and insurance authorization.  Follow Up Recommendations  Home health PT     Equipment Recommendations  None recommended by PT    Recommendations for Other Services       Precautions / Restrictions Restrictions Weight Bearing Restrictions: No     Mobility  Bed Mobility Overal bed mobility: Modified Independent                  Transfers Overall  transfer level: Modified independent Equipment used: None                Ambulation/Gait Ambulation/Gait assistance: Supervision Gait Distance (Feet): 640 Feet (160'x4) Assistive device: None Gait Pattern/deviations: Step-through pattern;Decreased stride length Gait velocity: decreased   General Gait Details: Amb 4x160 with ith supervision on 10 L/min [place]. SPO2 dropped to 86-87% at lowest throughout session. Ultimately desatted to 78% at end of session which took 2-3 min seated rest to reach 90%.   Stairs             Wheelchair Mobility    Modified Rankin (Stroke Patients Only)       Balance Overall balance assessment: Modified Independent Sitting-balance support: Feet supported Sitting balance-Leahy Scale: Normal     Standing balance support: No upper extremity supported;During functional activity Standing balance-Leahy Scale: Good                              Cognition Arousal/Alertness: Awake/alert Behavior During Therapy: WFL for tasks assessed/performed Overall Cognitive Status: Within Functional Limits for tasks assessed                                        Exercises Other Exercises Other Exercises: 2x5 sit to stand. Re-education on importance of PLB.    General Comments General comments (skin integrity, edema, etc.): At rest SPO2 in low  90's 7 L HFNC. Titrated to 10 L HFNC with ambulation. Remains having cough fits casuing brief desat.      Pertinent Vitals/Pain Pain Assessment: No/denies pain    Home Living                      Prior Function            PT Goals (current goals can now be found in the care plan section) Acute Rehab PT Goals Patient Stated Goal: to go home PT Goal Formulation: With patient Time For Goal Achievement: 06/20/21 Potential to Achieve Goals: Good Progress towards PT goals: Progressing toward goals    Frequency    Min 2X/week      PT Plan Current plan remains  appropriate    Co-evaluation              AM-PAC PT "6 Clicks" Mobility   Outcome Measure  Help needed turning from your back to your side while in a flat bed without using bedrails?: None Help needed moving from lying on your back to sitting on the side of a flat bed without using bedrails?: None Help needed moving to and from a bed to a chair (including a wheelchair)?: None Help needed standing up from a chair using your arms (e.g., wheelchair or bedside chair)?: None Help needed to walk in hospital room?: None Help needed climbing 3-5 steps with a railing? : A Little 6 Click Score: 23    End of Session Equipment Utilized During Treatment: Oxygen Activity Tolerance: Patient tolerated treatment well Patient left: in chair Nurse Communication: Mobility status PT Visit Diagnosis: Difficulty in walking, not elsewhere classified (R26.2)     Time: 0263-7858 PT Time Calculation (min) (ACUTE ONLY): 28 min  Charges:  $Therapeutic Exercise: 23-37 mins                    Salem Caster. Fairly IV, PT, DPT Physical Therapist- [facility]  06/08/2021, 12:03 PM

## 2021-06-09 DIAGNOSIS — J9621 Acute and chronic respiratory failure with hypoxia: Secondary | ICD-10-CM | POA: Diagnosis not present

## 2021-06-09 LAB — BASIC METABOLIC PANEL
Anion gap: 7 (ref 5–15)
BUN: 16 mg/dL (ref 8–23)
CO2: 36 mmol/L — ABNORMAL HIGH (ref 22–32)
Calcium: 8.2 mg/dL — ABNORMAL LOW (ref 8.9–10.3)
Chloride: 92 mmol/L — ABNORMAL LOW (ref 98–111)
Creatinine, Ser: 0.59 mg/dL — ABNORMAL LOW (ref 0.61–1.24)
GFR, Estimated: >60 mL/min (ref >60)
Glucose, Bld: 186 mg/dL — ABNORMAL HIGH (ref 70–99)
Potassium: 4 mmol/L (ref 3.5–5.1)
Sodium: 135 mmol/L (ref 135–145)

## 2021-06-09 LAB — CBC
HCT: 37.5 % — ABNORMAL LOW (ref 39.0–52.0)
Hemoglobin: 12.6 g/dL — ABNORMAL LOW (ref 13.0–17.0)
MCH: 31.9 pg (ref 26.0–34.0)
MCHC: 33.6 g/dL (ref 30.0–36.0)
MCV: 94.9 fL (ref 80.0–100.0)
Platelets: 223 10*3/uL (ref 150–400)
RBC: 3.95 MIL/uL — ABNORMAL LOW (ref 4.22–5.81)
RDW: 12.9 % (ref 11.5–15.5)
WBC: 12.3 10*3/uL — ABNORMAL HIGH (ref 4.0–10.5)
nRBC: 0 % (ref 0.0–0.2)

## 2021-06-09 LAB — GLUCOSE, CAPILLARY
Glucose-Capillary: 202 mg/dL — ABNORMAL HIGH (ref 70–99)
Glucose-Capillary: 90 mg/dL (ref 70–99)
Glucose-Capillary: 352 mg/dL — ABNORMAL HIGH (ref 70–99)
Glucose-Capillary: 270 mg/dL — ABNORMAL HIGH (ref 70–99)

## 2021-06-09 MED ORDER — INSULIN GLARGINE-YFGN 100 UNIT/ML ~~LOC~~ SOLN
34.0000 [IU] | Freq: Two times a day (BID) | SUBCUTANEOUS | Status: DC
  Administered 2021-06-09 – 2021-06-11 (×4): 34 [IU] via SUBCUTANEOUS
  Filled 2021-06-09 (×5): qty 0.34

## 2021-06-09 MED ORDER — INSULIN ASPART 100 UNIT/ML IJ SOLN
10.0000 [IU] | Freq: Three times a day (TID) | INTRAMUSCULAR | Status: DC
  Administered 2021-06-09 – 2021-06-14 (×16): 10 [IU] via SUBCUTANEOUS
  Filled 2021-06-09 (×16): qty 1

## 2021-06-09 NOTE — Progress Notes (Signed)
Dylan Pittman  UXL:[id number] DOB: 08/27/1951 DOA: 05/30/2021 PCP: Sofie Hartigan, MD    Brief Narrative:  70 year old with a history of pulmonary fibrosis, HTN, HLD, DM2, obstructive sleep apnea, prostate cancer status post seed ablation and Lupron, BPH, and GERD who was admitted after presenting to the [facility] ED from home 10/3 with progressive hypoxia at rest and severe dyspnea on exertion.  The patient had been hospitalized with COVID pneumonia 9/12 >05/13/2021.  He stated that his dyspnea only worsened after being discharged home with increasing oxygen support requirements each day.  He noted his saturations dropping into the 50-60% range with exertion.  Significant Events:  10/3 admit via ED - required Mona 10/6 weaning of [place]  10/8 weaned to nasal cannula at 10 L [place]  10/12 weaned to 7L, symptomatically improving  Consultants:  PCCM  Code Status: FULL CODE  Antimicrobials:  Azithromycin 10/5 >  Cefepime 10/3 > 10/4 Vancomycin 10/3 > 10/5  DVT prophylaxis: Lovenox  Subjective: No new events reported overnight.  Remains dyspneic with exertion, this is improving. Otherwise feels well  Assessment & Plan:  Acute on chronic hypoxic respiratory failure - Post CoViD ILD / Pulm Fibrosis - ILD w/ acute exacerbation - possible Legionella  Care per Pulmonary -weaning oxygen as able - pulm will start weaning steroids in a few days- still requiring high level O2 support but decreasing slowly- Legionella Ag negative but Ab+ therefore DFA and culture ordered per Pulm, continuing azithromcyin for now. Continuing mucomyst, breo  DM2 CBG mild elevations fasting, pre-prandial wnl. Increase  semglee 34 bid, decrease mealtime to 10 w/ meals, continue ssi  Anal fissure v/s small hemorrhoid - scant BRBPR resolved Keep lubricated w/ vaseline or neosporin - trial of anusol suppository - keep stools loose - monitor   BPH Continue usual home medical therapy  HLD Continue usual home  medical therapy  HTN Blood pressure currently well controlled  Family Communication: none @ bedside  Remains inpatient appropriate because:Inpatient level of care appropriate due to severity of illness  Dispo: The patient is from: Home              Anticipated d/c is to: Home w/ home health PT              Patient currently is not medically stable to d/c.   Difficult to place patient No  Objective: Blood pressure 121/70, pulse 100, temperature 97.7 F (36.5 C), temperature source Oral, resp. rate 18, height 5\' 8"  (1.727 m), weight 101.1 kg, SpO2 (!) 84 %.  Intake/Output Summary (Last 24 hours) at 06/09/2021 1324 Last data filed at 06/09/2021 1200 Gross per 24 hour  Intake 840 ml  Output 1825 ml  Net -985 ml   Filed Weights   06/05/21 0500 06/06/21 0258 06/07/21 0500  Weight: 98.6 kg 100.6 kg 101.1 kg    Examination: General: No acute respiratory distress but requiring high-level oxygen support Lungs: Poor air movement diffusely with very fine faint crackles appreciated throughout all fields with no wheezing Cardiovascular: Regular rate and rhythm without murmur  Abdomen: Nontender, nondistended, soft, bowel sounds positive, no rebound Extremities: Trace bilateral lower extremity edema  CBC: Recent Labs  Lab 06/04/21 0441 06/05/21 0612 06/06/21 0449 06/09/21 0517  WBC 9.9 9.8 10.2 12.3*  NEUTROABS 5.9  --   --   --   HGB 13.2 13.2 13.2 12.6*  HCT 39.1 39.5 40.8 37.5*  MCV 92.4 93.4 93.2 94.9  PLT 358 319 328 223  Basic Metabolic Panel: Recent Labs  Lab 06/04/21 0441 06/05/21 0612 06/06/21 0449 06/09/21 0517  NA 138 139 136 135  K 3.8 3.9 4.1 4.0  CL 95* 93* 93* 92*  CO2 36* 37* 38* 36*  GLUCOSE 150* 125* 242* 186*  BUN 17 18 18 16   CREATININE 0.54* 0.45* 0.72 0.59*  CALCIUM 8.4* 8.3* 8.3* 8.2*  MG 2.2 2.2 2.3  --   PHOS 4.2 4.6 4.6  --    GFR: Estimated Creatinine Clearance: 101.9 mL/min (A) (by C-G formula based on SCr of 0.59 mg/dL  (L)).  Liver Function Tests: No results for input(s): AST, ALT, ALKPHOS, BILITOT, PROT, ALBUMIN in the last 168 hours.   HbA1C: Hgb A1c MFr Bld  Date/Time Value Ref Range Status  05/09/2021 06:18 AM 8.6 (H) 4.8 - 5.6 % Final    Comment:    (NOTE) Pre diabetes:          5.7%-6.4%  Diabetes:              >6.4%  Glycemic control for   <7.0% adults with diabetes     CBG: Recent Labs  Lab 06/08/21 1247 06/08/21 1603 06/08/21 2106 06/09/21 0844 06/09/21 1147  GLUCAP 140* 217* 222* 202* 90    Recent Results (from the past 240 hour(s))  Blood culture (single)     Status: None   Collection Time: 05/30/21  2:52 PM   Specimen: BLOOD  Result Value Ref Range Status   Specimen Description BLOOD LEFT ANTECUBITAL  Final   Special Requests   Final    BOTTLES DRAWN AEROBIC AND ANAEROBIC Blood Culture adequate volume   Culture   Final    NO GROWTH 5 DAYS Performed at [facility], [address] [place], [place] [address]    Report Status 06/04/2021 FINAL  Final  Respiratory (~20 pathogens) panel by PCR     Status: None   Collection Time: 05/30/21  5:32 PM   Specimen: Nasopharyngeal Swab; Respiratory  Result Value Ref Range Status   Adenovirus NOT DETECTED NOT DETECTED Final   Coronavirus 229E NOT DETECTED NOT DETECTED Final    Comment: (NOTE) The Coronavirus on the Respiratory Panel, DOES NOT test for the novel  Coronavirus (2019 nCoV)    Coronavirus HKU1 NOT DETECTED NOT DETECTED Final   Coronavirus NL63 NOT DETECTED NOT DETECTED Final   Coronavirus OC43 NOT DETECTED NOT DETECTED Final   Metapneumovirus NOT DETECTED NOT DETECTED Final   Rhinovirus / Enterovirus NOT DETECTED NOT DETECTED Final   Influenza A NOT DETECTED NOT DETECTED Final   Influenza B NOT DETECTED NOT DETECTED Final   Parainfluenza Virus 1 NOT DETECTED NOT DETECTED Final   Parainfluenza Virus 2 NOT DETECTED NOT DETECTED Final   Parainfluenza Virus 3 NOT DETECTED NOT DETECTED Final    Parainfluenza Virus 4 NOT DETECTED NOT DETECTED Final   Respiratory Syncytial Virus NOT DETECTED NOT DETECTED Final   Bordetella pertussis NOT DETECTED NOT DETECTED Final   Bordetella Parapertussis NOT DETECTED NOT DETECTED Final   Chlamydophila pneumoniae NOT DETECTED NOT DETECTED Final   Mycoplasma pneumoniae NOT DETECTED NOT DETECTED Final    Comment: Performed at [facility], [address] [place], [place] [address]  MRSA Next Gen by PCR, Nasal     Status: None   Collection Time: 05/31/21  2:36 PM   Specimen: Nasal Mucosa; Nasal Swab  Result Value Ref Range Status   MRSA by PCR Next Gen NOT DETECTED NOT DETECTED Final  Comment: (NOTE) The GeneXpert MRSA Assay (FDA approved for NASAL specimens only), is one component of a comprehensive MRSA colonization surveillance program. It is not intended to diagnose MRSA infection nor to guide or monitor treatment for MRSA infections. Test performance is not FDA approved in patients less than 69 years old. Performed at [facility], [address] [place], [place] [address]      Scheduled Meds:  acetylcysteine  4 mL Nebulization BID   ascorbic acid  500 mg Oral Daily   azithromycin  500 mg Oral Daily   Chlorhexidine Gluconate Cloth  6 each Topical Q0600   chlorpheniramine-HYDROcodone  5 mL Oral Q4H   enoxaparin (LOVENOX) injection  0.5 mg/kg Subcutaneous Q24H   fluticasone furoate-vilanterol  1 puff Inhalation Daily   furosemide  20 mg Oral Daily   gabapentin  300 mg Oral TID   hydrocortisone  25 mg Rectal BID   insulin aspart  0-20 Units Subcutaneous TID AC & HS   insulin aspart  10 Units Subcutaneous TID WC   insulin glargine-yfgn  34 Units Subcutaneous BID   ipratropium-albuterol  3 mL Nebulization Q6H   neomycin-bacitracin-polymyxin   Topical BID   pantoprazole  40 mg Oral BID   [START ON 06/13/2021] predniSONE  40 mg Oral Q breakfast   Followed by   Derrill Memo ON 06/20/2021] predniSONE  30 mg Oral Q breakfast    Followed by   Derrill Memo ON 06/27/2021] predniSONE  20 mg Oral Q breakfast   Followed by   Derrill Memo ON 07/04/2021] predniSONE  10 mg Oral Q breakfast   predniSONE  50 mg Oral Q breakfast   rosuvastatin  5 mg Oral Q M,W,F   tamsulosin  0.4 mg Oral Daily   zinc sulfate  220 mg Oral Daily      LOS: 10 days   Laurey Arrow, MD   If 7PM-7AM, please contact night-coverage per Amion 06/09/2021, 1:24 PM

## 2021-06-09 NOTE — Progress Notes (Signed)
NAME:  Dylan Pittman, MRN:  [id number], DOB:  1950/09/11, LOS: 31 ADMISSION DATE:  05/30/2021, CONSULTATION DATE:  05/30/21 REFERRING MD:  Dr. Creig Hines, CHIEF COMPLAINT:   Shortness of breath  History of Present Illness:  70 year old male presenting to [facility] ED from home on 05/30/2021 with complaints of progressive hypoxia at rest/with exertion and dyspnea with exertion.  Of note patient was recently hospitalized with COVID-19 pneumonia from 05/09/2021 to 05/13/2021.  He describes that since discharge he now wears continuous oxygen via nasal cannula.  He started at 2 L at rest and 4 L with ambulation, but reports he has had to increase to 5 L nasal cannula at rest which maintains his SPO2 greater than 90%.  He reports with any exertion his SPO2 drops into the 50s to 60%.  Earlier today he took a shower with his wife's assistance his oxygen dropped to 58% and it took 45 minutes for him to recover above 90% with his nasal cannula.  He does admit to feeling dizzy today correlating with his hypoxia, but denies any recent falls or injuries. He does have a CPAP machine to wear at night for his OSA, but admits that since discharge there was equipment difficulties and he has not been able to use it consistently.  He complains of persistent coughing spells with mostly clear phlegm, however earlier today he did notice some red streaks in his sputum.  He denies chest pain, nausea/vomiting/diarrhea/abdominal pain, fever/chills, or sore throat.      Pertinent  Medical History  Pulmonary fibrosis Hypertension Hyperlipidemia Type 2 diabetes Obstructive sleep apnea COVID-19 (September 2022) Prostate cancer status post seed ablation and Lupron injections BPH GERD Bronchitis Significant Hospital Events: Including procedures, antibiotic start and stop dates in addition to other pertinent events   05/30/2021: Admit to ICU on heated high flow nasal cannula at 75%/40 L in the secondary to suspected persistent pneumonia  from recent COVID-19 infection in the setting of chronic interstitial lung disease 06/01/21- Patient  is improved, reviewed medical plan with patient and wife 06/02/21-patient weaned to 50%/45L/min continue steroids , repeat blood work.  06/03/21- patient improved, met with wife and reviewed care plan today.  Legionella + on ab test but Urine negative suggestive of serotype 2 will order DFA and legionella culture. Increased zithromax to 500 daily 06/04/21- continue current care plan , patient is improved and weaned to 10L/min [place] 06/05/21- patient improved on supplemental O2, met with wife and reviewed care plan. Wil transfer to med surg with tele and start PT/OT 06/06/21- Patient is slightly improved, he is still coughing a lot and states the codiene cough suryp helps but not lasting but two hours so I have increased frequncely.  He still has high amount of phlegm on expectoration but inspissated unable to expectorate.  We have initiated mucomyst therapy today. Continue PT/OT.  Reviewed care plan with RT Beverlee Nims.  Met with friend and pastor of patient by name of Edmon Crape.  06/07/21- patient improved, weaned to 9L/min [place].  Worked well with mucomyst states he was able to expectorate copious phlegm.   06/08/21- cough is 80% less, patient ambulating 4 laps around hallways, FiO2 reduced to 7L/min.  Reduced rhonchi bilaterally on auscultation.  Will continue mucmoyst one more day and keep prednisone at 76m for one more day before weaning 06/09/21- patient improved he is on 6L/min.  Shares mucomyst is helping with expectoration of thickened phlegm.   Objective   Blood pressure 132/75, pulse 80, temperature 97.9  F (36.6 C), temperature source Oral, resp. rate 18, height 5' 8" (1.727 m), weight 101.1 kg, SpO2 98 %.        Intake/Output Summary (Last 24 hours) at 06/09/2021 0738 Last data filed at 06/09/2021 0430 Gross per 24 hour  Intake 600 ml  Output 750 ml  Net -150 ml    Filed Weights   06/05/21  0500 06/06/21 0258 06/07/21 0500  Weight: 98.6 kg 100.6 kg 101.1 kg    Examination: General: Adult male, acutely ill, lying in bed, NAD HEENT: MM pink/moist, anicteric, atraumatic, neck supple Neuro: A&O x 4, able to follow commands, PERRL +3, MAE CV: s1s2 RRR, sinus tachycardia on monitor, no r/m/g Pulm: Regular, mildly labored/dyspneic at rest on HHFNC55% breath sounds coarse/diminished-BUL & diminished-BLL GI: soft, rounded, non tender, bs x 4 Skin: Limited exam-patient still in street clothes> no rashes/lesions noted Extremities: warm/dry, pulses + 2 R/P, trace edema noted BLE  Resolved Hospital Problem list     Assessment & Plan:  Acute Hypoxic Respiratory Failure       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          -Legionella Ab + , UG neg, DFA and legionella culture ordered - Continue HHFNC, wean FiO2 as tolerated > may need to transition to BIPAP overnight d/t OSA - Supplemental O2 to maintain SpO2 > 88% - Intermittent chest x-ray & ABG PRN - Ensure adequate pulmonary hygiene: IS Q 1 h x 10 while awake - F/u cultures, Respiratory 20 pathogen panel pending, f/u Strep pna antigen - trend PCT, daily CBC- monitor WBC/fever curve - PCT elevated at 1.15, will continue HCAP coverage: cefepime & Vancomycin - Breo-Ellipta inhaler BID, scheduled Duo-Nebs & bronchodilators PRN - Solu-medrol 40 mg BID>>>>once daily >>Prednisone 36m 10/9 - cxr in am reviewed with patient 06/06/21 - continue home Vitamin C & Zinc  Type 2 Diabetes Mellitus At Risk for Steroid Induced Hyperglycemia - Monitor CBG AC, HS - SSI moderate dosing, continue home long-acting coverage (solostar 40 units daily) >> Lantus 20 units BID - target range while in ICU: 140-180 - follow ICU hyper/hypo-glycemia protocol - Carb-modified diet in place  BPH - continue home flomax - strict I & O's, monitor for urinary retention  Hyperlipidemia  Hypertension - continue home  rosuvastatin - due to SIRS response to suspected Pneumonia with marginal-normal BP, will hold outpatient anti-hypertensives. Consider restarting: Losartan as patient stabilizes - PRN hydralazine if SBP > 160  Best Practice (right click and "Reselect all SmartList Selections" daily)  Diet/type: Regular consistency (see orders) DVT prophylaxis: LMWH GI prophylaxis: PPI Lines: N/A Foley:  N/A Code Status:  full code Last date of multidisciplinary goals of care discussion [05/30/2021]  IMAGING   Recent Labs  Lab 06/03/21 0512 06/04/21 0441 06/05/21 0612 06/06/21 0449 06/09/21 0517  WBC 7.6 9.9 9.8 10.2 12.3*  NEUTROABS  --  5.9  --   --   --   HGB 12.3* 13.2 13.2 13.2 12.6*  HCT 37.1* 39.1 39.5 40.8 37.5*  MCV 90.5 92.4 93.4 93.2 94.9  PLT 329 358 319 328 223     Basic Metabolic Panel: Recent Labs  Lab 06/03/21 0512 06/04/21 0441 06/05/21 0612 06/06/21 0449 06/09/21 0517  NA 137 138 139 136 135  K 4.2 3.8 3.9 4.1 4.0  CL 96* 95* 93* 93* 92*  CO2 34* 36* 37* 38* 36*  GLUCOSE 259* 150* 125* 242* 186*  BUN _0 16  CREATININE 0.52* 0.54* 0.45* 0.72 0.59*  CALCIUM 8.5* 8.4* 8.3* 8.3* 8.2*  MG  --  2.2 2.2 2.3  --   PHOS  --  4.2 4.6 4.6  --     GFR: Estimated Creatinine Clearance: 101.9 mL/min (A) (by C-G formula based on SCr of 0.59 mg/dL (L)). Recent Labs  Lab 06/04/21 0441 06/05/21 0612 06/06/21 0449 06/09/21 0517  WBC 9.9 9.8 10.2 12.3*     Liver Function Tests: No results for input(s): AST, ALT, ALKPHOS, BILITOT, PROT, ALBUMIN in the last 168 hours.  No results for input(s): LIPASE, AMYLASE in the last 168 hours. No results for input(s): AMMONIA in the last 168 hours.  ABG No results found for: PHART, PCO2ART, PO2ART, HCO3, TCO2, ACIDBASEDEF, O2SAT   Coagulation Profile: No results for input(s): INR, PROTIME in the last 168 hours.  Cardiac Enzymes: No results for input(s): CKTOTAL, CKMB, CKMBINDEX, TROPONINI in the last 168  hours.  HbA1C: Hgb A1c MFr Bld  Date/Time Value Ref Range Status  05/09/2021 06:18 AM 8.6 (H) 4.8 - 5.6 % Final    Comment:    (NOTE) Pre diabetes:          5.7%-6.4%  Diabetes:              >6.4%  Glycemic control for   <7.0% adults with diabetes     CBG: Recent Labs  Lab 06/07/21 2118 06/08/21 0806 06/08/21 1247 06/08/21 1603 06/08/21 2106  GLUCAP 186* 157* 140* 217* 222*     Review of Systems: Positives in BOLD   Gen: Denies fever, chills, weight change, fatigue, night sweats HEENT: Denies blurred vision, double vision, hearing loss, tinnitus, sinus congestion, rhinorrhea, sore throat, neck stiffness, dysphagia PULM: Denies shortness of breath, cough, sputum production, hemoptysis, wheezing CV: Denies chest pain, edema, orthopnea, paroxysmal nocturnal dyspnea, palpitations GI: Denies abdominal pain, nausea, vomiting, diarrhea, hematochezia, melena, constipation, change in bowel habits GU: Denies dysuria, hematuria, polyuria, oliguria, urethral discharge Endocrine: Denies hot or cold intolerance, polyuria, polyphagia or appetite change Derm: Denies rash, dry skin, scaling or peeling skin change Heme: Denies easy bruising, bleeding, bleeding gums Neuro: Denies headache, numbness, weakness, slurred speech, loss of memory or consciousness  Past Medical History:  He,  has a past medical history of Chronic airway obstruction (HCC), DDD (degenerative disc disease), lumbar, Dupuytren's disease, Dyslipidemia (04/07/2014), Esophageal reflux (04/20/2014), Hypertension (04/20/2014), Microalbuminuria (04/07/2014), Microalbuminuria, Obesity, unspecified (04/07/2014), Sleep apnea (04/20/2014), and 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   Surgical History:   Past Surgical History:  Procedure Laterality Date   BACK SURGERY  1992, 2005   CLOSED REDUCTION FINGER WITH PERCUTANEOUS PINNING Left 02/12/2020   Procedure: Closed reduction and pinning of left first  metacarpal fracture with possible open reduction;  Surgeon: Hessie Knows, MD;  Location: [facility] ORS;  Service: Orthopedics;  Laterality: Left;   COLONOSCOPY WITH PROPOFOL N/A 12/28/2017   Procedure: COLONOSCOPY WITH PROPOFOL;  Surgeon: Manya Silvas, MD;  Location: [facility] ENDOSCOPY;  Service: Endoscopy;  Laterality: N/A;   NASAL SEPTUM SURGERY     RADIOACTIVE SEED IMPLANT N/A 04/12/2021   Procedure: RADIOACTIVE SEED IMPLANT/BRACHYTHERAPY IMPLANT;  Surgeon: Abbie Sons, MD;  Location: [facility] ORS;  Service: Urology;  Laterality: N/A;  73 seeds implanted   VASECTOMY  2005     Social History:   reports that he has quit smoking. He has never used smokeless tobacco. He reports that he does not currently use alcohol. He reports that he does not currently use  drugs.   Family History:  His family history is negative for Prostate cancer and Kidney cancer.   Allergies No Known Allergies   Home Medications  Prior to Admission medications   Medication Sig Start Date End Date Taking? Authorizing Provider  albuterol (VENTOLIN HFA) 108 (90 Base) MCG/ACT inhaler Inhale 2 puffs into the lungs every 6 (six) hours as needed for wheezing or shortness of breath. 01/20/20  Yes [provider]  ascorbic acid (VITAMIN C) 1000 MG tablet Take 0.5 tablets (500 mg total) by mouth daily. 05/14/21  Yes Wieting, Richard, MD  cholecalciferol (VITAMIN D) 25 MCG (1000 UNIT) tablet Take 1,000 Units by mouth in the morning.   Yes [provider]  fluticasone-salmeterol (ADVAIR HFA) 115-21 MCG/ACT inhaler Inhale 2 puffs into the lungs 2 (two) times daily.   Yes [provider]  furosemide (LASIX) 20 MG tablet Take 20 mg by mouth daily. 05/26/21  Yes [provider]  gabapentin (NEURONTIN) 300 MG capsule Take 300 mg by mouth 3 (three) times daily. 05/26/21  Yes [provider]  insulin aspart (NOVOLOG) 100 UNIT/ML FlexPen Inject 8 Units into the skin 3 (three) times daily with meals.  Okay to substitute generic 05/13/21  Yes Wieting, Richard, MD  losartan (COZAAR) 50 MG tablet Take 50 mg by mouth in the morning. 10/07/17  Yes [provider]  OZEMPIC, 1 MG/DOSE, 4 MG/3ML SOPN Inject 1 mg into the skin every Sunday. 07/29/19  Yes [provider]  pantoprazole (PROTONIX) 40 MG tablet Take 40 mg by mouth 2 (two) times daily. 10/19/17  Yes [provider]  pioglitazone (ACTOS) 30 MG tablet Take 30 mg by mouth in the morning. 07/27/19  Yes [provider]  rosuvastatin (CRESTOR) 5 MG tablet Take 5 mg by mouth every Monday, Wednesday, and Friday. In the morning 04/19/18  Yes [provider]  tamsulosin (FLOMAX) 0.4 MG CAPS capsule Take 1 capsule (0.4 mg total) by mouth daily. 03/25/21  Yes Vaillancourt, Samantha, PA-C  TOUJEO MAX SOLOSTAR 300 UNIT/ML Solostar Pen Inject 50 Units into the skin in the morning. Patient taking differently: Inject 40 Units into the skin in the morning. 05/13/21  Yes Wieting, Richard, MD  XIGDUO XR 12-998 MG TB24 Take 2 tablets by mouth every morning. 01/31/21  Yes [provider]  zinc sulfate 220 (50 Zn) MG capsule Take 1 capsule (220 mg total) by mouth daily. 05/14/21  Yes Wieting, Richard, MD  aspirin EC 81 MG tablet Take 81 mg by mouth in the morning. Swallow whole. Patient not taking: Reported on 05/30/2021    [provider]  chlorpheniramine-HYDROcodone (TUSSIONEX) 10-8 MG/5ML SUER Take 5 mLs by mouth every 12 (twelve) hours as needed for cough. Patient not taking: No sig reported 05/13/21   Loletha Grayer, MD  feeding supplement, GLUCERNA SHAKE, (GLUCERNA SHAKE) LIQD Take 237 mLs by mouth 2 (two) times daily between meals. 05/13/21   Loletha Grayer, MD  fluticasone-salmeterol (ADVAIR HFA) 706-23 MCG/ACT inhaler Inhale 2 puffs into the lungs 2 (two) times daily. Rinse mouth out with water after use Patient not taking: No sig reported 05/13/21   Loletha Grayer, MD  Insulin Pen Needle 33G X 4 MM  MISC 1 Dose by Does not apply route 3 (three) times daily before meals. 05/13/21   Loletha Grayer, MD  predniSONE (DELTASONE) 10 MG tablet Three tabs po daily for five days Patient not taking: No sig reported 05/13/21   Loletha Grayer, MD   , M.D.  Pulmonary & Critical Care Medicine         

## 2021-06-09 NOTE — Progress Notes (Signed)
Pt weaned to 5lpm [place], sats 95% while sitting in bed. Pt got up to go to trash can and sats dropped to 56%, pt was placed on 100% non rebreather, sats increased to 97% and was then transitioned back to 5lpm [place], sats 95%. NRB left on at bedside for patient to use when ambulating in room. RN made aware.

## 2021-06-10 DIAGNOSIS — J9621 Acute and chronic respiratory failure with hypoxia: Secondary | ICD-10-CM | POA: Diagnosis not present

## 2021-06-10 LAB — GLUCOSE, CAPILLARY
Glucose-Capillary: 144 mg/dL — ABNORMAL HIGH (ref 70–99)
Glucose-Capillary: 154 mg/dL — ABNORMAL HIGH (ref 70–99)
Glucose-Capillary: 157 mg/dL — ABNORMAL HIGH (ref 70–99)
Glucose-Capillary: 316 mg/dL — ABNORMAL HIGH (ref 70–99)
Glucose-Capillary: 252 mg/dL — ABNORMAL HIGH (ref 70–99)

## 2021-06-10 LAB — LEGIONELLA, DFA (W/OUT CULTURE): Legionella Pneumophila DFA: NEGATIVE

## 2021-06-10 MED ORDER — PREDNISONE 50 MG PO TABS
45.0000 mg | ORAL_TABLET | Freq: Every day | ORAL | Status: DC
  Administered 2021-06-11: 45 mg via ORAL
  Filled 2021-06-10: qty 1

## 2021-06-10 NOTE — Progress Notes (Addendum)
Dylan Pittman  HYW:[id number] DOB: [birth year] DOA: 05/30/2021 PCP: Sofie Hartigan, MD    Brief Narrative:  70 year old with a history of pulmonary fibrosis, HTN, HLD, DM2, obstructive sleep apnea, prostate cancer status post seed ablation and Lupron, BPH, and GERD who was admitted after presenting to the [facility] ED from home 10/3 with progressive hypoxia at rest and severe dyspnea on exertion.  The patient had been hospitalized with COVID pneumonia 9/12 >05/13/2021.  He stated that his dyspnea only worsened after being discharged home with increasing oxygen support requirements each day.  He noted his saturations dropping into the 50-60% range with exertion.  Significant Events:  10/3 admit via ED - required [place] 10/6 weaning of [place]  10/8 weaned to nasal cannula at 10 L [place]  10/12 weaned to 7L, symptomatically improving  Consultants:  PCCM  Code Status: FULL CODE  Antimicrobials:  Azithromycin 10/5 >  Cefepime 10/3 > 10/4 Vancomycin 10/3 > 10/5  DVT prophylaxis: Lovenox  Subjective: No new events reported overnight.  Remains dyspneic with exertion, this is improving. Otherwise feels well  Assessment & Plan:  Acute on chronic hypoxic respiratory failure - Post CoViD ILD / Pulm Fibrosis - ILD w/ acute exacerbation - possible Legionella  Care per Pulmonary -weaning oxygen as able - pulm will start weaning steroids in a few days- still requiring high level O2 support but decreasing slowly- Legionella Ag negative but Ab+ therefore DFA and culture ordered per Pulm, continuing azithromcyin for now. Continuing mucomyst, breo. Today day 10 of azith, will speak w/ pulm about discontinuing  DM2 CBG mild elevations fasting, pre-prandial wnl. Increased semglee 34 bid, decreased mealtime to 10 w/ meals, continue ssi  Anal fissure v/s small hemorrhoid - scant BRBPR resolved Keep lubricated w/ vaseline or neosporin - trial of anusol suppository - keep stools loose - monitor    BPH Continue usual home medical therapy  HLD Continue usual home medical therapy  HTN Blood pressure currently well controlled  Family Communication: wife updated telephonically 10/14  Remains inpatient appropriate because:Inpatient level of care appropriate due to severity of illness  Dispo: The patient is from: Home              Anticipated d/c is to: Home w/ home health PT              Patient currently is not medically stable to d/c.   Difficult to place patient No  Objective: Blood pressure 138/68, pulse 95, temperature 98.3 F (36.8 C), temperature source Oral, resp. rate 18, height 5\' 8"  (1.727 m), weight 101.1 kg, SpO2 99 %.  Intake/Output Summary (Last 24 hours) at 06/10/2021 1153 Last data filed at 06/10/2021 0900 Gross per 24 hour  Intake 480 ml  Output 2150 ml  Net -1670 ml   Filed Weights   06/05/21 0500 06/06/21 0258 06/07/21 0500  Weight: 98.6 kg 100.6 kg 101.1 kg    Examination: General: No acute respiratory distress but requiring high-level oxygen support Lungs: Poor air movement diffusely with very fine faint crackles appreciated throughout all fields with no wheezing Cardiovascular: Regular rate and rhythm without murmur  Abdomen: Nontender, nondistended, soft, bowel sounds positive, no rebound Extremities: Trace bilateral lower extremity edema  CBC: Recent Labs  Lab 06/04/21 0441 06/05/21 0612 06/06/21 0449 06/09/21 0517  WBC 9.9 9.8 10.2 12.3*  NEUTROABS 5.9  --   --   --   HGB 13.2 13.2 13.2 12.6*  HCT 39.1 39.5 40.8 37.5*  MCV 92.4 93.4  93.2 94.9  PLT 358 319 328 469   Basic Metabolic Panel: Recent Labs  Lab 06/04/21 0441 06/05/21 0612 06/06/21 0449 06/09/21 0517  NA 138 139 136 135  K 3.8 3.9 4.1 4.0  CL 95* 93* 93* 92*  CO2 36* 37* 38* 36*  GLUCOSE 150* 125* 242* 186*  BUN 17 18 18 16   CREATININE 0.54* 0.45* 0.72 0.59*  CALCIUM 8.4* 8.3* 8.3* 8.2*  MG 2.2 2.2 2.3  --   PHOS 4.2 4.6 4.6  --    GFR: Estimated  Creatinine Clearance: 101.9 mL/min (A) (by C-G formula based on SCr of 0.59 mg/dL (L)).  Liver Function Tests: No results for input(s): AST, ALT, ALKPHOS, BILITOT, PROT, ALBUMIN in the last 168 hours.   HbA1C: Hgb A1c MFr Bld  Date/Time Value Ref Range Status  05/09/2021 06:18 AM 8.6 (H) 4.8 - 5.6 % Final    Comment:    (NOTE) Pre diabetes:          5.7%-6.4%  Diabetes:              >6.4%  Glycemic control for   <7.0% adults with diabetes     CBG: Recent Labs  Lab 06/09/21 0844 06/09/21 1147 06/09/21 1640 06/09/21 2009 06/10/21 0754  GLUCAP 202* 90 352* 270* 144*    Recent Results (from the past 240 hour(s))  MRSA Next Gen by PCR, Nasal     Status: None   Collection Time: 05/31/21  2:36 PM   Specimen: Nasal Mucosa; Nasal Swab  Result Value Ref Range Status   MRSA by PCR Next Gen NOT DETECTED NOT DETECTED Final    Comment: (NOTE) The GeneXpert MRSA Assay (FDA approved for NASAL specimens only), is one component of a comprehensive MRSA colonization surveillance program. It is not intended to diagnose MRSA infection nor to guide or monitor treatment for MRSA infections. Test performance is not FDA approved in patients less than 81 years old. Performed at [facility], [address] [place], [place] [address]      Scheduled Meds:  acetylcysteine  4 mL Nebulization BID   ascorbic acid  500 mg Oral Daily   azithromycin  500 mg Oral Daily   Chlorhexidine Gluconate Cloth  6 each Topical Q0600   chlorpheniramine-HYDROcodone  5 mL Oral Q4H   enoxaparin (LOVENOX) injection  0.5 mg/kg Subcutaneous Q24H   fluticasone furoate-vilanterol  1 puff Inhalation Daily   furosemide  20 mg Oral Daily   gabapentin  300 mg Oral TID   insulin aspart  0-20 Units Subcutaneous TID AC & HS   insulin aspart  10 Units Subcutaneous TID WC   insulin glargine-yfgn  34 Units Subcutaneous BID   ipratropium-albuterol  3 mL Nebulization Q6H   neomycin-bacitracin-polymyxin    Topical BID   pantoprazole  40 mg Oral BID   [START ON 06/13/2021] predniSONE  40 mg Oral Q breakfast   Followed by   Derrill Memo ON 06/20/2021] predniSONE  30 mg Oral Q breakfast   Followed by   Derrill Memo ON 06/27/2021] predniSONE  20 mg Oral Q breakfast   Followed by   Derrill Memo ON 07/04/2021] predniSONE  10 mg Oral Q breakfast   predniSONE  50 mg Oral Q breakfast   rosuvastatin  5 mg Oral Q M,W,F   tamsulosin  0.4 mg Oral Daily   zinc sulfate  220 mg Oral Daily      LOS: 11 days   Laurey Arrow, MD   If 7PM-7AM, please contact  night-coverage per Amion 06/10/2021, 11:53 AM

## 2021-06-10 NOTE — Progress Notes (Signed)
NAME:  Dylan Pittman, MRN:  [id number], DOB:  26-Aug-1951, LOS: 27 ADMISSION DATE:  05/30/2021, CONSULTATION DATE:  05/30/21 REFERRING MD:  Dr. Creig Hines, CHIEF COMPLAINT:   Shortness of breath  History of Present Illness:  70 year old male presenting to [facility] ED from home on 05/30/2021 with complaints of progressive hypoxia at rest/with exertion and dyspnea with exertion.  Of note patient was recently hospitalized with COVID-19 pneumonia from 05/09/2021 to 05/13/2021.  He describes that since discharge he now wears continuous oxygen via nasal cannula.  He started at 2 L at rest and 4 L with ambulation, but reports he has had to increase to 5 L nasal cannula at rest which maintains his SPO2 greater than 90%.  He reports with any exertion his SPO2 drops into the 50s to 60%.  Earlier today he took a shower with his wife's assistance his oxygen dropped to 58% and it took 45 minutes for him to recover above 90% with his nasal cannula.  He does admit to feeling dizzy today correlating with his hypoxia, but denies any recent falls or injuries. He does have a CPAP machine to wear at night for his OSA, but admits that since discharge there was equipment difficulties and he has not been able to use it consistently.  He complains of persistent coughing spells with mostly clear phlegm, however earlier today he did notice some red streaks in his sputum.  He denies chest pain, nausea/vomiting/diarrhea/abdominal pain, fever/chills, or sore throat.      Pertinent  Medical History  Pulmonary fibrosis Hypertension Hyperlipidemia Type 2 diabetes Obstructive sleep apnea COVID-19 (September 2022) Prostate cancer status post seed ablation and Lupron injections BPH GERD Bronchitis Significant Hospital Events: Including procedures, antibiotic start and stop dates in addition to other pertinent events   05/30/2021: Admit to ICU on heated high flow nasal cannula at 75%/40 L in the secondary to suspected persistent pneumonia  from recent COVID-19 infection in the setting of chronic interstitial lung disease 06/01/21- Patient  is improved, reviewed medical plan with patient and wife 06/02/21-patient weaned to 50%/45L/min continue steroids , repeat blood work.  06/03/21- patient improved, met with wife and reviewed care plan today.  Legionella + on ab test but Urine negative suggestive of serotype 2 will order DFA and legionella culture. Increased zithromax to 500 daily 06/04/21- continue current care plan , patient is improved and weaned to 10L/min [place] 06/05/21- patient improved on supplemental O2, met with wife and reviewed care plan. Wil transfer to med surg with tele and start PT/OT 06/06/21- Patient is slightly improved, he is still coughing a lot and states the codiene cough suryp helps but not lasting but two hours so I have increased frequncely.  He still has high amount of phlegm on expectoration but inspissated unable to expectorate.  We have initiated mucomyst therapy today. Continue PT/OT.  Reviewed care plan with RT Beverlee Nims.  Met with friend and pastor of patient by name of Edmon Crape.  06/07/21- patient improved, weaned to 9L/min [place].  Worked well with mucomyst states he was able to expectorate copious phlegm.   06/08/21- cough is 80% less, patient ambulating 4 laps around hallways, FiO2 reduced to 7L/min.  Reduced rhonchi bilaterally on auscultation.  Will continue mucmoyst one more day and keep prednisone at 3m for one more day before weaning 06/09/21- patient improved he is on 6L/min.  Shares mucomyst is helping with expectoration of thickened phlegm.   06/10/21- patient on 6L/min [place]. He is slowly improving but  remains with resp distress and desaturates with any exertion.   Objective   Blood pressure 123/67, pulse 95, temperature 97.9 F (36.6 C), resp. rate 17, height _0  (1.727 m), weight 101.1 kg, SpO2 96 %.        Intake/Output Summary (Last 24 hours) at 06/10/2021 1244 Last data filed at 06/10/2021  0900 Gross per 24 hour  Intake 480 ml  Output 1600 ml  Net -1120 ml    Filed Weights   06/05/21 0500 06/06/21 0258 06/07/21 0500  Weight: 98.6 kg 100.6 kg 101.1 kg    Examination: General: Adult male, acutely ill, lying in bed, NAD HEENT: MM pink/moist, anicteric, atraumatic, neck supple Neuro: A&O x 4, able to follow commands, PERRL +3, MAE CV: s1s2 RRR, sinus tachycardia on monitor, no r/m/g Pulm: Regular, mildly labored/dyspneic at rest on HHFNC55% breath sounds coarse/diminished-BUL & diminished-BLL GI: soft, rounded, non tender, bs x 4 Skin: Limited exam-patient still in street clothes> no rashes/lesions noted Extremities: warm/dry, pulses + 2 R/P, trace edema noted BLE  Resolved Hospital Problem list     Assessment & Plan:  Acute Hypoxic Respiratory Failure       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          -Legionella Ab + , UG neg, DFA and legionella culture ordered - Continue HHFNC, wean FiO2 as tolerated > may need to transition to BIPAP overnight d/t OSA - Supplemental O2 to maintain SpO2 > 88% - Intermittent chest x-ray & ABG PRN - Ensure adequate pulmonary hygiene: IS Q 1 h x 10 while awake - F/u cultures, Respiratory 20 pathogen panel pending, f/u Strep pna antigen - trend PCT, daily CBC- monitor WBC/fever curve - PCT elevated at 1.15, will continue HCAP coverage: cefepime & Vancomycin - Breo-Ellipta inhaler BID, scheduled Duo-Nebs & bronchodilators PRN - Solu-medrol 40 mg BID>>>>once daily >>Prednisone 22m 10/9 - cxr in am reviewed with patient 06/06/21 - continue home Vitamin C & Zinc  Type 2 Diabetes Mellitus At Risk for Steroid Induced Hyperglycemia - Monitor CBG AC, HS - SSI moderate dosing, continue home long-acting coverage (solostar 40 units daily) >> Lantus 20 units BID - target range while in ICU: 140-180 - follow ICU hyper/hypo-glycemia protocol - Carb-modified diet in place  BPH - continue home flomax -  strict I & O's, monitor for urinary retention  Hyperlipidemia  Hypertension - continue home rosuvastatin - due to SIRS response to suspected Pneumonia with marginal-normal BP, will hold outpatient anti-hypertensives. Consider restarting: Losartan as patient stabilizes - PRN hydralazine if SBP > 160  Best Practice (right click and "Reselect all SmartList Selections" daily)  Diet/type: Regular consistency (see orders) DVT prophylaxis: LMWH GI prophylaxis: PPI Lines: N/A Foley:  N/A Code Status:  full code Last date of multidisciplinary goals of care discussion [05/30/2021]  IMAGING   Recent Labs  Lab 06/04/21 0441 06/05/21 0612 06/06/21 0449 06/09/21 0517  WBC 9.9 9.8 10.2 12.3*  NEUTROABS 5.9  --   --   --   HGB 13.2 13.2 13.2 12.6*  HCT 39.1 39.5 40.8 37.5*  MCV 92.4 93.4 93.2 94.9  PLT 358 319 328 223     Basic Metabolic Panel: Recent Labs  Lab 06/04/21 0441 06/05/21 0612 06/06/21 0449 06/09/21 0517  NA 138 139 136 135  K 3.8 3.9 4.1 4.0  CL 95* 93* 93* 92*  CO2 36* 37* 38* 36*  GLUCOSE 150* 125* 242* 186*  BUN _1 16  CREATININE 0.54* 0.45* 0.72 0.59*  CALCIUM 8.4* 8.3* 8.3* 8.2*  MG 2.2 2.2 2.3  --   PHOS 4.2 4.6 4.6  --     GFR: Estimated Creatinine Clearance: 101.9 mL/min (A) (by C-G formula based on SCr of 0.59 mg/dL (L)). Recent Labs  Lab 06/04/21 0441 06/05/21 0612 06/06/21 0449 06/09/21 0517  WBC 9.9 9.8 10.2 12.3*     Liver Function Tests: No results for input(s): AST, ALT, ALKPHOS, BILITOT, PROT, ALBUMIN in the last 168 hours.  No results for input(s): LIPASE, AMYLASE in the last 168 hours. No results for input(s): AMMONIA in the last 168 hours.  ABG No results found for: PHART, PCO2ART, PO2ART, HCO3, TCO2, ACIDBASEDEF, O2SAT   Coagulation Profile: No results for input(s): INR, PROTIME in the last 168 hours.  Cardiac Enzymes: No results for input(s): CKTOTAL, CKMB, CKMBINDEX, TROPONINI in the last 168 hours.  HbA1C: Hgb  A1c MFr Bld  Date/Time Value Ref Range Status  05/09/2021 06:18 AM 8.6 (H) 4.8 - 5.6 % Final    Comment:    (NOTE) Pre diabetes:          5.7%-6.4%  Diabetes:              >6.4%  Glycemic control for   <7.0% adults with diabetes     CBG: Recent Labs  Lab 06/09/21 1640 06/09/21 2009 06/10/21 0754 06/10/21 1200 06/10/21 1216  GLUCAP 352* 270* 144* 154* 157*     Review of Systems: Positives in BOLD   Gen: Denies fever, chills, weight change, fatigue, night sweats HEENT: Denies blurred vision, double vision, hearing loss, tinnitus, sinus congestion, rhinorrhea, sore throat, neck stiffness, dysphagia PULM: Denies shortness of breath, cough, sputum production, hemoptysis, wheezing CV: Denies chest pain, edema, orthopnea, paroxysmal nocturnal dyspnea, palpitations GI: Denies abdominal pain, nausea, vomiting, diarrhea, hematochezia, melena, constipation, change in bowel habits GU: Denies dysuria, hematuria, polyuria, oliguria, urethral discharge Endocrine: Denies hot or cold intolerance, polyuria, polyphagia or appetite change Derm: Denies rash, dry skin, scaling or peeling skin change Heme: Denies easy bruising, bleeding, bleeding gums Neuro: Denies headache, numbness, weakness, slurred speech, loss of memory or consciousness  Past Medical History:  He,  has a past medical history of Chronic airway obstruction (HCC), DDD (degenerative disc disease), lumbar, Dupuytren's disease, Dyslipidemia (04/07/2014), Esophageal reflux (04/20/2014), Hypertension (04/20/2014), Microalbuminuria (04/07/2014), Microalbuminuria, Obesity, unspecified (04/07/2014), Sleep apnea (04/20/2014), and 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   Surgical History:   Past Surgical History:  Procedure Laterality Date   BACK SURGERY  1992, 2005   CLOSED REDUCTION FINGER WITH PERCUTANEOUS PINNING Left 02/12/2020   Procedure: Closed reduction and pinning of left first metacarpal fracture  with possible open reduction;  Surgeon: Hessie Knows, MD;  Location: [facility] ORS;  Service: Orthopedics;  Laterality: Left;   COLONOSCOPY WITH PROPOFOL N/A 12/28/2017   Procedure: COLONOSCOPY WITH PROPOFOL;  Surgeon: Manya Silvas, MD;  Location: [facility] ENDOSCOPY;  Service: Endoscopy;  Laterality: N/A;   NASAL SEPTUM SURGERY     RADIOACTIVE SEED IMPLANT N/A 04/12/2021   Procedure: RADIOACTIVE SEED IMPLANT/BRACHYTHERAPY IMPLANT;  Surgeon: Abbie Sons, MD;  Location: [facility] ORS;  Service: Urology;  Laterality: N/A;  73 seeds implanted   VASECTOMY  2005     Social History:   reports that he has quit smoking. He has never used smokeless tobacco. He reports that he does not currently use alcohol. He reports that he does not currently use drugs.   Family History:  His family  history is negative for Prostate cancer and Kidney cancer.   Allergies No Known Allergies   Home Medications  Prior to Admission medications   Medication Sig Start Date End Date Taking? Authorizing Provider  albuterol (VENTOLIN HFA) 108 (90 Base) MCG/ACT inhaler Inhale 2 puffs into the lungs every 6 (six) hours as needed for wheezing or shortness of breath. 01/20/20  Yes [provider]  ascorbic acid (VITAMIN C) 1000 MG tablet Take 0.5 tablets (500 mg total) by mouth daily. 05/14/21  Yes Wieting, Richard, MD  cholecalciferol (VITAMIN D) 25 MCG (1000 UNIT) tablet Take 1,000 Units by mouth in the morning.   Yes [provider]  fluticasone-salmeterol (ADVAIR HFA) 115-21 MCG/ACT inhaler Inhale 2 puffs into the lungs 2 (two) times daily.   Yes [provider]  furosemide (LASIX) 20 MG tablet Take 20 mg by mouth daily. 05/26/21  Yes [provider]  gabapentin (NEURONTIN) 300 MG capsule Take 300 mg by mouth 3 (three) times daily. 05/26/21  Yes [provider]  insulin aspart (NOVOLOG) 100 UNIT/ML FlexPen Inject 8 Units into the skin 3 (three) times daily with meals. Okay to substitute  generic 05/13/21  Yes Wieting, Richard, MD  losartan (COZAAR) 50 MG tablet Take 50 mg by mouth in the morning. 10/07/17  Yes [provider]  OZEMPIC, 1 MG/DOSE, 4 MG/3ML SOPN Inject 1 mg into the skin every Sunday. 07/29/19  Yes [provider]  pantoprazole (PROTONIX) 40 MG tablet Take 40 mg by mouth 2 (two) times daily. 10/19/17  Yes [provider]  pioglitazone (ACTOS) 30 MG tablet Take 30 mg by mouth in the morning. 07/27/19  Yes [provider]  rosuvastatin (CRESTOR) 5 MG tablet Take 5 mg by mouth every Monday, Wednesday, and Friday. In the morning 04/19/18  Yes [provider]  tamsulosin (FLOMAX) 0.4 MG CAPS capsule Take 1 capsule (0.4 mg total) by mouth daily. 03/25/21  Yes Vaillancourt, Samantha, PA-C  TOUJEO MAX SOLOSTAR 300 UNIT/ML Solostar Pen Inject 50 Units into the skin in the morning. Patient taking differently: Inject 40 Units into the skin in the morning. 05/13/21  Yes Wieting, Richard, MD  XIGDUO XR 12-998 MG TB24 Take 2 tablets by mouth every morning. 01/31/21  Yes [provider]  zinc sulfate 220 (50 Zn) MG capsule Take 1 capsule (220 mg total) by mouth daily. 05/14/21  Yes Wieting, Richard, MD  aspirin EC 81 MG tablet Take 81 mg by mouth in the morning. Swallow whole. Patient not taking: Reported on 05/30/2021    [provider]  chlorpheniramine-HYDROcodone (TUSSIONEX) 10-8 MG/5ML SUER Take 5 mLs by mouth every 12 (twelve) hours as needed for cough. Patient not taking: No sig reported 05/13/21   Loletha Grayer, MD  feeding supplement, GLUCERNA SHAKE, (GLUCERNA SHAKE) LIQD Take 237 mLs by mouth 2 (two) times daily between meals. 05/13/21   Loletha Grayer, MD  fluticasone-salmeterol (ADVAIR HFA) 161-09 MCG/ACT inhaler Inhale 2 puffs into the lungs 2 (two) times daily. Rinse mouth out with water after use Patient not taking: No sig reported 05/13/21   Loletha Grayer, MD  Insulin Pen Needle 33G X 4 MM MISC 1 Dose by Does  not apply route 3 (three) times daily before meals. 05/13/21   Loletha Grayer, MD  predniSONE (DELTASONE) 10 MG tablet Three tabs po daily for five days Patient not taking: No sig reported 05/13/21   Loletha Grayer, MD       Ottie Glazier, M.D.  Pulmonary & Critical Care  Medicine

## 2021-06-10 NOTE — TOC Progression Note (Signed)
Transition of Care [facility]) - Progression Note    Patient Details  Name: Dylan Pittman MRN: [id number] Date of Birth: [birth year]  [facility] CM/SW [place], RN Phone Number: 06/10/2021, 12:59 PM  Clinical Narrative:   No changes, continuing to wean oxygen prior to discharge. TOC to follow    Expected Discharge Plan: [place] Barriers to Discharge: Continued Medical Work up  Expected Discharge Plan and Services Expected Discharge Plan: [place]   Discharge Planning Services: CM Consult Post Acute Care Choice: [place] arrangements for the past 2 months: Single Family Home                 DME Arranged:  (No DME recommended, has home o2 with adapt, no issues)                     Social Determinants of Health (SDOH) Interventions    Readmission Risk Interventions No flowsheet data found.

## 2021-06-10 NOTE — Progress Notes (Signed)
Physical Therapy Treatment Patient Details Name: Dylan Pittman MRN: [id number] DOB: [birth year] Today's Date: 06/10/2021   History of Present Illness Patient is a 70 year old male presenting to [facility] ED from home on 05/30/2021 with complaints of progressive hypoxia at rest/with exertion and dyspnea with exertion.  Of note patient was recently hospitalized with COVID-19 pneumonia from 05/09/2021 to 05/13/2021.    PT Comments    Pt received sitting Eob agreeable to PT services. Resting on 5L/min via [place] with SPO2 at 97%. Significant coughing fit took place prior to ambulation causing significant desat to ~77-78% requiring pt to switch to re-breather mask. Quick return to 90% after ~30 sec. Pt remains mod-I for transfers and supervision for ambulation tolerating x3 laps around unit with intermittent need for standing rest breaks due to desat to 86-87% requiring 9 L/min with ambulatory tasks. Able to return to > 90% after 30 sec of PLB. No LOB with ambulation nor significant SOB. Pt remains most limited with cough fits that cause significant desat to 70 percentiles. Pt able to perform 3x6 STS for LE strength at Eob returned to 5 L/min. Desat to 84% after first set then 78% on second set due to coughing fit, then remained > 90% on final set. Pt re-educated on importance of monitoring SPO2 during functional mobility and appropriate times for rest breaks/energy conservation. RN notified to assist in pt mobilization to maintain pt's strength/ current level of functional mobility. HH PT still remains appropriate as pt remains limited by his endurance as evidenced by desat numbers with functional mobility and LE therex.     Recommendations for follow up therapy are one component of a multi-disciplinary discharge planning process, led by the attending physician.  Recommendations may be updated based on patient status, additional functional criteria and insurance authorization.  Follow Up Recommendations  Home health  PT     Equipment Recommendations  None recommended by PT    Recommendations for Other Services       Precautions / Restrictions Restrictions Weight Bearing Restrictions: No     Mobility  Bed Mobility Overal bed mobility: Modified Independent               Patient Response: Cooperative  Transfers Overall transfer level: Modified independent Equipment used: None                Ambulation/Gait Ambulation/Gait assistance: Supervision Gait Distance (Feet): 480 Feet Assistive device: None Gait Pattern/deviations: Step-through pattern;Decreased stride length     General Gait Details: Able to titrate down to 9 L/min with amb. Desat to 86% with standing rest to recover to 90% with PLB. Returned to 5 L/min in room. HR remained 110-118 BPM   Stairs             Wheelchair Mobility    Modified Rankin (Stroke Patients Only)       Balance Overall balance assessment: Modified Independent Sitting-balance support: Feet supported Sitting balance-Leahy Scale: Normal     Standing balance support: No upper extremity supported;During functional activity Standing balance-Leahy Scale: Good                              Cognition Arousal/Alertness: Awake/alert Behavior During Therapy: WFL for tasks assessed/performed Overall Cognitive Status: Within Functional Limits for tasks assessed  Exercises Other Exercises Other Exercises: 3x6, STS    General Comments General comments (skin integrity, edema, etc.): Resting at 5L/min. Titrated to 9L/min with ambulation.      Pertinent Vitals/Pain Pain Assessment: No/denies pain    Home Living                      Prior Function            PT Goals (current goals can now be found in the care plan section) Acute Rehab PT Goals Patient Stated Goal: to go home PT Goal Formulation: With patient Time For Goal Achievement:  06/20/21 Potential to Achieve Goals: Good Progress towards PT goals: Progressing toward goals    Frequency    Min 2X/week      PT Plan Current plan remains appropriate    Co-evaluation              AM-PAC PT "6 Clicks" Mobility   Outcome Measure  Help needed turning from your back to your side while in a flat bed without using bedrails?: None Help needed moving from lying on your back to sitting on the side of a flat bed without using bedrails?: None Help needed moving to and from a bed to a chair (including a wheelchair)?: None Help needed standing up from a chair using your arms (e.g., wheelchair or bedside chair)?: None Help needed to walk in hospital room?: None Help needed climbing 3-5 steps with a railing? : A Little 6 Click Score: 23    End of Session Equipment Utilized During Treatment: Oxygen Activity Tolerance: Patient tolerated treatment well Patient left: in bed;with family/visitor present Nurse Communication: Mobility status PT Visit Diagnosis: Difficulty in walking, not elsewhere classified (R26.2)     Time: 4818-5631 PT Time Calculation (min) (ACUTE ONLY): 30 min  Charges:  $Therapeutic Exercise: 23-37 mins                    Salem Caster. Fairly IV, PT, DPT Physical Therapist- [facility]  06/10/2021, 3:48 PM

## 2021-06-11 ENCOUNTER — Inpatient Hospital Stay: Payer: BC Managed Care – PPO

## 2021-06-11 DIAGNOSIS — J9621 Acute and chronic respiratory failure with hypoxia: Secondary | ICD-10-CM | POA: Diagnosis not present

## 2021-06-11 LAB — GLUCOSE, CAPILLARY
Glucose-Capillary: 201 mg/dL — ABNORMAL HIGH (ref 70–99)
Glucose-Capillary: 160 mg/dL — ABNORMAL HIGH (ref 70–99)
Glucose-Capillary: 265 mg/dL — ABNORMAL HIGH (ref 70–99)
Glucose-Capillary: 248 mg/dL — ABNORMAL HIGH (ref 70–99)

## 2021-06-11 MED ORDER — DEXAMETHASONE 4 MG PO TABS
8.0000 mg | ORAL_TABLET | Freq: Two times a day (BID) | ORAL | Status: DC
  Administered 2021-06-11 – 2021-06-13 (×5): 8 mg via ORAL
  Filled 2021-06-11 (×5): qty 2

## 2021-06-11 MED ORDER — INSULIN GLARGINE-YFGN 100 UNIT/ML ~~LOC~~ SOLN
38.0000 [IU] | Freq: Two times a day (BID) | SUBCUTANEOUS | Status: DC
  Administered 2021-06-11 – 2021-06-12 (×2): 38 [IU] via SUBCUTANEOUS
  Filled 2021-06-11 (×3): qty 0.38

## 2021-06-11 MED ORDER — PANTOPRAZOLE SODIUM 40 MG PO TBEC
40.0000 mg | DELAYED_RELEASE_TABLET | Freq: Every day | ORAL | Status: DC
  Administered 2021-06-12: 08:00:00 40 mg via ORAL
  Filled 2021-06-11: qty 1

## 2021-06-11 MED ORDER — SALINE SPRAY 0.65 % NA SOLN
1.0000 | NASAL | Status: DC | PRN
  Administered 2021-06-12: 01:00:00 1 via NASAL
  Filled 2021-06-11: qty 44

## 2021-06-11 MED ORDER — SULFAMETHOXAZOLE-TRIMETHOPRIM 800-160 MG PO TABS
1.0000 | ORAL_TABLET | ORAL | Status: DC
  Administered 2021-06-13 – 2021-06-17 (×3): 1 via ORAL
  Filled 2021-06-11 (×4): qty 1

## 2021-06-11 MED ORDER — POLYVINYL ALCOHOL 1.4 % OP SOLN
1.0000 [drp] | OPHTHALMIC | Status: DC | PRN
  Filled 2021-06-11: qty 15

## 2021-06-11 MED ORDER — ALBUTEROL SULFATE (2.5 MG/3ML) 0.083% IN NEBU: 2.5000 mg | INHALATION_SOLUTION | RESPIRATORY_TRACT | Status: DC | PRN

## 2021-06-11 NOTE — Progress Notes (Signed)
Dylan Pittman  PJA:[id number] DOB: 1951-05-19 DOA: 05/30/2021 PCP: Sofie Hartigan, MD    Brief Narrative:  70 year old with a history of pulmonary fibrosis, HTN, HLD, DM2, obstructive sleep apnea, prostate cancer status post seed ablation and Lupron, BPH, and GERD who was admitted after presenting to the [facility] ED from home 10/3 with progressive hypoxia at rest and severe dyspnea on exertion.  The patient had been hospitalized with COVID pneumonia 9/12 >05/13/2021.  He stated that his dyspnea only worsened after being discharged home with increasing oxygen support requirements each day.  He noted his saturations dropping into the 50-60% range with exertion.  Significant Events:  10/3 admit via ED - required [place] 10/6 weaning of [place]  10/8 weaned to nasal cannula at 10 L [place]  10/12 weaned to 7L, symptomatically improving  Consultants:  PCCM  Code Status: FULL CODE  Antimicrobials:  Azithromycin 10/5 >  Cefepime 10/3 > 10/4 Vancomycin 10/3 > 10/5  DVT prophylaxis: Lovenox  Subjective: No new events reported overnight.  Remains dyspneic with exertion, this is improving. Otherwise feels well  Assessment & Plan:  Acute on chronic hypoxic respiratory failure - Post CoViD ILD / Pulm Fibrosis - ILD w/ acute exacerbation - possible Legionella  Care per Pulmonary -weaning oxygen as able - pulm weaning steroids. Slowly weaning o2, down to 5 L today. Is s/p 10 days azith for possible legionella. Continuing pulm toilet, inhalers.   DM2 CBG mild elevations fasting, pre-prandial wnl. Increase semglee from 34 to 38 bid, cont mealtime 10 w/ meals, continue ssi  Anal fissure v/s small hemorrhoid - scant BRBPR resolved Keep lubricated w/ vaseline or neosporin - trial of anusol suppository - keep stools loose - monitor   BPH Continue usual home medical therapy  HLD Continue usual home medical therapy  HTN Blood pressure currently well controlled  Family Communication: wife updated  telephonically 10/14  Remains inpatient appropriate because:Inpatient level of care appropriate due to severity of illness  Dispo: The patient is from: Home              Anticipated d/c is to: Home w/ home health PT              Patient currently is not medically stable to d/c.   Difficult to place patient No  Objective: Blood pressure 125/64, pulse 99, temperature 98 F (36.7 C), resp. rate 20, height 5\' 8"  (1.727 m), weight 101.1 kg, SpO2 (!) 88 %.  Intake/Output Summary (Last 24 hours) at 06/11/2021 1324 Last data filed at 06/11/2021 1310 Gross per 24 hour  Intake 360 ml  Output 1925 ml  Net -1565 ml   Filed Weights   06/05/21 0500 06/06/21 0258 06/07/21 0500  Weight: 98.6 kg 100.6 kg 101.1 kg    Examination: General: No acute respiratory distress but dyspnic with minimal movement Lungs: no wheezing, faint rales Cardiovascular: Regular rate and rhythm without murmur  Abdomen: Nontender, nondistended, soft, bowel sounds positive, no rebound Extremities: Trace bilateral lower extremity edema  CBC: Recent Labs  Lab 06/05/21 0612 06/06/21 0449 06/09/21 0517  WBC 9.8 10.2 12.3*  HGB 13.2 13.2 12.6*  HCT 39.5 40.8 37.5*  MCV 93.4 93.2 94.9  PLT 319 328 341   Basic Metabolic Panel: Recent Labs  Lab 06/05/21 0612 06/06/21 0449 06/09/21 0517  NA 139 136 135  K 3.9 4.1 4.0  CL 93* 93* 92*  CO2 37* 38* 36*  GLUCOSE 125* 242* 186*  BUN 18 18 16   CREATININE  0.45* 0.72 0.59*  CALCIUM 8.3* 8.3* 8.2*  MG 2.2 2.3  --   PHOS 4.6 4.6  --    GFR: Estimated Creatinine Clearance: 101.9 mL/min (A) (by C-G formula based on SCr of 0.59 mg/dL (L)).  Liver Function Tests: No results for input(s): AST, ALT, ALKPHOS, BILITOT, PROT, ALBUMIN in the last 168 hours.   HbA1C: Hgb A1c MFr Bld  Date/Time Value Ref Range Status  05/09/2021 06:18 AM 8.6 (H) 4.8 - 5.6 % Final    Comment:    (NOTE) Pre diabetes:          5.7%-6.4%  Diabetes:              >6.4%  Glycemic  control for   <7.0% adults with diabetes     CBG: Recent Labs  Lab 06/10/21 1216 06/10/21 1650 06/10/21 2032 06/11/21 0810 06/11/21 1208  GLUCAP 157* 316* 252* 201* 160*    Recent Results (from the past 240 hour(s))  Legionella, DFA (w/out culture)     Status: None   Collection Time: 06/04/21  4:53 AM   Specimen: Blood  Result Value Ref Range Status   Source, Legionella Cul SPUTUM  Final    Comment: Performed at [facility], [address] [place], [place] [address]   Legionella Pneumophila DFA Negative Negative Final    Comment: (NOTE) Performed At: [facility] [place], [place] [id number] Rush Farmer MD JK:[id number]      Scheduled Meds:  Chlorhexidine Gluconate Cloth  6 each Topical Q0600   chlorpheniramine-HYDROcodone  5 mL Oral Q4H   dexamethasone  8 mg Oral Q12H   enoxaparin (LOVENOX) injection  0.5 mg/kg Subcutaneous Q24H   fluticasone furoate-vilanterol  1 puff Inhalation Daily   furosemide  20 mg Oral Daily   insulin aspart  0-20 Units Subcutaneous TID AC & HS   insulin aspart  10 Units Subcutaneous TID WC   insulin glargine-yfgn  34 Units Subcutaneous BID   ipratropium-albuterol  3 mL Nebulization Q6H   neomycin-bacitracin-polymyxin   Topical BID   pantoprazole  40 mg Oral BID   rosuvastatin  5 mg Oral Q M,W,F   [START ON 06/13/2021] sulfamethoxazole-trimethoprim  1 tablet Oral Once per day on Mon Wed Fri   tamsulosin  0.4 mg Oral Daily   zinc sulfate  220 mg Oral Daily      LOS: 12 days   Laurey Arrow, MD   If 7PM-7AM, please contact night-coverage per Amion 06/11/2021, 1:24 PM

## 2021-06-11 NOTE — Progress Notes (Signed)
Notified Dr Si Raider of O2 sats fluctuates between 84%-92% on 5L O2 per [place] at rest.  Patient uses the non-rebreather intermittently.  Per MD to keep O2 sats at 88% or higher at rest.  Per MD may d/c continuous pulse ox.

## 2021-06-11 NOTE — Progress Notes (Signed)
NAME:  Dylan Pittman, MRN:  [id number], DOB:  11-Aug-1951, LOS: 78 ADMISSION DATE:  05/30/2021, CONSULTATION DATE:  05/30/21 REFERRING MD:  Dr. Creig Hines, CHIEF COMPLAINT:   Shortness of breath  History of Present Illness:  70 year old male presenting to [facility] ED from home on 05/30/2021 with complaints of progressive hypoxia at rest/with exertion and dyspnea with exertion.  Of note patient was recently hospitalized with COVID-19 pneumonia from 05/09/2021 to 05/13/2021.  He describes that since discharge he now wears continuous oxygen via nasal cannula.  He started at 2 L at rest and 4 L with ambulation, but reports he has had to increase to 5 L nasal cannula at rest which maintains his SPO2 greater than 90%.  He reports with any exertion his SPO2 drops into the 50s to 60%.  Earlier today he took a shower with his wife's assistance his oxygen dropped to 58% and it took 45 minutes for him to recover above 90% with his nasal cannula.  He does admit to feeling dizzy today correlating with his hypoxia, but denies any recent falls or injuries. He does have a CPAP machine to wear at night for his OSA, but admits that since discharge there was equipment difficulties and he has not been able to use it consistently.  He complains of persistent coughing spells with mostly clear phlegm, however earlier today he did notice some red streaks in his sputum.  He denies chest pain, nausea/vomiting/diarrhea/abdominal pain, fever/chills, or sore throat.      Pertinent  Medical History  Pulmonary fibrosis Hypertension Hyperlipidemia Type 2 diabetes Obstructive sleep apnea COVID-19 (September 2022) Prostate cancer status post seed ablation and Lupron injections BPH GERD Bronchitis Significant Hospital Events: Including procedures, antibiotic start and stop dates in addition to other pertinent events   05/30/2021: Admit to ICU on heated high flow nasal cannula at 75%/40 L in the secondary to suspected persistent pneumonia  from recent COVID-19 infection in the setting of chronic interstitial lung disease 06/01/21- Patient  is improved, reviewed medical plan with patient and wife 06/02/21-patient weaned to 50%/45L/min continue steroids , repeat blood work.  06/03/21- patient improved, met with wife and reviewed care plan today.  Legionella + on ab test but Urine negative suggestive of serotype 2 will order DFA and legionella culture. Increased zithromax to 500 daily 06/04/21- continue current care plan , patient is improved and weaned to 10L/min [place] 06/05/21- patient improved on supplemental O2, met with wife and reviewed care plan. Wil transfer to med surg with tele and start PT/OT 06/06/21- Patient is slightly improved, he is still coughing a lot and states the codiene cough suryp helps but not lasting but two hours so I have increased frequncely.  He still has high amount of phlegm on expectoration but inspissated unable to expectorate.  We have initiated mucomyst therapy today. Continue PT/OT.  Reviewed care plan with RT Beverlee Nims.  Met with friend and pastor of patient by name of Edmon Crape.  06/07/21- patient improved, weaned to 9L/min [place].  Worked well with mucomyst states he was able to expectorate copious phlegm.   06/08/21- cough is 80% less, patient ambulating 4 laps around hallways, FiO2 reduced to 7L/min.  Reduced rhonchi bilaterally on auscultation.  Will continue mucmoyst one more day and keep prednisone at 21m for one more day before weaning 06/09/21- patient improved he is on 6L/min.  Shares mucomyst is helping with expectoration of thickened phlegm.   06/10/21- patient on 6L/min [place]. He is slowly improving but  remains with resp distress and desaturates with any exertion.    06/11/21- patient feels slightly worse. He is normoxic on 5L/min.  There is CXR to be repeated today.  I will change steroids to Dexamethrasone bid PO from prednisone. Continue PT/OT, cotninue wean O2 with goal 88 at rest   Objective    Blood pressure 128/61, pulse 97, temperature 98 F (36.7 C), resp. rate 18, height '5\' 8"'  (1.727 m), weight 101.1 kg, SpO2 92 %.        Intake/Output Summary (Last 24 hours) at 06/11/2021 1001 Last data filed at 06/11/2021 5638 Gross per 24 hour  Intake 360 ml  Output 1200 ml  Net -840 ml    Filed Weights   06/05/21 0500 06/06/21 0258 06/07/21 0500  Weight: 98.6 kg 100.6 kg 101.1 kg    Examination: General: Adult male, acutely ill, lying in bed, NAD HEENT: MM pink/moist, anicteric, atraumatic, neck supple Neuro: A&O x 4, able to follow commands, PERRL +3, MAE CV: s1s2 RRR, sinus tachycardia on monitor, no r/m/g Pulm: Regular, mildly labored/dyspneic at rest on HHFNC55% breath sounds coarse/diminished-BUL & diminished-BLL GI: soft, rounded, non tender, bs x 4 Skin: Limited exam-patient still in street clothes> no rashes/lesions noted Extremities: warm/dry, pulses + 2 R/P, trace edema noted BLE  Resolved Hospital Problem list     Assessment & Plan:  Acute Hypoxic Respiratory Failure       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  - continue home Vitamin C & Zinc  Type 2 Diabetes Mellitus At Risk for Steroid Induced Hyperglycemia - Monitor CBG AC, HS - SSI moderate dosing, continue home long-acting coverage (solostar 40 units daily) >> Lantus 20 units BID - target range while in ICU: 140-180 - follow ICU hyper/hypo-glycemia protocol - Carb-modified diet in place  BPH - continue home flomax - strict I & O's, monitor for urinary retention  Hyperlipidemia  Hypertension - continue home  rosuvastatin - due to SIRS response to suspected Pneumonia with marginal-normal BP, will hold outpatient anti-hypertensives. Consider restarting: Losartan as patient stabilizes - PRN hydralazine if SBP > 160  Best Practice (right click and "Reselect all SmartList Selections" daily)  Diet/type: Regular consistency (see orders) DVT prophylaxis: LMWH GI prophylaxis: PPI Lines: N/A Foley:  N/A Code Status:  full code Last date of multidisciplinary goals of care discussion [05/30/2021]  IMAGING   Recent Labs  Lab 06/05/21 0612 06/06/21 0449 06/09/21 0517  WBC 9.8 10.2 12.3*  HGB 13.2 13.2 12.6*  HCT 39.5 40.8 37.5*  MCV 93.4 93.2 94.9  PLT 319 328 223     Basic Metabolic Panel: Recent Labs  Lab 06/05/21 0612 06/06/21 0449 06/09/21 0517  NA 139 136 135  K 3.9 4.1 4.0  CL 93* 93* 92*  CO2 37* 38* 36*  GLUCOSE 125* 242* 186*  BUN '18 18 16  ' CREATININE 0.45* 0.72 0.59*  CALCIUM 8.3* 8.3* 8.2*  MG 2.2 2.3  --   PHOS 4.6 4.6  --     GFR: Estimated Creatinine Clearance: 101.9 mL/min (A) (by  C-G formula based on SCr of 0.59 mg/dL (L)). Recent Labs  Lab 06/05/21 0612 06/06/21 0449 06/09/21 0517  WBC 9.8 10.2 12.3*     Liver Function Tests: No results for input(s): AST, ALT, ALKPHOS, BILITOT, PROT, ALBUMIN in the last 168 hours.  No results for input(s): LIPASE, AMYLASE in the last 168 hours. No results for input(s): AMMONIA in the last 168 hours.  ABG No results found for: PHART, PCO2ART, PO2ART, HCO3, TCO2, ACIDBASEDEF, O2SAT   Coagulation Profile: No results for input(s): INR, PROTIME in the last 168 hours.  Cardiac Enzymes: No results for input(s): CKTOTAL, CKMB, CKMBINDEX, TROPONINI in the last 168 hours.  HbA1C: Hgb A1c MFr Bld  Date/Time Value Ref Range Status  05/09/2021 06:18 AM 8.6 (H) 4.8 - 5.6 % Final    Comment:    (NOTE) Pre diabetes:          5.7%-6.4%  Diabetes:              >6.4%  Glycemic control for   <7.0% adults with diabetes      CBG: Recent Labs  Lab 06/10/21 1200 06/10/21 1216 06/10/21 1650 06/10/21 2032 06/11/21 0810  GLUCAP 154* 157* 316* 252* 201*     Review of Systems: Positives in BOLD   Gen: Denies fever, chills, weight change, fatigue, night sweats HEENT: Denies blurred vision, double vision, hearing loss, tinnitus, sinus congestion, rhinorrhea, sore throat, neck stiffness, dysphagia PULM: Denies shortness of breath, cough, sputum production, hemoptysis, wheezing CV: Denies chest pain, edema, orthopnea, paroxysmal nocturnal dyspnea, palpitations GI: Denies abdominal pain, nausea, vomiting, diarrhea, hematochezia, melena, constipation, change in bowel habits GU: Denies dysuria, hematuria, polyuria, oliguria, urethral discharge Endocrine: Denies hot or cold intolerance, polyuria, polyphagia or appetite change Derm: Denies rash, dry skin, scaling or peeling skin change Heme: Denies easy bruising, bleeding, bleeding gums Neuro: Denies headache, numbness, weakness, slurred speech, loss of memory or consciousness  Past Medical History:  He,  has a past medical history of Chronic airway obstruction (HCC), DDD (degenerative disc disease), lumbar, Dupuytren's disease, Dyslipidemia (04/07/2014), Esophageal reflux (04/20/2014), Hypertension (04/20/2014), Microalbuminuria (04/07/2014), Microalbuminuria, Obesity, unspecified (04/07/2014), Sleep apnea (04/20/2014), and 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   Surgical History:   Past Surgical History:  Procedure Laterality Date   BACK SURGERY  1992, 2005   CLOSED REDUCTION FINGER WITH PERCUTANEOUS PINNING Left 02/12/2020   Procedure: Closed reduction and pinning of left first metacarpal fracture with possible open reduction;  Surgeon: Hessie Knows, MD;  Location: [facility] ORS;  Service: Orthopedics;  Laterality: Left;   COLONOSCOPY WITH PROPOFOL N/A 12/28/2017   Procedure: COLONOSCOPY WITH PROPOFOL;  Surgeon: Manya Silvas, MD;   Location: [facility] ENDOSCOPY;  Service: Endoscopy;  Laterality: N/A;   NASAL SEPTUM SURGERY     RADIOACTIVE SEED IMPLANT N/A 04/12/2021   Procedure: RADIOACTIVE SEED IMPLANT/BRACHYTHERAPY IMPLANT;  Surgeon: Abbie Sons, MD;  Location: [facility] ORS;  Service: Urology;  Laterality: N/A;  73 seeds implanted   VASECTOMY  2005     Social History:   reports that he has quit smoking. He has never used smokeless tobacco. He reports that he does not currently use alcohol. He reports that he does not currently use drugs.   Family History:  His family history is negative for Prostate cancer and Kidney cancer.   Allergies No Known Allergies   Home Medications  Prior to Admission medications   Medication Sig Start Date End Date Taking? Authorizing Provider  albuterol (VENTOLIN HFA) 108 (90  Base) MCG/ACT inhaler Inhale 2 puffs into the lungs every 6 (six) hours as needed for wheezing or shortness of breath. 01/20/20  Yes [provider]  ascorbic acid (VITAMIN C) 1000 MG tablet Take 0.5 tablets (500 mg total) by mouth daily. 05/14/21  Yes Wieting, Richard, MD  cholecalciferol (VITAMIN D) 25 MCG (1000 UNIT) tablet Take 1,000 Units by mouth in the morning.   Yes [provider]  fluticasone-salmeterol (ADVAIR HFA) 115-21 MCG/ACT inhaler Inhale 2 puffs into the lungs 2 (two) times daily.   Yes [provider]  furosemide (LASIX) 20 MG tablet Take 20 mg by mouth daily. 05/26/21  Yes [provider]  gabapentin (NEURONTIN) 300 MG capsule Take 300 mg by mouth 3 (three) times daily. 05/26/21  Yes [provider]  insulin aspart (NOVOLOG) 100 UNIT/ML FlexPen Inject 8 Units into the skin 3 (three) times daily with meals. Okay to substitute generic 05/13/21  Yes Wieting, Richard, MD  losartan (COZAAR) 50 MG tablet Take 50 mg by mouth in the morning. 10/07/17  Yes [provider]  OZEMPIC, 1 MG/DOSE, 4 MG/3ML SOPN Inject 1 mg into the skin every Sunday. 07/29/19  Yes  [provider]  pantoprazole (PROTONIX) 40 MG tablet Take 40 mg by mouth 2 (two) times daily. 10/19/17  Yes [provider]  pioglitazone (ACTOS) 30 MG tablet Take 30 mg by mouth in the morning. 07/27/19  Yes [provider]  rosuvastatin (CRESTOR) 5 MG tablet Take 5 mg by mouth every Monday, Wednesday, and Friday. In the morning 04/19/18  Yes [provider]  tamsulosin (FLOMAX) 0.4 MG CAPS capsule Take 1 capsule (0.4 mg total) by mouth daily. 03/25/21  Yes Vaillancourt, Samantha, PA-C  TOUJEO MAX SOLOSTAR 300 UNIT/ML Solostar Pen Inject 50 Units into the skin in the morning. Patient taking differently: Inject 40 Units into the skin in the morning. 05/13/21  Yes Wieting, Richard, MD  XIGDUO XR 12-998 MG TB24 Take 2 tablets by mouth every morning. 01/31/21  Yes [provider]  zinc sulfate 220 (50 Zn) MG capsule Take 1 capsule (220 mg total) by mouth daily. 05/14/21  Yes Wieting, Richard, MD  aspirin EC 81 MG tablet Take 81 mg by mouth in the morning. Swallow whole. Patient not taking: Reported on 05/30/2021    [provider]  chlorpheniramine-HYDROcodone (TUSSIONEX) 10-8 MG/5ML SUER Take 5 mLs by mouth every 12 (twelve) hours as needed for cough. Patient not taking: No sig reported 05/13/21   Loletha Grayer, MD  feeding supplement, GLUCERNA SHAKE, (GLUCERNA SHAKE) LIQD Take 237 mLs by mouth 2 (two) times daily between meals. 05/13/21   Loletha Grayer, MD  fluticasone-salmeterol (ADVAIR HFA) 381-82 MCG/ACT inhaler Inhale 2 puffs into the lungs 2 (two) times daily. Rinse mouth out with water after use Patient not taking: No sig reported 05/13/21   Loletha Grayer, MD  Insulin Pen Needle 33G X 4 MM MISC 1 Dose by Does not apply route 3 (three) times daily before meals. 05/13/21   Loletha Grayer, MD  predniSONE (DELTASONE) 10 MG tablet Three tabs po daily for five days Patient not taking: No sig reported 05/13/21   Loletha Grayer, MD       Ottie Glazier, M.D.  Pulmonary & Critical Care Medicine

## 2021-06-12 DIAGNOSIS — J9621 Acute and chronic respiratory failure with hypoxia: Secondary | ICD-10-CM | POA: Diagnosis not present

## 2021-06-12 LAB — CBC
HCT: 38.5 % — ABNORMAL LOW (ref 39.0–52.0)
Hemoglobin: 12.6 g/dL — ABNORMAL LOW (ref 13.0–17.0)
MCH: 30.7 pg (ref 26.0–34.0)
MCHC: 32.7 g/dL (ref 30.0–36.0)
MCV: 93.7 fL (ref 80.0–100.0)
Platelets: 215 10*3/uL (ref 150–400)
RBC: 4.11 MIL/uL — ABNORMAL LOW (ref 4.22–5.81)
RDW: 12.8 % (ref 11.5–15.5)
WBC: 10.4 10*3/uL (ref 4.0–10.5)
nRBC: 0 % (ref 0.0–0.2)

## 2021-06-12 LAB — GLUCOSE, CAPILLARY
Glucose-Capillary: 285 mg/dL — ABNORMAL HIGH (ref 70–99)
Glucose-Capillary: 233 mg/dL — ABNORMAL HIGH (ref 70–99)
Glucose-Capillary: 328 mg/dL — ABNORMAL HIGH (ref 70–99)
Glucose-Capillary: 171 mg/dL — ABNORMAL HIGH (ref 70–99)

## 2021-06-12 LAB — BASIC METABOLIC PANEL
Anion gap: 7 (ref 5–15)
BUN: 18 mg/dL (ref 8–23)
CO2: 35 mmol/L — ABNORMAL HIGH (ref 22–32)
Calcium: 8.9 mg/dL (ref 8.9–10.3)
Chloride: 98 mmol/L (ref 98–111)
Creatinine, Ser: 0.49 mg/dL — ABNORMAL LOW (ref 0.61–1.24)
GFR, Estimated: >60 mL/min (ref >60)
Glucose, Bld: 193 mg/dL — ABNORMAL HIGH (ref 70–99)
Potassium: 4.4 mmol/L (ref 3.5–5.1)
Sodium: 140 mmol/L (ref 135–145)

## 2021-06-12 MED ORDER — LOSARTAN POTASSIUM 50 MG PO TABS
50.0000 mg | ORAL_TABLET | Freq: Every morning | ORAL | Status: DC
  Administered 2021-06-13: 50 mg via ORAL
  Filled 2021-06-12: qty 1

## 2021-06-12 MED ORDER — INSULIN GLARGINE-YFGN 100 UNIT/ML ~~LOC~~ SOLN
42.0000 [IU] | Freq: Two times a day (BID) | SUBCUTANEOUS | Status: DC
  Administered 2021-06-12 – 2021-06-13 (×2): 42 [IU] via SUBCUTANEOUS
  Filled 2021-06-12 (×3): qty 0.42

## 2021-06-12 NOTE — Progress Notes (Signed)
NAME:  Dylan Pittman, MRN:  [id number], DOB:  [birth year], LOS: 57 ADMISSION DATE:  05/30/2021, CONSULTATION DATE:  05/30/21 REFERRING MD:  Dr. Creig Hines, CHIEF COMPLAINT:   Shortness of breath  History of Present Illness:  70 year old male presenting to [facility] ED from home on 05/30/2021 with complaints of progressive hypoxia at rest/with exertion and dyspnea with exertion.  Of note patient was recently hospitalized with COVID-19 pneumonia from 05/09/2021 to 05/13/2021.  He describes that since discharge he now wears continuous oxygen via nasal cannula.  He started at 2 L at rest and 4 L with ambulation, but reports he has had to increase to 5 L nasal cannula at rest which maintains his SPO2 greater than 90%.  He reports with any exertion his SPO2 drops into the 50s to 60%.  Earlier today he took a shower with his wife's assistance his oxygen dropped to 58% and it took 45 minutes for him to recover above 90% with his nasal cannula.  He does admit to feeling dizzy today correlating with his hypoxia, but denies any recent falls or injuries. He does have a CPAP machine to wear at night for his OSA, but admits that since discharge there was equipment difficulties and he has not been able to use it consistently.  He complains of persistent coughing spells with mostly clear phlegm, however earlier today he did notice some red streaks in his sputum.  He denies chest pain, nausea/vomiting/diarrhea/abdominal pain, fever/chills, or sore throat.      Pertinent  Medical History  Pulmonary fibrosis Hypertension Hyperlipidemia Type 2 diabetes Obstructive sleep apnea COVID-19 (September 2022) Prostate cancer status post seed ablation and Lupron injections BPH GERD Bronchitis Significant Hospital Events: Including procedures, antibiotic start and stop dates in addition to other pertinent events   05/30/2021: Admit to ICU on heated high flow nasal cannula at 75%/40 L in the secondary to suspected persistent pneumonia  from recent COVID-19 infection in the setting of chronic interstitial lung disease 06/01/21- Patient  is improved, reviewed medical plan with patient and wife 06/02/21-patient weaned to 50%/45L/min continue steroids , repeat blood work.  06/03/21- patient improved, met with wife and reviewed care plan today.  Legionella + on ab test but Urine negative suggestive of serotype 2 will order DFA and legionella culture. Increased zithromax to 500 daily 06/04/21- continue current care plan , patient is improved and weaned to 10L/min [place] 06/05/21- patient improved on supplemental O2, met with wife and reviewed care plan. Wil transfer to med surg with tele and start PT/OT 06/06/21- Patient is slightly improved, he is still coughing a lot and states the codiene cough suryp helps but not lasting but two hours so I have increased frequncely.  He still has high amount of phlegm on expectoration but inspissated unable to expectorate.  We have initiated mucomyst therapy today. Continue PT/OT.  Reviewed care plan with RT Beverlee Nims.  Met with friend and pastor of patient by name of Edmon Crape.  06/07/21- patient improved, weaned to 9L/min [place].  Worked well with mucomyst states he was able to expectorate copious phlegm.   06/08/21- cough is 80% less, patient ambulating 4 laps around hallways, FiO2 reduced to 7L/min.  Reduced rhonchi bilaterally on auscultation.  Will continue mucmoyst one more day and keep prednisone at 94m for one more day before weaning 06/09/21- patient improved he is on 6L/min.  Shares mucomyst is helping with expectoration of thickened phlegm.   06/10/21- patient on 6L/min [place]. He is slowly improving but  remains with resp distress and desaturates with any exertion.    06/11/21- patient feels slightly worse. He is normoxic on 5L/min.  There is CXR to be repeated today.  I will change steroids to Dexamethrasone bid PO from prednisone. Continue PT/OT, cotninue wean O2 with goal 88 at rest   06/12/21- Patient  is on 5L/min still with quick desaturation on any activity.  He is very labored with respiratory status. He may need LTAC for weaninf from O2 demands.  Reviewed with Dr Si Raider.   Objective   Blood pressure (!) 153/84, pulse 96, temperature 97.8 F (36.6 C), temperature source Oral, resp. rate 18, height _0  (1.727 m), weight 101.1 kg, SpO2 92 %.        Intake/Output Summary (Last 24 hours) at 06/12/2021 1247 Last data filed at 06/12/2021 1155 Gross per 24 hour  Intake 480 ml  Output 5050 ml  Net -4570 ml    Filed Weights   06/05/21 0500 06/06/21 0258 06/07/21 0500  Weight: 98.6 kg 100.6 kg 101.1 kg    Examination: General: Adult male, acutely ill, lying in bed, NAD HEENT: MM pink/moist, anicteric, atraumatic, neck supple Neuro: A&O x 4, able to follow commands, PERRL +3, MAE CV: s1s2 RRR, sinus tachycardia on monitor, no r/m/g Pulm: Regular, mildly labored/dyspneic at rest on HHFNC55% breath sounds coarse/diminished-BUL & diminished-BLL GI: soft, rounded, non tender, bs x 4 Skin: Limited exam-patient still in street clothes> no rashes/lesions noted Extremities: warm/dry, pulses + 2 R/P, trace edema noted BLE  Resolved Hospital Problem list     Assessment & Plan:  Acute Hypoxic Respiratory Failure       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  - continue home Vitamin C & Zinc  Type 2 Diabetes Mellitus At Risk for Steroid Induced Hyperglycemia - Monitor CBG AC, HS - SSI moderate dosing, continue home long-acting coverage (solostar 40 units daily) >> Lantus 20 units BID - target range while in  ICU: 140-180 - follow ICU hyper/hypo-glycemia protocol - Carb-modified diet in place  BPH - continue home flomax - strict I & O's, monitor for urinary retention  Hyperlipidemia  Hypertension - continue home rosuvastatin - due to SIRS response to suspected Pneumonia with marginal-normal BP, will hold outpatient anti-hypertensives. Consider restarting: Losartan as patient stabilizes - PRN hydralazine if SBP > 160  Best Practice (right click and "Reselect all SmartList Selections" daily)  Diet/type: Regular consistency (see orders) DVT prophylaxis: LMWH GI prophylaxis: PPI Lines: N/A Foley:  N/A Code Status:  full code Last date of multidisciplinary goals of care discussion [05/30/2021]  IMAGING   Recent Labs  Lab 06/06/21 0449 06/09/21 0517 06/12/21 0435  WBC 10.2 12.3* 10.4  HGB 13.2 12.6* 12.6*  HCT 40.8 37.5* 38.5*  MCV 93.2 94.9 93.7  PLT 328 223 215     Basic Metabolic Panel: Recent Labs  Lab 06/06/21 0449 06/09/21 0517 06/12/21 0435  NA 136 135 140  K 4.1 4.0 4.4  CL 93* 92* 98  CO2 38* 36* 35*  GLUCOSE 242* 186* 193*  BUN _0 CREATININE 0.72 0.59* 0.49*  CALCIUM 8.3* 8.2* 8.9  MG 2.3  --   --   PHOS 4.6  --   --     GFR: Estimated Creatinine Clearance: 101.9 mL/min (A) (by C-G formula based on SCr of 0.49 mg/dL (L)). Recent Labs  Lab 06/06/21 0449 06/09/21 0517 06/12/21 0435  WBC 10.2 12.3* 10.4     Liver Function Tests: No results for input(s): AST, ALT, ALKPHOS, BILITOT, PROT, ALBUMIN in the last 168 hours.  No results for input(s): LIPASE, AMYLASE in the last 168 hours. No results for input(s): AMMONIA in the last 168 hours.  ABG No results found for: PHART, PCO2ART, PO2ART, HCO3, TCO2, ACIDBASEDEF, O2SAT   Coagulation Profile: No results for input(s): INR, PROTIME in the last 168 hours.  Cardiac Enzymes: No results for input(s): CKTOTAL, CKMB, CKMBINDEX, TROPONINI in the last 168 hours.  HbA1C: Hgb A1c MFr Bld   Date/Time Value Ref Range Status  05/09/2021 06:18 AM 8.6 (H) 4.8 - 5.6 % Final    Comment:    (NOTE) Pre diabetes:          5.7%-6.4%  Diabetes:              >6.4%  Glycemic control for   <7.0% adults with diabetes     CBG: Recent Labs  Lab 06/11/21 1208 06/11/21 1629 06/11/21 2105 06/12/21 0757 06/12/21 1153  GLUCAP 160* 265* 248* 285* 233*     Review of Systems: Positives in BOLD   Gen: Denies fever, chills, weight change, fatigue, night sweats HEENT: Denies blurred vision, double vision, hearing loss, tinnitus, sinus congestion, rhinorrhea, sore throat, neck stiffness, dysphagia PULM: Denies shortness of breath, cough, sputum production, hemoptysis, wheezing CV: Denies chest pain, edema, orthopnea, paroxysmal nocturnal dyspnea, palpitations GI: Denies abdominal pain, nausea, vomiting, diarrhea, hematochezia, melena, constipation, change in bowel habits GU: Denies dysuria, hematuria, polyuria, oliguria, urethral discharge Endocrine: Denies hot or cold intolerance, polyuria, polyphagia or appetite change Derm: Denies rash, dry skin, scaling or peeling skin change Heme: Denies easy bruising, bleeding, bleeding gums Neuro: Denies headache, numbness, weakness, slurred speech, loss of memory or consciousness  Past Medical History:  He,  has a past medical history of Chronic airway obstruction (HCC), DDD (degenerative disc disease), lumbar, Dupuytren's disease, Dyslipidemia (04/07/2014), Esophageal reflux (04/20/2014), Hypertension (04/20/2014), Microalbuminuria (04/07/2014), Microalbuminuria, Obesity, unspecified (04/07/2014), Sleep apnea (04/20/2014), and 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   Surgical History:   Past Surgical History:  Procedure Laterality Date   BACK SURGERY  1992, 2005   CLOSED REDUCTION FINGER WITH PERCUTANEOUS PINNING Left 02/12/2020   Procedure: Closed reduction and pinning of left first metacarpal fracture with possible  open reduction;  Surgeon: Hessie Knows, MD;  Location: [facility] ORS;  Service: Orthopedics;  Laterality: Left;   COLONOSCOPY WITH PROPOFOL N/A 12/28/2017   Procedure: COLONOSCOPY WITH PROPOFOL;  Surgeon: Manya Silvas, MD;  Location: [facility] ENDOSCOPY;  Service: Endoscopy;  Laterality: N/A;   NASAL SEPTUM SURGERY     RADIOACTIVE SEED IMPLANT N/A 04/12/2021   Procedure: RADIOACTIVE SEED IMPLANT/BRACHYTHERAPY IMPLANT;  Surgeon: Abbie Sons, MD;  Location: [facility] ORS;  Service: Urology;  Laterality: N/A;  73 seeds implanted   VASECTOMY  2005     Social History:   reports that he has quit smoking. He has never used smokeless tobacco. He reports that he does not currently use alcohol. He reports that he does not currently use drugs.   Family History:  His family history is negative for Prostate cancer and Kidney cancer.   Allergies No Known Allergies   Home Medications  Prior to Admission medications   Medication Sig Start Date End Date Taking? Authorizing Provider  albuterol (VENTOLIN HFA) 108 (90 Base) MCG/ACT inhaler Inhale 2 puffs into the lungs every 6 (six) hours as needed for wheezing or shortness of breath. 01/20/20  Yes [provider]  ascorbic acid (VITAMIN C) 1000 MG tablet Take 0.5 tablets (500 mg total) by mouth daily. 05/14/21  Yes Wieting, Richard, MD  cholecalciferol (VITAMIN D) 25 MCG (1000 UNIT) tablet Take 1,000 Units by mouth in the morning.   Yes [provider]  fluticasone-salmeterol (ADVAIR HFA) 115-21 MCG/ACT inhaler Inhale 2 puffs into the lungs 2 (two) times daily.   Yes [provider]  furosemide (LASIX) 20 MG tablet Take 20 mg by mouth daily. 05/26/21  Yes [provider]  gabapentin (NEURONTIN) 300 MG capsule Take 300 mg by mouth 3 (three) times daily. 05/26/21  Yes [provider]  insulin aspart (NOVOLOG) 100 UNIT/ML FlexPen Inject 8 Units into the skin 3 (three) times daily with meals. Okay to substitute generic 05/13/21   Yes Wieting, Richard, MD  losartan (COZAAR) 50 MG tablet Take 50 mg by mouth in the morning. 10/07/17  Yes [provider]  OZEMPIC, 1 MG/DOSE, 4 MG/3ML SOPN Inject 1 mg into the skin every Sunday. 07/29/19  Yes [provider]  pantoprazole (PROTONIX) 40 MG tablet Take 40 mg by mouth 2 (two) times daily. 10/19/17  Yes [provider]  pioglitazone (ACTOS) 30 MG tablet Take 30 mg by mouth in the morning. 07/27/19  Yes [provider]  rosuvastatin (CRESTOR) 5 MG tablet Take 5 mg by mouth every Monday, Wednesday, and Friday. In the morning 04/19/18  Yes [provider]  tamsulosin (FLOMAX) 0.4 MG CAPS capsule Take 1 capsule (0.4 mg total) by mouth daily. 03/25/21  Yes Vaillancourt, Samantha, PA-C  TOUJEO MAX SOLOSTAR 300 UNIT/ML Solostar Pen Inject 50 Units into the skin in the morning. Patient taking differently: Inject 40 Units into the skin in the morning. 05/13/21  Yes Wieting, Richard, MD  XIGDUO XR 12-998 MG TB24 Take 2 tablets by mouth every morning. 01/31/21  Yes [provider]  zinc sulfate 220 (50 Zn) MG capsule Take 1 capsule (220 mg total) by mouth daily. 05/14/21  Yes Wieting, Richard, MD  aspirin EC 81 MG tablet Take 81 mg by mouth in the morning. Swallow whole. Patient not taking: Reported on 05/30/2021    [provider]  chlorpheniramine-HYDROcodone (TUSSIONEX) 10-8 MG/5ML SUER Take 5 mLs by mouth every 12 (twelve) hours as needed for cough. Patient not taking: No sig reported 05/13/21   Loletha Grayer, MD  feeding supplement, GLUCERNA SHAKE, (GLUCERNA SHAKE) LIQD Take 237 mLs by mouth 2 (two) times daily between meals. 05/13/21   Loletha Grayer, MD  fluticasone-salmeterol (ADVAIR HFA) 034-91 MCG/ACT inhaler Inhale 2 puffs into the lungs 2 (two) times daily. Rinse mouth out with water after use Patient not taking: No sig reported 05/13/21   Loletha Grayer, MD  Insulin Pen Needle 33G X 4 MM MISC 1 Dose by Does not apply route 3  (three) times daily before meals. 05/13/21   Loletha Grayer, MD  predniSONE (DELTASONE) 10 MG tablet Three tabs po daily for five days Patient not taking: No sig reported 05/13/21   Loletha Grayer, MD       Ottie Glazier, M.D.  Pulmonary &  Critical Care Medicine

## 2021-06-12 NOTE — Progress Notes (Signed)
Dylan Pittman  WCH:[id number] DOB: 17-Aug-1951 DOA: 05/30/2021 PCP: Sofie Hartigan, MD    Brief Narrative:  70 year old with a history of pulmonary fibrosis, HTN, HLD, DM2, obstructive sleep apnea, prostate cancer status post seed ablation and Lupron, BPH, and GERD who was admitted after presenting to the [facility] ED from home 10/3 with progressive hypoxia at rest and severe dyspnea on exertion.  The patient had been hospitalized with COVID pneumonia 9/12 >05/13/2021.  He stated that his dyspnea only worsened after being discharged home with increasing oxygen support requirements each day.  He noted his saturations dropping into the 50-60% range with exertion.  Significant Events:  10/3 admit via ED - required [place] 10/6 weaning of [place]  10/8 weaned to nasal cannula at 10 L [place]  10/12 weaned to 7L, symptomatically improving  Consultants:  PCCM  Code Status: FULL CODE  Antimicrobials:  Azithromycin 10/5 >  Cefepime 10/3 > 10/4 Vancomycin 10/3 > 10/5  DVT prophylaxis: Lovenox  Subjective: No new events reported overnight.  Remains dyspneic with exertion, this is improving. Otherwise feels well  Assessment & Plan:  Acute on chronic hypoxic respiratory failure - Post CoViD ILD / Pulm Fibrosis - ILD w/ acute exacerbation - possible Legionella  Care per Pulmonary -weaning oxygen as able - pulm weaning steroids, now transitioned to oral dexamethasone. Slowly weaning o2, down to 5 L today. Is s/p 10 days azith for possible legionella. Continuing pulm toilet, inhalers. Bactrim started for pneumocystis ppx. Pulm thinks could be weeks until stable enough to return home, have messaged toc about ltac or other options.  DM2 CBG mild elevations fasting, pre-prandial wnl. Increase semglee from 38 to 42 bid, cont mealtime 10 w/ meals, continue ssi  Anal fissure v/s small hemorrhoid - scant BRBPR resolved Keep lubricated w/ vaseline or neosporin - trial of anusol suppository - keep stools  loose - monitor   BPH Continue usual home medical therapy  HLD Continue usual home medical therapy  HTN Bp mildly elevated, resume home lantus  Family Communication: son updated @ bedside 10/16  Remains inpatient appropriate because:Inpatient level of care appropriate due to severity of illness  Dispo: The patient is from: Home              Anticipated d/c is to: Home w/ home health PT vs ltac vs other?              Patient currently is not medically stable to d/c.   Difficult to place patient No  Objective: Blood pressure (!) 153/84, pulse 96, temperature 97.8 F (36.6 C), temperature source Oral, resp. rate 18, height 5\' 8"  (1.727 m), weight 101.1 kg, SpO2 92 %.  Intake/Output Summary (Last 24 hours) at 06/12/2021 1257 Last data filed at 06/12/2021 1155 Gross per 24 hour  Intake 480 ml  Output 5050 ml  Net -4570 ml   Filed Weights   06/05/21 0500 06/06/21 0258 06/07/21 0500  Weight: 98.6 kg 100.6 kg 101.1 kg    Examination: General: No acute respiratory distress but dyspnic with minimal movement Lungs: no wheezing, faint rales Cardiovascular: Regular rate and rhythm without murmur  Abdomen: Nontender, nondistended, soft, bowel sounds positive, no rebound Extremities: Trace bilateral lower extremity edema  CBC: Recent Labs  Lab 06/06/21 0449 06/09/21 0517 06/12/21 0435  WBC 10.2 12.3* 10.4  HGB 13.2 12.6* 12.6*  HCT 40.8 37.5* 38.5*  MCV 93.2 94.9 93.7  PLT 328 223 353   Basic Metabolic Panel: Recent Labs  Lab 06/06/21  5573 06/09/21 0517 06/12/21 0435  NA 136 135 140  K 4.1 4.0 4.4  CL 93* 92* 98  CO2 38* 36* 35*  GLUCOSE 242* 186* 193*  BUN 18 16 18   CREATININE 0.72 0.59* 0.49*  CALCIUM 8.3* 8.2* 8.9  MG 2.3  --   --   PHOS 4.6  --   --    GFR: Estimated Creatinine Clearance: 101.9 mL/min (A) (by C-G formula based on SCr of 0.49 mg/dL (L)).  Liver Function Tests: No results for input(s): AST, ALT, ALKPHOS, BILITOT, PROT, ALBUMIN in the  last 168 hours.   HbA1C: Hgb A1c MFr Bld  Date/Time Value Ref Range Status  05/09/2021 06:18 AM 8.6 (H) 4.8 - 5.6 % Final    Comment:    (NOTE) Pre diabetes:          5.7%-6.4%  Diabetes:              >6.4%  Glycemic control for   <7.0% adults with diabetes     CBG: Recent Labs  Lab 06/11/21 1208 06/11/21 1629 06/11/21 2105 06/12/21 0757 06/12/21 1153  GLUCAP 160* 265* 248* 285* 233*    Recent Results (from the past 240 hour(s))  Legionella, DFA (w/out culture)     Status: None   Collection Time: 06/04/21  4:53 AM   Specimen: Blood  Result Value Ref Range Status   Source, Legionella Cul SPUTUM  Final    Comment: Performed at [facility], [address], [place], [place] [address]   Legionella Pneumophila DFA Negative Negative Final    Comment: (NOTE) Performed At: [facility] [place], [place] [id number] Rush Farmer MD EG:[id number]      Scheduled Meds:  Chlorhexidine Gluconate Cloth  6 each Topical Q0600   chlorpheniramine-HYDROcodone  5 mL Oral Q4H   dexamethasone  8 mg Oral Q12H   enoxaparin (LOVENOX) injection  0.5 mg/kg Subcutaneous Q24H   fluticasone furoate-vilanterol  1 puff Inhalation Daily   furosemide  20 mg Oral Daily   insulin aspart  0-20 Units Subcutaneous TID AC & HS   insulin aspart  10 Units Subcutaneous TID WC   insulin glargine-yfgn  38 Units Subcutaneous BID   ipratropium-albuterol  3 mL Nebulization Q6H   [START ON 06/13/2021] losartan  50 mg Oral q AM   neomycin-bacitracin-polymyxin   Topical BID   pantoprazole  40 mg Oral Daily   rosuvastatin  5 mg Oral Q M,W,F   [START ON 06/13/2021] sulfamethoxazole-trimethoprim  1 tablet Oral Once per day on Mon Wed Fri   tamsulosin  0.4 mg Oral Daily   zinc sulfate  220 mg Oral Daily      LOS: 13 days   Laurey Arrow, MD   If 7PM-7AM, please contact night-coverage per Amion 06/12/2021, 12:57 PM

## 2021-06-13 DIAGNOSIS — J9621 Acute and chronic respiratory failure with hypoxia: Secondary | ICD-10-CM | POA: Diagnosis not present

## 2021-06-13 LAB — GLUCOSE, CAPILLARY
Glucose-Capillary: 199 mg/dL — ABNORMAL HIGH (ref 70–99)
Glucose-Capillary: 326 mg/dL — ABNORMAL HIGH (ref 70–99)
Glucose-Capillary: 211 mg/dL — ABNORMAL HIGH (ref 70–99)
Glucose-Capillary: 177 mg/dL — ABNORMAL HIGH (ref 70–99)

## 2021-06-13 LAB — BASIC METABOLIC PANEL
Anion gap: 10 (ref 5–15)
BUN: 20 mg/dL (ref 8–23)
CO2: 35 mmol/L — ABNORMAL HIGH (ref 22–32)
Calcium: 9.5 mg/dL (ref 8.9–10.3)
Chloride: 93 mmol/L — ABNORMAL LOW (ref 98–111)
Creatinine, Ser: 0.76 mg/dL (ref 0.61–1.24)
GFR, Estimated: >60 mL/min (ref >60)
Glucose, Bld: 192 mg/dL — ABNORMAL HIGH (ref 70–99)
Potassium: 5.1 mmol/L (ref 3.5–5.1)
Sodium: 138 mmol/L (ref 135–145)

## 2021-06-13 LAB — MAGNESIUM: Magnesium: 2.2 mg/dL (ref 1.7–2.4)

## 2021-06-13 LAB — PHOSPHORUS: Phosphorus: 5.8 mg/dL — ABNORMAL HIGH (ref 2.5–4.6)

## 2021-06-13 LAB — C-REACTIVE PROTEIN: CRP: 1.2 mg/dL — ABNORMAL HIGH (ref <1.0)

## 2021-06-13 MED ORDER — LORAZEPAM 0.5 MG PO TABS
0.5000 mg | ORAL_TABLET | Freq: Four times a day (QID) | ORAL | Status: DC | PRN
  Administered 2021-06-13 – 2021-06-17 (×6): 0.5 mg via ORAL
  Filled 2021-06-13 (×7): qty 1

## 2021-06-13 MED ORDER — PANTOPRAZOLE SODIUM 40 MG PO TBEC
40.0000 mg | DELAYED_RELEASE_TABLET | Freq: Two times a day (BID) | ORAL | Status: DC
  Administered 2021-06-13 – 2021-06-17 (×9): 40 mg via ORAL
  Filled 2021-06-13 (×9): qty 1

## 2021-06-13 MED ORDER — INSULIN GLARGINE-YFGN 100 UNIT/ML ~~LOC~~ SOLN
46.0000 [IU] | Freq: Two times a day (BID) | SUBCUTANEOUS | Status: DC
  Administered 2021-06-13 – 2021-06-14 (×2): 46 [IU] via SUBCUTANEOUS
  Filled 2021-06-13 (×3): qty 0.46

## 2021-06-13 MED ORDER — HYDROCOD POLST-CPM POLST ER 10-8 MG/5ML PO SUER: 5.0000 mL | ORAL | Status: DC | PRN

## 2021-06-13 NOTE — Progress Notes (Signed)
NAME:  Dylan Pittman, MRN:  [id number], DOB:  Mar 16, 1951, LOS: 55 ADMISSION DATE:  05/30/2021, CONSULTATION DATE:  05/30/21 REFERRING MD:  Dr. Creig Hines, CHIEF COMPLAINT:   Shortness of breath  History of Present Illness:  70 year old male presenting to [facility] ED from home on 05/30/2021 with complaints of progressive hypoxia at rest/with exertion and dyspnea with exertion.  Of note patient was recently hospitalized with COVID-19 pneumonia from 05/09/2021 to 05/13/2021.  He describes that since discharge he now wears continuous oxygen via nasal cannula.  He started at 2 L at rest and 4 L with ambulation, but reports he has had to increase to 5 L nasal cannula at rest which maintains his SPO2 greater than 90%.  He reports with any exertion his SPO2 drops into the 50s to 60%.  Earlier today he took a shower with his wife's assistance his oxygen dropped to 58% and it took 45 minutes for him to recover above 90% with his nasal cannula.  He does admit to feeling dizzy today correlating with his hypoxia, but denies any recent falls or injuries. He does have a CPAP machine to wear at night for his OSA, but admits that since discharge there was equipment difficulties and he has not been able to use it consistently.  He complains of persistent coughing spells with mostly clear phlegm, however earlier today he did notice some red streaks in his sputum.  He denies chest pain, nausea/vomiting/diarrhea/abdominal pain, fever/chills, or sore throat.      Pertinent  Medical History  Pulmonary fibrosis Hypertension Hyperlipidemia Type 2 diabetes Obstructive sleep apnea COVID-19 (September 2022) Prostate cancer status post seed ablation and Lupron injections BPH GERD Bronchitis Significant Hospital Events: Including procedures, antibiotic start and stop dates in addition to other pertinent events   05/30/2021: Admit to ICU on heated high flow nasal cannula at 75%/40 L in the secondary to suspected persistent pneumonia  from recent COVID-19 infection in the setting of chronic interstitial lung disease 06/01/21- Patient  is improved, reviewed medical plan with patient and wife 06/02/21-patient weaned to 50%/45L/min continue steroids , repeat blood work.  06/03/21- patient improved, met with wife and reviewed care plan today.  Legionella + on ab test but Urine negative suggestive of serotype 2 will order DFA and legionella culture. Increased zithromax to 500 daily 06/04/21- continue current care plan , patient is improved and weaned to 10L/min [place] 06/05/21- patient improved on supplemental O2, met with wife and reviewed care plan. Wil transfer to med surg with tele and start PT/OT 06/06/21- Patient is slightly improved, he is still coughing a lot and states the codiene cough suryp helps but not lasting but two hours so I have increased frequncely.  He still has high amount of phlegm on expectoration but inspissated unable to expectorate.  We have initiated mucomyst therapy today. Continue PT/OT.  Reviewed care plan with RT Beverlee Nims.  Met with friend and pastor of patient by name of Edmon Crape.  06/07/21- patient improved, weaned to 9L/min [place].  Worked well with mucomyst states he was able to expectorate copious phlegm.   06/08/21- cough is 80% less, patient ambulating 4 laps around hallways, FiO2 reduced to 7L/min.  Reduced rhonchi bilaterally on auscultation.  Will continue mucmoyst one more day and keep prednisone at 23m for one more day before weaning 06/09/21- patient improved he is on 6L/min.  Shares mucomyst is helping with expectoration of thickened phlegm.   06/10/21- patient on 6L/min [place]. He is slowly improving but  remains with resp distress and desaturates with any exertion.    06/11/21- patient feels slightly worse. He is normoxic on 5L/min.  There is CXR to be repeated today.  I will change steroids to Dexamethrasone bid PO from prednisone. Continue PT/OT, cotninue wean O2 with goal 88 at rest   06/12/21- Patient  is on 5L/min still with quick desaturation on any activity.  He is very labored with respiratory status. He may need LTAC for weaninf from O2 demands.  Reviewed with Dr Si Raider.   06/13/21- patient vomited post dinner yesterday but denies aspiration.  He felt GERD symptoms and indigestion.  I have upgraded protonix to BID which is what he takes at home.   Objective   Blood pressure 133/89, pulse 100, temperature 97.8 F (36.6 C), resp. rate 20, height '5\' 8"'  (1.727 m), weight 101.1 kg, SpO2 (!) 87 %.        Intake/Output Summary (Last 24 hours) at 06/13/2021 0713 Last data filed at 06/13/2021 0554 Gross per 24 hour  Intake 480 ml  Output 2605 ml  Net -2125 ml    Filed Weights   06/05/21 0500 06/06/21 0258 06/07/21 0500  Weight: 98.6 kg 100.6 kg 101.1 kg    Examination: General: Adult male, acutely ill, lying in bed, NAD HEENT: MM pink/moist, anicteric, atraumatic, neck supple Neuro: A&O x 4, able to follow commands, PERRL +3, MAE CV: s1s2 RRR, sinus tachycardia on monitor, no r/m/g Pulm: Rmild rhonchi bilaterally  breath sounds coarse/diminished-BUL & diminished-BLL GI: soft, rounded, non tender, bs x 4 Skin: Limited exam-patient still in street clothes> no rashes/lesions noted Extremities: warm/dry, pulses + 2 R/P, trace edema noted BLE  Resolved Hospital Problem list     Assessment & Plan:  Acute Hypoxic Respiratory Failure       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 with multifocal pneumonia           -Legionella Ab + , UG neg, DFA and legionella culture negative  - Continue HHFNC, wean FiO2 as tolerated > may need to transition to BIPAP overnight d/t OSA - Supplemental O2 to maintain SpO2 > 88% - Intermittent chest x-ray & ABG PRN - Ensure adequate pulmonary hygiene: IS Q 1 h x 10 while awake - changing to dexamethasone 6 bid then tapering.  - dcd home Vitamin C & Zinc -increased protonix to bid Type 2 Diabetes Mellitus At Risk for  Steroid Induced Hyperglycemia - Monitor CBG AC, HS - SSI moderate dosing, continue home long-acting coverage (solostar 40 units daily) >> Lantus 20 units BID - target range while in ICU: 140-180 - follow ICU hyper/hypo-glycemia protocol - Carb-modified diet in place -contiue dexamethasone at current dose   BPH - continue home flomax - strict I & O's, monitor for urinary retention  Hyperlipidemia  Hypertension - continue home rosuvastatin - due to SIRS response to suspected Pneumonia with marginal-normal BP, will hold outpatient anti-hypertensives. Consider restarting: Losartan as patient stabilizes - PRN hydralazine if SBP > 160  Best Practice (right click and "Reselect all SmartList Selections" daily)  Diet/type: Regular consistency (see orders) DVT prophylaxis: LMWH GI prophylaxis: PPI Lines: N/A Foley:  N/A Code Status:  full code Last date of multidisciplinary goals of care discussion [05/30/2021]  IMAGING   Recent Labs  Lab 06/09/21 0517 06/12/21 0435  WBC 12.3* 10.4  HGB 12.6* 12.6*  HCT 37.5* 38.5*  MCV 94.9 93.7  PLT 223 215     Basic Metabolic Panel: Recent Labs  Lab 06/09/21 0517 06/12/21 0435  NA 135 140  K 4.0 4.4  CL 92* 98  CO2 36* 35*  GLUCOSE 186* 193*  BUN 16 18  CREATININE 0.59* 0.49*  CALCIUM 8.2* 8.9    GFR: Estimated Creatinine Clearance: 101.9 mL/min (A) (by C-G formula based on SCr of 0.49 mg/dL (L)). Recent Labs  Lab 06/09/21 0517 06/12/21 0435  WBC 12.3* 10.4     Liver Function Tests: No results for input(s): AST, ALT, ALKPHOS, BILITOT, PROT, ALBUMIN in the last 168 hours.  No results for input(s): LIPASE, AMYLASE in the last 168 hours. No results for input(s): AMMONIA in the last 168 hours.  ABG No results found for: PHART, PCO2ART, PO2ART, HCO3, TCO2, ACIDBASEDEF, O2SAT   Coagulation Profile: No results for input(s): INR, PROTIME in the last 168 hours.  Cardiac Enzymes: No results for input(s): CKTOTAL, CKMB,  CKMBINDEX, TROPONINI in the last 168 hours.  HbA1C: Hgb A1c MFr Bld  Date/Time Value Ref Range Status  05/09/2021 06:18 AM 8.6 (H) 4.8 - 5.6 % Final    Comment:    (NOTE) Pre diabetes:          5.7%-6.4%  Diabetes:              >6.4%  Glycemic control for   <7.0% adults with diabetes     CBG: Recent Labs  Lab 06/11/21 2105 06/12/21 0757 06/12/21 1153 06/12/21 1647 06/12/21 2128  GLUCAP 248* 285* 233* 328* 171*     Review of Systems: Positives in BOLD   Gen: Denies fever, chills, weight change, fatigue, night sweats HEENT: Denies blurred vision, double vision, hearing loss, tinnitus, sinus congestion, rhinorrhea, sore throat, neck stiffness, dysphagia PULM: Denies shortness of breath, cough, sputum production, hemoptysis, wheezing CV: Denies chest pain, edema, orthopnea, paroxysmal nocturnal dyspnea, palpitations GI: Denies abdominal pain, nausea, vomiting, diarrhea, hematochezia, melena, constipation, change in bowel habits GU: Denies dysuria, hematuria, polyuria, oliguria, urethral discharge Endocrine: Denies hot or cold intolerance, polyuria, polyphagia or appetite change Derm: Denies rash, dry skin, scaling or peeling skin change Heme: Denies easy bruising, bleeding, bleeding gums Neuro: Denies headache, numbness, weakness, slurred speech, loss of memory or consciousness  Past Medical History:  He,  has a past medical history of Chronic airway obstruction (HCC), DDD (degenerative disc disease), lumbar, Dupuytren's disease, Dyslipidemia (04/07/2014), Esophageal reflux (04/20/2014), Hypertension (04/20/2014), Microalbuminuria (04/07/2014), Microalbuminuria, Obesity, unspecified (04/07/2014), Sleep apnea (04/20/2014), and 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   Surgical History:   Past Surgical History:  Procedure Laterality Date   BACK SURGERY  1992, 2005   CLOSED REDUCTION FINGER WITH PERCUTANEOUS PINNING Left 02/12/2020   Procedure: Closed  reduction and pinning of left first metacarpal fracture with possible open reduction;  Surgeon: Hessie Knows, MD;  Location: [facility] ORS;  Service: Orthopedics;  Laterality: Left;   COLONOSCOPY WITH PROPOFOL N/A 12/28/2017   Procedure: COLONOSCOPY WITH PROPOFOL;  Surgeon: Manya Silvas, MD;  Location: [facility] ENDOSCOPY;  Service: Endoscopy;  Laterality: N/A;   NASAL SEPTUM SURGERY     RADIOACTIVE SEED IMPLANT N/A 04/12/2021   Procedure: RADIOACTIVE SEED IMPLANT/BRACHYTHERAPY IMPLANT;  Surgeon: Abbie Sons, MD;  Location: [facility] ORS;  Service: Urology;  Laterality: N/A;  73 seeds implanted   VASECTOMY  2005     Social History:   reports that he has quit smoking. He has never used smokeless tobacco. He reports that he does not currently use alcohol. He reports that he does not currently use drugs.   Family  History:  His family history is negative for Prostate cancer and Kidney cancer.   Allergies No Known Allergies   Home Medications  Prior to Admission medications   Medication Sig Start Date End Date Taking? Authorizing Provider  albuterol (VENTOLIN HFA) 108 (90 Base) MCG/ACT inhaler Inhale 2 puffs into the lungs every 6 (six) hours as needed for wheezing or shortness of breath. 01/20/20  Yes [provider]  ascorbic acid (VITAMIN C) 1000 MG tablet Take 0.5 tablets (500 mg total) by mouth daily. 05/14/21  Yes Wieting, Richard, MD  cholecalciferol (VITAMIN D) 25 MCG (1000 UNIT) tablet Take 1,000 Units by mouth in the morning.   Yes [provider]  fluticasone-salmeterol (ADVAIR HFA) 115-21 MCG/ACT inhaler Inhale 2 puffs into the lungs 2 (two) times daily.   Yes [provider]  furosemide (LASIX) 20 MG tablet Take 20 mg by mouth daily. 05/26/21  Yes [provider]  gabapentin (NEURONTIN) 300 MG capsule Take 300 mg by mouth 3 (three) times daily. 05/26/21  Yes [provider]  insulin aspart (NOVOLOG) 100 UNIT/ML FlexPen Inject 8 Units into the skin  3 (three) times daily with meals. Okay to substitute generic 05/13/21  Yes Wieting, Richard, MD  losartan (COZAAR) 50 MG tablet Take 50 mg by mouth in the morning. 10/07/17  Yes [provider]  OZEMPIC, 1 MG/DOSE, 4 MG/3ML SOPN Inject 1 mg into the skin every Sunday. 07/29/19  Yes [provider]  pantoprazole (PROTONIX) 40 MG tablet Take 40 mg by mouth 2 (two) times daily. 10/19/17  Yes [provider]  pioglitazone (ACTOS) 30 MG tablet Take 30 mg by mouth in the morning. 07/27/19  Yes [provider]  rosuvastatin (CRESTOR) 5 MG tablet Take 5 mg by mouth every Monday, Wednesday, and Friday. In the morning 04/19/18  Yes [provider]  tamsulosin (FLOMAX) 0.4 MG CAPS capsule Take 1 capsule (0.4 mg total) by mouth daily. 03/25/21  Yes Vaillancourt, Samantha, PA-C  TOUJEO MAX SOLOSTAR 300 UNIT/ML Solostar Pen Inject 50 Units into the skin in the morning. Patient taking differently: Inject 40 Units into the skin in the morning. 05/13/21  Yes Wieting, Richard, MD  XIGDUO XR 12-998 MG TB24 Take 2 tablets by mouth every morning. 01/31/21  Yes [provider]  zinc sulfate 220 (50 Zn) MG capsule Take 1 capsule (220 mg total) by mouth daily. 05/14/21  Yes Wieting, Richard, MD  aspirin EC 81 MG tablet Take 81 mg by mouth in the morning. Swallow whole. Patient not taking: Reported on 05/30/2021    [provider]  chlorpheniramine-HYDROcodone (TUSSIONEX) 10-8 MG/5ML SUER Take 5 mLs by mouth every 12 (twelve) hours as needed for cough. Patient not taking: No sig reported 05/13/21   Loletha Grayer, MD  feeding supplement, GLUCERNA SHAKE, (GLUCERNA SHAKE) LIQD Take 237 mLs by mouth 2 (two) times daily between meals. 05/13/21   Loletha Grayer, MD  fluticasone-salmeterol (ADVAIR HFA) 409-81 MCG/ACT inhaler Inhale 2 puffs into the lungs 2 (two) times daily. Rinse mouth out with water after use Patient not taking: No sig reported 05/13/21   Loletha Grayer, MD   Insulin Pen Needle 33G X 4 MM MISC 1 Dose by Does not apply route 3 (three) times daily before meals. 05/13/21   Loletha Grayer, MD  predniSONE (DELTASONE) 10 MG tablet Three tabs po daily for five days Patient not taking: No sig reported 05/13/21   Loletha Grayer, MD       Ottie Glazier, M.D.  Pulmonary & Critical Care Medicine

## 2021-06-13 NOTE — Progress Notes (Signed)
Dylan Pittman  VEH:[id number] DOB: [birth year] DOA: 05/30/2021 PCP: Sofie Hartigan, MD    Brief Narrative:  70 year old with a history of pulmonary fibrosis, HTN, HLD, DM2, obstructive sleep apnea, prostate cancer status post seed ablation and Lupron, BPH, and GERD who was admitted after presenting to the [facility] ED from home 10/3 with progressive hypoxia at rest and severe dyspnea on exertion.  The patient had been hospitalized with COVID pneumonia 9/12 >05/13/2021.  He stated that his dyspnea only worsened after being discharged home with increasing oxygen support requirements each day.  He noted his saturations dropping into the 50-60% range with exertion.  Significant Events:  10/3 admit via ED - required [place] 10/6 weaning of [place]  10/8 weaned to nasal cannula at 10 L [place]  10/12 weaned to 7L, symptomatically improving  Consultants:  PCCM  Code Status: FULL CODE  Antimicrobials:  Azithromycin 10/5 >  Cefepime 10/3 > 10/4 Vancomycin 10/3 > 10/5  DVT prophylaxis: Lovenox  Subjective: No new events reported overnight.  Remains dyspneic with exertion, this is improving. Otherwise feels well  Assessment & Plan:  Acute on chronic hypoxic respiratory failure - Post CoViD ILD / Pulm Fibrosis - ILD w/ acute exacerbation - possible Legionella  Care per Pulmonary -weaning oxygen as able - pulm weaning steroids, now transitioned to oral dexamethasone. Slowly weaning o2, down to 5 L today. Is s/p 10 days azith for possible legionella. Continuing pulm toilet, inhalers. Bactrim started for pneumocystis ppx. Pulm thinks could be weeks until stable enough to return home, have messaged toc about ltac or other options, they will explore those today. K 5.1 today, monitor closely while on bactrim. Ordering ativan prn for anxiety  DM2 CBG mild elevations fasting, pre-prandial wnl. Increase semglee from 42 to 46 bid, cont mealtime 10 w/ meals, continue ssi  Anal fissure v/s small hemorrhoid -  scant BRBPR resolved Keep lubricated w/ vaseline or neosporin - trial of anusol suppository - keep stools loose - monitor   BPH Continue usual home medical therapy  HLD Continue usual home medical therapy  HTN Wnl, hold losartan given up-trendking potassium  Family Communication: wife updated @ bedside 10/17  Remains inpatient appropriate because:Inpatient level of care appropriate due to severity of illness  Dispo: The patient is from: Home              Anticipated d/c is to: Home w/ home health PT vs ltac vs other?              Patient currently is not medically stable to d/c.   Difficult to place patient No  Objective: Blood pressure 128/72, pulse 95, temperature 97.8 F (36.6 C), resp. rate (!) 22, height 5\' 8"  (1.727 m), weight 101.1 kg, SpO2 98 %.  Intake/Output Summary (Last 24 hours) at 06/13/2021 1220 Last data filed at 06/13/2021 1026 Gross per 24 hour  Intake 480 ml  Output 1505 ml  Net -1025 ml   Filed Weights   06/05/21 0500 06/06/21 0258 06/07/21 0500  Weight: 98.6 kg 100.6 kg 101.1 kg    Examination: General: No acute respiratory distress but dyspnic with minimal movement Lungs: no wheezing, faint rales Cardiovascular: Regular rate and rhythm without murmur  Abdomen: Nontender, nondistended, soft, bowel sounds positive, no rebound Extremities: Trace bilateral lower extremity edema  CBC: Recent Labs  Lab 06/09/21 0517 06/12/21 0435  WBC 12.3* 10.4  HGB 12.6* 12.6*  HCT 37.5* 38.5*  MCV 94.9 93.7  PLT 223 215  Basic Metabolic Panel: Recent Labs  Lab 06/09/21 0517 06/12/21 0435 06/13/21 0747  NA 135 140 138  K 4.0 4.4 5.1  CL 92* 98 93*  CO2 36* 35* 35*  GLUCOSE 186* 193* 192*  BUN 16 18 20   CREATININE 0.59* 0.49* 0.76  CALCIUM 8.2* 8.9 9.5  MG  --   --  2.2  PHOS  --   --  5.8*   GFR: Estimated Creatinine Clearance: 101.9 mL/min (by C-G formula based on SCr of 0.76 mg/dL).  Liver Function Tests: No results for input(s): AST,  ALT, ALKPHOS, BILITOT, PROT, ALBUMIN in the last 168 hours.   HbA1C: Hgb A1c MFr Bld  Date/Time Value Ref Range Status  05/09/2021 06:18 AM 8.6 (H) 4.8 - 5.6 % Final    Comment:    (NOTE) Pre diabetes:          5.7%-6.4%  Diabetes:              >6.4%  Glycemic control for   <7.0% adults with diabetes     CBG: Recent Labs  Lab 06/12/21 0757 06/12/21 1153 06/12/21 1647 06/12/21 2128 06/13/21 0735  GLUCAP 285* 233* 328* 171* 199*    Recent Results (from the past 240 hour(s))  Legionella, DFA (w/out culture)     Status: None   Collection Time: 06/04/21  4:53 AM   Specimen: Blood  Result Value Ref Range Status   Source, Legionella Cul SPUTUM  Final    Comment: Performed at [facility], [address], [place], [place] [address]   Legionella Pneumophila DFA Negative Negative Final    Comment: (NOTE) Performed At: [facility] [place], [place] [id number] Rush Farmer MD XB:[id number]      Scheduled Meds:  chlorpheniramine-HYDROcodone  5 mL Oral Q4H   dexamethasone  8 mg Oral Q12H   enoxaparin (LOVENOX) injection  0.5 mg/kg Subcutaneous Q24H   fluticasone furoate-vilanterol  1 puff Inhalation Daily   furosemide  20 mg Oral Daily   insulin aspart  0-20 Units Subcutaneous TID AC & HS   insulin aspart  10 Units Subcutaneous TID WC   insulin glargine-yfgn  46 Units Subcutaneous BID   ipratropium-albuterol  3 mL Nebulization Q6H   neomycin-bacitracin-polymyxin   Topical BID   pantoprazole  40 mg Oral BID AC   rosuvastatin  5 mg Oral Q M,W,F   sulfamethoxazole-trimethoprim  1 tablet Oral Once per day on Mon Wed Fri   tamsulosin  0.4 mg Oral Daily      LOS: 14 days   Laurey Arrow, MD   If 7PM-7AM, please contact night-coverage per Amion 06/13/2021, 12:20 PM

## 2021-06-13 NOTE — Progress Notes (Signed)
Physical Therapy Treatment Patient Details Name: Dylan Pittman MRN: [id number] DOB: 11/06/1950 Today's Date: 06/13/2021   History of Present Illness Patient is a 70 year old male presenting to [facility] ED from home on 05/30/2021 with complaints of progressive hypoxia at rest/with exertion and dyspnea with exertion.  Of note patient was recently hospitalized with COVID-19 pneumonia from 05/09/2021 to 05/13/2021.    PT Comments    Pt received seated in recliner agreeable to PT treatment. Resting on 5L/min with reduced coughing fits noted. SPO2 at 88-90% on 5L/min via [place] prior to mobility. Did decrease to 86% spontaneously requiring education and cuing for PLB to return to 90% taking > 5 min to reach prior to ambulation. Pt required 8 L/min for ambulation. Maintains Mod-I for transfers and supervision for walking in hallway. Intermittent need for 1-2 standing rest due to desat to 87-88% on 8 L/min on first two laps. On third lap attempting titrating pt to 6 L/min. Maintained 90-92% for ~80' then desat to 85%. Requiring 3 min on 6 L with no elevation to 90%. Titrated to 8 L/min and returned to >90% in < 30 sec. Remaining 100' back to room, pt required multiple standing rest breaks on 8L/min in order to return O2 to 89-90% with ~4 rest breaks requiring anywhere from 1-2 min rest and PLB before beginning to amb to return to room. This is progress however as pt has not amb on 8 L/min since hospitalization. Once returned to room pt requires non-rebreather mask due to desat to 80% after coughing fit to return to 93%.  Will continue to benefit from [facility] PT due to deficits in endurance from [place] upon returning to home environment to return to PLOF.   Recommendations for follow up therapy are one component of a multi-disciplinary discharge planning process, led by the attending physician.  Recommendations may be updated based on patient status, additional functional criteria and insurance authorization.  Follow Up  Recommendations  Home health PT     Equipment Recommendations  None recommended by PT    Recommendations for Other Services       Precautions / Restrictions Precautions Precautions: Fall Restrictions Weight Bearing Restrictions: No     Mobility  Bed Mobility Overal bed mobility: Modified Independent               Patient Response: Cooperative  Transfers Overall transfer level: Modified independent Equipment used: None                Ambulation/Gait Ambulation/Gait assistance: Supervision Gait Distance (Feet): 500 Feet Assistive device: None Gait Pattern/deviations: Step-through pattern;Decreased stride length     General Gait Details: Relies on 8L/min [place] with amb. On third lap, required 3+ standing rest breaks due to desat to 85-86% with 2-3 min rests for O2 to return to 89-90%. HR remains 108-110 BPM.   Stairs             Wheelchair Mobility    Modified Rankin (Stroke Patients Only)       Balance Overall balance assessment: Modified Independent Sitting-balance support: Feet supported Sitting balance-Leahy Scale: Normal     Standing balance support: No upper extremity supported;During functional activity Standing balance-Leahy Scale: Good                              Cognition Arousal/Alertness: Awake/alert Behavior During Therapy: WFL for tasks assessed/performed Overall Cognitive Status: Within Functional Limits for tasks assessed  Exercises      General Comments General comments (skin integrity, edema, etc.): INtermittent need for hand rail today but more to assist in steadying during standing rest breaks. No true reliance noted on it.      Pertinent Vitals/Pain Pain Assessment: No/denies pain    Home Living                      Prior Function            PT Goals (current goals can now be found in the care plan section) Acute Rehab PT  Goals Patient Stated Goal: to go home PT Goal Formulation: With patient Time For Goal Achievement: 06/20/21 Potential to Achieve Goals: Good Progress towards PT goals: Progressing toward goals    Frequency    Min 2X/week      PT Plan Current plan remains appropriate    Co-evaluation              AM-PAC PT "6 Clicks" Mobility   Outcome Measure  Help needed turning from your back to your side while in a flat bed without using bedrails?: None Help needed moving from lying on your back to sitting on the side of a flat bed without using bedrails?: None Help needed moving to and from a bed to a chair (including a wheelchair)?: None Help needed standing up from a chair using your arms (e.g., wheelchair or bedside chair)?: None Help needed to walk in hospital room?: None Help needed climbing 3-5 steps with a railing? : A Little 6 Click Score: 23    End of Session Equipment Utilized During Treatment: Oxygen Activity Tolerance: Patient tolerated treatment well Patient left: in chair;with family/visitor present Nurse Communication: Mobility status PT Visit Diagnosis: Difficulty in walking, not elsewhere classified (R26.2)     Time: 4132-4401 PT Time Calculation (min) (ACUTE ONLY): 26 min  Charges:  $Gait Training: 23-37 mins                    Salem Caster. Fairly IV, PT, DPT Physical Therapist- [facility]  06/13/2021, 11:35 AM

## 2021-06-13 NOTE — TOC Progression Note (Signed)
Transition of Care [facility]) - Progression Note    Patient Details  Name: Dylan Pittman MRN: [id number] Date of Birth: [birth year]  Transition of Care [facility]) CM/SW [place], RN Phone Number: 06/13/2021, 3:51 PM  Clinical Narrative:   Eyvonne Mechanic, reviewing chart for qualifications.  Will contact TOC with follow up    Expected Discharge Plan: [place] Barriers to Discharge: Continued Medical Work up  Expected Discharge Plan and Services Expected Discharge Plan: [place]   Discharge Planning Services: CM Consult Post Acute Care Choice: Accident arrangements for the past 2 months: Single Family Home                 DME Arranged:  (No DME recommended, has home o2 with adapt, no issues)                     Social Determinants of Health (SDOH) Interventions    Readmission Risk Interventions No flowsheet data found.

## 2021-06-14 DIAGNOSIS — J9621 Acute and chronic respiratory failure with hypoxia: Secondary | ICD-10-CM | POA: Diagnosis not present

## 2021-06-14 LAB — BASIC METABOLIC PANEL
Anion gap: 9 (ref 5–15)
BUN: 20 mg/dL (ref 8–23)
CO2: 32 mmol/L (ref 22–32)
Calcium: 8.5 mg/dL — ABNORMAL LOW (ref 8.9–10.3)
Chloride: 95 mmol/L — ABNORMAL LOW (ref 98–111)
Creatinine, Ser: 0.77 mg/dL (ref 0.61–1.24)
GFR, Estimated: >60 mL/min (ref >60)
Glucose, Bld: 244 mg/dL — ABNORMAL HIGH (ref 70–99)
Potassium: 4.4 mmol/L (ref 3.5–5.1)
Sodium: 136 mmol/L (ref 135–145)

## 2021-06-14 LAB — GLUCOSE, CAPILLARY
Glucose-Capillary: 174 mg/dL — ABNORMAL HIGH (ref 70–99)
Glucose-Capillary: 251 mg/dL — ABNORMAL HIGH (ref 70–99)
Glucose-Capillary: 126 mg/dL — ABNORMAL HIGH (ref 70–99)
Glucose-Capillary: 139 mg/dL — ABNORMAL HIGH (ref 70–99)

## 2021-06-14 LAB — LEGIONELLA CULTURE REFLEX

## 2021-06-14 LAB — LEGIONELLA SPECIES CULTURE

## 2021-06-14 LAB — C-REACTIVE PROTEIN: CRP: 0.8 mg/dL (ref <1.0)

## 2021-06-14 MED ORDER — HYDROCOD POLST-CPM POLST ER 10-8 MG/5ML PO SUER
5.0000 mL | Freq: Two times a day (BID) | ORAL | Status: DC | PRN
  Administered 2021-06-14 – 2021-06-17 (×6): 5 mL via ORAL
  Filled 2021-06-14 (×6): qty 5

## 2021-06-14 MED ORDER — INSULIN ASPART 100 UNIT/ML IJ SOLN
12.0000 [IU] | Freq: Three times a day (TID) | INTRAMUSCULAR | Status: DC
  Administered 2021-06-14 – 2021-06-17 (×8): 12 [IU] via SUBCUTANEOUS
  Filled 2021-06-14 (×9): qty 1

## 2021-06-14 MED ORDER — INSULIN GLARGINE-YFGN 100 UNIT/ML ~~LOC~~ SOLN: 52.0000 [IU] | Freq: Two times a day (BID) | SUBCUTANEOUS | Status: DC

## 2021-06-14 MED ORDER — DEXAMETHASONE 4 MG PO TABS
6.0000 mg | ORAL_TABLET | Freq: Two times a day (BID) | ORAL | Status: DC
  Administered 2021-06-14 – 2021-06-17 (×7): 6 mg via ORAL
  Filled 2021-06-14 (×7): qty 2

## 2021-06-14 MED ORDER — INSULIN GLARGINE-YFGN 100 UNIT/ML ~~LOC~~ SOLN
54.0000 [IU] | Freq: Two times a day (BID) | SUBCUTANEOUS | Status: DC
  Administered 2021-06-14 – 2021-06-17 (×6): 54 [IU] via SUBCUTANEOUS
  Filled 2021-06-14 (×9): qty 0.54

## 2021-06-14 NOTE — Progress Notes (Signed)
NAME:  Dylan Pittman, MRN:  [id number], DOB:  [birth year], LOS: 78 ADMISSION DATE:  05/30/2021, CONSULTATION DATE:  05/30/21 REFERRING MD:  Dr. Creig Hines, CHIEF COMPLAINT:   Shortness of breath  History of Present Illness:  70 year old male presenting to [facility] ED from home on 05/30/2021 with complaints of progressive hypoxia at rest/with exertion and dyspnea with exertion.  Of note patient was recently hospitalized with COVID-19 pneumonia from 05/09/2021 to 05/13/2021.  He describes that since discharge he now wears continuous oxygen via nasal cannula.  He started at 2 L at rest and 4 L with ambulation, but reports he has had to increase to 5 L nasal cannula at rest which maintains his SPO2 greater than 90%.  He reports with any exertion his SPO2 drops into the 50s to 60%.  Earlier today he took a shower with his wife's assistance his oxygen dropped to 58% and it took 45 minutes for him to recover above 90% with his nasal cannula.  He does admit to feeling dizzy today correlating with his hypoxia, but denies any recent falls or injuries. He does have a CPAP machine to wear at night for his OSA, but admits that since discharge there was equipment difficulties and he has not been able to use it consistently.  He complains of persistent coughing spells with mostly clear phlegm, however earlier today he did notice some red streaks in his sputum.  He denies chest pain, nausea/vomiting/diarrhea/abdominal pain, fever/chills, or sore throat.      Pertinent  Medical History  Pulmonary fibrosis Hypertension Hyperlipidemia Type 2 diabetes Obstructive sleep apnea COVID-19 (September 2022) Prostate cancer status post seed ablation and Lupron injections BPH GERD Bronchitis Significant Hospital Events: Including procedures, antibiotic start and stop dates in addition to other pertinent events     06/13/21- patient vomited post dinner yesterday but denies aspiration.  He felt GERD symptoms and indigestion.  I  have upgraded protonix to BID which is what he takes at home.   06/14/21- patient feels improved, he is able to speak longer without desaturation. Plan to reduce dexamethasone to 6 bid.  I spoke to [facility] for possible short term stay while weaning of HFNC  Objective   Blood pressure 125/83, pulse 91, temperature 97.8 F (36.6 C), temperature source Oral, resp. rate 20, height 5\' 8"  (1.727 m), weight 101.1 kg, SpO2 92 %.        Intake/Output Summary (Last 24 hours) at 06/14/2021 1062 Last data filed at 06/14/2021 0600 Gross per 24 hour  Intake 720 ml  Output 1500 ml  Net -780 ml    Filed Weights   06/05/21 0500 06/06/21 0258 06/07/21 0500  Weight: 98.6 kg 100.6 kg 101.1 kg    Examination: General: Adult male, acutely ill, lying in bed, NAD HEENT: MM pink/moist, anicteric, atraumatic, neck supple Neuro: A&O x 4, able to follow commands, PERRL +3, MAE CV: s1s2 RRR, sinus tachycardia on monitor, no r/m/g Pulm: Rmild rhonchi bilaterally  breath sounds coarse/diminished-BUL & diminished-BLL GI: soft, rounded, non tender, bs x 4 Skin: Limited exam-patient still in street clothes> no rashes/lesions noted Extremities: warm/dry, pulses + 2 R/P, trace edema noted BLE  Resolved Hospital Problem list     Assessment & Plan:  Acute Hypoxic Respiratory Failure       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  - dcd home Vitamin C & Zinc -increased protonix to bid Type 2 Diabetes Mellitus At Risk for Steroid Induced Hyperglycemia - Monitor CBG AC, HS - SSI moderate dosing,  continue home long-acting coverage (solostar 40 units daily) >> Lantus 20 units BID - target range while in ICU: 140-180 - follow ICU hyper/hypo-glycemia protocol - Carb-modified diet in place -contiue dexamethasone at current dose   BPH - continue home flomax - strict I & O's, monitor for urinary retention  Hyperlipidemia  Hypertension - continue home rosuvastatin - due to SIRS response to suspected Pneumonia with marginal-normal BP, will hold outpatient anti-hypertensives. Consider restarting: Losartan as patient stabilizes - PRN hydralazine if SBP > 160  Best Practice (right click and "Reselect all SmartList Selections" daily)  Diet/type: Regular consistency (see orders) DVT prophylaxis: LMWH GI prophylaxis: PPI Lines: N/A Foley:  N/A Code Status:  full code Last date of multidisciplinary goals of care discussion [05/30/2021]  IMAGING   Recent Labs  Lab 06/09/21 0517 06/12/21 0435  WBC 12.3* 10.4  HGB 12.6* 12.6*  HCT 37.5* 38.5*  MCV 94.9 93.7  PLT 223 215     Basic Metabolic Panel: Recent Labs  Lab 06/09/21 0517 06/12/21 0435 06/13/21 0747 06/14/21 0443  NA 135 140 138 136  K 4.0 4.4 5.1 4.4  CL 92* 98 93* 95*  CO2 36* 35* 35* 32  GLUCOSE 186* 193* 192* 244*  BUN 16 18 20 20   CREATININE 0.59* 0.49* 0.76 0.77  CALCIUM 8.2* 8.9 9.5 8.5*  MG  --   --  2.2  --   PHOS  --   --  5.8*  --     GFR: Estimated Creatinine Clearance: 101.9 mL/min (by C-G formula based on SCr of 0.77 mg/dL). Recent Labs  Lab 06/09/21 0517 06/12/21 0435  WBC 12.3* 10.4     Liver Function Tests: No results for input(s): AST, ALT, ALKPHOS, BILITOT, PROT, ALBUMIN in the last 168 hours.  No results for input(s): LIPASE, AMYLASE in the last 168 hours. No results for input(s): AMMONIA in the last 168 hours.  ABG No results found for: PHART, PCO2ART, PO2ART, HCO3, TCO2, ACIDBASEDEF, O2SAT   Coagulation Profile: No results for input(s): INR, PROTIME in the last 168  hours.  Cardiac Enzymes: No results for input(s): CKTOTAL, CKMB, CKMBINDEX, TROPONINI in the last 168 hours.  HbA1C: Hgb A1c MFr Bld  Date/Time Value Ref Range Status  05/09/2021 06:18 AM 8.6 (H) 4.8 - 5.6 % Final    Comment:    (NOTE) Pre diabetes:          5.7%-6.4%  Diabetes:              >6.4%  Glycemic control for   <7.0% adults with diabetes     CBG: Recent Labs  Lab 06/13/21 0735 06/13/21 1243 06/13/21 1703 06/13/21 2053 06/14/21 0813  GLUCAP 199* 326* 211* 177* 174*     Review of Systems: Positives in BOLD   Gen: Denies fever, chills, weight change, fatigue, night sweats HEENT: Denies blurred vision, double vision, hearing loss, tinnitus, sinus congestion, rhinorrhea, sore throat, neck stiffness, dysphagia PULM: Denies shortness of breath, cough, sputum production,  hemoptysis, wheezing CV: Denies chest pain, edema, orthopnea, paroxysmal nocturnal dyspnea, palpitations GI: Denies abdominal pain, nausea, vomiting, diarrhea, hematochezia, melena, constipation, change in bowel habits GU: Denies dysuria, hematuria, polyuria, oliguria, urethral discharge Endocrine: Denies hot or cold intolerance, polyuria, polyphagia or appetite change Derm: Denies rash, dry skin, scaling or peeling skin change Heme: Denies easy bruising, bleeding, bleeding gums Neuro: Denies headache, numbness, weakness, slurred speech, loss of memory or consciousness  Past Medical History:  He,  has a past medical history of Chronic airway obstruction ([place]), DDD (degenerative disc disease), lumbar, Dupuytren's disease, Dyslipidemia (04/07/2014), Esophageal reflux (04/20/2014), Hypertension (04/20/2014), Microalbuminuria (04/07/2014), Microalbuminuria, Obesity, unspecified (04/07/2014), Sleep apnea (04/20/2014), and 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   Surgical History:   Past Surgical History:  Procedure Laterality Date   BACK SURGERY  1992, 2005   CLOSED REDUCTION  FINGER WITH PERCUTANEOUS PINNING Left 02/12/2020   Procedure: Closed reduction and pinning of left first metacarpal fracture with possible open reduction;  Surgeon: Hessie Knows, MD;  Location: [facility] ORS;  Service: Orthopedics;  Laterality: Left;   COLONOSCOPY WITH PROPOFOL N/A 12/28/2017   Procedure: COLONOSCOPY WITH PROPOFOL;  Surgeon: Manya Silvas, MD;  Location: [facility] ENDOSCOPY;  Service: Endoscopy;  Laterality: N/A;   NASAL SEPTUM SURGERY     RADIOACTIVE SEED IMPLANT N/A 04/12/2021   Procedure: RADIOACTIVE SEED IMPLANT/BRACHYTHERAPY IMPLANT;  Surgeon: Abbie Sons, MD;  Location: [facility] ORS;  Service: Urology;  Laterality: N/A;  73 seeds implanted   VASECTOMY  2005     Social History:   reports that he has quit smoking. He has never used smokeless tobacco. He reports that he does not currently use alcohol. He reports that he does not currently use drugs.   Family History:  His family history is negative for Prostate cancer and Kidney cancer.   Allergies No Known Allergies   Home Medications  Prior to Admission medications   Medication Sig Start Date End Date Taking? Authorizing Provider  albuterol (VENTOLIN HFA) 108 (90 Base) MCG/ACT inhaler Inhale 2 puffs into the lungs every 6 (six) hours as needed for wheezing or shortness of breath. 01/20/20  Yes [provider]  ascorbic acid (VITAMIN C) 1000 MG tablet Take 0.5 tablets (500 mg total) by mouth daily. 05/14/21  Yes Wieting, Richard, MD  cholecalciferol (VITAMIN D) 25 MCG (1000 UNIT) tablet Take 1,000 Units by mouth in the morning.   Yes [provider]  fluticasone-salmeterol (ADVAIR HFA) 115-21 MCG/ACT inhaler Inhale 2 puffs into the lungs 2 (two) times daily.   Yes [provider]  furosemide (LASIX) 20 MG tablet Take 20 mg by mouth daily. 05/26/21  Yes [provider]  gabapentin (NEURONTIN) 300 MG capsule Take 300 mg by mouth 3 (three) times daily. 05/26/21  Yes [provider]   insulin aspart (NOVOLOG) 100 UNIT/ML FlexPen Inject 8 Units into the skin 3 (three) times daily with meals. Okay to substitute generic 05/13/21  Yes Wieting, Richard, MD  losartan (COZAAR) 50 MG tablet Take 50 mg by mouth in the morning. 10/07/17  Yes [provider]  OZEMPIC, 1 MG/DOSE, 4 MG/3ML SOPN Inject 1 mg into the skin every Sunday. 07/29/19  Yes [provider]  pantoprazole (PROTONIX) 40 MG tablet Take 40 mg by mouth 2 (two) times daily. 10/19/17  Yes [provider]  pioglitazone (ACTOS) 30 MG tablet Take 30 mg by mouth in the morning. 07/27/19  Yes [provider]  rosuvastatin (CRESTOR) 5 MG tablet Take  5 mg by mouth every Monday, Wednesday, and Friday. In the morning 04/19/18  Yes [provider]  tamsulosin (FLOMAX) 0.4 MG CAPS capsule Take 1 capsule (0.4 mg total) by mouth daily. 03/25/21  Yes Vaillancourt, Samantha, PA-C  TOUJEO MAX SOLOSTAR 300 UNIT/ML Solostar Pen Inject 50 Units into the skin in the morning. Patient taking differently: Inject 40 Units into the skin in the morning. 05/13/21  Yes Wieting, Richard, MD  XIGDUO XR 12-998 MG TB24 Take 2 tablets by mouth every morning. 01/31/21  Yes [provider]  zinc sulfate 220 (50 Zn) MG capsule Take 1 capsule (220 mg total) by mouth daily. 05/14/21  Yes Wieting, Richard, MD  aspirin EC 81 MG tablet Take 81 mg by mouth in the morning. Swallow whole. Patient not taking: Reported on 05/30/2021    [provider]  chlorpheniramine-HYDROcodone (TUSSIONEX) 10-8 MG/5ML SUER Take 5 mLs by mouth every 12 (twelve) hours as needed for cough. Patient not taking: No sig reported 05/13/21   Loletha Grayer, MD  feeding supplement, GLUCERNA SHAKE, (GLUCERNA SHAKE) LIQD Take 237 mLs by mouth 2 (two) times daily between meals. 05/13/21   Loletha Grayer, MD  fluticasone-salmeterol (ADVAIR HFA) 001-74 MCG/ACT inhaler Inhale 2 puffs into the lungs 2 (two) times daily. Rinse mouth out with water  after use Patient not taking: No sig reported 05/13/21   Loletha Grayer, MD  Insulin Pen Needle 33G X 4 MM MISC 1 Dose by Does not apply route 3 (three) times daily before meals. 05/13/21   Loletha Grayer, MD  predniSONE (DELTASONE) 10 MG tablet Three tabs po daily for five days Patient not taking: No sig reported 05/13/21   Loletha Grayer, MD       Ottie Glazier, M.D.  Pulmonary & Critical Care Medicine

## 2021-06-14 NOTE — Progress Notes (Addendum)
JARREN PARA  TMH:[id number] DOB: [birth year] DOA: 05/30/2021 PCP: Sofie Hartigan, MD    Brief Narrative:  70 year old with a history of pulmonary fibrosis, HTN, HLD, DM2, obstructive sleep apnea, prostate cancer status post seed ablation and Lupron, BPH, and GERD who was admitted after presenting to the [facility] ED from home 10/3 with progressive hypoxia at rest and severe dyspnea on exertion.  The patient had been hospitalized with COVID pneumonia 9/12 >05/13/2021.  He stated that his dyspnea only worsened after being discharged home with increasing oxygen support requirements each day.  He noted his saturations dropping into the 50-60% range with exertion.  Significant Events:  10/3 admit via ED - required [place] 10/6 weaning of [place]  10/8 weaned to nasal cannula at 10 L [place]  10/12 weaned to 7L, symptomatically improving  Consultants:  PCCM  Code Status: FULL CODE  Antimicrobials:  Azithromycin 10/5 >  Cefepime 10/3 > 10/4 Vancomycin 10/3 > 10/5  DVT prophylaxis: Lovenox  Subjective: No new events reported overnight.  Remains dyspneic with exertion, this is improving. Otherwise feels well  Assessment & Plan:  Acute on chronic hypoxic respiratory failure - Post CoViD ILD / Pulm Fibrosis - ILD w/ acute exacerbation - possible Legionella  Care per Pulmonary -weaning oxygen as able - pulm weaning steroids, now transitioned to oral dexamethasone. Slowly weaning o2, down to 5 L today. Is s/p 10 days azith for possible legionella. Continuing pulm toilet, inhalers. Bactrim started for pneumocystis ppx. Pulm thinks could be weeks until stable enough to return home, TOC is exploring ltac options. Have ordered ativan prn for anxiety. Subjectively improved today.  DM2 CBG moderate elevations fasting, pre-prandial wnl. Increase semglee from 46 to 54 bid, incr mealtime from 10 to 12, continue ssi  Anal fissure v/s small hemorrhoid - scant BRBPR resolved Keep lubricated w/ vaseline or  neosporin - trial of anusol suppository - keep stools loose - monitor   BPH Continue usual home medical therapy  HLD Continue usual home medical therapy  HTN Wnl, home losartan on hold  Family Communication: wife updated @ bedside 10/18  Remains inpatient appropriate because:Inpatient level of care appropriate due to severity of illness  Dispo: The patient is from: Home              Anticipated d/c is to: Home w/ home health PT vs ltac vs other?              Patient currently is not medically stable to d/c.   Difficult to place patient No  Objective: Blood pressure (!) 128/95, pulse (!) 106, temperature 98.7 F (37.1 C), resp. rate 20, height 5\' 8"  (1.727 m), weight 101.1 kg, SpO2 95 %.  Intake/Output Summary (Last 24 hours) at 06/14/2021 1414 Last data filed at 06/14/2021 1357 Gross per 24 hour  Intake 240 ml  Output 2100 ml  Net -1860 ml   Filed Weights   06/05/21 0500 06/06/21 0258 06/07/21 0500  Weight: 98.6 kg 100.6 kg 101.1 kg    Examination: General: No acute respiratory distress  Lungs: no wheezing, faint rales throughout Cardiovascular: Regular rate and rhythm without murmur  Abdomen: Nontender, nondistended, soft, bowel sounds positive, no rebound Extremities: Trace bilateral lower extremity edema  CBC: Recent Labs  Lab 06/09/21 0517 06/12/21 0435  WBC 12.3* 10.4  HGB 12.6* 12.6*  HCT 37.5* 38.5*  MCV 94.9 93.7  PLT 223 921   Basic Metabolic Panel: Recent Labs  Lab 06/12/21 0435 06/13/21 0747 06/14/21 0443  NA 140 138 136  K 4.4 5.1 4.4  CL 98 93* 95*  CO2 35* 35* 32  GLUCOSE 193* 192* 244*  BUN 18 20 20   CREATININE 0.49* 0.76 0.77  CALCIUM 8.9 9.5 8.5*  MG  --  2.2  --   PHOS  --  5.8*  --    GFR: Estimated Creatinine Clearance: 101.9 mL/min (by C-G formula based on SCr of 0.77 mg/dL).  Liver Function Tests: No results for input(s): AST, ALT, ALKPHOS, BILITOT, PROT, ALBUMIN in the last 168 hours.   HbA1C: Hgb A1c MFr Bld   Date/Time Value Ref Range Status  05/09/2021 06:18 AM 8.6 (H) 4.8 - 5.6 % Final    Comment:    (NOTE) Pre diabetes:          5.7%-6.4%  Diabetes:              >6.4%  Glycemic control for   <7.0% adults with diabetes     CBG: Recent Labs  Lab 06/13/21 1243 06/13/21 1703 06/13/21 2053 06/14/21 0813 06/14/21 1219  GLUCAP 326* 211* 177* 174* 251*    No results found for this or any previous visit (from the past 240 hour(s)).    Scheduled Meds:  dexamethasone  6 mg Oral Q12H   enoxaparin (LOVENOX) injection  0.5 mg/kg Subcutaneous Q24H   fluticasone furoate-vilanterol  1 puff Inhalation Daily   furosemide  20 mg Oral Daily   insulin aspart  0-20 Units Subcutaneous TID AC & HS   insulin aspart  10 Units Subcutaneous TID WC   insulin glargine-yfgn  46 Units Subcutaneous BID   ipratropium-albuterol  3 mL Nebulization Q6H   neomycin-bacitracin-polymyxin   Topical BID   pantoprazole  40 mg Oral BID AC   rosuvastatin  5 mg Oral Q M,W,F   sulfamethoxazole-trimethoprim  1 tablet Oral Once per day on Mon Wed Fri   tamsulosin  0.4 mg Oral Daily      LOS: 15 days   Laurey Arrow, MD   If 7PM-7AM, please contact night-coverage per Amion 06/14/2021, 2:14 PM

## 2021-06-15 DIAGNOSIS — J849 Interstitial pulmonary disease, unspecified: Secondary | ICD-10-CM

## 2021-06-15 DIAGNOSIS — G4730 Sleep apnea, unspecified: Secondary | ICD-10-CM

## 2021-06-15 DIAGNOSIS — E1169 Type 2 diabetes mellitus with other specified complication: Secondary | ICD-10-CM

## 2021-06-15 DIAGNOSIS — N400 Enlarged prostate without lower urinary tract symptoms: Secondary | ICD-10-CM

## 2021-06-15 DIAGNOSIS — I10 Essential (primary) hypertension: Secondary | ICD-10-CM

## 2021-06-15 DIAGNOSIS — E785 Hyperlipidemia, unspecified: Secondary | ICD-10-CM

## 2021-06-15 LAB — GLUCOSE, CAPILLARY
Glucose-Capillary: 155 mg/dL — ABNORMAL HIGH (ref 70–99)
Glucose-Capillary: 131 mg/dL — ABNORMAL HIGH (ref 70–99)
Glucose-Capillary: 142 mg/dL — ABNORMAL HIGH (ref 70–99)
Glucose-Capillary: 138 mg/dL — ABNORMAL HIGH (ref 70–99)

## 2021-06-15 LAB — CBC WITH DIFFERENTIAL/PLATELET
Abs Immature Granulocytes: 0.15 10*3/uL — ABNORMAL HIGH (ref 0.00–0.07)
Basophils Absolute: 0 10*3/uL (ref 0.0–0.1)
Basophils Relative: 0 %
Eosinophils Absolute: 0 10*3/uL (ref 0.0–0.5)
Eosinophils Relative: 0 %
HCT: 38.9 % — ABNORMAL LOW (ref 39.0–52.0)
Hemoglobin: 13.1 g/dL (ref 13.0–17.0)
Immature Granulocytes: 1 %
Lymphocytes Relative: 7 %
Lymphs Abs: 0.8 10*3/uL (ref 0.7–4.0)
MCH: 31.5 pg (ref 26.0–34.0)
MCHC: 33.7 g/dL (ref 30.0–36.0)
MCV: 93.5 fL (ref 80.0–100.0)
Monocytes Absolute: 0.5 10*3/uL (ref 0.1–1.0)
Monocytes Relative: 4 %
Neutro Abs: 10.2 10*3/uL — ABNORMAL HIGH (ref 1.7–7.7)
Neutrophils Relative %: 88 %
Platelets: 249 10*3/uL (ref 150–400)
RBC: 4.16 MIL/uL — ABNORMAL LOW (ref 4.22–5.81)
RDW: 13.3 % (ref 11.5–15.5)
WBC: 11.7 10*3/uL — ABNORMAL HIGH (ref 4.0–10.5)
nRBC: 0 % (ref 0.0–0.2)

## 2021-06-15 LAB — BASIC METABOLIC PANEL
Anion gap: 8 (ref 5–15)
BUN: 23 mg/dL (ref 8–23)
CO2: 30 mmol/L (ref 22–32)
Calcium: 8.3 mg/dL — ABNORMAL LOW (ref 8.9–10.3)
Chloride: 95 mmol/L — ABNORMAL LOW (ref 98–111)
Creatinine, Ser: 0.65 mg/dL (ref 0.61–1.24)
GFR, Estimated: >60 mL/min (ref >60)
Glucose, Bld: 217 mg/dL — ABNORMAL HIGH (ref 70–99)
Potassium: 4.2 mmol/L (ref 3.5–5.1)
Sodium: 133 mmol/L — ABNORMAL LOW (ref 135–145)

## 2021-06-15 LAB — PHOSPHORUS: Phosphorus: 5.1 mg/dL — ABNORMAL HIGH (ref 2.5–4.6)

## 2021-06-15 LAB — HEPATIC FUNCTION PANEL
ALT: 24 U/L (ref 0–44)
AST: 22 U/L (ref 15–41)
Albumin: 3.1 g/dL — ABNORMAL LOW (ref 3.5–5.0)
Alkaline Phosphatase: 81 U/L (ref 38–126)
Bilirubin, Direct: <0.1 mg/dL (ref 0.0–0.2)
Total Bilirubin: 0.6 mg/dL (ref 0.3–1.2)
Total Protein: 6.5 g/dL (ref 6.5–8.1)

## 2021-06-15 LAB — MAGNESIUM: Magnesium: 2.2 mg/dL (ref 1.7–2.4)

## 2021-06-15 LAB — C-REACTIVE PROTEIN: CRP: 0.7 mg/dL (ref <1.0)

## 2021-06-15 MED ORDER — GUAIFENESIN-DM 100-10 MG/5ML PO SYRP
5.0000 mL | ORAL_SOLUTION | ORAL | Status: DC | PRN
  Administered 2021-06-15 – 2021-06-17 (×4): 5 mL via ORAL
  Filled 2021-06-15 (×4): qty 5

## 2021-06-15 NOTE — Progress Notes (Signed)
PT Cancellation Note  Patient Details Name: Dylan Pittman MRN: [id number] DOB: 12/31/1950   Cancelled Treatment:     Therapist in to see pt this pm, pt had just finished ambulating 8 laps while self monitoring O2 sats on 8L O2.  Pt to continue again this pm. Will reassess next availability.    [name] 06/15/2021, 1:29 PM

## 2021-06-15 NOTE — Progress Notes (Signed)
NAME:  Dylan Pittman, MRN:  [id number], DOB:  21-Oct-1950, LOS: 1 ADMISSION DATE:  05/30/2021, CONSULTATION DATE:  05/30/21 REFERRING MD:  Dr. Creig Hines, CHIEF COMPLAINT:   Shortness of breath  History of Present Illness:  70 year old male presenting to [facility] ED from home on 05/30/2021 with complaints of progressive hypoxia at rest/with exertion and dyspnea with exertion.  Of note patient was recently hospitalized with COVID-19 pneumonia from 05/09/2021 to 05/13/2021.  He describes that since discharge he now wears continuous oxygen via nasal cannula.  He started at 2 L at rest and 4 L with ambulation, but reports he has had to increase to 5 L nasal cannula at rest which maintains his SPO2 greater than 90%.  He reports with any exertion his SPO2 drops into the 50s to 60%.  Earlier today he took a shower with his wife's assistance his oxygen dropped to 58% and it took 45 minutes for him to recover above 90% with his nasal cannula.  He does admit to feeling dizzy today correlating with his hypoxia, but denies any recent falls or injuries. He does have a CPAP machine to wear at night for his OSA, but admits that since discharge there was equipment difficulties and he has not been able to use it consistently.  He complains of persistent coughing spells with mostly clear phlegm, however earlier today he did notice some red streaks in his sputum.  He denies chest pain, nausea/vomiting/diarrhea/abdominal pain, fever/chills, or sore throat.      Pertinent  Medical History  Pulmonary fibrosis Hypertension Hyperlipidemia Type 2 diabetes Obstructive sleep apnea COVID-19 (September 2022) Prostate cancer status post seed ablation and Lupron injections BPH GERD Bronchitis Significant Hospital Events: Including procedures, antibiotic start and stop dates in addition to other pertinent events     06/13/21- patient vomited post dinner yesterday but denies aspiration.  He felt GERD symptoms and indigestion.  I  have upgraded protonix to BID which is what he takes at home.   06/14/21- patient feels improved, he is able to speak longer without desaturation. Plan to reduce dexamethasone to 6 bid.  I spoke to [facility] for possible short term stay while weaning of HFNC 06/15/21- patient slowly improving but seems to still be struggling with labored breathing.  Discussed LTACH with patient.  He has uncontrolled GERD  Objective   Blood pressure 112/61, pulse 71, temperature 98.8 F (37.1 C), temperature source Oral, resp. rate (!) 22, height 5\' 8"  (1.727 m), weight 101.1 kg, SpO2 90 %.        Intake/Output Summary (Last 24 hours) at 06/15/2021 1034 Last data filed at 06/15/2021 1031 Gross per 24 hour  Intake 240 ml  Output 625 ml  Net -385 ml    Filed Weights   06/05/21 0500 06/06/21 0258 06/07/21 0500  Weight: 98.6 kg 100.6 kg 101.1 kg    Examination: General: Adult male, acutely ill, lying in bed, NAD HEENT: MM pink/moist, anicteric, atraumatic, neck supple Neuro: A&O x 4, able to follow commands, PERRL +3, MAE CV: s1s2 RRR, sinus tachycardia on monitor, no r/m/g Pulm: Rmild rhonchi bilaterally  breath sounds coarse/diminished-BUL & diminished-BLL GI: soft, rounded, non tender, bs x 4 Skin: Limited exam-patient still in street clothes> no rashes/lesions noted Extremities: warm/dry, pulses + 2 R/P, trace edema noted BLE  Resolved Hospital Problem list     Assessment & Plan:  Acute Hypoxic Respiratory Failure       Post COVID ILD with pulmonary fibrosis and acute exacerbation  of ILD with multifocal pneumonia           -Legionella Ab + , UG neg, DFA and legionella culture negative  - Continue HHFNC, wean FiO2 as tolerated > may need to transition to BIPAP overnight d/t OSA - Supplemental O2 to maintain SpO2 > 88% - Intermittent chest x-ray & ABG PRN - Ensure adequate pulmonary hygiene: IS Q 1 h x 10 while awake - changing to dexamethasone 6 bid then tapering.  - dcd home Vitamin  C & Zinc -increased protonix to bid Type 2 Diabetes Mellitus At Risk for Steroid Induced Hyperglycemia - Monitor CBG AC, HS - SSI moderate dosing, continue home long-acting coverage (solostar 40 units daily) >> Lantus 20 units BID - target range while in ICU: 140-180 - follow ICU hyper/hypo-glycemia protocol - Carb-modified diet in place -contiue dexamethasone at current dose   BPH - continue home flomax - strict I & O's, monitor for urinary retention  Hyperlipidemia  Hypertension - continue home rosuvastatin - due to SIRS response to suspected Pneumonia with marginal-normal BP, will hold outpatient anti-hypertensives. Consider restarting: Losartan as patient stabilizes - PRN hydralazine if SBP > 160  Best Practice (right click and "Reselect all SmartList Selections" daily)  Diet/type: Regular consistency (see orders) DVT prophylaxis: LMWH GI prophylaxis: PPI Lines: N/A Foley:  N/A Code Status:  full code Last date of multidisciplinary goals of care discussion [05/30/2021]  IMAGING   Recent Labs  Lab 06/09/21 0517 06/12/21 0435 06/15/21 0446  WBC 12.3* 10.4 11.7*  NEUTROABS  --   --  10.2*  HGB 12.6* 12.6* 13.1  HCT 37.5* 38.5* 38.9*  MCV 94.9 93.7 93.5  PLT 223 215 249     Basic Metabolic Panel: Recent Labs  Lab 06/09/21 0517 06/12/21 0435 06/13/21 0747 06/14/21 0443 06/15/21 0446  NA 135 140 138 136 133*  K 4.0 4.4 5.1 4.4 4.2  CL 92* 98 93* 95* 95*  CO2 36* 35* 35* 32 30  GLUCOSE 186* 193* 192* 244* 217*  BUN 16 18 20 20 23   CREATININE 0.59* 0.49* 0.76 0.77 0.65  CALCIUM 8.2* 8.9 9.5 8.5* 8.3*  MG  --   --  2.2  --   --   PHOS  --   --  5.8*  --   --     GFR: Estimated Creatinine Clearance: 101.9 mL/min (by C-G formula based on SCr of 0.65 mg/dL). Recent Labs  Lab 06/09/21 0517 06/12/21 0435 06/15/21 0446  WBC 12.3* 10.4 11.7*     Liver Function Tests: No results for input(s): AST, ALT, ALKPHOS, BILITOT, PROT, ALBUMIN in the last 168  hours.  No results for input(s): LIPASE, AMYLASE in the last 168 hours. No results for input(s): AMMONIA in the last 168 hours.  ABG No results found for: PHART, PCO2ART, PO2ART, HCO3, TCO2, ACIDBASEDEF, O2SAT   Coagulation Profile: No results for input(s): INR, PROTIME in the last 168 hours.  Cardiac Enzymes: No results for input(s): CKTOTAL, CKMB, CKMBINDEX, TROPONINI in the last 168 hours.  HbA1C: Hgb A1c MFr Bld  Date/Time Value Ref Range Status  05/09/2021 06:18 AM 8.6 (H) 4.8 - 5.6 % Final    Comment:    (NOTE) Pre diabetes:          5.7%-6.4%  Diabetes:              >6.4%  Glycemic control for   <7.0% adults with diabetes     CBG: Recent Labs  Lab 06/14/21 0813 06/14/21 1219  06/14/21 1628 06/14/21 2045 06/15/21 0802  GLUCAP 174* 251* 126* 139* 155*     Review of Systems: Positives in BOLD   Gen: Denies fever, chills, weight change, fatigue, night sweats HEENT: Denies blurred vision, double vision, hearing loss, tinnitus, sinus congestion, rhinorrhea, sore throat, neck stiffness, dysphagia PULM: Denies shortness of breath, cough, sputum production, hemoptysis, wheezing CV: Denies chest pain, edema, orthopnea, paroxysmal nocturnal dyspnea, palpitations GI: Denies abdominal pain, nausea, vomiting, diarrhea, hematochezia, melena, constipation, change in bowel habits GU: Denies dysuria, hematuria, polyuria, oliguria, urethral discharge Endocrine: Denies hot or cold intolerance, polyuria, polyphagia or appetite change Derm: Denies rash, dry skin, scaling or peeling skin change Heme: Denies easy bruising, bleeding, bleeding gums Neuro: Denies headache, numbness, weakness, slurred speech, loss of memory or consciousness  Past Medical History:  He,  has a past medical history of Chronic airway obstruction (HCC), DDD (degenerative disc disease), lumbar, Dupuytren's disease, Dyslipidemia (04/07/2014), Esophageal reflux (04/20/2014), Hypertension (04/20/2014),  Microalbuminuria (04/07/2014), Microalbuminuria, Obesity, unspecified (04/07/2014), Sleep apnea (04/20/2014), and 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   Surgical History:   Past Surgical History:  Procedure Laterality Date   BACK SURGERY  1992, 2005   CLOSED REDUCTION FINGER WITH PERCUTANEOUS PINNING Left 02/12/2020   Procedure: Closed reduction and pinning of left first metacarpal fracture with possible open reduction;  Surgeon: Hessie Knows, MD;  Location: [facility] ORS;  Service: Orthopedics;  Laterality: Left;   COLONOSCOPY WITH PROPOFOL N/A 12/28/2017   Procedure: COLONOSCOPY WITH PROPOFOL;  Surgeon: Manya Silvas, MD;  Location: [facility] ENDOSCOPY;  Service: Endoscopy;  Laterality: N/A;   NASAL SEPTUM SURGERY     RADIOACTIVE SEED IMPLANT N/A 04/12/2021   Procedure: RADIOACTIVE SEED IMPLANT/BRACHYTHERAPY IMPLANT;  Surgeon: Abbie Sons, MD;  Location: [facility] ORS;  Service: Urology;  Laterality: N/A;  73 seeds implanted   VASECTOMY  2005     Social History:   reports that he has quit smoking. He has never used smokeless tobacco. He reports that he does not currently use alcohol. He reports that he does not currently use drugs.   Family History:  His family history is negative for Prostate cancer and Kidney cancer.   Allergies No Known Allergies   Home Medications  Prior to Admission medications   Medication Sig Start Date End Date Taking? Authorizing Provider  albuterol (VENTOLIN HFA) 108 (90 Base) MCG/ACT inhaler Inhale 2 puffs into the lungs every 6 (six) hours as needed for wheezing or shortness of breath. 01/20/20  Yes [provider]  ascorbic acid (VITAMIN C) 1000 MG tablet Take 0.5 tablets (500 mg total) by mouth daily. 05/14/21  Yes Wieting, Richard, MD  cholecalciferol (VITAMIN D) 25 MCG (1000 UNIT) tablet Take 1,000 Units by mouth in the morning.   Yes [provider]  fluticasone-salmeterol (ADVAIR HFA) 115-21 MCG/ACT inhaler  Inhale 2 puffs into the lungs 2 (two) times daily.   Yes [provider]  furosemide (LASIX) 20 MG tablet Take 20 mg by mouth daily. 05/26/21  Yes [provider]  gabapentin (NEURONTIN) 300 MG capsule Take 300 mg by mouth 3 (three) times daily. 05/26/21  Yes [provider]  insulin aspart (NOVOLOG) 100 UNIT/ML FlexPen Inject 8 Units into the skin 3 (three) times daily with meals. Okay to substitute generic 05/13/21  Yes Wieting, Richard, MD  losartan (COZAAR) 50 MG tablet Take 50 mg by mouth in the morning. 10/07/17  Yes [provider]  OZEMPIC, 1 MG/DOSE, 4 MG/3ML SOPN Inject 1 mg  into the skin every Sunday. 07/29/19  Yes [provider]  pantoprazole (PROTONIX) 40 MG tablet Take 40 mg by mouth 2 (two) times daily. 10/19/17  Yes [provider]  pioglitazone (ACTOS) 30 MG tablet Take 30 mg by mouth in the morning. 07/27/19  Yes [provider]  rosuvastatin (CRESTOR) 5 MG tablet Take 5 mg by mouth every Monday, Wednesday, and Friday. In the morning 04/19/18  Yes [provider]  tamsulosin (FLOMAX) 0.4 MG CAPS capsule Take 1 capsule (0.4 mg total) by mouth daily. 03/25/21  Yes Vaillancourt, Samantha, PA-C  TOUJEO MAX SOLOSTAR 300 UNIT/ML Solostar Pen Inject 50 Units into the skin in the morning. Patient taking differently: Inject 40 Units into the skin in the morning. 05/13/21  Yes Wieting, Richard, MD  XIGDUO XR 12-998 MG TB24 Take 2 tablets by mouth every morning. 01/31/21  Yes [provider]  zinc sulfate 220 (50 Zn) MG capsule Take 1 capsule (220 mg total) by mouth daily. 05/14/21  Yes Wieting, Richard, MD  aspirin EC 81 MG tablet Take 81 mg by mouth in the morning. Swallow whole. Patient not taking: Reported on 05/30/2021    [provider]  chlorpheniramine-HYDROcodone (TUSSIONEX) 10-8 MG/5ML SUER Take 5 mLs by mouth every 12 (twelve) hours as needed for cough. Patient not taking: No sig reported 05/13/21    Loletha Grayer, MD  feeding supplement, GLUCERNA SHAKE, (GLUCERNA SHAKE) LIQD Take 237 mLs by mouth 2 (two) times daily between meals. 05/13/21   Loletha Grayer, MD  fluticasone-salmeterol (ADVAIR HFA) 865-78 MCG/ACT inhaler Inhale 2 puffs into the lungs 2 (two) times daily. Rinse mouth out with water after use Patient not taking: No sig reported 05/13/21   Loletha Grayer, MD  Insulin Pen Needle 33G X 4 MM MISC 1 Dose by Does not apply route 3 (three) times daily before meals. 05/13/21   Loletha Grayer, MD  predniSONE (DELTASONE) 10 MG tablet Three tabs po daily for five days Patient not taking: No sig reported 05/13/21   Loletha Grayer, MD       Ottie Glazier, M.D.  Pulmonary & Critical Care Medicine

## 2021-06-15 NOTE — Progress Notes (Signed)
PROGRESS NOTE    Dylan Pittman  YIR:[id number] DOB: 01-15-1951 DOA: 05/30/2021 PCP: Sofie Hartigan, MD   Brief Narrative:  The patient is a 70 year old Caucasian male with a past medical history significant for but limited to pulmonary fibrosis, hypertension, hyperlipidemia, diabetes mellitus type 2, obstructive sleep apnea, history of prostate cancer status post seed ablation and Lupron, BPH, GERD as well as other comorbidities who presented to [facility] ED from home on 05/30/2021 with progressive hypoxia at rest and severe dyspnea on exertion.  Patient was recently hospitalized with COVID-pneumonia on 05/09/2021 and discharged on 05/14/2019.  He states that his dyspnea only worsened after being discharged home with increasing oxygen support requirements every day.  He noticed that his oxygen saturations were dropping the 50 to 60% range with exertion.  He was admitted on 05/30/2021 and required heated high flow nasal cannula and started weaning his heated high flow Nasal cannula on 06/02/2021.  On 06/04/2021 he continued to wear nasal cannula at 10 L with a [place] and then weaned to 7 L on 06/08/2021.  He is very slow to improve but he is symptomatically improving.  Assessment & Plan:   Principal Problem:   Acute on chronic respiratory failure with hypoxia (HCC) Active Problems:   Dyslipidemia   Hypertension   Sleep apnea   Type 2 diabetes mellitus with hyperlipidemia (HCC)   Benign prostatic hyperplasia without lower urinary tract symptoms   ILD (interstitial lung disease) (HCC)  Acute on chronic hypoxic respiratory failure - Post CoViD ILD / Pulm Fibrosis - ILD w/ acute exacerbation - possible Legionella  -Care per Pulmonary -weaning oxygen as able - pulm weaning steroids, now transitioned to oral dexamethasone 6 mg p.o twice daily.  -Slowly weaning o2, down to 5 L today.  -SpO2: 98 % O2 Flow Rate (L/min): 5 L/min FiO2 (%): 50 % -Is s/p 10 days azith for possible legionella.  -Continuing  pulm toilet, inhalers. Bactrim started for pneumocystis ppx.  -Pulm thinks could be weeks until stable enough to return home, TOC is exploring LTACH options.  -Pulmonary recommends continuing supplemental oxygen to maintain O2 saturation greater than 88% and continue heated high flow nasal cannula if needed and wean FiO2 as tolerated; they are considering transitioning to BiPAP overnight due to his OSA -Pulmonary recommends intermittent chest x-ray and an ABG as needed -They are also recommending ensuring pulmonary hygiene and recommending incentive spirometry every hour x10 while awake -Patient has Ativan prn for anxiety. Subjectively improved today. -C/w Furosemide 20 mg po Daily  -C/w Breo Ellipta 200-25 mcg/INH 1 puff IH and c/w DuoNeb 3 mL q6h as well Albuterol 2.5 mg Neb q2hprn Wheezing and SOB -C/w Tussionex 5 mg po q12hprn Cough and Robitussin DM 5 mg po q4hprn  -Repeat chest x-ray and continue ambulation and mobilization   DM2 with Hyperglycemia -CBG moderate elevations fasting, pre-prandial wnl.  -Increased Semglee from 46 to 54 bid and Increased mealtime Novolog Insulin from 10 to 12 -CBGs ranging from 131-251  Anal Fissure v/s small hemorrhoid  -Scant BRBPR resolved -Keep lubricated w/ vaseline or neosporin - trial of anusol suppository -  -keep stools loose - monitor    BPH -Continue usual home medical therapy with Tamsulosin 0.4 mg po Daily   HLD -Continue usual home medical therapy with Rosuvastatin 5 mg po MWF    HTN -Wnl, home losartan on hold; C/w Furosemide as above  -Continue to Monitor BP per Protocol  -Last BP was 138/70  Leukocytosis -Mild and in  the setting of Steroid Demargination -WBC went from 9.8 -> 10.2 -> 12.3 -> 10.4 -> 11.7 -Continue to Monitor and Trend  -Repeat CBC in the AM   GERD/GI Prophylaxis  -C/w Pantoprazole 40 mg po BID    DVT prophylaxis: Enoxaparin 50 mg sq q24h Code Status: FULL CODE  Family Communication: Discussed with friend  at bedside  Disposition Plan: Pending further clinical improvement and clearance by Pulmonary   Status is: Inpatient  Remains inpatient appropriate because: Increased Respiratory Effort and will need Pulmonary Clearance for a Safe Discharge Disposition as he is not currently not medically stable to D/C    Consultants:  Pulmonary  PCCM  Procedures/Significant Events:  Significant Events:  10/3 admit via ED - required [place] 10/6 weaning of [place]  10/8 weaned to nasal cannula at 10 L [place]  10/12 weaned to 7L, symptomatically improving   Antimicrobials:  Anti-infectives (From admission, onward)    Start     Dose/Rate Route Frequency Ordered Stop   06/13/21 0900  sulfamethoxazole-trimethoprim (BACTRIM DS) 800-160 MG per tablet 1 tablet        1 tablet Oral Once per day on Mon Wed Fri 06/11/21 1050     06/06/21 1115  levofloxacin (LEVAQUIN) tablet 750 mg  Status:  Discontinued        750 mg Oral Daily 06/06/21 1029 06/06/21 1120   06/06/21 1115  azithromycin (ZITHROMAX) tablet 500 mg  Status:  Discontinued        500 mg Oral Daily 06/06/21 1029 06/10/21 1245   06/04/21 1000  azithromycin (ZITHROMAX) tablet 500 mg       See Hyperspace for full Linked Orders Report.   500 mg Oral Daily 06/03/21 1636 06/05/21 0952   06/02/21 1000  azithromycin (ZITHROMAX) tablet 250 mg  Status:  Discontinued       See Hyperspace for full Linked Orders Report.   250 mg Oral Daily 06/01/21 1042 06/03/21 1636   06/01/21 1130  azithromycin (ZITHROMAX) tablet 500 mg       See Hyperspace for full Linked Orders Report.   500 mg Oral Daily 06/01/21 1042 06/01/21 1211   05/31/21 0900  vancomycin (VANCOCIN) IVPB 1000 mg/200 mL premix  Status:  Discontinued        1,000 mg 200 mL/hr over 60 Minutes Intravenous Every 12 hours 05/30/21 2155 06/01/21 1042   05/31/21 0400  ceFEPIme (MAXIPIME) 2 g in sodium chloride 0.9 % 100 mL IVPB  Status:  Discontinued        2 g 200 mL/hr over 30 Minutes Intravenous Every 8  hours 05/30/21 2155 06/01/21 1042   05/30/21 1815  vancomycin (VANCOREADY) IVPB 2000 mg/400 mL        2,000 mg 200 mL/hr over 120 Minutes Intravenous  Once 05/30/21 1807 05/30/21 2327   05/30/21 1815  ceFEPIme (MAXIPIME) 2 g in sodium chloride 0.9 % 100 mL IVPB        2 g 200 mL/hr over 30 Minutes Intravenous  Once 05/30/21 1807 05/30/21 2045        Subjective: Seen and examined at bedside and he thinks that he is doing fairly well today and just ambulated and feels better.  25 L supplemental oxygen has been weaned.  No chest pain.  No lightheadedness or dizziness.  No other concerns or complaints at this time.  Objective: Vitals:   06/15/21 0422 06/15/21 0804 06/15/21 1212 06/15/21 1606  BP: 131/66 112/61 126/81 138/70  Pulse: 99 71 98 86  Resp:  20 (!) 22 (!) 22 20  Temp: 99.4 F (37.4 C) 98.8 F (37.1 C) 97.8 F (36.6 C) 98.2 F (36.8 C)  TempSrc: Oral Oral Oral Oral  SpO2: 90% 90% 95% 98%  Weight:      Height:        Intake/Output Summary (Last 24 hours) at 06/15/2021 1849 Last data filed at 06/15/2021 1031 Gross per 24 hour  Intake 240 ml  Output 625 ml  Net -385 ml   Filed Weights   06/05/21 0500 06/06/21 0258 06/07/21 0500  Weight: 98.6 kg 100.6 kg 101.1 kg   Examination: Physical Exam:  Constitutional: WN/WD Chronically ill appearing Caucasian male in NAD and appears calm Eyes:  Lids and conjunctivae normal, sclerae anicteric  ENMT: External Ears, Nose appear normal. Grossly normal hearing. Mucous membranes are moist.   Neck: Appears normal, supple, no cervical masses, normal ROM, no appreciable thyromegaly; no appreciable JVD Respiratory: Diminished to auscultation bilaterally with coarse breath sounds, no wheezing, rales, rhonchi or crackles. Normal respiratory effort and patient is not tachypenic. No accessory muscle use.  Unlabored breathing but he is wearing supplemental oxygen via nasal cannula Cardiovascular: RRR, no murmurs / rubs / gallops. S1 and S2  auscultated. No extremity edema.  Abdomen: Soft, non-tender, distended secondary body habitus. Bowel sounds positive.  GU: Deferred. Musculoskeletal: No clubbing / cyanosis of digits/nails. No joint deformity upper and lower extremities. Skin: No rashes, lesions, ulcers. No induration; Warm and dry.  Neurologic: CN 2-12 grossly intact with no focal deficits. Romberg sign and cerebellar reflexes not assessed.  Psychiatric: Normal judgment and insight. Alert and oriented x 3. Normal mood and appropriate affect.   Data Reviewed: I have personally reviewed following labs and imaging studies  CBC: Recent Labs  Lab 06/09/21 0517 06/12/21 0435 06/15/21 0446  WBC 12.3* 10.4 11.7*  NEUTROABS  --   --  10.2*  HGB 12.6* 12.6* 13.1  HCT 37.5* 38.5* 38.9*  MCV 94.9 93.7 93.5  PLT 223 215 542   Basic Metabolic Panel: Recent Labs  Lab 06/09/21 0517 06/12/21 0435 06/13/21 0747 06/14/21 0443 06/15/21 0446  NA 135 140 138 136 133*  K 4.0 4.4 5.1 4.4 4.2  CL 92* 98 93* 95* 95*  CO2 36* 35* 35* 32 30  GLUCOSE 186* 193* 192* 244* 217*  BUN 16 18 20 20 23   CREATININE 0.59* 0.49* 0.76 0.77 0.65  CALCIUM 8.2* 8.9 9.5 8.5* 8.3*  MG  --   --  2.2  --  2.2  PHOS  --   --  5.8*  --  5.1*   GFR: Estimated Creatinine Clearance: 101.9 mL/min (by C-G formula based on SCr of 0.65 mg/dL). Liver Function Tests: Recent Labs  Lab 06/15/21 0446  AST 22  ALT 24  ALKPHOS 81  BILITOT 0.6  PROT 6.5  ALBUMIN 3.1*   No results for input(s): LIPASE, AMYLASE in the last 168 hours. No results for input(s): AMMONIA in the last 168 hours. Coagulation Profile: No results for input(s): INR, PROTIME in the last 168 hours. Cardiac Enzymes: No results for input(s): CKTOTAL, CKMB, CKMBINDEX, TROPONINI in the last 168 hours. BNP (last 3 results) No results for input(s): PROBNP in the last 8760 hours. HbA1C: No results for input(s): HGBA1C in the last 72 hours. CBG: Recent Labs  Lab 06/14/21 1628  06/14/21 2045 06/15/21 0802 06/15/21 1206 06/15/21 1635  GLUCAP 126* 139* 155* 131* 142*   Lipid Profile: No results for input(s): CHOL, HDL, LDLCALC, TRIG, CHOLHDL,  LDLDIRECT in the last 72 hours. Thyroid Function Tests: No results for input(s): TSH, T4TOTAL, FREET4, T3FREE, THYROIDAB in the last 72 hours. Anemia Panel: No results for input(s): VITAMINB12, FOLATE, FERRITIN, TIBC, IRON, RETICCTPCT in the last 72 hours. Sepsis Labs: No results for input(s): PROCALCITON, LATICACIDVEN in the last 168 hours.  No results found for this or any previous visit (from the past 240 hour(s)).   RN Pressure Injury Documentation:     Estimated body mass index is 33.89 kg/m as calculated from the following:   Height as of this encounter: 5\' 8"  (1.727 m).   Weight as of this encounter: 101.1 kg.  Malnutrition Type:   Malnutrition Characteristics:   Nutrition Interventions:    Radiology Studies: No results found.  Scheduled Meds:  dexamethasone  6 mg Oral Q12H   enoxaparin (LOVENOX) injection  0.5 mg/kg Subcutaneous Q24H   fluticasone furoate-vilanterol  1 puff Inhalation Daily   furosemide  20 mg Oral Daily   insulin aspart  0-20 Units Subcutaneous TID AC & HS   insulin aspart  12 Units Subcutaneous TID WC   insulin glargine-yfgn  54 Units Subcutaneous BID   ipratropium-albuterol  3 mL Nebulization Q6H   neomycin-bacitracin-polymyxin   Topical BID   pantoprazole  40 mg Oral BID AC   rosuvastatin  5 mg Oral Q M,W,F   sulfamethoxazole-trimethoprim  1 tablet Oral Once per day on Mon Wed Fri   tamsulosin  0.4 mg Oral Daily   Continuous Infusions:   LOS: 16 days   Kerney Elbe, DO [facility] PAGER is on AMION  If 7PM-7AM, please contact night-coverage [email or web address]

## 2021-06-16 ENCOUNTER — Inpatient Hospital Stay: Payer: BC Managed Care – PPO

## 2021-06-16 LAB — CBC WITH DIFFERENTIAL/PLATELET
Abs Immature Granulocytes: 0.11 10*3/uL — ABNORMAL HIGH (ref 0.00–0.07)
Basophils Absolute: 0 10*3/uL (ref 0.0–0.1)
Basophils Relative: 0 %
Eosinophils Absolute: 0 10*3/uL (ref 0.0–0.5)
Eosinophils Relative: 0 %
HCT: 35.9 % — ABNORMAL LOW (ref 39.0–52.0)
Hemoglobin: 12.5 g/dL — ABNORMAL LOW (ref 13.0–17.0)
Immature Granulocytes: 1 %
Lymphocytes Relative: 7 %
Lymphs Abs: 0.6 10*3/uL — ABNORMAL LOW (ref 0.7–4.0)
MCH: 32.6 pg (ref 26.0–34.0)
MCHC: 34.8 g/dL (ref 30.0–36.0)
MCV: 93.7 fL (ref 80.0–100.0)
Monocytes Absolute: 0.3 10*3/uL (ref 0.1–1.0)
Monocytes Relative: 4 %
Neutro Abs: 8.4 10*3/uL — ABNORMAL HIGH (ref 1.7–7.7)
Neutrophils Relative %: 88 %
Platelets: 200 10*3/uL (ref 150–400)
RBC: 3.83 MIL/uL — ABNORMAL LOW (ref 4.22–5.81)
RDW: 13.4 % (ref 11.5–15.5)
WBC: 9.6 10*3/uL (ref 4.0–10.5)
nRBC: 0 % (ref 0.0–0.2)

## 2021-06-16 LAB — COMPREHENSIVE METABOLIC PANEL
ALT: 24 U/L (ref 0–44)
AST: 20 U/L (ref 15–41)
Albumin: 2.9 g/dL — ABNORMAL LOW (ref 3.5–5.0)
Alkaline Phosphatase: 73 U/L (ref 38–126)
Anion gap: 5 (ref 5–15)
BUN: 22 mg/dL (ref 8–23)
CO2: 32 mmol/L (ref 22–32)
Calcium: 8.3 mg/dL — ABNORMAL LOW (ref 8.9–10.3)
Chloride: 100 mmol/L (ref 98–111)
Creatinine, Ser: 0.68 mg/dL (ref 0.61–1.24)
GFR, Estimated: >60 mL/min (ref >60)
Glucose, Bld: 219 mg/dL — ABNORMAL HIGH (ref 70–99)
Potassium: 4.7 mmol/L (ref 3.5–5.1)
Sodium: 137 mmol/L (ref 135–145)
Total Bilirubin: 0.7 mg/dL (ref 0.3–1.2)
Total Protein: 6.3 g/dL — ABNORMAL LOW (ref 6.5–8.1)

## 2021-06-16 LAB — PHOSPHORUS: Phosphorus: 4.2 mg/dL (ref 2.5–4.6)

## 2021-06-16 LAB — GLUCOSE, CAPILLARY
Glucose-Capillary: 162 mg/dL — ABNORMAL HIGH (ref 70–99)
Glucose-Capillary: 326 mg/dL — ABNORMAL HIGH (ref 70–99)
Glucose-Capillary: 183 mg/dL — ABNORMAL HIGH (ref 70–99)
Glucose-Capillary: 281 mg/dL — ABNORMAL HIGH (ref 70–99)

## 2021-06-16 LAB — C-REACTIVE PROTEIN: CRP: 0.6 mg/dL (ref <1.0)

## 2021-06-16 LAB — MAGNESIUM: Magnesium: 2.2 mg/dL (ref 1.7–2.4)

## 2021-06-16 NOTE — Progress Notes (Signed)
NAME:  CORTAVIOUS NIX, MRN:  [id number], DOB:  1951-02-27, LOS: 41 ADMISSION DATE:  05/30/2021, CONSULTATION DATE:  05/30/21 REFERRING MD:  Dr. Creig Hines, CHIEF COMPLAINT:   Shortness of breath  History of Present Illness:  70 year old male presenting to [facility] ED from home on 05/30/2021 with complaints of progressive hypoxia at rest/with exertion and dyspnea with exertion.  Of note patient was recently hospitalized with COVID-19 pneumonia from 05/09/2021 to 05/13/2021.  He describes that since discharge he now wears continuous oxygen via nasal cannula.  He started at 2 L at rest and 4 L with ambulation, but reports he has had to increase to 5 L nasal cannula at rest which maintains his SPO2 greater than 90%.  He reports with any exertion his SPO2 drops into the 50s to 60%.  Earlier today he took a shower with his wife's assistance his oxygen dropped to 58% and it took 45 minutes for him to recover above 90% with his nasal cannula.  He does admit to feeling dizzy today correlating with his hypoxia, but denies any recent falls or injuries. He does have a CPAP machine to wear at night for his OSA, but admits that since discharge there was equipment difficulties and he has not been able to use it consistently.  He complains of persistent coughing spells with mostly clear phlegm, however earlier today he did notice some red streaks in his sputum.  He denies chest pain, nausea/vomiting/diarrhea/abdominal pain, fever/chills, or sore throat.      Pertinent  Medical History  Pulmonary fibrosis Hypertension Hyperlipidemia Type 2 diabetes Obstructive sleep apnea COVID-19 (September 2022) Prostate cancer status post seed ablation and Lupron injections BPH GERD Bronchitis Significant Hospital Events: Including procedures, antibiotic start and stop dates in addition to other pertinent events     06/13/21- patient vomited post dinner yesterday but denies aspiration.  He felt GERD symptoms and indigestion.  I  have upgraded protonix to BID which is what he takes at home.   06/14/21- patient feels improved, he is able to speak longer without desaturation. Plan to reduce dexamethasone to 6 bid.  I spoke to [facility] for possible short term stay while weaning of HFNC 06/15/21- patient slowly improving but seems to still be struggling with labored breathing.  Discussed LTACH with patient.  He has uncontrolled GERD 06/16/21- patient is agreeable to [facility] he is still on 4-6L/min and with just urination or any motion/activity he desaturates to 60%on oxygen. He does not feel safe on his own and should be weaned in controlled setting.    Objective   Blood pressure 134/84, pulse 77, temperature 98.8 F (37.1 C), temperature source Oral, resp. rate 20, height 5\' 8"  (1.727 m), weight 101.1 kg, SpO2 92 %.        Intake/Output Summary (Last 24 hours) at 06/16/2021 0914 Last data filed at 06/15/2021 1031 Gross per 24 hour  Intake 240 ml  Output --  Net 240 ml    Filed Weights   06/05/21 0500 06/06/21 0258 06/07/21 0500  Weight: 98.6 kg 100.6 kg 101.1 kg    Examination: General: Adult male, acutely ill, lying in bed, NAD HEENT: MM pink/moist, anicteric, atraumatic, neck supple Neuro: A&O x 4, able to follow commands, PERRL +3, MAE CV: s1s2 RRR, sinus tachycardia on monitor, no r/m/g Pulm: Rmild rhonchi bilaterally  breath sounds coarse/diminished-BUL & diminished-BLL GI: soft, rounded, non tender, bs x 4 Skin: Limited exam-patient still in street clothes> no rashes/lesions noted Extremities: warm/dry, pulses +  2 R/P, trace edema noted BLE  Resolved Hospital Problem list     Assessment & Plan:  Acute Hypoxic Respiratory Failure       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  - dcd home Vitamin C & Zinc -increased protonix to bid Type 2 Diabetes Mellitus At Risk for Steroid Induced Hyperglycemia - Monitor CBG AC, HS - SSI moderate dosing, continue home long-acting coverage (solostar 40 units daily) >> Lantus 20 units BID - target range while in ICU: 140-180 - follow ICU hyper/hypo-glycemia protocol - Carb-modified diet in place -contiue dexamethasone at current dose   BPH - continue home flomax - strict I & O's, monitor for urinary retention  Hyperlipidemia  Hypertension - continue home rosuvastatin - due to SIRS response to suspected Pneumonia with marginal-normal BP, will hold outpatient anti-hypertensives. Consider restarting: Losartan as patient stabilizes - PRN hydralazine if SBP > 160  Best Practice (right click and "Reselect all SmartList Selections" daily)  Diet/type: Regular consistency (see orders) DVT prophylaxis: LMWH GI prophylaxis: PPI Lines: N/A Foley:  N/A Code Status:  full code Last date of multidisciplinary goals of care discussion [05/30/2021]  IMAGING   Recent Labs  Lab 06/12/21 0435 06/15/21 0446 06/16/21 0505  WBC 10.4 11.7* 9.6  NEUTROABS  --  10.2* 8.4*  HGB 12.6* 13.1 12.5*  HCT 38.5* 38.9* 35.9*  MCV 93.7 93.5 93.7  PLT 215 249 200     Basic Metabolic Panel: Recent Labs  Lab 06/12/21 0435 06/13/21 0747 06/14/21 0443 06/15/21 0446 06/16/21 0505  NA 140 138 136 133* 137  K 4.4 5.1 4.4 4.2 4.7  CL 98 93* 95* 95* 100  CO2 35* 35* 32 30 32  GLUCOSE 193* 192* 244* 217* 219*  BUN 18 20 20 23 22   CREATININE 0.49* 0.76 0.77 0.65 0.68  CALCIUM 8.9 9.5 8.5* 8.3* 8.3*  MG  --  2.2  --  2.2 2.2  PHOS  --  5.8*  --  5.1* 4.2    GFR: Estimated Creatinine Clearance: 101.9 mL/min (by C-G formula based on SCr of 0.68  mg/dL). Recent Labs  Lab 06/12/21 0435 06/15/21 0446 06/16/21 0505  WBC 10.4 11.7* 9.6     Liver Function Tests: Recent Labs  Lab 06/15/21 0446 06/16/21 0505  AST 22 20  ALT 24 24  ALKPHOS 81 73  BILITOT 0.6 0.7  PROT 6.5 6.3*  ALBUMIN 3.1* 2.9*    No results for input(s): LIPASE, AMYLASE in the last 168 hours. No results for input(s): AMMONIA in the last 168 hours.  ABG No results found for: PHART, PCO2ART, PO2ART, HCO3, TCO2, ACIDBASEDEF, O2SAT   Coagulation Profile: No results for input(s): INR, PROTIME in the last 168 hours.  Cardiac Enzymes: No results for input(s): CKTOTAL, CKMB, CKMBINDEX, TROPONINI in the last 168 hours.  HbA1C: Hgb A1c MFr Bld  Date/Time Value Ref Range Status  05/09/2021 06:18 AM 8.6 (H) 4.8 - 5.6 % Final    Comment:    (NOTE) Pre diabetes:  5.7%-6.4%  Diabetes:              >6.4%  Glycemic control for   <7.0% adults with diabetes     CBG: Recent Labs  Lab 06/15/21 0802 06/15/21 1206 06/15/21 1635 06/15/21 2059 06/16/21 0728  GLUCAP 155* 131* 142* 138* 162*     Review of Systems: Positives in BOLD   Gen: Denies fever, chills, weight change, fatigue, night sweats HEENT: Denies blurred vision, double vision, hearing loss, tinnitus, sinus congestion, rhinorrhea, sore throat, neck stiffness, dysphagia PULM: Denies shortness of breath, cough, sputum production, hemoptysis, wheezing CV: Denies chest pain, edema, orthopnea, paroxysmal nocturnal dyspnea, palpitations GI: Denies abdominal pain, nausea, vomiting, diarrhea, hematochezia, melena, constipation, change in bowel habits GU: Denies dysuria, hematuria, polyuria, oliguria, urethral discharge Endocrine: Denies hot or cold intolerance, polyuria, polyphagia or appetite change Derm: Denies rash, dry skin, scaling or peeling skin change Heme: Denies easy bruising, bleeding, bleeding gums Neuro: Denies headache, numbness, weakness, slurred speech, loss of memory or  consciousness  Past Medical History:  He,  has a past medical history of Chronic airway obstruction (HCC), DDD (degenerative disc disease), lumbar, Dupuytren's disease, Dyslipidemia (04/07/2014), Esophageal reflux (04/20/2014), Hypertension (04/20/2014), Microalbuminuria (04/07/2014), Microalbuminuria, Obesity, unspecified (04/07/2014), Sleep apnea (04/20/2014), and 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   Surgical History:   Past Surgical History:  Procedure Laterality Date   BACK SURGERY  1992, 2005   CLOSED REDUCTION FINGER WITH PERCUTANEOUS PINNING Left 02/12/2020   Procedure: Closed reduction and pinning of left first metacarpal fracture with possible open reduction;  Surgeon: Hessie Knows, MD;  Location: [facility] ORS;  Service: Orthopedics;  Laterality: Left;   COLONOSCOPY WITH PROPOFOL N/A 12/28/2017   Procedure: COLONOSCOPY WITH PROPOFOL;  Surgeon: Manya Silvas, MD;  Location: [facility] ENDOSCOPY;  Service: Endoscopy;  Laterality: N/A;   NASAL SEPTUM SURGERY     RADIOACTIVE SEED IMPLANT N/A 04/12/2021   Procedure: RADIOACTIVE SEED IMPLANT/BRACHYTHERAPY IMPLANT;  Surgeon: Abbie Sons, MD;  Location: [facility] ORS;  Service: Urology;  Laterality: N/A;  73 seeds implanted   VASECTOMY  2005     Social History:   reports that he has quit smoking. He has never used smokeless tobacco. He reports that he does not currently use alcohol. He reports that he does not currently use drugs.   Family History:  His family history is negative for Prostate cancer and Kidney cancer.   Allergies No Known Allergies   Home Medications  Prior to Admission medications   Medication Sig Start Date End Date Taking? Authorizing Provider  albuterol (VENTOLIN HFA) 108 (90 Base) MCG/ACT inhaler Inhale 2 puffs into the lungs every 6 (six) hours as needed for wheezing or shortness of breath. 01/20/20  Yes [provider]  ascorbic acid (VITAMIN C) 1000 MG tablet Take 0.5 tablets (500  mg total) by mouth daily. 05/14/21  Yes Wieting, Richard, MD  cholecalciferol (VITAMIN D) 25 MCG (1000 UNIT) tablet Take 1,000 Units by mouth in the morning.   Yes [provider]  fluticasone-salmeterol (ADVAIR HFA) 115-21 MCG/ACT inhaler Inhale 2 puffs into the lungs 2 (two) times daily.   Yes [provider]  furosemide (LASIX) 20 MG tablet Take 20 mg by mouth daily. 05/26/21  Yes [provider]  gabapentin (NEURONTIN) 300 MG capsule Take 300 mg by mouth 3 (three) times daily. 05/26/21  Yes [provider]  insulin aspart (NOVOLOG) 100 UNIT/ML FlexPen Inject 8 Units into the skin 3 (three) times daily with meals.  Okay to substitute generic 05/13/21  Yes Wieting, Richard, MD  losartan (COZAAR) 50 MG tablet Take 50 mg by mouth in the morning. 10/07/17  Yes [provider]  OZEMPIC, 1 MG/DOSE, 4 MG/3ML SOPN Inject 1 mg into the skin every Sunday. 07/29/19  Yes [provider]  pantoprazole (PROTONIX) 40 MG tablet Take 40 mg by mouth 2 (two) times daily. 10/19/17  Yes [provider]  pioglitazone (ACTOS) 30 MG tablet Take 30 mg by mouth in the morning. 07/27/19  Yes [provider]  rosuvastatin (CRESTOR) 5 MG tablet Take 5 mg by mouth every Monday, Wednesday, and Friday. In the morning 04/19/18  Yes [provider]  tamsulosin (FLOMAX) 0.4 MG CAPS capsule Take 1 capsule (0.4 mg total) by mouth daily. 03/25/21  Yes Vaillancourt, Samantha, PA-C  TOUJEO MAX SOLOSTAR 300 UNIT/ML Solostar Pen Inject 50 Units into the skin in the morning. Patient taking differently: Inject 40 Units into the skin in the morning. 05/13/21  Yes Wieting, Richard, MD  XIGDUO XR 12-998 MG TB24 Take 2 tablets by mouth every morning. 01/31/21  Yes [provider]  zinc sulfate 220 (50 Zn) MG capsule Take 1 capsule (220 mg total) by mouth daily. 05/14/21  Yes Wieting, Richard, MD  aspirin EC 81 MG tablet Take 81 mg by mouth in the morning. Swallow  whole. Patient not taking: Reported on 05/30/2021    [provider]  chlorpheniramine-HYDROcodone (TUSSIONEX) 10-8 MG/5ML SUER Take 5 mLs by mouth every 12 (twelve) hours as needed for cough. Patient not taking: No sig reported 05/13/21   Loletha Grayer, MD  feeding supplement, GLUCERNA SHAKE, (GLUCERNA SHAKE) LIQD Take 237 mLs by mouth 2 (two) times daily between meals. 05/13/21   Loletha Grayer, MD  fluticasone-salmeterol (ADVAIR HFA) 315-17 MCG/ACT inhaler Inhale 2 puffs into the lungs 2 (two) times daily. Rinse mouth out with water after use Patient not taking: No sig reported 05/13/21   Loletha Grayer, MD  Insulin Pen Needle 33G X 4 MM MISC 1 Dose by Does not apply route 3 (three) times daily before meals. 05/13/21   Loletha Grayer, MD  predniSONE (DELTASONE) 10 MG tablet Three tabs po daily for five days Patient not taking: No sig reported 05/13/21   Loletha Grayer, MD       Ottie Glazier, M.D.  Pulmonary & Critical Care Medicine

## 2021-06-16 NOTE — Progress Notes (Signed)
PROGRESS NOTE    Dylan Pittman  RJJ:[id number] DOB: [birth year] DOA: 05/30/2021 PCP: Sofie Hartigan, MD   Brief Narrative:  The patient is a 70 year old Caucasian male with a past medical history significant for but limited to pulmonary fibrosis, hypertension, hyperlipidemia, diabetes mellitus type 2, obstructive sleep apnea, history of prostate cancer status post seed ablation and Lupron, BPH, GERD as well as other comorbidities who presented to [facility] ED from home on 05/30/2021 with progressive hypoxia at rest and severe dyspnea on exertion.  Patient was recently hospitalized with COVID-pneumonia on 05/09/2021 and discharged on 05/14/2019.  He states that his dyspnea only worsened after being discharged home with increasing oxygen support requirements every day.  He noticed that his oxygen saturations were dropping the 50 to 60% range with exertion.  He was admitted on 05/30/2021 and required heated high flow nasal cannula and started weaning his heated high flow Nasal cannula on 06/02/2021.  On 06/04/2021 he continued to wear nasal cannula at 10 L with a Salter and then weaned to 7 L on 06/08/2021.  He is very slow to improve but he is symptomatically improving.  Assessment & Plan:   Principal Problem:   Acute on chronic respiratory failure with hypoxia (HCC) Active Problems:   Dyslipidemia   Hypertension   Sleep apnea   Type 2 diabetes mellitus with hyperlipidemia (HCC)   Benign prostatic hyperplasia without lower urinary tract symptoms   ILD (interstitial lung disease) (HCC)  Acute on chronic hypoxic respiratory failure - Post CoViD ILD / Pulm Fibrosis - ILD w/ acute exacerbation - possible Legionella  -Care per Pulmonary -weaning oxygen as able - pulm weaning steroids, now transitioned to oral dexamethasone 6 mg p.o twice daily.  -Slowly weaning o2, down to 4 Liters today but with any motion or activity he desaturates significantly  -SpO2: 92 % O2 Flow Rate (L/min): 4 L/min FiO2 (%): 50  % -Is s/p 10 days azith for possible Legionella.  -Continuing pulm toilet, inhalers. Bactrim started for pneumocystis ppx.  -Pulm thinks could be weeks until stable enough to return home, TOC is exploring LTACH options and per Patient's discussion with Pulmonary he would be ok with going to [facility].  -Pulmonary recommends continuing supplemental oxygen to maintain O2 saturation greater than 88% and continue heated high flow nasal cannula if needed and wean FiO2 as tolerated; they are considering transitioning to BiPAP overnight due to his OSA -Pulmonary recommends intermittent chest x-ray and an ABG as needed -They are also recommending ensuring pulmonary hygiene and recommending incentive spirometry every hour x10 while awake -Patient has Ativan prn for anxiety. Subjectively improved today. -C/w Furosemide 20 mg po Daily  -C/w Breo Ellipta 200-25 mcg/INH 1 puff IH and c/w DuoNeb 3 mL q6h as well Albuterol 2.5 mg Neb q2hprn Wheezing and SOB -C/w Tussionex 5 mg po q12hprn Cough and Robitussin DM 5 mg po q4hprn  -Repeat chest x-ray and continue ambulation and mobilization   DM2 with Hyperglycemia -CBG moderate elevations fasting, pre-prandial wnl.  -Increased Semglee from 46 to 54 bid and Increased mealtime Novolog Insulin from 10 to 12 -CBGs ranging from 131-326  Anal Fissure v/s small hemorrhoid  -Scant BRBPR resolved -Keep lubricated w/ vaseline or neosporin - trial of anusol suppository  -keep stools loose - monitor    BPH -Continue usual home medical therapy with Tamsulosin 0.4 mg po Daily   HLD -Continue usual home medical therapy with Rosuvastatin 5 mg po MWF    HTN -Wnl, home losartan on  hold; C/w Furosemide as above  -Continue to Monitor BP per Protocol  -Last BP was 132/67  Leukocytosis -Mild and in the setting of Steroid Demargination -WBC went from 9.8 -> 10.2 -> 12.3 -> 10.4 -> 11.7 -> 9.6 -Continue to Monitor and Trend  -Repeat CBC in the AM   GERD/GI Prophylaxis   -C/w Pantoprazole 40 mg po BID    DVT prophylaxis: Enoxaparin 50 mg sq q24h Code Status: FULL CODE  Family Communication: Discussed with friend at bedside  Disposition Plan: Pending further clinical improvement and clearance by Pulmonary   Status is: Inpatient  Remains inpatient appropriate because: Increased Respiratory Effort and will need Pulmonary Clearance for a Safe Discharge Disposition as he is not currently not medically stable to D/C    Consultants:  Pulmonary  PCCM  Procedures/Significant Events:  Significant Events:  10/3 admit via ED - required [place] 10/6 weaning of [place]  10/8 weaned to nasal cannula at 10 L Salter  10/12 weaned to 7L, symptomatically improving   Antimicrobials:  Anti-infectives (From admission, onward)    Start     Dose/Rate Route Frequency Ordered Stop   06/13/21 0900  sulfamethoxazole-trimethoprim (BACTRIM DS) 800-160 MG per tablet 1 tablet        1 tablet Oral Once per day on Mon Wed Fri 06/11/21 1050     06/06/21 1115  levofloxacin (LEVAQUIN) tablet 750 mg  Status:  Discontinued        750 mg Oral Daily 06/06/21 1029 06/06/21 1120   06/06/21 1115  azithromycin (ZITHROMAX) tablet 500 mg  Status:  Discontinued        500 mg Oral Daily 06/06/21 1029 06/10/21 1245   06/04/21 1000  azithromycin (ZITHROMAX) tablet 500 mg       See Hyperspace for full Linked Orders Report.   500 mg Oral Daily 06/03/21 1636 06/05/21 0952   06/02/21 1000  azithromycin (ZITHROMAX) tablet 250 mg  Status:  Discontinued       See Hyperspace for full Linked Orders Report.   250 mg Oral Daily 06/01/21 1042 06/03/21 1636   06/01/21 1130  azithromycin (ZITHROMAX) tablet 500 mg       See Hyperspace for full Linked Orders Report.   500 mg Oral Daily 06/01/21 1042 06/01/21 1211   05/31/21 0900  vancomycin (VANCOCIN) IVPB 1000 mg/200 mL premix  Status:  Discontinued        1,000 mg 200 mL/hr over 60 Minutes Intravenous Every 12 hours 05/30/21 2155 06/01/21 1042   05/31/21  0400  ceFEPIme (MAXIPIME) 2 g in sodium chloride 0.9 % 100 mL IVPB  Status:  Discontinued        2 g 200 mL/hr over 30 Minutes Intravenous Every 8 hours 05/30/21 2155 06/01/21 1042   05/30/21 1815  vancomycin (VANCOREADY) IVPB 2000 mg/400 mL        2,000 mg 200 mL/hr over 120 Minutes Intravenous  Once 05/30/21 1807 05/30/21 2327   05/30/21 1815  ceFEPIme (MAXIPIME) 2 g in sodium chloride 0.9 % 100 mL IVPB        2 g 200 mL/hr over 30 Minutes Intravenous  Once 05/30/21 1807 05/30/21 2045       Subjective: Seen and examined at bedside and thinks he is doing a little bit better.  He denies any nausea or vomiting.  No chest pain or shortness of breath but desaturates with just very minimal exertion.  After discussing with pulmonary he is more open to LTAC.  [facility] has offered  a bed and insurance authorization has been initiated.  He denies any other concerns or complaints this time.  Objective: Vitals:   06/15/21 2009 06/16/21 0112 06/16/21 0451 06/16/21 0727  BP:   129/71 134/84  Pulse:   84 77  Resp:   17 20  Temp:   98.2 F (36.8 C) 98.8 F (37.1 C)  TempSrc:   Oral Oral  SpO2: 94% 93% 94% 92%  Weight:      Height:        Intake/Output Summary (Last 24 hours) at 06/16/2021 0816 Last data filed at 06/15/2021 1031 Gross per 24 hour  Intake 240 ml  Output --  Net 240 ml    Filed Weights   06/05/21 0500 06/06/21 0258 06/07/21 0500  Weight: 98.6 kg 100.6 kg 101.1 kg   Examination:  Physical Exam:  Constitutional: WN/WD chronically ill appearing Caucasian male in NAD and appears calm Eyes: Lids and conjunctivae normal, sclerae anicteric  ENMT: External Ears, Nose appear normal. Grossly normal hearing. Mucous membranes are moist.  Neck: Appears normal, supple, no cervical masses, normal ROM, no appreciable thyromegaly; no JVD  Respiratory: Diminished to auscultation bilaterally with coarse, no wheezing, rales, rhonchi or crackles. Normal respiratory effort and patient is not  tachypenic. No accessory muscle use. Unlabored breathing but wearing supplemental O2 via [place] Cardiovascular: RRR, no murmurs / rubs / gallops. S1 and S2 auscultated. No extremity edema.  Abdomen: Soft, non-tender, Distended. Bowel sounds positive.  GU: Deferred. Musculoskeletal: No clubbing / cyanosis of digits/nails. No joint deformity upper and lower extremities. Skin: No rashes, lesions, ulcers. No induration; Warm and dry.  Neurologic: CN 2-12 grossly intact with no focal deficits. Romberg sign and cerebellar reflexes not assessed.  Psychiatric: Normal judgment and insight. Alert and oriented x 3. Normal mood and appropriate affect.   Data Reviewed: I have personally reviewed following labs and imaging studies  CBC: Recent Labs  Lab 06/12/21 0435 06/15/21 0446 06/16/21 0505  WBC 10.4 11.7* 9.6  NEUTROABS  --  10.2* 8.4*  HGB 12.6* 13.1 12.5*  HCT 38.5* 38.9* 35.9*  MCV 93.7 93.5 93.7  PLT 215 249 355    Basic Metabolic Panel: Recent Labs  Lab 06/12/21 0435 06/13/21 0747 06/14/21 0443 06/15/21 0446 06/16/21 0505  NA 140 138 136 133* 137  K 4.4 5.1 4.4 4.2 4.7  CL 98 93* 95* 95* 100  CO2 35* 35* 32 30 32  GLUCOSE 193* 192* 244* 217* 219*  BUN 18 20 20 23 22   CREATININE 0.49* 0.76 0.77 0.65 0.68  CALCIUM 8.9 9.5 8.5* 8.3* 8.3*  MG  --  2.2  --  2.2 2.2  PHOS  --  5.8*  --  5.1* 4.2   GFR: Estimated Creatinine Clearance: 101.9 mL/min (by C-G formula based on SCr of 0.68 mg/dL). Liver Function Tests: Recent Labs  Lab 06/15/21 0446 06/16/21 0505  AST 22 20  ALT 24 24  ALKPHOS 81 73  BILITOT 0.6 0.7  PROT 6.5 6.3*  ALBUMIN 3.1* 2.9*    No results for input(s): LIPASE, AMYLASE in the last 168 hours. No results for input(s): AMMONIA in the last 168 hours. Coagulation Profile: No results for input(s): INR, PROTIME in the last 168 hours. Cardiac Enzymes: No results for input(s): CKTOTAL, CKMB, CKMBINDEX, TROPONINI in the last 168 hours. BNP (last 3  results) No results for input(s): PROBNP in the last 8760 hours. HbA1C: No results for input(s): HGBA1C in the last 72 hours. CBG: Recent Labs  Lab 06/15/21 0802 06/15/21 1206 06/15/21 1635 06/15/21 2059 06/16/21 0728  GLUCAP 155* 131* 142* 138* 162*    Lipid Profile: No results for input(s): CHOL, HDL, LDLCALC, TRIG, CHOLHDL, LDLDIRECT in the last 72 hours. Thyroid Function Tests: No results for input(s): TSH, T4TOTAL, FREET4, T3FREE, THYROIDAB in the last 72 hours. Anemia Panel: No results for input(s): VITAMINB12, FOLATE, FERRITIN, TIBC, IRON, RETICCTPCT in the last 72 hours. Sepsis Labs: No results for input(s): PROCALCITON, LATICACIDVEN in the last 168 hours.  No results found for this or any previous visit (from the past 240 hour(s)).   RN Pressure Injury Documentation:     Estimated body mass index is 33.89 kg/m as calculated from the following:   Height as of this encounter: 5\' 8"  (1.727 m).   Weight as of this encounter: 101.1 kg.  Malnutrition Type:   Malnutrition Characteristics:   Nutrition Interventions:    Radiology Studies: DG Chest Port 1 View  Result Date: 06/16/2021 CLINICAL DATA:  Short of breath EXAM: PORTABLE CHEST 1 VIEW COMPARISON:  06/11/2021 FINDINGS: Heart size within normal limits. Low lung volumes. Patchy interstitial opacities with background of coarsened interstitial markings throughout both lungs, slightly worsened within the bibasilar and left perihilar regions. No significant pleural fluid collection. No pneumothorax. IMPRESSION: Patchy interstitial opacities with background of coarsened interstitial markings throughout both lungs, slightly worsened within the bibasilar and left perihilar regions. Electronically Signed   By: Davina Poke D.O.   On: 06/16/2021 07:32    Scheduled Meds:  dexamethasone  6 mg Oral Q12H   enoxaparin (LOVENOX) injection  0.5 mg/kg Subcutaneous Q24H   fluticasone furoate-vilanterol  1 puff Inhalation  Daily   furosemide  20 mg Oral Daily   insulin aspart  0-20 Units Subcutaneous TID AC & HS   insulin aspart  12 Units Subcutaneous TID WC   insulin glargine-yfgn  54 Units Subcutaneous BID   ipratropium-albuterol  3 mL Nebulization Q6H   neomycin-bacitracin-polymyxin   Topical BID   pantoprazole  40 mg Oral BID AC   rosuvastatin  5 mg Oral Q M,W,F   sulfamethoxazole-trimethoprim  1 tablet Oral Once per day on Mon Wed Fri   tamsulosin  0.4 mg Oral Daily   Continuous Infusions:   LOS: 17 days   Kerney Elbe, DO [facility] PAGER is on AMION  If 7PM-7AM, please contact night-coverage [email or web address]

## 2021-06-16 NOTE — TOC Progression Note (Signed)
Transition of Care [facility]) - Progression Note    Patient Details  Name: Dylan Pittman MRN: [id number] Date of Birth: 1951-01-14  [facility] CM/SW Contact  Shelbie Hutching, RN Phone Number: 06/16/2021, 3:20 PM  Clinical Narrative:    Patient is still requiring increased supplemental oxygen especially with any exertion.  Patient is interested in [facility] after speaking with Dr. Lanney Gins.  [facility] cannot offer a bed at this time but [facility] does have a bed and will start insurance authorization.     Expected Discharge Plan: Long Term Acute Care (LTAC) Barriers to Discharge: Continued Medical Work up  Expected Discharge Plan and Services Expected Discharge Plan: Long Term Acute Care (LTAC)   Discharge Planning Services: CM Consult Post Acute Care Choice: Long Term Acute Care (LTAC) Living arrangements for the past 2 months: Single Family Home                 DME Arranged:  (No DME recommended, has home o2 with adapt, no issues)                     Social Determinants of Health (SDOH) Interventions    Readmission Risk Interventions No flowsheet data found.

## 2021-06-17 LAB — COMPREHENSIVE METABOLIC PANEL
ALT: 24 U/L (ref 0–44)
AST: 17 U/L (ref 15–41)
Albumin: 3.3 g/dL — ABNORMAL LOW (ref 3.5–5.0)
Alkaline Phosphatase: 83 U/L (ref 38–126)
Anion gap: 7 (ref 5–15)
BUN: 23 mg/dL (ref 8–23)
CO2: 30 mmol/L (ref 22–32)
Calcium: 8.3 mg/dL — ABNORMAL LOW (ref 8.9–10.3)
Chloride: 100 mmol/L (ref 98–111)
Creatinine, Ser: 0.6 mg/dL — ABNORMAL LOW (ref 0.61–1.24)
GFR, Estimated: >60 mL/min (ref >60)
Glucose, Bld: 248 mg/dL — ABNORMAL HIGH (ref 70–99)
Potassium: 4.1 mmol/L (ref 3.5–5.1)
Sodium: 137 mmol/L (ref 135–145)
Total Bilirubin: 0.5 mg/dL (ref 0.3–1.2)
Total Protein: 6.5 g/dL (ref 6.5–8.1)

## 2021-06-17 LAB — CBC WITH DIFFERENTIAL/PLATELET
Abs Immature Granulocytes: 0.14 10*3/uL — ABNORMAL HIGH (ref 0.00–0.07)
Basophils Absolute: 0 10*3/uL (ref 0.0–0.1)
Basophils Relative: 0 %
Eosinophils Absolute: 0.1 10*3/uL (ref 0.0–0.5)
Eosinophils Relative: 1 %
HCT: 37.8 % — ABNORMAL LOW (ref 39.0–52.0)
Hemoglobin: 12.7 g/dL — ABNORMAL LOW (ref 13.0–17.0)
Immature Granulocytes: 1 %
Lymphocytes Relative: 6 %
Lymphs Abs: 0.6 10*3/uL — ABNORMAL LOW (ref 0.7–4.0)
MCH: 31.5 pg (ref 26.0–34.0)
MCHC: 33.6 g/dL (ref 30.0–36.0)
MCV: 93.8 fL (ref 80.0–100.0)
Monocytes Absolute: 0.5 10*3/uL (ref 0.1–1.0)
Monocytes Relative: 5 %
Neutro Abs: 8.8 10*3/uL — ABNORMAL HIGH (ref 1.7–7.7)
Neutrophils Relative %: 87 %
Platelets: 205 10*3/uL (ref 150–400)
RBC: 4.03 MIL/uL — ABNORMAL LOW (ref 4.22–5.81)
RDW: 13.3 % (ref 11.5–15.5)
WBC: 10.1 10*3/uL (ref 4.0–10.5)
nRBC: 0 % (ref 0.0–0.2)

## 2021-06-17 LAB — C-REACTIVE PROTEIN: CRP: 0.8 mg/dL (ref <1.0)

## 2021-06-17 LAB — PHOSPHORUS: Phosphorus: 3.8 mg/dL (ref 2.5–4.6)

## 2021-06-17 LAB — GLUCOSE, CAPILLARY
Glucose-Capillary: 172 mg/dL — ABNORMAL HIGH (ref 70–99)
Glucose-Capillary: 274 mg/dL — ABNORMAL HIGH (ref 70–99)

## 2021-06-17 LAB — MAGNESIUM: Magnesium: 2 mg/dL (ref 1.7–2.4)

## 2021-06-17 MED ORDER — PHENOL 1.4 % MT LIQD: 1.0000 | OROMUCOSAL | 0 refills | Status: AC | PRN

## 2021-06-17 MED ORDER — IPRATROPIUM-ALBUTEROL 0.5-2.5 (3) MG/3ML IN SOLN: 3.0000 mL | Freq: Four times a day (QID) | RESPIRATORY_TRACT | Status: AC

## 2021-06-17 MED ORDER — GUAIFENESIN-DM 100-10 MG/5ML PO SYRP: 5.0000 mL | ORAL_SOLUTION | ORAL | 0 refills | Status: AC | PRN

## 2021-06-17 MED ORDER — LIDOCAINE HCL (PF) 4 % IJ SOLN: 5.0000 mL | INTRAMUSCULAR | Status: AC | PRN

## 2021-06-17 MED ORDER — SALINE SPRAY 0.65 % NA SOLN: 1.0000 | NASAL | 0 refills | Status: AC | PRN

## 2021-06-17 MED ORDER — DOCUSATE SODIUM 100 MG PO CAPS: 100.0000 mg | ORAL_CAPSULE | Freq: Two times a day (BID) | ORAL | 0 refills | Status: AC | PRN

## 2021-06-17 MED ORDER — INSULIN GLARGINE-YFGN 100 UNIT/ML ~~LOC~~ SOLN: 54.0000 [IU] | Freq: Two times a day (BID) | SUBCUTANEOUS | 11 refills | Status: AC

## 2021-06-17 MED ORDER — HYDROCOD POLST-CPM POLST ER 10-8 MG/5ML PO SUER: 5.0000 mL | Freq: Two times a day (BID) | ORAL | 0 refills | Status: AC | PRN

## 2021-06-17 MED ORDER — BACITRACIN-NEOMYCIN-POLYMYXIN 400-5-5000 EX OINT: TOPICAL_OINTMENT | Freq: Two times a day (BID) | CUTANEOUS | 0 refills | Status: AC

## 2021-06-17 MED ORDER — INSULIN ASPART 100 UNIT/ML IJ SOLN: 0.0000 [IU] | Freq: Three times a day (TID) | INTRAMUSCULAR | 11 refills | Status: AC

## 2021-06-17 MED ORDER — INSULIN ASPART 100 UNIT/ML IJ SOLN: 12.0000 [IU] | Freq: Three times a day (TID) | INTRAMUSCULAR | 11 refills | Status: AC

## 2021-06-17 MED ORDER — LORAZEPAM 0.5 MG PO TABS: 0.5000 mg | ORAL_TABLET | Freq: Four times a day (QID) | ORAL | 0 refills | Status: AC | PRN

## 2021-06-17 MED ORDER — POLYVINYL ALCOHOL 1.4 % OP SOLN: 1.0000 [drp] | OPHTHALMIC | 0 refills | Status: AC | PRN

## 2021-06-17 MED ORDER — DEXAMETHASONE 6 MG PO TABS: 6.0000 mg | ORAL_TABLET | Freq: Two times a day (BID) | ORAL | Status: AC

## 2021-06-17 MED ORDER — POLYETHYLENE GLYCOL 3350 17 G PO PACK: 17.0000 g | PACK | Freq: Every day | ORAL | 0 refills | Status: AC | PRN

## 2021-06-17 MED ORDER — SULFAMETHOXAZOLE-TRIMETHOPRIM 800-160 MG PO TABS: 1.0000 | ORAL_TABLET | ORAL | Status: AC

## 2021-06-17 NOTE — Progress Notes (Signed)
Report called to [place], [facility] to transport pt at 3p

## 2021-06-17 NOTE — Progress Notes (Signed)
NAME:  Dylan Pittman, MRN:  [id number], DOB:  [birth year], LOS: 58 ADMISSION DATE:  05/30/2021, CONSULTATION DATE:  05/30/21 REFERRING MD:  Dr. Creig Hines, CHIEF COMPLAINT:   Shortness of breath  History of Present Illness:  70 year old male presenting to [facility] ED from home on 05/30/2021 with complaints of progressive hypoxia at rest/with exertion and dyspnea with exertion.  Of note patient was recently hospitalized with COVID-19 pneumonia from 05/09/2021 to 05/13/2021.  He describes that since discharge he now wears continuous oxygen via nasal cannula.  He started at 2 L at rest and 4 L with ambulation, but reports he has had to increase to 5 L nasal cannula at rest which maintains his SPO2 greater than 90%.  He reports with any exertion his SPO2 drops into the 50s to 60%.  Earlier today he took a shower with his wife's assistance his oxygen dropped to 58% and it took 45 minutes for him to recover above 90% with his nasal cannula.  He does admit to feeling dizzy today correlating with his hypoxia, but denies any recent falls or injuries. He does have a CPAP machine to wear at night for his OSA, but admits that since discharge there was equipment difficulties and he has not been able to use it consistently.  He complains of persistent coughing spells with mostly clear phlegm, however earlier today he did notice some red streaks in his sputum.  He denies chest pain, nausea/vomiting/diarrhea/abdominal pain, fever/chills, or sore throat.      Pertinent  Medical History  Pulmonary fibrosis Hypertension Hyperlipidemia Type 2 diabetes Obstructive sleep apnea COVID-19 (September 2022) Prostate cancer status post seed ablation and Lupron injections BPH GERD Bronchitis Significant Hospital Events: Including procedures, antibiotic start and stop dates in addition to other pertinent events     06/13/21- patient vomited post dinner yesterday but denies aspiration.  He felt GERD symptoms and indigestion.  I  have upgraded protonix to BID which is what he takes at home.   06/14/21- patient feels improved, he is able to speak longer without desaturation. Plan to reduce dexamethasone to 6 bid.  I spoke to [facility] for possible short term stay while weaning of HFNC 06/15/21- patient slowly improving but seems to still be struggling with labored breathing.  Discussed LTACH with patient.  He has uncontrolled GERD 06/16/21- patient is agreeable to [facility] he is still on 4-6L/min and with just urination or any motion/activity he desaturates to 60%on oxygen. He does not feel safe on his own and should be weaned in controlled setting.    Objective   Blood pressure 131/82, pulse 78, temperature 98.7 F (37.1 C), temperature source Oral, resp. rate 16, height 5\' 8"  (1.727 m), weight 101.1 kg, SpO2 99 %.        Intake/Output Summary (Last 24 hours) at 06/17/2021 0826 Last data filed at 06/16/2021 1019 Gross per 24 hour  Intake 480 ml  Output --  Net 480 ml    Filed Weights   06/05/21 0500 06/06/21 0258 06/07/21 0500  Weight: 98.6 kg 100.6 kg 101.1 kg    Examination: General: Adult male, acutely ill, lying in bed, NAD HEENT: MM pink/moist, anicteric, atraumatic, neck supple Neuro: A&O x 4, able to follow commands, PERRL +3, MAE CV: s1s2 RRR, sinus tachycardia on monitor, no r/m/g Pulm: Rmild rhonchi bilaterally  breath sounds coarse/diminished-BUL & diminished-BLL GI: soft, rounded, non tender, bs x 4 Skin: Limited exam-patient still in street clothes> no rashes/lesions noted Extremities: warm/dry, pulses +  2 R/P, trace edema noted BLE  Resolved Hospital Problem list     Assessment & Plan:  Acute Hypoxic Respiratory Failure       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  - dcd home Vitamin C & Zinc -increased protonix to bid Type 2 Diabetes Mellitus At Risk for Steroid Induced Hyperglycemia - Monitor CBG AC, HS - SSI moderate dosing, continue home long-acting coverage (solostar 40 units daily) >> Lantus 20 units BID - target range while in ICU: 140-180 - follow ICU hyper/hypo-glycemia protocol - Carb-modified diet in place -contiue dexamethasone at current dose   BPH - continue home flomax - strict I & O's, monitor for urinary retention  Hyperlipidemia  Hypertension - continue home rosuvastatin - due to SIRS response to suspected Pneumonia with marginal-normal BP, will hold outpatient anti-hypertensives. Consider restarting: Losartan as patient stabilizes - PRN hydralazine if SBP > 160  Best Practice (right click and "Reselect all SmartList Selections" daily)  Diet/type: Regular consistency (see orders) DVT prophylaxis: LMWH GI prophylaxis: PPI Lines: N/A Foley:  N/A Code Status:  full code Last date of multidisciplinary goals of care discussion [05/30/2021]  IMAGING   Recent Labs  Lab 06/12/21 0435 06/15/21 0446 06/16/21 0505 06/17/21 0226  WBC 10.4 11.7* 9.6 10.1  NEUTROABS  --  10.2* 8.4* 8.8*  HGB 12.6* 13.1 12.5* 12.7*  HCT 38.5* 38.9* 35.9* 37.8*  MCV 93.7 93.5 93.7 93.8  PLT 215 249 200 205     Basic Metabolic Panel: Recent Labs  Lab 06/13/21 0747 06/14/21 0443 06/15/21 0446 06/16/21 0505 06/17/21 0226  NA 138 136 133* 137 137  K 5.1 4.4 4.2 4.7 4.1  CL 93* 95* 95* 100 100  CO2 35* 32 30 32 30  GLUCOSE 192* 244* 217* 219* 248*  BUN 20 20 23 22 23   CREATININE 0.76 0.77 0.65 0.68 0.60*  CALCIUM 9.5 8.5* 8.3* 8.3* 8.3*  MG 2.2  --  2.2 2.2 2.0  PHOS 5.8*  --  5.1* 4.2 3.8    GFR: Estimated Creatinine Clearance:  101.9 mL/min (A) (by C-G formula based on SCr of 0.6 mg/dL (L)). Recent Labs  Lab 06/12/21 0435 06/15/21 0446 06/16/21 0505 06/17/21 0226  WBC 10.4 11.7* 9.6 10.1     Liver Function Tests: Recent Labs  Lab 06/15/21 0446 06/16/21 0505 06/17/21 0226  AST 22 20 17   ALT 24 24 24   ALKPHOS 81 73 83  BILITOT 0.6 0.7 0.5  PROT 6.5 6.3* 6.5  ALBUMIN 3.1* 2.9* 3.3*    No results for input(s): LIPASE, AMYLASE in the last 168 hours. No results for input(s): AMMONIA in the last 168 hours.  ABG No results found for: PHART, PCO2ART, PO2ART, HCO3, TCO2, ACIDBASEDEF, O2SAT   Coagulation Profile: No results for input(s): INR, PROTIME in the last 168 hours.  Cardiac Enzymes: No results for input(s): CKTOTAL, CKMB, CKMBINDEX, TROPONINI in the last 168 hours.  HbA1C: Hgb A1c MFr Bld  Date/Time Value Ref Range Status  05/09/2021 06:18 AM 8.6 (H)  4.8 - 5.6 % Final    Comment:    (NOTE) Pre diabetes:          5.7%-6.4%  Diabetes:              >6.4%  Glycemic control for   <7.0% adults with diabetes     CBG: Recent Labs  Lab 06/16/21 0728 06/16/21 1137 06/16/21 1633 06/16/21 2049 06/17/21 0730  GLUCAP 162* 326* 183* 281* 172*     Review of Systems: Positives in BOLD   Gen: Denies fever, chills, weight change, fatigue, night sweats HEENT: Denies blurred vision, double vision, hearing loss, tinnitus, sinus congestion, rhinorrhea, sore throat, neck stiffness, dysphagia PULM: Denies shortness of breath, cough, sputum production, hemoptysis, wheezing CV: Denies chest pain, edema, orthopnea, paroxysmal nocturnal dyspnea, palpitations GI: Denies abdominal pain, nausea, vomiting, diarrhea, hematochezia, melena, constipation, change in bowel habits GU: Denies dysuria, hematuria, polyuria, oliguria, urethral discharge Endocrine: Denies hot or cold intolerance, polyuria, polyphagia or appetite change Derm: Denies rash, dry skin, scaling or peeling skin change Heme: Denies easy  bruising, bleeding, bleeding gums Neuro: Denies headache, numbness, weakness, slurred speech, loss of memory or consciousness  Past Medical History:  He,  has a past medical history of Chronic airway obstruction (HCC), DDD (degenerative disc disease), lumbar, Dupuytren's disease, Dyslipidemia (04/07/2014), Esophageal reflux (04/20/2014), Hypertension (04/20/2014), Microalbuminuria (04/07/2014), Microalbuminuria, Obesity, unspecified (04/07/2014), Sleep apnea (04/20/2014), and 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   Surgical History:   Past Surgical History:  Procedure Laterality Date   BACK SURGERY  1992, 2005   CLOSED REDUCTION FINGER WITH PERCUTANEOUS PINNING Left 02/12/2020   Procedure: Closed reduction and pinning of left first metacarpal fracture with possible open reduction;  Surgeon: Hessie Knows, MD;  Location: [facility] ORS;  Service: Orthopedics;  Laterality: Left;   COLONOSCOPY WITH PROPOFOL N/A 12/28/2017   Procedure: COLONOSCOPY WITH PROPOFOL;  Surgeon: Manya Silvas, MD;  Location: [facility] ENDOSCOPY;  Service: Endoscopy;  Laterality: N/A;   NASAL SEPTUM SURGERY     RADIOACTIVE SEED IMPLANT N/A 04/12/2021   Procedure: RADIOACTIVE SEED IMPLANT/BRACHYTHERAPY IMPLANT;  Surgeon: Abbie Sons, MD;  Location: [facility] ORS;  Service: Urology;  Laterality: N/A;  73 seeds implanted   VASECTOMY  2005     Social History:   reports that he has quit smoking. He has never used smokeless tobacco. He reports that he does not currently use alcohol. He reports that he does not currently use drugs.   Family History:  His family history is negative for Prostate cancer and Kidney cancer.   Allergies No Known Allergies   Home Medications  Prior to Admission medications   Medication Sig Start Date End Date Taking? Authorizing Provider  albuterol (VENTOLIN HFA) 108 (90 Base) MCG/ACT inhaler Inhale 2 puffs into the lungs every 6 (six) hours as needed for wheezing or shortness  of breath. 01/20/20  Yes [provider]  ascorbic acid (VITAMIN C) 1000 MG tablet Take 0.5 tablets (500 mg total) by mouth daily. 05/14/21  Yes Wieting, Richard, MD  cholecalciferol (VITAMIN D) 25 MCG (1000 UNIT) tablet Take 1,000 Units by mouth in the morning.   Yes [provider]  fluticasone-salmeterol (ADVAIR HFA) 115-21 MCG/ACT inhaler Inhale 2 puffs into the lungs 2 (two) times daily.   Yes [provider]  furosemide (LASIX) 20 MG tablet Take 20 mg by mouth daily. 05/26/21  Yes [provider]  gabapentin (NEURONTIN) 300 MG capsule Take 300 mg by mouth 3 (three) times daily. 05/26/21  Yes [provider]  insulin aspart (NOVOLOG) 100 UNIT/ML FlexPen Inject 8 Units into the skin 3 (three) times daily with meals. Okay to substitute generic 05/13/21  Yes Wieting, Richard, MD  losartan (COZAAR) 50 MG tablet Take 50 mg by mouth in the morning. 10/07/17  Yes [provider]  OZEMPIC, 1 MG/DOSE, 4 MG/3ML SOPN Inject 1 mg into the skin every Sunday. 07/29/19  Yes [provider]  pantoprazole (PROTONIX) 40 MG tablet Take 40 mg by mouth 2 (two) times daily. 10/19/17  Yes [provider]  pioglitazone (ACTOS) 30 MG tablet Take 30 mg by mouth in the morning. 07/27/19  Yes [provider]  rosuvastatin (CRESTOR) 5 MG tablet Take 5 mg by mouth every Monday, Wednesday, and Friday. In the morning 04/19/18  Yes [provider]  tamsulosin (FLOMAX) 0.4 MG CAPS capsule Take 1 capsule (0.4 mg total) by mouth daily. 03/25/21  Yes Vaillancourt, Samantha, PA-C  TOUJEO MAX SOLOSTAR 300 UNIT/ML Solostar Pen Inject 50 Units into the skin in the morning. Patient taking differently: Inject 40 Units into the skin in the morning. 05/13/21  Yes Wieting, Richard, MD  XIGDUO XR 12-998 MG TB24 Take 2 tablets by mouth every morning. 01/31/21  Yes [provider]  zinc sulfate 220 (50 Zn) MG capsule Take 1 capsule (220 mg total) by mouth  daily. 05/14/21  Yes Wieting, Richard, MD  aspirin EC 81 MG tablet Take 81 mg by mouth in the morning. Swallow whole. Patient not taking: Reported on 05/30/2021    [provider]  chlorpheniramine-HYDROcodone (TUSSIONEX) 10-8 MG/5ML SUER Take 5 mLs by mouth every 12 (twelve) hours as needed for cough. Patient not taking: No sig reported 05/13/21   Loletha Grayer, MD  feeding supplement, GLUCERNA SHAKE, (GLUCERNA SHAKE) LIQD Take 237 mLs by mouth 2 (two) times daily between meals. 05/13/21   Loletha Grayer, MD  fluticasone-salmeterol (ADVAIR HFA) 893-81 MCG/ACT inhaler Inhale 2 puffs into the lungs 2 (two) times daily. Rinse mouth out with water after use Patient not taking: No sig reported 05/13/21   Loletha Grayer, MD  Insulin Pen Needle 33G X 4 MM MISC 1 Dose by Does not apply route 3 (three) times daily before meals. 05/13/21   Loletha Grayer, MD  predniSONE (DELTASONE) 10 MG tablet Three tabs po daily for five days Patient not taking: No sig reported 05/13/21   Loletha Grayer, MD       Ottie Glazier, M.D.  Pulmonary & Critical Care Medicine

## 2021-06-17 NOTE — Progress Notes (Signed)
Report given to [facility], states that they will be arriving in 25-30 mins

## 2021-06-17 NOTE — Discharge Summary (Signed)
Physician Discharge Summary  Dylan Pittman YSA:[id number] DOB: 1950-12-27 DOA: 05/30/2021  PCP: Sofie Hartigan, MD  Admit date: 05/30/2021 Discharge date: 06/17/2021  Admitted From: Home Disposition: [facility] to the care of Dr. Elijio Miles  Recommendations for Outpatient Follow-up:  Follow up with PCP in 1-2 weeks Follow up with Pulmonary within 1-2 weeks  Please obtain CMP/CBC, Mag, Phos in one week Please follow up on the following pending results:  Home Health: No  Equipment/Devices: None    Discharge Condition: Stable  CODE STATUS: FULL CODE  Diet recommendation:   Brief/Interim Summary: The patient is a 70 year old Caucasian male with a past medical history significant for but limited to pulmonary fibrosis, hypertension, hyperlipidemia, diabetes mellitus type 2, obstructive sleep apnea, history of prostate cancer status post seed ablation and Lupron, BPH, GERD as well as other comorbidities who presented to [facility] ED from home on 05/30/2021 with progressive hypoxia at rest and severe dyspnea on exertion.  Patient was recently hospitalized with COVID-pneumonia on 05/09/2021 and discharged on 05/14/2019.  He states that his dyspnea only worsened after being discharged home with increasing oxygen support requirements every day.  He noticed that his oxygen saturations were dropping the 50 to 60% range with exertion.  He was admitted on 05/30/2021 and required heated high flow nasal cannula and started weaning his heated high flow Nasal cannula on 06/02/2021.  On 06/04/2021 he continued to wear nasal cannula at 10 L with a Salter and then weaned to 7 L on 06/08/2021.  He is very slow to improve but he is symptomatically improving but continues to desaturate with exertion. Now O2 ranging from 4-6 Liters at Rest and Desaturates to the 60's on O2 with any Motion or Activity. Given his slow improvement Pulmonary discussed LTAC and patient is agreeable and has a bed available and will be D/C'd today to  the care of Dr. Elijio Miles.   Discharge Diagnoses:  Principal Problem:   Acute on chronic respiratory failure with hypoxia (HCC) Active Problems:   Dyslipidemia   Hypertension   Sleep apnea   Type 2 diabetes mellitus with hyperlipidemia (HCC)   Benign prostatic hyperplasia without lower urinary tract symptoms   ILD (interstitial lung disease) (HCC)  Acute on chronic hypoxic respiratory failure - Post CoViD ILD / Pulm Fibrosis - ILD w/ acute exacerbation - possible Legionella  -Care per Pulmonary -weaning oxygen as able - pulm weaning steroids, now transitioned to oral dexamethasone 6 mg p.o twice daily.  -Slowly weaning o2, down to 4 Liters today but with any motion or activity he desaturates significantly  -SpO2: 90 % O2 Flow Rate (L/min): 4 L/min FiO2 (%): 50 %  -Is s/p 10 days azith for possible Legionella.  -Continuing pulm toilet, inhalers. Bactrim started for pneumocystis ppx.  -Pulm thinks could be weeks until stable enough to return home, TOC is exploring LTACH options and per Patient's discussion with Pulmonary he would be ok with going to [facility].  -Pulmonary recommends continuing supplemental oxygen to maintain O2 saturation greater than 88% and continue heated high flow nasal cannula if needed and wean FiO2 as tolerated; they are considering transitioning to BiPAP overnight due to his OSA -Pulmonary recommends intermittent chest x-ray and an ABG as needed -They are also recommending ensuring pulmonary hygiene and recommending incentive spirometry every hour x10 while awake -Patient has Ativan prn for anxiety. Subjectively improved today. -C/w Furosemide 20 mg po Daily but may need some more -C/w Breo Ellipta 200-25 mcg/INH 1 puff IH and c/w  DuoNeb 3 mL q6h as well Albuterol 2.5 mg Neb q2hprn Wheezing and SOB -C/w Tussionex 5 mg po q12hprn Cough and Robitussin DM 5 mg po q4hprn  -Repeat chest x-ray yesterday showed "Patchy interstitial opacities with background of coarsened  interstitial markings throughout both lungs, slightly worsened within the bibasilar and left perihilar regions" and continue ambulation and mobilization   DM2 with Hyperglycemia -CBG moderate elevations fasting, pre-prandial wnl.  -Increased Semglee from 46 to 54 bid and Increased mealtime Novolog Insulin from 10 to 12 -CBGs ranging from 152-326   Anal Fissure v/s small hemorrhoid  -Scant BRBPR resolved -Keep lubricated w/ vaseline or neosporin - trial of anusol suppository  -keep stools loose - monitor    BPH -Continue usual home medical therapy with Tamsulosin 0.4 mg po Daily   HLD -Continue usual home medical therapy with Rosuvastatin 5 mg po MWF    HTN -Wnl, home losartan on hold; C/w Furosemide as above  -Continue to Monitor BP per Protocol  -Last BP was 121/69   Leukocytosis -Mild and in the setting of Steroid Demargination -WBC went from 9.8 -> 10.2 -> 12.3 -> 10.4 -> 11.7 -> 9.6 -> 10.1 -Continue to Monitor and Trend  -Repeat CBC in the AM   GERD/GI Prophylaxis  -C/w Pantoprazole 40 mg po BID   Discharge Instructions Discharge Instructions     (HEART FAILURE PATIENTS) Call MD:  Anytime you have any of the following symptoms: 1) 3 pound weight gain in 24 hours or 5 pounds in 1 week 2) shortness of breath, with or without a dry hacking cough 3) swelling in the hands, feet or stomach 4) if you have to sleep on extra pillows at night in order to breathe.   Complete by: As directed    Call MD for:  difficulty breathing, headache or visual disturbances   Complete by: As directed    Call MD for:  extreme fatigue   Complete by: As directed    Call MD for:  hives   Complete by: As directed    Call MD for:  persistant dizziness or light-headedness   Complete by: As directed    Call MD for:  persistant nausea and vomiting   Complete by: As directed    Call MD for:  redness, tenderness, or signs of infection (pain, swelling, redness, odor or green/yellow discharge around  incision site)   Complete by: As directed    Call MD for:  severe uncontrolled pain   Complete by: As directed    Call MD for:  temperature >100.4   Complete by: As directed    Diet - low sodium heart healthy   Complete by: As directed    Discharge instructions   Complete by: As directed    You were cared for by a hospitalist during your hospital stay. If you have any questions about your discharge medications or the care you received while you were in the hospital after you are discharged, you can call the unit and ask to speak with the hospitalist on call if the hospitalist that took care of you is not available. Once you are discharged, your primary care physician will handle any further medical issues. Please note that NO REFILLS for any discharge medications will be authorized once you are discharged, as it is imperative that you return to your primary care physician (or establish a relationship with a primary care physician if you do not have one) for your aftercare needs so that they can reassess  your need for medications and monitor your lab values.  Follow up with PCP and Pulmonary. Take all medications as prescribed. If symptoms change or worsen please return to the ED for evaluation   Patient will have further care at [facility] per their Protocols   Increase activity slowly   Complete by: As directed       Allergies as of 06/17/2021   No Known Allergies      Medication List     STOP taking these medications    aspirin EC 81 MG tablet   insulin aspart 100 UNIT/ML FlexPen Commonly known as: NOVOLOG Replaced by: insulin aspart 100 UNIT/ML injection   Insulin Pen Needle 33G X 4 MM Misc   Ozempic (1 MG/DOSE) 4 MG/3ML Sopn Generic drug: Semaglutide (1 MG/DOSE)   pioglitazone 30 MG tablet Commonly known as: ACTOS   Toujeo Max SoloStar 300 UNIT/ML Solostar Pen Generic drug: insulin glargine (2 Unit Dial)   Xigduo XR 12-998 MG Tb24 Generic drug: Dapagliflozin-metFORMIN  HCl ER       TAKE these medications    albuterol 108 (90 Base) MCG/ACT inhaler Commonly known as: VENTOLIN HFA Inhale 2 puffs into the lungs every 6 (six) hours as needed for wheezing or shortness of breath.   ascorbic acid 1000 MG tablet Commonly known as: VITAMIN C Take 0.5 tablets (500 mg total) by mouth daily.   chlorpheniramine-HYDROcodone 10-8 MG/5ML Suer Commonly known as: TUSSIONEX Take 5 mLs by mouth every 12 (twelve) hours as needed for cough.   cholecalciferol 25 MCG (1000 UNIT) tablet Commonly known as: VITAMIN D Take 1,000 Units by mouth in the morning.   dexamethasone 6 MG tablet Commonly known as: DECADRON Take 1 tablet (6 mg total) by mouth every 12 (twelve) hours.   docusate sodium 100 MG capsule Commonly known as: COLACE Take 1 capsule (100 mg total) by mouth 2 (two) times daily as needed for mild constipation.   feeding supplement (GLUCERNA SHAKE) Liqd Take 237 mLs by mouth 2 (two) times daily between meals.   fluticasone-salmeterol 115-21 MCG/ACT inhaler Commonly known as: ADVAIR HFA Inhale 2 puffs into the lungs 2 (two) times daily.   furosemide 20 MG tablet Commonly known as: LASIX Take 20 mg by mouth daily.   gabapentin 300 MG capsule Commonly known as: NEURONTIN Take 300 mg by mouth 3 (three) times daily.   guaiFENesin-dextromethorphan 100-10 MG/5ML syrup Commonly known as: ROBITUSSIN DM Take 5 mLs by mouth every 4 (four) hours as needed for cough.   insulin aspart 100 UNIT/ML injection Commonly known as: novoLOG Inject 12 Units into the skin 3 (three) times daily with meals. Replaces: insulin aspart 100 UNIT/ML FlexPen   insulin aspart 100 UNIT/ML injection Commonly known as: novoLOG Inject 0-20 Units into the skin 4 (four) times daily -  before meals and at bedtime.   insulin glargine-yfgn 100 UNIT/ML injection Commonly known as: SEMGLEE Inject 0.54 mLs (54 Units total) into the skin 2 (two) times daily.   ipratropium-albuterol  0.5-2.5 (3) MG/3ML Soln Commonly known as: DUONEB Take 3 mLs by nebulization every 6 (six) hours.   lidocaine 4 % (PF) injection Commonly known as: XYLOCAINE Inhale 5 mLs into the lungs every 4 (four) hours as needed (cough).   LORazepam 0.5 MG tablet Commonly known as: ATIVAN Take 1 tablet (0.5 mg total) by mouth every 6 (six) hours as needed for anxiety.   losartan 50 MG tablet Commonly known as: COZAAR Take 50 mg by mouth in the morning.  neomycin-bacitracin-polymyxin ointment Commonly known as: NEOSPORIN Apply topically 2 (two) times daily.   pantoprazole 40 MG tablet Commonly known as: PROTONIX Take 40 mg by mouth 2 (two) times daily.   phenol 1.4 % Liqd Commonly known as: CHLORASEPTIC Use as directed 1 spray in the mouth or throat as needed for throat irritation / pain.   polyethylene glycol 17 g packet Commonly known as: MIRALAX / GLYCOLAX Take 17 g by mouth daily as needed for moderate constipation.   polyvinyl alcohol 1.4 % ophthalmic solution Commonly known as: LIQUIFILM TEARS Place 1 drop into both eyes as needed for dry eyes.   rosuvastatin 5 MG tablet Commonly known as: CRESTOR Take 5 mg by mouth every Monday, Wednesday, and Friday. In the morning   sodium chloride 0.65 % Soln nasal spray Commonly known as: OCEAN Place 1 spray into both nostrils as needed for congestion.   sulfamethoxazole-trimethoprim 800-160 MG tablet Commonly known as: BACTRIM DS Take 1 tablet by mouth 3 (three) times a week. Start taking on: June 20, 2021   tamsulosin 0.4 MG Caps capsule Commonly known as: FLOMAX Take 1 capsule (0.4 mg total) by mouth daily.   zinc sulfate 220 (50 Zn) MG capsule Take 1 capsule (220 mg total) by mouth daily.        No Known Allergies  Consultations: Pulmonary  Procedures/Studies: DG Chest 2 View  Result Date: 05/30/2021 CLINICAL DATA:  Pt was pos on sept 11th for covid, pt was admitted sept 12th with low o2 sats, pt has been  admitted and discharged, diagnosed with covid pneumonia, pt took 5 days of antibiotics and steroids, pt is currently on 7L of O2 and states that he just doesn't feel like he's getting better, pt reports even standing with urinal while using O2 his O2 drops in the 60's. Pt is unable to stand and has extreme shortness of breath. EXAM: CHEST - 2 VIEW COMPARISON:  05/09/2021 and older studies. CT of the chest 05/27/2021. FINDINGS: Cardiac silhouette is normal in size. No mediastinal or hilar masses. Adenopathy noted on the recent CTA chest is not defined radiographically. Lungs demonstrate bilateral interstitial and hazy airspace opacities, similar to the prior chest radiograph from 05/09/2021. No new lung abnormalities. No pleural effusion or pneumothorax. Skeletal structures are intact. IMPRESSION: 1. Bilateral interstitial and hazy airspace lung opacities are without significant change compared to the prior chest radiograph, consistent with persistent multifocal pneumonia. No new chest radiographic abnormalities. Electronically Signed   By: Lajean Manes M.D.   On: 05/30/2021 15:34   CT Angio Chest PE W and/or Wo Contrast  Result Date: 05/30/2021 CLINICAL DATA:  COVID pneumonia, hypoxia EXAM: CT ANGIOGRAPHY CHEST WITH CONTRAST TECHNIQUE: Multidetector CT imaging of the chest was performed using the standard protocol during bolus administration of intravenous contrast. Multiplanar CT image reconstructions and MIPs were obtained to evaluate the vascular anatomy. CONTRAST:  59mL OMNIPAQUE IOHEXOL 350 MG/ML SOLN COMPARISON:  CT 05/27/2021 FINDINGS: Cardiovascular: Satisfactory opacification of the pulmonary arteries to the segmental branch level. No filling defect identified to suggest pulmonary embolism. Thoracic aorta is nonaneurysmal. Atherosclerotic calcifications of the aorta and coronary arteries. Heart size is within normal limits. No pericardial effusion. Mediastinum/Nodes: Redemonstrated multiple enlarged  mediastinal and bilateral hilar lymph nodes. Reference nodes include lower right paratracheal node measuring 1.6 cm (series 5, image 100), right hilar node measuring 1.7 cm (series 5, image 139), and left hilar node measuring 1.4 cm (series 5, image 177). No appreciable interval change compared to the previous  CT. No axillary lymphadenopathy. Thyroid, trachea, and esophagus within normal limits. Lungs/Pleura: Persistent extensive widespread areas of predominantly ground-glass opacity throughout both lungs. No significant interval change compared to the previous CT. Background of chronic interstitial lung disease with areas of bronchiectasis and honeycombing. No pleural effusion or pneumothorax. Upper Abdomen: No acute findings within the included upper abdomen. Musculoskeletal: No chest wall abnormality. No acute or significant osseous findings. Review of the MIP images confirms the above findings. IMPRESSION: 1. No evidence of acute pulmonary embolism. 2. Persistent extensive widespread areas of predominantly ground-glass opacity throughout both lungs. No significant interval change compared to the previous CT. 3. Redemonstrated multiple enlarged mediastinal and bilateral hilar lymph nodes, which may be reactive. 4. Background of chronic interstitial lung disease. A follow-up high-resolution CT of the chest is recommended in 2-3 months following the resolution of patient's acute illness. Aortic Atherosclerosis (ICD10-I70.0). Electronically Signed   By: Davina Poke D.O.   On: 05/30/2021 17:25   CT Angio Chest Pulmonary Embolism (PE) W or WO Contrast  Result Date: 05/27/2021 CLINICAL DATA:  70 year old male with recent diagnosis of COVID pneumonia 1 week ago. Shortness of breath. Cough. Elevated D-dimer. EXAM: CT ANGIOGRAPHY CHEST WITH CONTRAST TECHNIQUE: Multidetector CT imaging of the chest was performed using the standard protocol during bolus administration of intravenous contrast. Multiplanar CT image  reconstructions and MIPs were obtained to evaluate the vascular anatomy. CONTRAST:  54mL OMNIPAQUE IOHEXOL 350 MG/ML SOLN COMPARISON:  Chest CT 10/20/2016. FINDINGS: Comment: Today's study is limited by considerable patient respiratory motion and suboptimal contrast bolus such that accurate assessment for segmental and subsegmental sized emboli is not possible on today's examination. Cardiovascular: Limited study demonstrating no central or lobar sized embolism. Accurate assessment for segmental and subsegmental sized emboli is not possible on today's examination. Heart size is normal. There is no significant pericardial fluid, thickening or pericardial calcification. There is aortic atherosclerosis, as well as atherosclerosis of the great vessels of the mediastinum and the coronary arteries, including calcified atherosclerotic plaque in the left main, left anterior descending, left circumflex and right coronary arteries. Mediastinum/Nodes: There are multiple prominent borderline enlarged and mildly enlarged mediastinal and bilateral hilar lymph nodes, largest of which is in the right paratracheal nodal station measuring 1.7 cm in short axis. Esophagus is unremarkable in appearance. No axillary lymphadenopathy. Lungs/Pleura: Widespread but patchy areas of ground-glass attenuation, peribronchovascular airspace consolidation, septal thickening, subpleural reticulation and traction bronchiectasis are noted in the lungs bilaterally. There is a suggestion of potential early honeycombing in the periphery of the upper lobes of the lungs, but this is not definitive. No pleural effusions. Probable air trapping in the anterior aspect of the basal left lower lobe. Upper Abdomen: Unremarkable. Musculoskeletal: There are no aggressive appearing lytic or blastic lesions noted in the visualized portions of the skeleton. Review of the MIP images confirms the above findings. IMPRESSION: 1. Severely limited examination demonstrating  no definite central or lobar pulmonary embolism. Smaller segmental and subsegmental sized emboli cannot be entirely excluded secondary to a combination of a large amount of respiratory motion and suboptimal contrast bolus. 2. The appearance of the lungs is complex, as discussed above. There are some imaging findings indicative of a chronic interstitial lung disease, which appear progressive compared to remote prior chest CT 10/20/2016. In addition, the possibility of severe multilobar bronchopneumonia is strongly suspected, particularly in light of the patient's known diagnosis of COVID infection. Follow-up high-resolution chest CT is recommended in 2-3 months after resolution of the patient's acute  illness to assess for the true parenchymal pattern of fibrosis and the extent of non reversible disease. 3. Multiple borderline enlarged and mildly enlarged mediastinal and bilateral hilar lymph nodes are potentially chronic in the setting of interstitial lung disease, or reactive in the setting of acute infection. Attention at time of repeat high-resolution chest CT is recommended to ensure the stability or regression of these findings. 4. Aortic atherosclerosis, in addition to left main and 3 vessel coronary artery disease. Please note that although the presence of coronary artery calcium documents the presence of coronary artery disease, the severity of this disease and any potential stenosis cannot be assessed on this non-gated CT examination. Assessment for potential risk factor modification, dietary therapy or pharmacologic therapy may be warranted, if clinically indicated. Aortic Atherosclerosis (ICD10-I70.0). Electronically Signed   By: Vinnie Langton M.D.   On: 05/27/2021 15:12   DG Chest Port 1 View  Result Date: 06/16/2021 CLINICAL DATA:  Short of breath EXAM: PORTABLE CHEST 1 VIEW COMPARISON:  06/11/2021 FINDINGS: Heart size within normal limits. Low lung volumes. Patchy interstitial opacities with  background of coarsened interstitial markings throughout both lungs, slightly worsened within the bibasilar and left perihilar regions. No significant pleural fluid collection. No pneumothorax. IMPRESSION: Patchy interstitial opacities with background of coarsened interstitial markings throughout both lungs, slightly worsened within the bibasilar and left perihilar regions. Electronically Signed   By: Davina Poke D.O.   On: 06/16/2021 07:32   DG Chest Port 1 View  Result Date: 06/11/2021 CLINICAL DATA:  Progressive hypoxia at rest and with exertion. Recent hospitalization with COVID pneumonia. EXAM: PORTABLE CHEST 1 VIEW COMPARISON:  Chest x-rays dated 06/06/2021 and 06/01/2021. FINDINGS: Diffuse bilateral airspace opacities are unchanged. No pleural effusion or pneumothorax is seen. Heart size and mediastinal contours are stable. IMPRESSION: Stable chest x-ray. Stable appearance of the diffuse bilateral airspace opacities, most likely multifocal pneumonia superimposed on chronic interstitial lung disease/fibrosis. Electronically Signed   By: Franki Cabot M.D.   On: 06/11/2021 11:29   DG Chest Port 1 View  Result Date: 06/06/2021 CLINICAL DATA:  Interstitial edema EXAM: PORTABLE CHEST 1 VIEW COMPARISON:  Chest radiograph 06/01/2021 FINDINGS: The cardiomediastinal silhouette is stable. Extensive patchy and interstitial markings are again seen throughout both lungs. Compared to the study from 06/01/2021, the appearance is not significantly changed. There is no new or enlarging pleural effusion. There is no appreciable pneumothorax. The bones are stable. IMPRESSION: Overall no significant interval change since 06/01/2021. Patchy and interstitial markings throughout both lungs may reflect pulmonary interstitial/alveolar edema, infection, or a combination thereof superimposed on chronic lung disease. Electronically Signed   By: Valetta Mole M.D.   On: 06/06/2021 08:33   DG Chest Port 1 View  Result  Date: 06/01/2021 CLINICAL DATA:  Short of breath. EXAM: PORTABLE CHEST 1 VIEW COMPARISON:  05/30/2021 and older exams. FINDINGS: Bilateral interstitial and hazy airspace lung opacities are noted, similar to the prior exam allowing for differences in patient positioning, lung volume and technique. No convincing new lung abnormalities. No pleural effusion or pneumothorax. Cardiac silhouette is normal in size. Skeletal structures are grossly intact. IMPRESSION: 1. No significant change from the prior chest radiograph. 2. Interstitial hazy airspace lung opacities. Findings likely a combination of chronic interstitial lung disease and multifocal infection. Electronically Signed   By: Lajean Manes M.D.   On: 06/01/2021 16:04   ECHOCARDIOGRAM COMPLETE  Result Date: 06/04/2021    ECHOCARDIOGRAM REPORT   Patient Name:   PHELAN SCHADT Beutler Date  of Exam: 06/04/2021 Medical Rec #:  [id number]      Height:       68.0 in Accession #:    [id number]     Weight:       216.9 lb Date of Birth:  [birth year]      BSA:          2.116 m Patient Age:    60 years       BP:           113/76 mmHg Patient Gender: M              HR:           101 bpm. Exam Location:  [facility] Procedure: 2D Echo Indications:     CHF- I50.21  History:         Patient has no prior history of Echocardiogram examinations.  Sonographer:     Kathlen Brunswick RDCS Referring Phys:  [id number] Ottie Glazier Diagnosing Phys: Kate Sable MD  Sonographer Comments: Technically difficult study due to poor echo windows. Image acquisition challenging due to respiratory motion. IMPRESSIONS  1. Left ventricular ejection fraction, by estimation, is 60 to 65%. The left ventricle has normal function. The left ventricle has no regional wall motion abnormalities. There is mild left ventricular hypertrophy. Left ventricular diastolic parameters were normal.  2. Right ventricular systolic function is normal. The right ventricular size is normal.  3. The mitral valve is normal in  structure. No evidence of mitral valve regurgitation.  4. The aortic valve is tricuspid. Aortic valve regurgitation is not visualized. FINDINGS  Left Ventricle: Left ventricular ejection fraction, by estimation, is 60 to 65%. The left ventricle has normal function. The left ventricle has no regional wall motion abnormalities. The left ventricular internal cavity size was normal in size. There is  mild left ventricular hypertrophy. Left ventricular diastolic parameters were normal. Right Ventricle: The right ventricular size is normal. No increase in right ventricular wall thickness. Right ventricular systolic function is normal. Left Atrium: Left atrial size was normal in size. Right Atrium: Right atrial size was normal in size. Pericardium: There is no evidence of pericardial effusion. Mitral Valve: The mitral valve is normal in structure. No evidence of mitral valve regurgitation. Tricuspid Valve: The tricuspid valve is normal in structure. Tricuspid valve regurgitation is not demonstrated. Aortic Valve: The aortic valve is tricuspid. Aortic valve regurgitation is not visualized. Aortic valve peak gradient measures 5.9 mmHg. Pulmonic Valve: The pulmonic valve was not well visualized. Pulmonic valve regurgitation is not visualized. Aorta: The aortic root is normal in size and structure. Venous: The inferior vena cava was not well visualized. IAS/Shunts: No atrial level shunt detected by color flow Doppler.  LEFT VENTRICLE PLAX 2D LVIDd:         3.86 cm   Diastology LVIDs:         2.63 cm   LV e' medial:    5.22 cm/s LV PW:         1.20 cm   LV E/e' medial:  14.9 LV IVS:        1.19 cm   LV e' lateral:   8.49 cm/s LVOT diam:     2.20 cm   LV E/e' lateral: 9.2 LV SV:         51 LV SV Index:   24 LVOT Area:     3.80 cm  RIGHT VENTRICLE RV Basal diam:  2.71 cm RV S prime:     6.85 cm/s  TAPSE (M-mode): 1.1 cm LEFT ATRIUM           Index        RIGHT ATRIUM          Index LA diam:      3.60 cm 1.70 cm/m   RA Area:      8.58 cm LA Vol (A2C): 28.3 ml 13.38 ml/m  RA Volume:   15.60 ml 7.37 ml/m LA Vol (A4C): 14.4 ml 6.81 ml/m  AORTIC VALVE                 PULMONIC VALVE AV Area (Vmax): 2.38 cm     PV Vmax:       0.92 m/s AV Vmax:        121.00 cm/s  PV Peak grad:  3.4 mmHg AV Peak Grad:   5.9 mmHg LVOT Vmax:      75.70 cm/s LVOT Vmean:     47.700 cm/s LVOT VTI:       0.133 m  AORTA Ao Root diam: 3.10 cm Ao Asc diam:  3.90 cm MITRAL VALVE MV Area (PHT): 4.96 cm    SHUNTS MV Decel Time: 153 msec    Systemic VTI:  0.13 m MV E velocity: 78.00 cm/s  Systemic Diam: 2.20 cm MV A velocity: 68.10 cm/s MV E/A ratio:  1.15 Kate Sable MD Electronically signed by Kate Sable MD Signature Date/Time: 06/04/2021/1:36:14 PM    Final      Subjective: Seen and examined at bedside and he is slowly improving. No CP and SOB is stable but desaturates with any exertion or ambulation. Feels ok. No lightheadedness or dizziness. No other concerns and tries to do multiple laps around the unit.   Discharge Exam: Vitals:   06/17/21 0754 06/17/21 1149  BP:  121/69  Pulse:  (!) 103  Resp:  20  Temp:  97.9 F (36.6 C)  SpO2: 96% 90%   Vitals:   06/17/21 0554 06/17/21 0729 06/17/21 0754 06/17/21 1149  BP: 134/72 131/82  121/69  Pulse: 82 78  (!) 103  Resp: 20 16  20   Temp: 97.6 F (36.4 C) 98.7 F (37.1 C)  97.9 F (36.6 C)  TempSrc: Oral Oral  Oral  SpO2: 96% 99% 96% 90%  Weight:      Height:       General: Pt is alert, awake, not in acute distress Cardiovascular: RRR, S1/S2 +, no rubs, no gallops Respiratory: Diminished bilaterally with coarse breath sounds, no wheezing, no rhonchi; Wearing Supplemental O2 via [place] Abdominal: Soft, NT, Distended 2/2 body habitus, bowel sounds + Extremities: 1+ LE edema, no cyanosis  The results of significant diagnostics from this hospitalization (including imaging, microbiology, ancillary and laboratory) are listed below for reference.    Microbiology: No results found for this  or any previous visit (from the past 240 hour(s)).   Labs: BNP (last 3 results) Recent Labs    05/09/21 0618 05/30/21 1452  BNP 21.6 19.3   Basic Metabolic Panel: Recent Labs  Lab 06/13/21 0747 06/14/21 0443 06/15/21 0446 06/16/21 0505 06/17/21 0226  NA 138 136 133* 137 137  K 5.1 4.4 4.2 4.7 4.1  CL 93* 95* 95* 100 100  CO2 35* 32 30 32 30  GLUCOSE 192* 244* 217* 219* 248*  BUN 20 20 23 22 23   CREATININE 0.76 0.77 0.65 0.68 0.60*  CALCIUM 9.5 8.5* 8.3* 8.3* 8.3*  MG 2.2  --  2.2 2.2 2.0  PHOS 5.8*  --  5.1* 4.2 3.8   Liver Function Tests: Recent Labs  Lab 06/15/21 0446 06/16/21 0505 06/17/21 0226  AST 22 20 17   ALT 24 24 24   ALKPHOS 81 73 83  BILITOT 0.6 0.7 0.5  PROT 6.5 6.3* 6.5  ALBUMIN 3.1* 2.9* 3.3*   No results for input(s): LIPASE, AMYLASE in the last 168 hours. No results for input(s): AMMONIA in the last 168 hours. CBC: Recent Labs  Lab 06/12/21 0435 06/15/21 0446 06/16/21 0505 06/17/21 0226  WBC 10.4 11.7* 9.6 10.1  NEUTROABS  --  10.2* 8.4* 8.8*  HGB 12.6* 13.1 12.5* 12.7*  HCT 38.5* 38.9* 35.9* 37.8*  MCV 93.7 93.5 93.7 93.8  PLT 215 249 200 205   Cardiac Enzymes: No results for input(s): CKTOTAL, CKMB, CKMBINDEX, TROPONINI in the last 168 hours. BNP: Invalid input(s): POCBNP CBG: Recent Labs  Lab 06/16/21 1137 06/16/21 1633 06/16/21 2049 06/17/21 0730 06/17/21 1147  GLUCAP 326* 183* 281* 172* 274*   D-Dimer No results for input(s): DDIMER in the last 72 hours. Hgb A1c No results for input(s): HGBA1C in the last 72 hours. Lipid Profile No results for input(s): CHOL, HDL, LDLCALC, TRIG, CHOLHDL, LDLDIRECT in the last 72 hours. Thyroid function studies No results for input(s): TSH, T4TOTAL, T3FREE, THYROIDAB in the last 72 hours.  Invalid input(s): FREET3 Anemia work up No results for input(s): VITAMINB12, FOLATE, FERRITIN, TIBC, IRON, RETICCTPCT in the last 72 hours. Urinalysis    Component Value Date/Time    APPEARANCEUR Hazy (A) 04/21/2021 1430   GLUCOSEU 2+ (A) 04/21/2021 1430   BILIRUBINUR Negative 04/21/2021 1430   PROTEINUR Negative 04/21/2021 1430   NITRITE Negative 04/21/2021 1430   LEUKOCYTESUR Negative 04/21/2021 1430   Sepsis Labs Invalid input(s): PROCALCITONIN,  WBC,  LACTICIDVEN Microbiology No results found for this or any previous visit (from the past 240 hour(s)).  Time coordinating discharge: 35 minutes  SIGNED:  Kerney Elbe, DO [facility] 06/17/2021, 12:17 PM Pager is on [place]  If 7PM-7AM, please contact night-coverage [email or web address]

## 2021-06-17 NOTE — TOC Transition Note (Signed)
Transition of Care [facility]) - CM/SW Discharge Note   Patient Details  Name: Dylan Pittman MRN: [id number] Date of Birth: [birth year]  Transition of Care [facility]) CM/SW Contact:  Shelbie Hutching, RN Phone Number: 06/17/2021, 10:08 AM   Clinical Narrative:    Patient has been accepted at [facility] in [place].  Insurance authorization has been approved.  Patient can be admitted to [facility] today, going to room 315.  Bedside RN will call report to [phone] or [phone].  Accepting MD at [place] is Dr. Conley Rolls- Con Memos.  RNCM has arranged [facility] transport for this afternoon.  [facility] estimates time around 3pm but will come sooner if a truck is available sooner.   Patient notified and patient notified his wife.     Final next level of care: Long Term Acute Care (LTAC) Barriers to Discharge: Barriers Resolved   Patient Goals and CMS Choice Patient states their goals for this hospitalization and ongoing recovery are:: Patient agrees to LTAC at [facility].gov Compare Post Acute Care list provided to:: Patient Choice offered to / list presented to : Patient, Spouse  Discharge Placement              Patient chooses bed at: [place]) Patient to be transferred to facility by: Young Harris Name of family member notified: Caren Griffins Patient and family notified of of transfer: 06/17/21  Discharge Plan and Services   Discharge Planning Services: CM Consult Post Acute Care Choice: Long Term Acute Care (LTAC)          DME Arranged:  (No DME recommended, has home o2 with adapt, no issues)         HH Arranged: NA          Social Determinants of Health (SDOH) Interventions     Readmission Risk Interventions No flowsheet data found.

## 2021-06-20 DIAGNOSIS — J9621 Acute and chronic respiratory failure with hypoxia: Secondary | ICD-10-CM

## 2021-06-20 DIAGNOSIS — G4733 Obstructive sleep apnea (adult) (pediatric): Secondary | ICD-10-CM

## 2021-06-20 DIAGNOSIS — U071 COVID-19: Secondary | ICD-10-CM

## 2021-06-20 DIAGNOSIS — Z9981 Dependence on supplemental oxygen: Secondary | ICD-10-CM

## 2021-06-20 DIAGNOSIS — J8410 Pulmonary fibrosis, unspecified: Secondary | ICD-10-CM

## 2021-06-21 DIAGNOSIS — G4733 Obstructive sleep apnea (adult) (pediatric): Secondary | ICD-10-CM

## 2021-06-21 DIAGNOSIS — J8410 Pulmonary fibrosis, unspecified: Secondary | ICD-10-CM

## 2021-06-21 DIAGNOSIS — U071 COVID-19: Secondary | ICD-10-CM

## 2021-06-21 DIAGNOSIS — Z9981 Dependence on supplemental oxygen: Secondary | ICD-10-CM

## 2021-06-21 DIAGNOSIS — J9621 Acute and chronic respiratory failure with hypoxia: Secondary | ICD-10-CM

## 2021-06-22 DIAGNOSIS — J8410 Pulmonary fibrosis, unspecified: Secondary | ICD-10-CM

## 2021-06-22 DIAGNOSIS — G4733 Obstructive sleep apnea (adult) (pediatric): Secondary | ICD-10-CM

## 2021-06-22 DIAGNOSIS — J9621 Acute and chronic respiratory failure with hypoxia: Secondary | ICD-10-CM

## 2021-06-22 DIAGNOSIS — U071 COVID-19: Secondary | ICD-10-CM

## 2021-06-22 DIAGNOSIS — Z9981 Dependence on supplemental oxygen: Secondary | ICD-10-CM

## 2021-06-23 DIAGNOSIS — J8410 Pulmonary fibrosis, unspecified: Secondary | ICD-10-CM

## 2021-06-23 DIAGNOSIS — J9621 Acute and chronic respiratory failure with hypoxia: Secondary | ICD-10-CM

## 2021-06-23 DIAGNOSIS — U071 COVID-19: Secondary | ICD-10-CM

## 2021-06-23 DIAGNOSIS — G4733 Obstructive sleep apnea (adult) (pediatric): Secondary | ICD-10-CM

## 2021-07-01 DIAGNOSIS — J9621 Acute and chronic respiratory failure with hypoxia: Secondary | ICD-10-CM

## 2021-07-01 DIAGNOSIS — G4733 Obstructive sleep apnea (adult) (pediatric): Secondary | ICD-10-CM

## 2021-07-01 DIAGNOSIS — U071 COVID-19: Secondary | ICD-10-CM

## 2021-07-01 DIAGNOSIS — J8410 Pulmonary fibrosis, unspecified: Secondary | ICD-10-CM

## 2021-07-02 DIAGNOSIS — J8410 Pulmonary fibrosis, unspecified: Secondary | ICD-10-CM

## 2021-07-02 DIAGNOSIS — J9621 Acute and chronic respiratory failure with hypoxia: Secondary | ICD-10-CM

## 2021-07-02 DIAGNOSIS — G4733 Obstructive sleep apnea (adult) (pediatric): Secondary | ICD-10-CM

## 2021-07-02 DIAGNOSIS — U071 COVID-19: Secondary | ICD-10-CM

## 2021-07-03 DIAGNOSIS — J9621 Acute and chronic respiratory failure with hypoxia: Secondary | ICD-10-CM

## 2021-07-03 DIAGNOSIS — J8410 Pulmonary fibrosis, unspecified: Secondary | ICD-10-CM

## 2021-07-03 DIAGNOSIS — G4733 Obstructive sleep apnea (adult) (pediatric): Secondary | ICD-10-CM

## 2021-07-03 DIAGNOSIS — U071 COVID-19: Secondary | ICD-10-CM

## 2021-07-04 DIAGNOSIS — J8410 Pulmonary fibrosis, unspecified: Secondary | ICD-10-CM

## 2021-07-04 DIAGNOSIS — J9621 Acute and chronic respiratory failure with hypoxia: Secondary | ICD-10-CM

## 2021-07-04 DIAGNOSIS — U071 COVID-19: Secondary | ICD-10-CM

## 2021-07-04 DIAGNOSIS — G4733 Obstructive sleep apnea (adult) (pediatric): Secondary | ICD-10-CM

## 2021-07-05 DIAGNOSIS — J8410 Pulmonary fibrosis, unspecified: Secondary | ICD-10-CM

## 2021-07-05 DIAGNOSIS — G4733 Obstructive sleep apnea (adult) (pediatric): Secondary | ICD-10-CM

## 2021-07-05 DIAGNOSIS — U071 COVID-19: Secondary | ICD-10-CM

## 2021-07-05 DIAGNOSIS — J9621 Acute and chronic respiratory failure with hypoxia: Secondary | ICD-10-CM

## 2021-07-06 DIAGNOSIS — U071 COVID-19: Secondary | ICD-10-CM

## 2021-07-06 DIAGNOSIS — J9621 Acute and chronic respiratory failure with hypoxia: Secondary | ICD-10-CM

## 2021-07-06 DIAGNOSIS — J8410 Pulmonary fibrosis, unspecified: Secondary | ICD-10-CM

## 2021-07-06 DIAGNOSIS — G4733 Obstructive sleep apnea (adult) (pediatric): Secondary | ICD-10-CM

## 2021-07-07 DIAGNOSIS — U071 COVID-19: Secondary | ICD-10-CM

## 2021-07-07 DIAGNOSIS — J8410 Pulmonary fibrosis, unspecified: Secondary | ICD-10-CM

## 2021-07-07 DIAGNOSIS — J9621 Acute and chronic respiratory failure with hypoxia: Secondary | ICD-10-CM

## 2021-07-07 DIAGNOSIS — G4733 Obstructive sleep apnea (adult) (pediatric): Secondary | ICD-10-CM

## 2021-07-08 DIAGNOSIS — U071 COVID-19: Secondary | ICD-10-CM

## 2021-07-08 DIAGNOSIS — G4733 Obstructive sleep apnea (adult) (pediatric): Secondary | ICD-10-CM

## 2021-07-08 DIAGNOSIS — J8410 Pulmonary fibrosis, unspecified: Secondary | ICD-10-CM

## 2021-07-08 DIAGNOSIS — J9621 Acute and chronic respiratory failure with hypoxia: Secondary | ICD-10-CM

## 2021-07-09 DIAGNOSIS — U071 COVID-19: Secondary | ICD-10-CM

## 2021-07-09 DIAGNOSIS — J9621 Acute and chronic respiratory failure with hypoxia: Secondary | ICD-10-CM

## 2021-07-09 DIAGNOSIS — J8410 Pulmonary fibrosis, unspecified: Secondary | ICD-10-CM

## 2021-07-09 DIAGNOSIS — G4733 Obstructive sleep apnea (adult) (pediatric): Secondary | ICD-10-CM

## 2021-07-10 DIAGNOSIS — J9621 Acute and chronic respiratory failure with hypoxia: Secondary | ICD-10-CM

## 2021-07-10 DIAGNOSIS — J8410 Pulmonary fibrosis, unspecified: Secondary | ICD-10-CM

## 2021-07-10 DIAGNOSIS — U071 COVID-19: Secondary | ICD-10-CM

## 2021-07-10 DIAGNOSIS — G4733 Obstructive sleep apnea (adult) (pediatric): Secondary | ICD-10-CM

## 2021-07-18 DIAGNOSIS — J8410 Pulmonary fibrosis, unspecified: Secondary | ICD-10-CM

## 2021-07-18 DIAGNOSIS — G4733 Obstructive sleep apnea (adult) (pediatric): Secondary | ICD-10-CM

## 2021-07-18 DIAGNOSIS — J9621 Acute and chronic respiratory failure with hypoxia: Secondary | ICD-10-CM

## 2021-07-18 DIAGNOSIS — U071 COVID-19: Secondary | ICD-10-CM

## 2021-07-19 DIAGNOSIS — U071 COVID-19: Secondary | ICD-10-CM

## 2021-07-19 DIAGNOSIS — J9621 Acute and chronic respiratory failure with hypoxia: Secondary | ICD-10-CM

## 2021-07-19 DIAGNOSIS — G4733 Obstructive sleep apnea (adult) (pediatric): Secondary | ICD-10-CM

## 2021-07-19 DIAGNOSIS — J8410 Pulmonary fibrosis, unspecified: Secondary | ICD-10-CM

## 2021-07-21 ENCOUNTER — Emergency Department: Payer: BC Managed Care – PPO

## 2021-07-21 ENCOUNTER — Inpatient Hospital Stay: Payer: BC Managed Care – PPO

## 2021-07-21 ENCOUNTER — Inpatient Hospital Stay
Admission: EM | Admit: 2021-07-21 | Discharge: 2021-07-28 | DRG: 208 | Disposition: E | Payer: BC Managed Care – PPO | Attending: Internal Medicine | Admitting: Internal Medicine

## 2021-07-21 ENCOUNTER — Other Ambulatory Visit: Payer: Self-pay

## 2021-07-21 ENCOUNTER — Encounter: Payer: Self-pay | Admitting: *Deleted

## 2021-07-21 DIAGNOSIS — J8410 Pulmonary fibrosis, unspecified: Secondary | ICD-10-CM | POA: Diagnosis present

## 2021-07-21 DIAGNOSIS — J849 Interstitial pulmonary disease, unspecified: Secondary | ICD-10-CM

## 2021-07-21 DIAGNOSIS — E1165 Type 2 diabetes mellitus with hyperglycemia: Secondary | ICD-10-CM | POA: Diagnosis not present

## 2021-07-21 DIAGNOSIS — R040 Epistaxis: Secondary | ICD-10-CM | POA: Diagnosis not present

## 2021-07-21 DIAGNOSIS — G4733 Obstructive sleep apnea (adult) (pediatric): Secondary | ICD-10-CM | POA: Diagnosis present

## 2021-07-21 DIAGNOSIS — I5043 Acute on chronic combined systolic (congestive) and diastolic (congestive) heart failure: Secondary | ICD-10-CM | POA: Diagnosis present

## 2021-07-21 DIAGNOSIS — R0902 Hypoxemia: Secondary | ICD-10-CM

## 2021-07-21 DIAGNOSIS — E785 Hyperlipidemia, unspecified: Secondary | ICD-10-CM | POA: Diagnosis present

## 2021-07-21 DIAGNOSIS — Z6836 Body mass index (BMI) 36.0-36.9, adult: Secondary | ICD-10-CM

## 2021-07-21 DIAGNOSIS — Z6834 Body mass index (BMI) 34.0-34.9, adult: Secondary | ICD-10-CM | POA: Diagnosis not present

## 2021-07-21 DIAGNOSIS — Z7952 Long term (current) use of systemic steroids: Secondary | ICD-10-CM

## 2021-07-21 DIAGNOSIS — Z9981 Dependence on supplemental oxygen: Secondary | ICD-10-CM

## 2021-07-21 DIAGNOSIS — K219 Gastro-esophageal reflux disease without esophagitis: Secondary | ICD-10-CM | POA: Diagnosis present

## 2021-07-21 DIAGNOSIS — J206 Acute bronchitis due to rhinovirus: Secondary | ICD-10-CM | POA: Diagnosis present

## 2021-07-21 DIAGNOSIS — I5033 Acute on chronic diastolic (congestive) heart failure: Secondary | ICD-10-CM | POA: Diagnosis not present

## 2021-07-21 DIAGNOSIS — G4730 Sleep apnea, unspecified: Secondary | ICD-10-CM | POA: Diagnosis present

## 2021-07-21 DIAGNOSIS — Z794 Long term (current) use of insulin: Secondary | ICD-10-CM

## 2021-07-21 DIAGNOSIS — Z7951 Long term (current) use of inhaled steroids: Secondary | ICD-10-CM

## 2021-07-21 DIAGNOSIS — J9601 Acute respiratory failure with hypoxia: Secondary | ICD-10-CM

## 2021-07-21 DIAGNOSIS — C61 Malignant neoplasm of prostate: Secondary | ICD-10-CM | POA: Diagnosis present

## 2021-07-21 DIAGNOSIS — E119 Type 2 diabetes mellitus without complications: Secondary | ICD-10-CM | POA: Diagnosis not present

## 2021-07-21 DIAGNOSIS — I5021 Acute systolic (congestive) heart failure: Secondary | ICD-10-CM | POA: Diagnosis not present

## 2021-07-21 DIAGNOSIS — Z20822 Contact with and (suspected) exposure to covid-19: Secondary | ICD-10-CM | POA: Diagnosis present

## 2021-07-21 DIAGNOSIS — E1122 Type 2 diabetes mellitus with diabetic chronic kidney disease: Secondary | ICD-10-CM | POA: Diagnosis present

## 2021-07-21 DIAGNOSIS — Z66 Do not resuscitate: Secondary | ICD-10-CM | POA: Diagnosis present

## 2021-07-21 DIAGNOSIS — Z87891 Personal history of nicotine dependence: Secondary | ICD-10-CM

## 2021-07-21 DIAGNOSIS — Z79899 Other long term (current) drug therapy: Secondary | ICD-10-CM

## 2021-07-21 DIAGNOSIS — Z7982 Long term (current) use of aspirin: Secondary | ICD-10-CM

## 2021-07-21 DIAGNOSIS — E1140 Type 2 diabetes mellitus with diabetic neuropathy, unspecified: Secondary | ICD-10-CM | POA: Diagnosis present

## 2021-07-21 DIAGNOSIS — I2510 Atherosclerotic heart disease of native coronary artery without angina pectoris: Secondary | ICD-10-CM | POA: Diagnosis present

## 2021-07-21 DIAGNOSIS — J811 Chronic pulmonary edema: Secondary | ICD-10-CM

## 2021-07-21 DIAGNOSIS — I10 Essential (primary) hypertension: Secondary | ICD-10-CM | POA: Diagnosis present

## 2021-07-21 DIAGNOSIS — U099 Post covid-19 condition, unspecified: Secondary | ICD-10-CM | POA: Diagnosis present

## 2021-07-21 DIAGNOSIS — J9620 Acute and chronic respiratory failure, unspecified whether with hypoxia or hypercapnia: Secondary | ICD-10-CM | POA: Diagnosis not present

## 2021-07-21 DIAGNOSIS — J440 Chronic obstructive pulmonary disease with acute lower respiratory infection: Secondary | ICD-10-CM | POA: Diagnosis present

## 2021-07-21 DIAGNOSIS — E876 Hypokalemia: Secondary | ICD-10-CM | POA: Diagnosis present

## 2021-07-21 DIAGNOSIS — I130 Hypertensive heart and chronic kidney disease with heart failure and stage 1 through stage 4 chronic kidney disease, or unspecified chronic kidney disease: Secondary | ICD-10-CM | POA: Diagnosis present

## 2021-07-21 DIAGNOSIS — E669 Obesity, unspecified: Secondary | ICD-10-CM | POA: Diagnosis present

## 2021-07-21 DIAGNOSIS — N400 Enlarged prostate without lower urinary tract symptoms: Secondary | ICD-10-CM | POA: Diagnosis present

## 2021-07-21 DIAGNOSIS — N181 Chronic kidney disease, stage 1: Secondary | ICD-10-CM | POA: Diagnosis present

## 2021-07-21 DIAGNOSIS — J9621 Acute and chronic respiratory failure with hypoxia: Secondary | ICD-10-CM | POA: Diagnosis present

## 2021-07-21 DIAGNOSIS — T380X5A Adverse effect of glucocorticoids and synthetic analogues, initial encounter: Secondary | ICD-10-CM | POA: Diagnosis not present

## 2021-07-21 DIAGNOSIS — Z7985 Long-term (current) use of injectable non-insulin antidiabetic drugs: Secondary | ICD-10-CM

## 2021-07-21 DIAGNOSIS — I469 Cardiac arrest, cause unspecified: Secondary | ICD-10-CM

## 2021-07-21 DIAGNOSIS — Z7189 Other specified counseling: Secondary | ICD-10-CM

## 2021-07-21 LAB — COMPREHENSIVE METABOLIC PANEL
ALT: 30 U/L (ref 0–44)
AST: 31 U/L (ref 15–41)
Albumin: 3.2 g/dL — ABNORMAL LOW (ref 3.5–5.0)
Alkaline Phosphatase: 94 U/L (ref 38–126)
Anion gap: 6 (ref 5–15)
BUN: 21 mg/dL (ref 8–23)
CO2: 36 mmol/L — ABNORMAL HIGH (ref 22–32)
Calcium: 8.6 mg/dL — ABNORMAL LOW (ref 8.9–10.3)
Chloride: 98 mmol/L (ref 98–111)
Creatinine, Ser: 0.69 mg/dL (ref 0.61–1.24)
GFR, Estimated: >60 mL/min (ref >60)
Glucose, Bld: 160 mg/dL — ABNORMAL HIGH (ref 70–99)
Potassium: 3.6 mmol/L (ref 3.5–5.1)
Sodium: 140 mmol/L (ref 135–145)
Total Bilirubin: 0.7 mg/dL (ref 0.3–1.2)
Total Protein: 7 g/dL (ref 6.5–8.1)

## 2021-07-21 LAB — CBC
HCT: 31.5 % — ABNORMAL LOW (ref 39.0–52.0)
Hemoglobin: 10.2 g/dL — ABNORMAL LOW (ref 13.0–17.0)
MCH: 30.6 pg (ref 26.0–34.0)
MCHC: 32.4 g/dL (ref 30.0–36.0)
MCV: 94.6 fL (ref 80.0–100.0)
Platelets: 230 10*3/uL (ref 150–400)
RBC: 3.33 MIL/uL — ABNORMAL LOW (ref 4.22–5.81)
RDW: 14.4 % (ref 11.5–15.5)
WBC: 10.4 10*3/uL (ref 4.0–10.5)
nRBC: 0.9 % — ABNORMAL HIGH (ref 0.0–0.2)

## 2021-07-21 LAB — PROCALCITONIN: Procalcitonin: <0.1 ng/mL

## 2021-07-21 LAB — CBG MONITORING, ED: Glucose-Capillary: 194 mg/dL — ABNORMAL HIGH (ref 70–99)

## 2021-07-21 LAB — D-DIMER, QUANTITATIVE: D-Dimer, Quant: 0.8 ug/mL-FEU — ABNORMAL HIGH (ref 0.00–0.50)

## 2021-07-21 LAB — BRAIN NATRIURETIC PEPTIDE: B Natriuretic Peptide: 35.9 pg/mL (ref 0.0–100.0)

## 2021-07-21 LAB — RESP PANEL BY RT-PCR (FLU A&B, COVID) ARPGX2
Influenza A by PCR: NEGATIVE
Influenza B by PCR: NEGATIVE
SARS Coronavirus 2 by RT PCR: NEGATIVE

## 2021-07-21 LAB — LACTIC ACID, PLASMA
Lactic Acid, Venous: 2.5 mmol/L (ref 0.5–1.9)
Lactic Acid, Venous: 1.1 mmol/L (ref 0.5–1.9)

## 2021-07-21 LAB — TROPONIN I (HIGH SENSITIVITY)
Troponin I (High Sensitivity): 7 ng/L (ref <18)
Troponin I (High Sensitivity): 10 ng/L (ref <18)

## 2021-07-21 LAB — GLUCOSE, CAPILLARY: Glucose-Capillary: 231 mg/dL — ABNORMAL HIGH (ref 70–99)

## 2021-07-21 LAB — MRSA NEXT GEN BY PCR, NASAL: MRSA by PCR Next Gen: NOT DETECTED

## 2021-07-21 MED ORDER — VITAMIN D3 25 MCG (1000 UNIT) PO TABS
1000.0000 [IU] | ORAL_TABLET | Freq: Every morning | ORAL | Status: DC
  Filled 2021-07-21: qty 1

## 2021-07-21 MED ORDER — ASCORBIC ACID 500 MG PO TABS: 500.0000 mg | ORAL_TABLET | Freq: Every day | ORAL | Status: DC

## 2021-07-21 MED ORDER — INSULIN GLARGINE-YFGN 100 UNIT/ML ~~LOC~~ SOLN
54.0000 [IU] | Freq: Every day | SUBCUTANEOUS | Status: DC
  Administered 2021-07-22 – 2021-07-24 (×3): 54 [IU] via SUBCUTANEOUS
  Filled 2021-07-21 (×4): qty 0.54

## 2021-07-21 MED ORDER — METHYLPREDNISOLONE SODIUM SUCC 125 MG IJ SOLR
125.0000 mg | Freq: Once | INTRAMUSCULAR | Status: AC
  Administered 2021-07-21: 125 mg via INTRAVENOUS
  Filled 2021-07-21: qty 2

## 2021-07-21 MED ORDER — FUROSEMIDE 10 MG/ML IJ SOLN
40.0000 mg | Freq: Every day | INTRAMUSCULAR | Status: DC
  Administered 2021-07-21: 40 mg via INTRAVENOUS
  Filled 2021-07-21: qty 4

## 2021-07-21 MED ORDER — VANCOMYCIN HCL 1500 MG/300ML IV SOLN
1500.0000 mg | Freq: Two times a day (BID) | INTRAVENOUS | Status: DC
  Filled 2021-07-21: qty 300

## 2021-07-21 MED ORDER — INSULIN GLARGINE-YFGN 100 UNIT/ML ~~LOC~~ SOLN: 54.0000 [IU] | Freq: Two times a day (BID) | SUBCUTANEOUS | Status: DC

## 2021-07-21 MED ORDER — ZINC SULFATE 220 (50 ZN) MG PO CAPS: 220.0000 mg | ORAL_CAPSULE | Freq: Every day | ORAL | Status: DC

## 2021-07-21 MED ORDER — IOHEXOL 350 MG/ML SOLN
75.0000 mL | Freq: Once | INTRAVENOUS | Status: AC | PRN
  Administered 2021-07-21: 75 mL via INTRAVENOUS

## 2021-07-21 MED ORDER — PREDNISONE 20 MG PO TABS: 40.0000 mg | ORAL_TABLET | Freq: Every day | ORAL | Status: DC

## 2021-07-21 MED ORDER — SODIUM CHLORIDE 0.9 % IV SOLN: 250.0000 mL | INTRAVENOUS | Status: DC | PRN

## 2021-07-21 MED ORDER — SODIUM CHLORIDE 0.9 % IV SOLN
2.0000 g | Freq: Three times a day (TID) | INTRAVENOUS | Status: DC
  Administered 2021-07-21 – 2021-07-22 (×2): 2 g via INTRAVENOUS
  Filled 2021-07-21 (×5): qty 2

## 2021-07-21 MED ORDER — PANTOPRAZOLE SODIUM 40 MG IV SOLR
40.0000 mg | Freq: Two times a day (BID) | INTRAVENOUS | Status: DC
Start: 2021-07-21 — End: 2021-07-24
  Administered 2021-07-21 – 2021-07-24 (×6): 40 mg via INTRAVENOUS
  Filled 2021-07-21 (×5): qty 40

## 2021-07-21 MED ORDER — PANTOPRAZOLE SODIUM 40 MG PO TBEC: 40.0000 mg | DELAYED_RELEASE_TABLET | Freq: Two times a day (BID) | ORAL | Status: DC

## 2021-07-21 MED ORDER — DOCUSATE SODIUM 100 MG PO CAPS: 100.0000 mg | ORAL_CAPSULE | Freq: Two times a day (BID) | ORAL | Status: DC | PRN

## 2021-07-21 MED ORDER — INSULIN ASPART 100 UNIT/ML IJ SOLN
0.0000 [IU] | INTRAMUSCULAR | Status: DC
  Administered 2021-07-21: 5 [IU] via SUBCUTANEOUS
  Administered 2021-07-22 (×6): 3 [IU] via SUBCUTANEOUS
  Administered 2021-07-23: 8 [IU] via SUBCUTANEOUS
  Administered 2021-07-23: 3 [IU] via SUBCUTANEOUS
  Administered 2021-07-23: 8 [IU] via SUBCUTANEOUS
  Administered 2021-07-24: 01:00:00 5 [IU] via SUBCUTANEOUS
  Filled 2021-07-21 (×11): qty 1

## 2021-07-21 MED ORDER — ONDANSETRON HCL 4 MG/2ML IJ SOLN: 4.0000 mg | Freq: Four times a day (QID) | INTRAMUSCULAR | Status: DC | PRN

## 2021-07-21 MED ORDER — IPRATROPIUM-ALBUTEROL 0.5-2.5 (3) MG/3ML IN SOLN
3.0000 mL | Freq: Four times a day (QID) | RESPIRATORY_TRACT | Status: DC
  Administered 2021-07-21 – 2021-07-24 (×13): 3 mL via RESPIRATORY_TRACT
  Filled 2021-07-21 (×14): qty 3

## 2021-07-21 MED ORDER — ROSUVASTATIN CALCIUM 10 MG PO TABS: 5.0000 mg | ORAL_TABLET | ORAL | Status: DC

## 2021-07-21 MED ORDER — POLYVINYL ALCOHOL 1.4 % OP SOLN
1.0000 [drp] | OPHTHALMIC | Status: DC | PRN
  Filled 2021-07-21: qty 15

## 2021-07-21 MED ORDER — GLUCERNA SHAKE PO LIQD
237.0000 mL | Freq: Two times a day (BID) | ORAL | Status: DC
  Administered 2021-07-23 – 2021-07-24 (×3): 237 mL via ORAL

## 2021-07-21 MED ORDER — ONDANSETRON HCL 4 MG PO TABS: 4.0000 mg | ORAL_TABLET | Freq: Four times a day (QID) | ORAL | Status: DC | PRN

## 2021-07-21 MED ORDER — IPRATROPIUM-ALBUTEROL 0.5-2.5 (3) MG/3ML IN SOLN
3.0000 mL | Freq: Once | RESPIRATORY_TRACT | Status: AC
  Administered 2021-07-21: 3 mL via RESPIRATORY_TRACT
  Filled 2021-07-21: qty 3

## 2021-07-21 MED ORDER — VANCOMYCIN HCL 1500 MG/300ML IV SOLN
1500.0000 mg | INTRAVENOUS | Status: AC
  Administered 2021-07-22: 1500 mg via INTRAVENOUS
  Filled 2021-07-21: qty 300

## 2021-07-21 MED ORDER — SODIUM CHLORIDE 0.9% FLUSH: 3.0000 mL | INTRAVENOUS | Status: DC | PRN

## 2021-07-21 MED ORDER — DEXAMETHASONE SODIUM PHOSPHATE 10 MG/ML IJ SOLN: 8.0000 mg | Freq: Two times a day (BID) | INTRAMUSCULAR | Status: DC

## 2021-07-21 MED ORDER — MOMETASONE FURO-FORMOTEROL FUM 200-5 MCG/ACT IN AERO
2.0000 | INHALATION_SPRAY | Freq: Two times a day (BID) | RESPIRATORY_TRACT | Status: DC
  Administered 2021-07-22 – 2021-07-24 (×5): 2 via RESPIRATORY_TRACT
  Filled 2021-07-21 (×2): qty 8.8

## 2021-07-21 MED ORDER — CHLORHEXIDINE GLUCONATE CLOTH 2 % EX PADS
6.0000 | MEDICATED_PAD | Freq: Every day | CUTANEOUS | Status: DC
  Administered 2021-07-21 – 2021-07-22 (×2): 6 via TOPICAL

## 2021-07-21 MED ORDER — TAMSULOSIN HCL 0.4 MG PO CAPS
0.4000 mg | ORAL_CAPSULE | Freq: Every day | ORAL | Status: DC
  Administered 2021-07-23 – 2021-07-24 (×2): 0.4 mg via ORAL
  Filled 2021-07-21 (×2): qty 1

## 2021-07-21 MED ORDER — SULFAMETHOXAZOLE-TRIMETHOPRIM 800-160 MG PO TABS
1.0000 | ORAL_TABLET | ORAL | Status: DC
  Filled 2021-07-21 (×2): qty 1

## 2021-07-21 MED ORDER — LOSARTAN POTASSIUM 50 MG PO TABS: 50.0000 mg | ORAL_TABLET | Freq: Every morning | ORAL | Status: DC

## 2021-07-21 MED ORDER — SODIUM CHLORIDE 0.9 % IV SOLN
2.0000 g | Freq: Once | INTRAVENOUS | Status: AC
  Administered 2021-07-21: 2 g via INTRAVENOUS
  Filled 2021-07-21: qty 2

## 2021-07-21 MED ORDER — ACETAMINOPHEN 650 MG RE SUPP: 650.0000 mg | Freq: Four times a day (QID) | RECTAL | Status: DC | PRN

## 2021-07-21 MED ORDER — VANCOMYCIN HCL IN DEXTROSE 1-5 GM/200ML-% IV SOLN
1000.0000 mg | Freq: Once | INTRAVENOUS | Status: AC
  Administered 2021-07-21: 1000 mg via INTRAVENOUS
  Filled 2021-07-21: qty 200

## 2021-07-21 MED ORDER — LIDOCAINE HCL (PF) 4 % IJ SOLN
5.0000 mL | INTRAMUSCULAR | Status: DC | PRN
  Filled 2021-07-21 (×3): qty 5

## 2021-07-21 MED ORDER — VANCOMYCIN HCL 1000 MG/200ML IV SOLN
1000.0000 mg | Freq: Two times a day (BID) | INTRAVENOUS | Status: DC
  Filled 2021-07-21: qty 200

## 2021-07-21 MED ORDER — POLYETHYLENE GLYCOL 3350 17 G PO PACK: 17.0000 g | PACK | Freq: Every day | ORAL | Status: DC | PRN

## 2021-07-21 MED ORDER — METHYLPREDNISOLONE SODIUM SUCC 40 MG IJ SOLR
40.0000 mg | Freq: Two times a day (BID) | INTRAMUSCULAR | Status: AC
  Administered 2021-07-21 – 2021-07-22 (×2): 40 mg via INTRAVENOUS
  Filled 2021-07-21 (×2): qty 1

## 2021-07-21 MED ORDER — HYDROCOD POLST-CPM POLST ER 10-8 MG/5ML PO SUER
5.0000 mL | Freq: Two times a day (BID) | ORAL | Status: DC | PRN
  Administered 2021-07-21 – 2021-07-22 (×2): 5 mL via ORAL
  Filled 2021-07-21 (×2): qty 5

## 2021-07-21 MED ORDER — FUROSEMIDE 40 MG PO TABS: 20.0000 mg | ORAL_TABLET | Freq: Every day | ORAL | Status: DC

## 2021-07-21 MED ORDER — ENOXAPARIN SODIUM 40 MG/0.4ML IJ SOSY
40.0000 mg | PREFILLED_SYRINGE | INTRAMUSCULAR | Status: DC
  Administered 2021-07-22 – 2021-07-23 (×2): 40 mg via SUBCUTANEOUS
  Filled 2021-07-21 (×3): qty 0.4

## 2021-07-21 MED ORDER — SODIUM CHLORIDE 0.9% FLUSH
3.0000 mL | Freq: Two times a day (BID) | INTRAVENOUS | Status: DC
  Administered 2021-07-22 – 2021-07-24 (×7): 3 mL via INTRAVENOUS

## 2021-07-21 MED ORDER — GABAPENTIN 300 MG PO CAPS: 300.0000 mg | ORAL_CAPSULE | Freq: Three times a day (TID) | ORAL | Status: DC

## 2021-07-21 MED ORDER — INSULIN ASPART 100 UNIT/ML IJ SOLN: 0.0000 [IU] | Freq: Three times a day (TID) | INTRAMUSCULAR | Status: DC

## 2021-07-21 MED ORDER — ACETAMINOPHEN 325 MG PO TABS
650.0000 mg | ORAL_TABLET | Freq: Four times a day (QID) | ORAL | Status: DC | PRN
  Administered 2021-07-24 (×2): 650 mg via ORAL
  Filled 2021-07-21 (×2): qty 2

## 2021-07-21 MED ORDER — SALINE SPRAY 0.65 % NA SOLN
1.0000 | NASAL | Status: DC | PRN
  Administered 2021-07-24: 10:00:00 1 via NASAL
  Filled 2021-07-21 (×2): qty 44

## 2021-07-21 MED ORDER — LORAZEPAM 0.5 MG PO TABS
0.5000 mg | ORAL_TABLET | Freq: Four times a day (QID) | ORAL | Status: DC | PRN
  Administered 2021-07-21 – 2021-07-22 (×2): 0.5 mg via ORAL
  Filled 2021-07-21 (×2): qty 1

## 2021-07-21 NOTE — ED Notes (Signed)
RT at bedside to take the patient to CT scan along with this Probation officer.

## 2021-07-21 NOTE — Progress Notes (Signed)
CODE SEPSIS - PHARMACY COMMUNICATION  **Broad Spectrum Antibiotics should be administered within 1 hour of Sepsis diagnosis**  Time Code Sepsis Called/Page Received: 1430  Antibiotics Ordered: vancomycin 1,000 mg x 1 and cefepime 2 grams x 1  Time of 1st antibiotic administration: 1511  Additional action taken by pharmacy: Messaged at 30 minute mark  If necessary, Name of Provider/Nurse Contacted: RN   Wynelle Cleveland, PharmD Pharmacy Resident  06/30/2021 2:50 PM

## 2021-07-21 NOTE — Consult Note (Addendum)
Pharmacy Antibiotic Note  Dylan Pittman is a 70 y.o. male admitted on 07/09/2021 with pneumonia.  Pharmacy has been consulted for Vancomycin dosing.  Plan: Vancomycin 1000mg  given in ED. Additional 1500mg  x1 ordered for total of 2500mg  loading dose, then Vancomycin 1000 mg IV Q 12 hrs.  Goal AUC 400-550. Expected AUC: 509 SCr used: 0.8 (actual value 0.69)   Height: 5\' 8"  (172.7 cm) Weight: 104.1 kg (229 lb 8 oz) IBW/kg (Calculated) : 68.4  Temp (24hrs), Avg:98.8 F (37.1 C), Min:98.6 F (37 C), Max:98.9 F (37.2 C)  Recent Labs  Lab 07/18/2021 1340 07/13/2021 1408 07/11/2021 1829  WBC  --  10.4  --   CREATININE 0.69  --   --   LATICACIDVEN 2.5*  --  1.1    Estimated Creatinine Clearance: 103.4 mL/min (by C-G formula based on SCr of 0.69 mg/dL).    No Known Allergies  Antimicrobials this admission: 11/24 Cefepime >>  11/24 Vancomycin >>   Dose adjustments this admission: none  Microbiology results: 11/24 UCx: ordered  11/24 Blood cx: pending 11/24 MRSA PCR: pending  Thank you for allowing pharmacy to be a part of this patient's care.  Dylan Pittman PharmD, BCPS 07/12/2021 9:46 PM

## 2021-07-21 NOTE — H&P (Signed)
History and Physical    Dylan Pittman UXN:[id number] DOB: 1951-04-06 DOA: 06/30/2021  PCP: Sofie Hartigan, MD   Patient coming from: Home  I have personally briefly reviewed patient's old medical records in [place]  Chief Complaint: Shortness of breath  HPI: Dylan Pittman is a 70 y.o. male with medical history significant for pulmonary fibrosis now on 16 L of oxygen, hypertension, diabetes mellitus, obstructive sleep apnea, prostate cancer, status post COVID-19 pneumonia with prolonged hospitalization and then discharged to a long-term acute care facility.  He was recently discharged home from [facility] in [place] 2 days ago and states that since his discharge he has become increasingly short of breath and his pulse oximetry has been low despite being on his oxygen as recommended. He has a cough productive of clear thick phlegm but denies having any fever or chills and denies having any sick contacts. He has no lower extremity swelling, no chest pain, no dizziness, no lightheadedness, no headache, no abdominal pain, no changes in his bowel habits, no urinary symptoms, no palpitations, no diaphoresis, no weakness. Labs show sodium 140, potassium 3.6, chloride 98, bicarb 36, glucose 160, BUN 21, creatinine 0.69, calcium 8.6, alkaline phosphatase 94, albumin 3.2, AST 31, ALT 30, white count 10.4, hemoglobin 10.2, hematocrit 31.5, MCV 94.6, RDW 14.4, platelet count 230 Respiratory viral panel is negative Chest x-ray reviewed by me shows increasing interstitial and alveolar opacities, suspicious for superimposed pneumonia or edema on background of interstitial lung disease. Twelve-lead EKG reviewed by me shows sinus tachycardia with PVCs and borderline left axis deviation.    ED Course: Patient is a 70 year old male with a history of pulmonary fibrosis on 16 L of oxygen continuous who was recently discharged from a long-term acute care hospital 2 days prior to this  hospitalization after a prolonged stay for COVID-19 pneumonia. He presents to the ER for evaluation of worsening shortness of breath from his baseline associated with a cough productive of clear thick phlegm. Per patient despite being on his 16 L of oxygen his pulse oximetry had been low.  When EMS arrived he was noted to be in respiratory distress and was placed on a CPAP at 100% FiO2 with pulse oximetry of 51. He was switched to BiPAP in the emergency room and appears more comfortable and able to speak 1-2 word sentences though coughing frequently. He received broad-spectrum antibiotic therapy in the ER, IV Solu-Medrol and nebulizer treatments and will be admitted to the hospital for further evaluation.   Review of Systems: As per HPI otherwise all other systems reviewed and negative.    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    Past Surgical History:  Procedure Laterality Date   [place], 2005   CLOSED REDUCTION FINGER WITH PERCUTANEOUS PINNING Left 02/12/2020   Procedure: Closed reduction and pinning of left first metacarpal fracture with possible open reduction;  Surgeon: Hessie Knows, MD;  Location: [facility] ORS;  Service: Orthopedics;  Laterality: Left;   COLONOSCOPY WITH PROPOFOL N/A 12/28/2017   Procedure: COLONOSCOPY WITH PROPOFOL;  Surgeon: Manya Silvas, MD;  Location: [facility] ENDOSCOPY;  Service: Endoscopy;  Laterality: N/A;   NASAL SEPTUM SURGERY     RADIOACTIVE SEED IMPLANT N/A 04/12/2021   Procedure: RADIOACTIVE SEED IMPLANT/BRACHYTHERAPY IMPLANT;  Surgeon: Abbie Sons, MD;  Location: [facility] ORS;  Service:  Urology;  Laterality: N/A;  73 seeds implanted   VASECTOMY  2005     reports that he has quit smoking. He has never used smokeless tobacco. He reports that he does not currently use alcohol. He reports that he does not currently use drugs.  No Known Allergies  Family History  Problem Relation Age of Onset   Prostate cancer Neg Hx    Kidney cancer Neg Hx       Prior to Admission medications   Medication Sig Start Date End Date Taking? Authorizing Provider  albuterol (VENTOLIN HFA) 108 (90 Base) MCG/ACT inhaler Inhale 2 puffs into the lungs every 6 (six) hours as needed for wheezing or shortness of breath. 01/20/20   [provider]  ascorbic acid (VITAMIN C) 1000 MG tablet Take 0.5 tablets (500 mg total) by mouth daily. 05/14/21   Loletha Grayer, MD  chlorpheniramine-HYDROcodone (TUSSIONEX) 10-8 MG/5ML SUER Take 5 mLs by mouth every 12 (twelve) hours as needed for cough. 06/17/21   Raiford Noble Latif, DO  cholecalciferol (VITAMIN D) 25 MCG (1000 UNIT) tablet Take 1,000 Units by mouth in the morning.    [provider]  dexamethasone (DECADRON) 6 MG tablet Take 1 tablet (6 mg total) by mouth every 12 (twelve) hours. 06/17/21   Raiford Noble Latif, DO  docusate sodium (COLACE) 100 MG capsule Take 1 capsule (100 mg total) by mouth 2 (two) times daily as needed for mild constipation. 06/17/21   Sheikh, Georgina Quint Latif, DO  feeding supplement, GLUCERNA SHAKE, (GLUCERNA SHAKE) LIQD Take 237 mLs by mouth 2 (two) times daily between meals. 05/13/21   Loletha Grayer, MD  fluticasone-salmeterol (ADVAIR HFA) 401-02 MCG/ACT inhaler Inhale 2 puffs into the lungs 2 (two) times daily.    [provider]  furosemide (LASIX) 20 MG tablet Take 20 mg by mouth daily. 05/26/21   [provider]  gabapentin (NEURONTIN) 300 MG capsule Take 300 mg by mouth 3 (three) times daily. 05/26/21   [provider]  guaiFENesin-dextromethorphan (ROBITUSSIN DM) 100-10 MG/5ML syrup  Take 5 mLs by mouth every 4 (four) hours as needed for cough. 06/17/21   Sheikh, Georgina Quint Latif, DO  insulin aspart (NOVOLOG) 100 UNIT/ML injection Inject 12 Units into the skin 3 (three) times daily with meals. 06/17/21   Sheikh, Georgina Quint Latif, DO  insulin aspart (NOVOLOG) 100 UNIT/ML injection Inject 0-20 Units into the skin 4 (four) times daily -  before meals and at bedtime. 06/17/21   Sheikh, Omair Latif, DO  insulin glargine-yfgn (SEMGLEE) 100 UNIT/ML injection Inject 0.54 mLs (54 Units total) into the skin 2 (two) times daily. 06/17/21   Sheikh, Omair Latif, DO  ipratropium-albuterol (DUONEB) 0.5-2.5 (3) MG/3ML SOLN Take 3 mLs by nebulization every 6 (six) hours. 06/17/21   Sheikh, Georgina Quint Latif, DO  lidocaine (XYLOCAINE) 4 % (PF) injection Inhale 5 mLs into the lungs every 4 (four) hours as needed (cough). 06/17/21   Sheikh, Omair Latif, DO  LORazepam (ATIVAN) 0.5 MG tablet Take 1 tablet (0.5 mg total) by mouth every 6 (six) hours as needed for anxiety. 06/17/21   Sheikh, Omair Latif, DO  losartan (COZAAR) 50 MG tablet Take 50 mg by mouth in the morning. 10/07/17   [provider]  neomycin-bacitracin-polymyxin (NEOSPORIN) ointment Apply topically 2 (two) times daily. 06/17/21  Sheikh, Omair Latif, DO  pantoprazole (PROTONIX) 40 MG tablet Take 40 mg by mouth 2 (two) times daily. 10/19/17   [provider]  phenol (CHLORASEPTIC) 1.4 % LIQD Use as directed 1 spray in the mouth or throat as needed for throat irritation / pain. 06/17/21   Sheikh, Georgina Quint Latif, DO  polyethylene glycol (MIRALAX / GLYCOLAX) 17 g packet Take 17 g by mouth daily as needed for moderate constipation. 06/17/21   Raiford Noble Latif, DO  polyvinyl alcohol (LIQUIFILM TEARS) 1.4 % ophthalmic solution Place 1 drop into both eyes as needed for dry eyes. 06/17/21   Raiford Noble Latif, DO  rosuvastatin (CRESTOR) 5 MG tablet Take 5 mg by mouth every Monday, Wednesday, and Friday. In the morning 04/19/18   [provider]  sodium chloride (OCEAN) 0.65 % SOLN nasal spray Place 1 spray into both nostrils as needed for congestion. 06/17/21   Raiford Noble Latif, DO  sulfamethoxazole-trimethoprim (BACTRIM DS) 800-160 MG tablet Take 1 tablet by mouth 3 (three) times a week. 06/20/21   Raiford Noble Latif, DO  tamsulosin (FLOMAX) 0.4 MG CAPS capsule Take 1 capsule (0.4 mg total) by mouth daily. 03/25/21   Vaillancourt, Aldona Bar, PA-C  zinc sulfate 220 (50 Zn) MG capsule Take 1 capsule (220 mg total) by mouth daily. 05/14/21   Loletha Grayer, MD    Physical Exam: Vitals:   07/19/2021 1415 07/06/2021 1430 06/28/2021 1445 07/18/2021 1500  BP: 128/70 131/70 130/69 129/73  Pulse: (!) 110 (!) 110 (!) 113 (!) 112  Resp: (!) 31 (!) 24 (!) 27 (!) 26  Temp:      TempSrc:      SpO2: 100% 100% 99% 100%  Weight:      Height:         Vitals:   07/20/2021 1415 07/22/2021 1430 07/17/2021 1445 06/29/2021 1500  BP: 128/70 131/70 130/69 129/73  Pulse: (!) 110 (!) 110 (!) 113 (!) 112  Resp: (!) 31 (!) 24 (!) 27 (!) 26  Temp:      TempSrc:      SpO2: 100% 100% 99% 100%  Weight:      Height:          Constitutional: Alert and oriented x 3 .  Appears comfortable on BiPAP.  Able to speak in 1-2 word sentences HEENT:      Head: Normocephalic and atraumatic.         Eyes: PERLA, EOMI, Conjunctivae are normal. Sclera is non-icteric.       Mouth/Throat: Mucous membranes are moist.       Neck: Supple with no signs of meningismus. Cardiovascular: Tachycardic. No murmurs, gallops, or rubs. 2+ symmetrical distal pulses are present . No JVD. No LE edema Respiratory: Tachypneic.crackles at the bases bilaterally.  Faint wheezes in both lung fields  Gastrointestinal: Soft, non tender, and non distended with positive bowel sounds.  Genitourinary: No CVA tenderness. Musculoskeletal: Nontender with normal range of motion in all extremities. No cyanosis, or erythema of extremities. Neurologic:  Face is symmetric. Moving all  extremities. No gross focal neurologic deficits . Skin: Skin is warm, dry.  No rash or ulcers Psychiatric: Mood and affect are normal    Labs on Admission: I have personally reviewed following labs and imaging studies  CBC: Recent Labs  Lab 06/28/2021 1408  WBC 10.4  HGB 10.2*  HCT 31.5*  MCV 94.6  PLT 546   Basic Metabolic Panel: Recent Labs  Lab 06/29/2021 1340  NA 140  K 3.6  CL 98  CO2 36*  GLUCOSE 160*  BUN 21  CREATININE 0.69  CALCIUM 8.6*   GFR: Estimated Creatinine Clearance: 104.1 mL/min (by C-G formula based on SCr of 0.69 mg/dL). Liver Function Tests: Recent Labs  Lab 07/04/2021 1340  AST 31  ALT 30  ALKPHOS 94  BILITOT 0.7  PROT 7.0  ALBUMIN 3.2*   No results for input(s): LIPASE, AMYLASE in the last 168 hours. No results for input(s): AMMONIA in the last 168 hours. Coagulation Profile: No results for input(s): INR, PROTIME in the last 168 hours. Cardiac Enzymes: No results for input(s): CKTOTAL, CKMB, CKMBINDEX, TROPONINI in the last 168 hours. BNP (last 3 results) No results for input(s): PROBNP in the last 8760 hours. HbA1C: No results for input(s): HGBA1C in the last 72 hours. CBG: No results for input(s): GLUCAP in the last 168 hours. Lipid Profile: No results for input(s): CHOL, HDL, LDLCALC, TRIG, CHOLHDL, LDLDIRECT in the last 72 hours. Thyroid Function Tests: No results for input(s): TSH, T4TOTAL, FREET4, T3FREE, THYROIDAB in the last 72 hours. Anemia Panel: No results for input(s): VITAMINB12, FOLATE, FERRITIN, TIBC, IRON, RETICCTPCT in the last 72 hours. Urine analysis:    Component Value Date/Time   APPEARANCEUR Hazy (A) 04/21/2021 1430   GLUCOSEU 2+ (A) 04/21/2021 1430   BILIRUBINUR Negative 04/21/2021 1430   PROTEINUR Negative 04/21/2021 1430   NITRITE Negative 04/21/2021 1430   LEUKOCYTESUR Negative 04/21/2021 1430    Radiological Exams on Admission: DG Chest Portable 1 View  Result Date: 07/15/2021 CLINICAL DATA:   Shortness of breath, diagnosed previously with pneumonia and bronchitis superimposed on pulmonary fibrosis by report. EXAM: PORTABLE CHEST 1 VIEW COMPARISON:  June 16, 2021. FINDINGS: EKG leads project over the chest. Study limited by body habitus and portable technique. Stable cardiomediastinal contours and hilar structures, obscured by increasing nurse tissue old in alveolar opacities in the LEFT and RIGHT chest when compared to the previous study. No signs of lobar consolidative process or evidence of gross pleural effusion or pneumothorax. On limited assessment there is no acute skeletal process. IMPRESSION: Increasing interstitial and alveolar opacities, suspicious for superimposed pneumonia or edema on background interstitial lung disease. Electronically Signed   By: Zetta Bills M.D.   On: 07/27/2021 14:08     Assessment/Plan Principal Problem:   Acute on chronic respiratory failure (HCC) Active Problems:   Obesity, unspecified   Sleep apnea   Diabetes mellitus ([place])   ILD (interstitial lung disease) ([place])   Patient is a 70 year old Caucasian male with a history of interstitial lung disease admitted to the hospital for acute on chronic respiratory failure    Acute on chronic respiratory failure Patient has a history of interstitial lung disease and at baseline with 16 L of oxygen continuous Who presents to the ER via EMS for evaluation of worsening shortness of breath and upon arrival to the ER had increased work of breathing with tachypnea, respiratory rate in the 30s with use of accessory muscles and pulse oximetry 100% FiO2 of 51%. He is currently on BiPAP with decreased work of breathing Rule out pulmonary embolism in this patient with hypoxia Follow-up results of CT angiogram of the chest We will consult pulmonology    Acute exacerbation of underlying interstitial lung disease We will treat patient empirically with cefepime for presumed pneumonia Place patient on systemic  steroids Continue bronchodilator therapy    Diabetes mellitus Maintain consistent carbohydrate diet Place patient on Levemir 50 units daily with sliding scale coverage   DVT  prophylaxis: Lovenox  Code Status: DO NOT RESUSCITATE Family Communication: Greater than 50% of time was spent discussing patient's condition and plan of care with him at the bedside.  All questions and concerns have been addressed.  He verbalizes understanding and agrees to the plan.  CODE STATUS was discussed and he is a DO NOT RESUSCITATE. Disposition Plan: Back to previous home environment Consults called: Pulmonary  Status:At the time of admission, it appears that the appropriate admission status for this patient is inpatient. This is judged to be reasonable and necessary to provide the required intensity of service to ensure the patient's safety given the presenting symptoms, physical exam findings, and initial radiographic and laboratory data in the context of their comorbid conditions. Patient requires inpatient status due to high intensity of service, high risk for further deterioration and high frequency of surveillance required.     Collier Bullock MD [facility]     07/23/2021, 3:45 PM

## 2021-07-21 NOTE — Progress Notes (Signed)
NAME:  Dylan Pittman, MRN:  [id number], DOB:  10/28/1950, LOS: 0 ADMISSION DATE:  07/23/2021, CONSULTATION DATE:  05/30/21 REFERRING MD:  Dr. Creig Hines, CHIEF COMPLAINT:   Shortness of breath  History of Present Illness:  70 year old male presenting to [facility] ED from home on 07/08/2021 with complaints of progressive hypoxia at rest/with exertion and dyspnea with exertion.  Of note patient was recently hospitalized with COVID-19 pneumonia from 05/09/2021 to 05/13/2021 and 05/2021 for Acute exacerbation of ILD .  He was released from rehab facility only 2 days ago from previous discharge.  He shares things have been improving and he has been in usual state of health ambulatory with onset of dyspnea and cough after being home 2nd day.  He denies chest pain, does endorse onging LE edema.  He denies flu like illness.  He was unable to reach normoxia on maximal settings of supplemental O2 >6L while at home.  He requires BIPAP during my evaluation with setting of 90%.  He admits to epistaxis.  He denies chest pain, nausea/vomiting/diarrhea/abdominal pain, fever/chills, or sore throat.      Pertinent  Medical History  Pulmonary fibrosis Hypertension Hyperlipidemia Type 2 diabetes Obstructive sleep apnea COVID-19 (September 2022) Prostate cancer status post seed ablation and Lupron injections BPH GERD Bronchitis Significant Hospital Events: Including procedures, antibiotic start and stop dates in addition to other pertinent events     Objective   Blood pressure 128/70, pulse (!) 110, temperature 98.6 F (37 C), temperature source Axillary, resp. rate (!) 31, height 5\' 8"  (1.727 m), weight 105.7 kg, SpO2 100 %.       No intake or output data in the 24 hours ending 07/01/2021 1510 Filed Weights   07/08/2021 1344  Weight: 105.7 kg    Examination: General: Adult male, acutely ill, lying in bed, NAD HEENT: MM pink/moist, anicteric, atraumatic, neck supple Neuro: A&O x 4, able to follow commands,  PERRL +3, MAE CV: s1s2 RRR, sinus tachycardia on monitor, no r/m/g Pulm: Rmild rhonchi bilaterally  breath sounds coarse/diminished-BUL & diminished-BLL GI: soft, rounded, non tender, bs x 4 Skin: Limited exam-patient still in street clothes> no rashes/lesions noted Extremities: warm/dry, pulses + 2 R/P, 2+ edema noted BLE  Resolved Hospital Problem list     Assessment & Plan:  Acute Hypoxic Respiratory Failure       Post COVID ILD with pulmonary fibrosis and currently acute exacerbation of ILD  - may switch to HFNC  O2 to maintain SpO2 > 88% -RVP to rule out viral induced AEILD -ABG - Intermittent chest x-ray & ABG PRN - Ensure adequate pulmonary hygiene: IS Q 1 h x 10 while awake - dexamethasone 6 bid then tapering.  - dcd home Vitamin C & Zinc -increased protonix to bid  Type 2 Diabetes Mellitus At Risk for Steroid Induced Hyperglycemia - Monitor CBG AC, HS - SSI moderate dosing, continue home long-acting coverage (solostar 40 units daily) >> Lantus 20 units BID - target range while in ICU: 140-180 - follow ICU hyper/hypo-glycemia protocol - Carb-modified diet in place -contiue dexamethasone at current dose   BPH - continue home flomax - strict I & O's, monitor for urinary retention  Hyperlipidemia  Hypertension - continue home rosuvastatin - due to SIRS response to suspected Pneumonia with marginal-normal BP, will hold outpatient anti-hypertensives. Consider restarting: Losartan as patient stabilizes - PRN hydralazine if SBP > 160  Best Practice (right click and "Reselect all SmartList Selections" daily)  Diet/type: Regular consistency (see orders) DVT  prophylaxis: LMWH GI prophylaxis: PPI Lines: N/A Foley:  N/A Code Status:  full code Last date of multidisciplinary goals of care discussion [05/30/2021]  IMAGING     Recent Labs  Lab 07/18/2021 1408  WBC 10.4  HGB 10.2*  HCT 31.5*  MCV 94.6  PLT 732    Basic Metabolic Panel: Recent Labs  Lab  07/15/2021 1340  NA 140  K 3.6  CL 98  CO2 36*  GLUCOSE 160*  BUN 21  CREATININE 0.69  CALCIUM 8.6*   GFR: Estimated Creatinine Clearance: 104.1 mL/min (by C-G formula based on SCr of 0.69 mg/dL). Recent Labs  Lab 07/10/2021 1340 07/27/2021 1408  WBC  --  10.4  LATICACIDVEN 2.5*  --     Liver Function Tests: Recent Labs  Lab 07/24/2021 1340  AST 31  ALT 30  ALKPHOS 94  BILITOT 0.7  PROT 7.0  ALBUMIN 3.2*   No results for input(s): LIPASE, AMYLASE in the last 168 hours. No results for input(s): AMMONIA in the last 168 hours.  ABG No results found for: PHART, PCO2ART, PO2ART, HCO3, TCO2, ACIDBASEDEF, O2SAT   Coagulation Profile: No results for input(s): INR, PROTIME in the last 168 hours.  Cardiac Enzymes: No results for input(s): CKTOTAL, CKMB, CKMBINDEX, TROPONINI in the last 168 hours.  HbA1C: Hgb A1c MFr Bld  Date/Time Value Ref Range Status  05/09/2021 06:18 AM 8.6 (H) 4.8 - 5.6 % Final    Comment:    (NOTE) Pre diabetes:          5.7%-6.4%  Diabetes:              >6.4%  Glycemic control for   <7.0% adults with diabetes     CBG: No results for input(s): GLUCAP in the last 168 hours.  Review of Systems: Positives in BOLD   Gen: Denies fever, chills, weight change, fatigue, night sweats HEENT: Denies blurred vision, double vision, hearing loss, tinnitus, sinus congestion, rhinorrhea, sore throat, neck stiffness, dysphagia PULM: Denies shortness of breath, cough, sputum production, hemoptysis, wheezing CV: Denies chest pain, edema, orthopnea, paroxysmal nocturnal dyspnea, palpitations GI: Denies abdominal pain, nausea, vomiting, diarrhea, hematochezia, melena, constipation, change in bowel habits GU: Denies dysuria, hematuria, polyuria, oliguria, urethral discharge Endocrine: Denies hot or cold intolerance, polyuria, polyphagia or appetite change Derm: Denies rash, dry skin, scaling or peeling skin change Heme: Denies easy bruising, bleeding, bleeding  gums Neuro: Denies headache, numbness, weakness, slurred speech, loss of memory or consciousness  Past Medical History:  He,  has a past medical history of Chronic airway obstruction (HCC), DDD (degenerative disc disease), lumbar, Dupuytren's disease, Dyslipidemia (04/07/2014), Esophageal reflux (04/20/2014), Hypertension (04/20/2014), Microalbuminuria (04/07/2014), Microalbuminuria, Obesity, unspecified (04/07/2014), Sleep apnea (04/20/2014), and 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   Surgical History:   Past Surgical History:  Procedure Laterality Date   BACK SURGERY  1992, 2005   CLOSED REDUCTION FINGER WITH PERCUTANEOUS PINNING Left 02/12/2020   Procedure: Closed reduction and pinning of left first metacarpal fracture with possible open reduction;  Surgeon: Hessie Knows, MD;  Location: [facility] ORS;  Service: Orthopedics;  Laterality: Left;   COLONOSCOPY WITH PROPOFOL N/A 12/28/2017   Procedure: COLONOSCOPY WITH PROPOFOL;  Surgeon: Manya Silvas, MD;  Location: [facility] ENDOSCOPY;  Service: Endoscopy;  Laterality: N/A;   NASAL SEPTUM SURGERY     RADIOACTIVE SEED IMPLANT N/A 04/12/2021   Procedure: RADIOACTIVE SEED IMPLANT/BRACHYTHERAPY IMPLANT;  Surgeon: Abbie Sons, MD;  Location: [facility] ORS;  Service: Urology;  Laterality: N/A;  73 seeds implanted   VASECTOMY  2005     Social History:   reports that he has quit smoking. He has never used smokeless tobacco. He reports that he does not currently use alcohol. He reports that he does not currently use drugs.   Family History:  His family history is negative for Prostate cancer and Kidney cancer.   Allergies No Known Allergies   Home Medications  Prior to Admission medications   Medication Sig Start Date End Date Taking? Authorizing Provider  albuterol (VENTOLIN HFA) 108 (90 Base) MCG/ACT inhaler Inhale 2 puffs into the lungs every 6 (six) hours as needed for wheezing or shortness of breath. 01/20/20  Yes  [provider]  ascorbic acid (VITAMIN C) 1000 MG tablet Take 0.5 tablets (500 mg total) by mouth daily. 05/14/21  Yes Wieting, Richard, MD  cholecalciferol (VITAMIN D) 25 MCG (1000 UNIT) tablet Take 1,000 Units by mouth in the morning.   Yes [provider]  fluticasone-salmeterol (ADVAIR HFA) 115-21 MCG/ACT inhaler Inhale 2 puffs into the lungs 2 (two) times daily.   Yes [provider]  furosemide (LASIX) 20 MG tablet Take 20 mg by mouth daily. 05/26/21  Yes [provider]  gabapentin (NEURONTIN) 300 MG capsule Take 300 mg by mouth 3 (three) times daily. 05/26/21  Yes [provider]  insulin aspart (NOVOLOG) 100 UNIT/ML FlexPen Inject 8 Units into the skin 3 (three) times daily with meals. Okay to substitute generic 05/13/21  Yes Wieting, Richard, MD  losartan (COZAAR) 50 MG tablet Take 50 mg by mouth in the morning. 10/07/17  Yes [provider]  OZEMPIC, 1 MG/DOSE, 4 MG/3ML SOPN Inject 1 mg into the skin every Sunday. 07/29/19  Yes [provider]  pantoprazole (PROTONIX) 40 MG tablet Take 40 mg by mouth 2 (two) times daily. 10/19/17  Yes [provider]  pioglitazone (ACTOS) 30 MG tablet Take 30 mg by mouth in the morning. 07/27/19  Yes [provider]  rosuvastatin (CRESTOR) 5 MG tablet Take 5 mg by mouth every Monday, Wednesday, and Friday. In the morning 04/19/18  Yes [provider]  tamsulosin (FLOMAX) 0.4 MG CAPS capsule Take 1 capsule (0.4 mg total) by mouth daily. 03/25/21  Yes Vaillancourt, Samantha, PA-C  TOUJEO MAX SOLOSTAR 300 UNIT/ML Solostar Pen Inject 50 Units into the skin in the morning. Patient taking differently: Inject 40 Units into the skin in the morning. 05/13/21  Yes Wieting, Richard, MD  XIGDUO XR 12-998 MG TB24 Take 2 tablets by mouth every morning. 01/31/21  Yes [provider]  zinc sulfate 220 (50 Zn) MG capsule Take 1 capsule (220 mg total) by mouth daily. 05/14/21  Yes  Wieting, Richard, MD  aspirin EC 81 MG tablet Take 81 mg by mouth in the morning. Swallow whole. Patient not taking: Reported on 05/30/2021    [provider]  chlorpheniramine-HYDROcodone (TUSSIONEX) 10-8 MG/5ML SUER Take 5 mLs by mouth every 12 (twelve) hours as needed for cough. Patient not taking: No sig reported 05/13/21   Loletha Grayer, MD  feeding supplement, GLUCERNA SHAKE, (GLUCERNA SHAKE) LIQD Take 237 mLs by mouth 2 (two) times daily between meals. 05/13/21   Loletha Grayer, MD  fluticasone-salmeterol (ADVAIR HFA) 536-64 MCG/ACT inhaler Inhale 2 puffs into the lungs 2 (two) times daily. Rinse mouth out with water after use Patient not taking: No sig reported 05/13/21   Loletha Grayer, MD  Insulin Pen Needle 33G X 4 MM MISC 1 Dose by Does not  apply route 3 (three) times daily before meals. 05/13/21   Loletha Grayer, MD  predniSONE (DELTASONE) 10 MG tablet Three tabs po daily for five days Patient not taking: No sig reported 05/13/21   Loletha Grayer, MD     Critical care provider statement:   Total critical care time: 33 minutes   Performed by: Lanney Gins MD   Critical care time was exclusive of separately billable procedures and treating other patients.   Critical care was necessary to treat or prevent imminent or life-threatening deterioration.   Critical care was time spent personally by me on the following activities: development of treatment plan with patient and/or surrogate as well as nursing, discussions with consultants, evaluation of patient's response to treatment, examination of patient, obtaining history from patient or surrogate, ordering and performing treatments and interventions, ordering and review of laboratory studies, ordering and review of radiographic studies, pulse oximetry and re-evaluation of patient's condition.    Ottie Glazier, M.D.  Pulmonary & Critical Care Medicine      Ottie Glazier, M.D.  Pulmonary & Critical Care Medicine

## 2021-07-21 NOTE — ED Notes (Signed)
Patient was incontinent in the stretcher. Patient was cleaned up by Aldona Bar RN and [facility] ED tech. Patient was given a pillow behind his head for comfort.

## 2021-07-21 NOTE — ED Notes (Signed)
Male external catheter placed 

## 2021-07-21 NOTE — ED Notes (Signed)
Report given to [facility] in ICU.

## 2021-07-21 NOTE — ED Notes (Signed)
Sonja RN aware of assigned bed 

## 2021-07-21 NOTE — ED Notes (Signed)
Blood cultures were drawn by [employer] RN

## 2021-07-21 NOTE — ED Notes (Signed)
Dr. Kerman Passey informed of Lactic of 2.5

## 2021-07-21 NOTE — ED Notes (Signed)
Patient is back from CT scan. Tolerated it well.

## 2021-07-21 NOTE — ED Notes (Signed)
Patient placed back on Bi-pap. Sats increased to the 90's.

## 2021-07-21 NOTE — ED Notes (Signed)
Report given to Melissa RN

## 2021-07-21 NOTE — ED Notes (Signed)
Will give Duo nebs when respiratory can put on neb. Container on bi-pap

## 2021-07-21 NOTE — Progress Notes (Signed)
Elink is following sepsis bundle. 

## 2021-07-21 NOTE — ED Triage Notes (Addendum)
Per EMS report, patient was discharged from [facility] 2days ago with diagnosis of pneumonia, bronchitis and pulmonary fibrosis. Patient has two O2 concentrators at home, one at 10 and one at 6 per EMT's report. Patient was 51% on O2 and on Cpap. Patient is alert and oriented upon arrival and tachypneic.

## 2021-07-21 NOTE — Progress Notes (Signed)
eLink Physician-Brief Progress Note Patient Name: Dylan Pittman DOB: 1951-04-02 MRN: [id number]   Date of Service  07/06/2021  HPI/Events of Note  70 year old man admitted to ICU with respiratory failure needing BIPAP. Has ILD and post covid lung issues. No distress on camera with o2 sat 100 at this time, RR is 18-20 and HR/BP stable. Seen by PCCM.   eICU Interventions  Continue BIPAP, steroids, antibiotics, DVT prophylaxis. DNR status noted per MD note on admission and in orders. Call E link if needed     Intervention Category Major Interventions: Respiratory failure - evaluation and management Evaluation Type: New Patient Evaluation  Margaretmary Lombard 07/07/2021, 9:31 PM

## 2021-07-21 NOTE — Progress Notes (Signed)
Patient breathing comfortably on Bipap.  Tolerated trip to CT and back with no desaturation events.  Attempted change to HFNC at 50L/90%, however, patient desaturated to 78% after several minutes.  Placed back on Bipap at 18/10 with rapid increase in SpO2 to 90% range.

## 2021-07-21 NOTE — ED Notes (Signed)
Patient was changed to Hi-flow by RT. Patient is more labored at this time. Sats in the mid 80's. RT informed.

## 2021-07-21 NOTE — Progress Notes (Signed)
PHARMACY -  BRIEF ANTIBIOTIC NOTE   Pharmacy has received consult(s) for cefepime and vancomycin from an ED provider.  The patient's profile has been reviewed for ht/wt/allergies/indication/available labs.    One time order(s) placed for cefepime 2 grams x 1 and vancomycin 1,000 mg x 1  Further antibiotics/pharmacy consults should be ordered by admitting physician if indicated.                       Thank you, Wynelle Cleveland, PharmD Pharmacy Resident  07/09/2021 2:49 PM

## 2021-07-21 NOTE — ED Provider Notes (Signed)
[facility] Emergency Department Provider Note  Time seen: 1:45 PM  I have reviewed the triage vital signs and the nursing notes.   HISTORY  Chief Complaint Respiratory Distress   HPI Dylan Pittman is a 70 y.o. male with hypertension, hyperlipidemia, obesity, interstitial lung disease/pulmonary fibrosis on 16 L of oxygen at home presents to the emergency department for worsening shortness of breath and cough.  According to the patient over the past 2 days since being discharged from his care facility patient has been experiencing worsening shortness of breath and cough.  EMS placed the patient on CPAP at 100% oxygen with pulse ox of 51% upon arrival.  Patient switched to BiPAP and is doing much better.  Patient presented in mild to moderate respiratory distress increased work of breathing speaking in 1-2 word sentences.  Frequent cough noted.  Afebrile.  Denies any chest pain.   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    Patient Active Problem List   Diagnosis Date Noted   ILD (interstitial lung disease) ([place]) 06/06/2021   Acute on chronic respiratory failure with hypoxia ([place])    Pneumonia due to COVID-19 virus 05/09/2021   Prostate cancer ([place]) 01/24/2021   Benign prostatic hyperplasia without lower urinary tract symptoms 07/31/2019   Elevated PSA 06/18/2018   Vitamin D deficiency 01/18/2018   Esophageal reflux 04/20/2014   Hypertension 04/20/2014   Sleep apnea 04/20/2014   Dyslipidemia 04/07/2014   Microalbuminuria 04/07/2014   Obesity, unspecified 04/07/2014   Hyperlipidemia due to type 2  diabetes mellitus ([place]) 04/07/2014   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   Type 2 diabetes mellitus with hyperlipidemia ([place]) 01/08/2014    Past Surgical History:  Procedure Laterality Date   [place], 2005   CLOSED REDUCTION FINGER WITH PERCUTANEOUS PINNING Left 02/12/2020   Procedure: Closed reduction and pinning of left first metacarpal fracture with possible open reduction;  Surgeon: Hessie Knows, MD;  Location: [facility] ORS;  Service: Orthopedics;  Laterality: Left;   COLONOSCOPY WITH PROPOFOL N/A 12/28/2017   Procedure: COLONOSCOPY WITH PROPOFOL;  Surgeon: Manya Silvas, MD;  Location: [facility] ENDOSCOPY;  Service: Endoscopy;  Laterality: N/A;   NASAL SEPTUM SURGERY     RADIOACTIVE SEED IMPLANT N/A 04/12/2021   Procedure: RADIOACTIVE SEED IMPLANT/BRACHYTHERAPY IMPLANT;  Surgeon: Abbie Sons, MD;  Location: [facility] ORS;  Service: Urology;  Laterality: N/A;  73 seeds implanted   VASECTOMY  2005    Prior to Admission medications   Medication Sig Start Date End Date Taking? Authorizing Provider  albuterol (VENTOLIN HFA) 108 (90 Base) MCG/ACT inhaler Inhale 2 puffs into the lungs every 6 (six) hours as needed for wheezing or shortness of breath. 01/20/20   [provider]  ascorbic acid (VITAMIN C) 1000 MG tablet Take 0.5 tablets (500 mg total) by mouth daily. 05/14/21   Loletha Grayer, MD  chlorpheniramine-HYDROcodone (TUSSIONEX) 10-8 MG/5ML SUER Take 5 mLs by mouth every 12 (twelve) hours as needed for cough. 06/17/21   Raiford Noble Latif, DO  cholecalciferol (VITAMIN D) 25 MCG (1000 UNIT) tablet Take 1,000 Units by mouth in the morning.  [provider]  dexamethasone (DECADRON) 6 MG tablet Take 1 tablet (6 mg total) by mouth every 12 (twelve) hours. 06/17/21   Raiford Noble Latif, DO  docusate sodium (COLACE) 100 MG capsule Take 1 capsule (100 mg total) by mouth 2 (two) times daily as needed for mild constipation. 06/17/21    Sheikh, Georgina Quint Latif, DO  feeding supplement, GLUCERNA SHAKE, (GLUCERNA SHAKE) LIQD Take 237 mLs by mouth 2 (two) times daily between meals. 05/13/21   Loletha Grayer, MD  fluticasone-salmeterol (ADVAIR HFA) 629-47 MCG/ACT inhaler Inhale 2 puffs into the lungs 2 (two) times daily.    [provider]  furosemide (LASIX) 20 MG tablet Take 20 mg by mouth daily. 05/26/21   [provider]  gabapentin (NEURONTIN) 300 MG capsule Take 300 mg by mouth 3 (three) times daily. 05/26/21   [provider]  guaiFENesin-dextromethorphan (ROBITUSSIN DM) 100-10 MG/5ML syrup Take 5 mLs by mouth every 4 (four) hours as needed for cough. 06/17/21   Sheikh, Georgina Quint Latif, DO  insulin aspart (NOVOLOG) 100 UNIT/ML injection Inject 12 Units into the skin 3 (three) times daily with meals. 06/17/21   Sheikh, Georgina Quint Latif, DO  insulin aspart (NOVOLOG) 100 UNIT/ML injection Inject 0-20 Units into the skin 4 (four) times daily -  before meals and at bedtime. 06/17/21   Sheikh, Omair Latif, DO  insulin glargine-yfgn (SEMGLEE) 100 UNIT/ML injection Inject 0.54 mLs (54 Units total) into the skin 2 (two) times daily. 06/17/21   Sheikh, Omair Latif, DO  ipratropium-albuterol (DUONEB) 0.5-2.5 (3) MG/3ML SOLN Take 3 mLs by nebulization every 6 (six) hours. 06/17/21   Sheikh, Georgina Quint Latif, DO  lidocaine (XYLOCAINE) 4 % (PF) injection Inhale 5 mLs into the lungs every 4 (four) hours as needed (cough). 06/17/21   Sheikh, Omair Latif, DO  LORazepam (ATIVAN) 0.5 MG tablet Take 1 tablet (0.5 mg total) by mouth every 6 (six) hours as needed for anxiety. 06/17/21   Sheikh, Omair Latif, DO  losartan (COZAAR) 50 MG tablet Take 50 mg by mouth in the morning. 10/07/17   [provider]  neomycin-bacitracin-polymyxin (NEOSPORIN) ointment Apply topically 2 (two) times daily. 06/17/21   Sheikh, Omair Latif, DO  pantoprazole (PROTONIX) 40 MG tablet Take 40 mg by mouth 2 (two) times daily. 10/19/17   [provider]   phenol (CHLORASEPTIC) 1.4 % LIQD Use as directed 1 spray in the mouth or throat as needed for throat irritation / pain. 06/17/21   Sheikh, Georgina Quint Latif, DO  polyethylene glycol (MIRALAX / GLYCOLAX) 17 g packet Take 17 g by mouth daily as needed for moderate constipation. 06/17/21   Raiford Noble Latif, DO  polyvinyl alcohol (LIQUIFILM TEARS) 1.4 % ophthalmic solution Place 1 drop into both eyes as needed for dry eyes. 06/17/21   Raiford Noble Latif, DO  rosuvastatin (CRESTOR) 5 MG tablet Take 5 mg by mouth every Monday, Wednesday, and Friday. In the morning 04/19/18   [provider]  sodium chloride (OCEAN) 0.65 % SOLN nasal spray Place 1 spray into both nostrils as needed for congestion. 06/17/21   Raiford Noble Latif, DO  sulfamethoxazole-trimethoprim (BACTRIM DS) 800-160 MG tablet Take 1 tablet by mouth 3 (three) times a week. 06/20/21   Raiford Noble Latif, DO  tamsulosin (FLOMAX) 0.4 MG CAPS capsule Take 1 capsule (0.4 mg total) by mouth daily. 03/25/21   Vaillancourt, Aldona Bar, PA-C  zinc sulfate 220 (50 Zn) MG capsule Take 1 capsule (220 mg total) by mouth daily. 05/14/21   Loletha Grayer, MD  No Known Allergies  Family History  Problem Relation Age of Onset   Prostate cancer Neg Hx    Kidney cancer Neg Hx     Social History Social History   Tobacco Use   Smoking status: Former   Smokeless tobacco: Never  Scientific laboratory technician Use: Never used  Substance Use Topics   Alcohol use: Not Currently   Drug use: Not Currently    Review of Systems Constitutional: Negative for fever. Cardiovascular: Negative for chest pain. Respiratory: Positive for shortness of breath.  Positive for cough. Gastrointestinal: Negative for abdominal pain Musculoskeletal: Negative for musculoskeletal complaints Neurological: Negative for headache All other ROS negative  ____________________________________________   PHYSICAL EXAM:  VITAL SIGNS: ED Triage Vitals  Enc Vitals Group      BP 07/12/2021 1342 135/75     Pulse Rate 07/26/2021 1342 (!) 111     Resp 07/08/2021 1342 (!) 26     Temp 07/24/2021 1342 98.6 F (37 C)     Temp Source 07/14/2021 1342 Axillary     SpO2 07/08/2021 1336 (!) 51 %     Weight --      Height --      Head Circumference --      Peak Flow --      Pain Score 07/24/2021 1343 0     Pain Loc --      Pain Edu? --      Excl. in [place]? --     Constitutional: Alert and oriented.  Mild distress, tachypneic appears short of breath.  Speaking 1-2 word sentences Eyes: Normal exam ENT      Head: Normocephalic and atraumatic.      Mouth/Throat: Mucous membranes are moist. Cardiovascular: Normal rate, regular rhythm.  Respiratory: Increased work of breathing tachypneic around 30 breaths/min.  Expiratory wheeze auscultated with diminished breath sounds bilaterally. Gastrointestinal: Soft, mild distention but nontender. Musculoskeletal: Nontender with normal range of motion in all extremities Neurologic:  Normal speech and language. No gross focal neurologic deficits Psychiatric: Mood and affect are normal.   ____________________________________________    EKG  EKG viewed and interpreted by myself shows sinus tachycardia 114 bpm with a narrow QRS, left axis deviation, largely normal intervals with nonspecific ST changes.  ____________________________________________    RADIOLOGY  Chest x-ray shows pulmonary fibrosis with interstitial and alveolar opacities suspicious for pneumonia versus edema.  ____________________________________________   INITIAL IMPRESSION / ASSESSMENT AND PLAN / ED COURSE  Pertinent labs & imaging results that were available during my care of the patient were reviewed by me and considered in my medical decision making (see chart for details).   Patient presents to the emergency department for worsening shortness of breath and cough.  Patient initially satting in the 50s on EMS CPAP switched to BiPAP at 100% oxygen currently satting in  the upper 90s and appears much more comfortable.  Patient has a history of interstitial lung disease/pulmonary fibrosis.  We will treat with Solu-Medrol, given wheeze with diminished breath sounds bilaterally we will treat with duo nebs.  We will check labs, chest x-ray and continue to closely monitor.  Patient agreeable to plan of care.  No reported fever.  No chest pain.  Patient's chest x-ray shows pulmonary fibrosis along with interstitial and alveolar opacities concerning for superimposed pneumonia versus edema.  Given the patient's significant hypoxia upon presentation we will cover with antibiotics I have added on a BNP as well as a procalcitonin to help [place] differentiate pneumonia versus edema.  We will also dose 60 mg of IV Lasix while awaiting further results.  Patient's white blood cell count is normal.  Chemistry shows no significant findings.  Troponin negative.  Patient will require admission to the hospital service remains on BiPAP but is much improved compared to arrival.  JONAVIN SEDER was evaluated in Emergency Department on 07/05/2021 for the symptoms described in the history of present illness. He was evaluated in the context of the global COVID-19 pandemic, which necessitated consideration that the patient might be at risk for infection with the SARS-CoV-2 virus that causes COVID-19. Institutional protocols and algorithms that pertain to the evaluation of patients at risk for COVID-19 are in a state of rapid change based on information released by regulatory bodies including the CDC and federal and state organizations. These policies and algorithms were followed during the patient's care in the ED.  CRITICAL CARE Performed by: Harvest Dark   Total critical care time: 30 minutes  Critical care time was exclusive of separately billable procedures and treating other patients.  Critical care was necessary to treat or prevent imminent or life-threatening deterioration.  Critical  care was time spent personally by me on the following activities: development of treatment plan with patient and/or surrogate as well as nursing, discussions with consultants, evaluation of patient's response to treatment, examination of patient, obtaining history from patient or surrogate, ordering and performing treatments and interventions, ordering and review of laboratory studies, ordering and review of radiographic studies, pulse oximetry and re-evaluation of patient's condition.  ____________________________________________   FINAL CLINICAL IMPRESSION(S) / ED DIAGNOSES  Dyspnea Hypoxia HCAP   Harvest Dark, MD 07/05/2021 1443

## 2021-07-22 ENCOUNTER — Inpatient Hospital Stay (HOSPITAL_COMMUNITY)
Admit: 2021-07-22 | Discharge: 2021-07-22 | Disposition: A | Discharge status: Home / Self Care | Payer: BC Managed Care – PPO | Attending: Pulmonary Disease | Admitting: Pulmonary Disease

## 2021-07-22 DIAGNOSIS — I5021 Acute systolic (congestive) heart failure: Secondary | ICD-10-CM | POA: Diagnosis not present

## 2021-07-22 DIAGNOSIS — J9620 Acute and chronic respiratory failure, unspecified whether with hypoxia or hypercapnia: Secondary | ICD-10-CM | POA: Diagnosis not present

## 2021-07-22 LAB — RESPIRATORY PANEL BY PCR

## 2021-07-22 LAB — GLUCOSE, CAPILLARY
Glucose-Capillary: 111 mg/dL — ABNORMAL HIGH (ref 70–99)
Glucose-Capillary: 160 mg/dL — ABNORMAL HIGH (ref 70–99)
Glucose-Capillary: 163 mg/dL — ABNORMAL HIGH (ref 70–99)
Glucose-Capillary: 170 mg/dL — ABNORMAL HIGH (ref 70–99)
Glucose-Capillary: 179 mg/dL — ABNORMAL HIGH (ref 70–99)
Glucose-Capillary: 182 mg/dL — ABNORMAL HIGH (ref 70–99)
Glucose-Capillary: 188 mg/dL — ABNORMAL HIGH (ref 70–99)

## 2021-07-22 LAB — BASIC METABOLIC PANEL
Anion gap: 6 (ref 5–15)
BUN: 22 mg/dL (ref 8–23)
CO2: 36 mmol/L — ABNORMAL HIGH (ref 22–32)
Calcium: 8.4 mg/dL — ABNORMAL LOW (ref 8.9–10.3)
Chloride: 98 mmol/L (ref 98–111)
Creatinine, Ser: 0.53 mg/dL — ABNORMAL LOW (ref 0.61–1.24)
GFR, Estimated: >60 mL/min (ref >60)
Glucose, Bld: 199 mg/dL — ABNORMAL HIGH (ref 70–99)
Potassium: 3.8 mmol/L (ref 3.5–5.1)
Sodium: 140 mmol/L (ref 135–145)

## 2021-07-22 LAB — ECHOCARDIOGRAM COMPLETE
AR max vel: 2.45 cm2
AV Area VTI: 2.65 cm2
AV Area mean vel: 2.55 cm2
AV Mean grad: 6 mmHg
AV Peak grad: 10.6 mmHg
Ao pk vel: 1.63 m/s
Area-P 1/2: 4.24 cm2
Height: 68 in
MV VTI: 3.07 cm2
S' Lateral: 3 cm
Weight: 3671.98 oz

## 2021-07-22 LAB — CBC
HCT: 29.2 % — ABNORMAL LOW (ref 39.0–52.0)
Hemoglobin: 9.7 g/dL — ABNORMAL LOW (ref 13.0–17.0)
MCH: 31.5 pg (ref 26.0–34.0)
MCHC: 33.2 g/dL (ref 30.0–36.0)
MCV: 94.8 fL (ref 80.0–100.0)
Platelets: 214 10*3/uL (ref 150–400)
RBC: 3.08 MIL/uL — ABNORMAL LOW (ref 4.22–5.81)
RDW: 14.5 % (ref 11.5–15.5)
WBC: 7.5 10*3/uL (ref 4.0–10.5)
nRBC: 0.4 % — ABNORMAL HIGH (ref 0.0–0.2)

## 2021-07-22 LAB — HEMOGLOBIN A1C
Hgb A1c MFr Bld: 7.8 % — ABNORMAL HIGH (ref 4.8–5.6)
Mean Plasma Glucose: 177 mg/dL

## 2021-07-22 LAB — C-REACTIVE PROTEIN: CRP: 14.6 mg/dL — ABNORMAL HIGH (ref <1.0)

## 2021-07-22 MED ORDER — DEXAMETHASONE SODIUM PHOSPHATE 10 MG/ML IJ SOLN
6.0000 mg | Freq: Two times a day (BID) | INTRAMUSCULAR | Status: DC
Start: 2021-07-23 — End: 2021-07-23
  Administered 2021-07-23: 6 mg via INTRAVENOUS
  Filled 2021-07-22 (×2): qty 0.6

## 2021-07-22 MED ORDER — DEXAMETHASONE SODIUM PHOSPHATE 10 MG/ML IJ SOLN: 6.0000 mg | INTRAMUSCULAR | Status: DC

## 2021-07-22 MED ORDER — FUROSEMIDE 10 MG/ML IJ SOLN
40.0000 mg | Freq: Two times a day (BID) | INTRAMUSCULAR | Status: DC
  Administered 2021-07-22 – 2021-07-24 (×5): 40 mg via INTRAVENOUS
  Filled 2021-07-22 (×5): qty 4

## 2021-07-22 MED ORDER — ALBUMIN HUMAN 25 % IV SOLN
25.0000 g | Freq: Every day | INTRAVENOUS | Status: DC
  Administered 2021-07-22 – 2021-07-24 (×3): 25 g via INTRAVENOUS
  Filled 2021-07-22 (×3): qty 100

## 2021-07-22 MED ORDER — GUAIFENESIN-CODEINE 100-10 MG/5ML PO SOLN
10.0000 mL | ORAL | Status: DC | PRN
  Administered 2021-07-23 (×2): 10 mL via ORAL
  Filled 2021-07-22 (×2): qty 10

## 2021-07-22 MED ORDER — METHYLPREDNISOLONE SODIUM SUCC 125 MG IJ SOLR: 125.0000 mg | Freq: Every day | INTRAMUSCULAR | Status: DC

## 2021-07-22 MED ORDER — DEXAMETHASONE SODIUM PHOSPHATE 10 MG/ML IJ SOLN
6.0000 mg | INTRAMUSCULAR | Status: DC
  Administered 2021-07-22: 6 mg via INTRAVENOUS
  Filled 2021-07-22: qty 0.6

## 2021-07-22 MED ORDER — FUROSEMIDE 10 MG/ML IJ SOLN
40.0000 mg | Freq: Once | INTRAMUSCULAR | Status: AC
  Administered 2021-07-22: 40 mg via INTRAVENOUS
  Filled 2021-07-22: qty 4

## 2021-07-22 MED ORDER — LORAZEPAM 2 MG/ML IJ SOLN
0.5000 mg | Freq: Four times a day (QID) | INTRAMUSCULAR | Status: DC | PRN
  Administered 2021-07-23 – 2021-07-24 (×4): 0.5 mg via INTRAVENOUS
  Filled 2021-07-22 (×4): qty 1

## 2021-07-22 MED ORDER — PERFLUTREN LIPID MICROSPHERE
1.0000 mL | INTRAVENOUS | Status: AC | PRN
Start: 2021-07-22 — End: 2021-07-22
  Administered 2021-07-22: 4 mL via INTRAVENOUS
  Filled 2021-07-22: qty 10

## 2021-07-22 MED ORDER — MYCOPHENOLATE MOFETIL 250 MG PO CAPS
250.0000 mg | ORAL_CAPSULE | Freq: Two times a day (BID) | ORAL | Status: DC
  Administered 2021-07-22 – 2021-07-24 (×5): 250 mg via ORAL
  Filled 2021-07-22 (×6): qty 1

## 2021-07-22 MED ORDER — OXYMETAZOLINE HCL 0.05 % NA SOLN
1.0000 | Freq: Two times a day (BID) | NASAL | Status: AC
  Administered 2021-07-22 – 2021-07-24 (×6): 1 via NASAL
  Filled 2021-07-22: qty 15

## 2021-07-22 MED ORDER — LABETALOL HCL 5 MG/ML IV SOLN: 5.0000 mg | INTRAVENOUS | Status: DC | PRN

## 2021-07-22 NOTE — Progress Notes (Signed)
*  PRELIMINARY RESULTS* Echocardiogram 2D Echocardiogram has been performed.  Dylan Pittman 07/22/2021, 8:51 AM

## 2021-07-22 NOTE — Progress Notes (Signed)
NAME:  Dylan Pittman, MRN:  [id number], DOB:  Oct 03, 1950, LOS: 1 ADMISSION DATE:  07/16/2021, CONSULTATION DATE:  05/30/21 REFERRING MD:  Dr. Creig Hines, CHIEF COMPLAINT:   Shortness of breath  History of Present Illness:   70 year old male presenting to [facility] ED from home on 07/09/2021 with complaints of progressive hypoxia at rest/with exertion and dyspnea with exertion.  Of note patient was recently hospitalized with COVID-19 pneumonia from 05/09/2021 to 05/13/2021 and 05/2021 for Acute exacerbation of ILD .  He was released from rehab facility only 2 days ago from previous discharge.  He shares things have been improving and he has been in usual state of health ambulatory with onset of dyspnea and cough after being home 2nd day.  He denies chest pain, does endorse onging LE edema.  He denies flu like illness.  He was unable to reach normoxia on maximal settings of supplemental O2 >6L while at home.  He requires BIPAP during my evaluation with setting of 90%.  He admits to epistaxis.  He denies chest pain, nausea/vomiting/diarrhea/abdominal pain, fever/chills, or sore throat.   07/22/21- patient had trial of HFNC but desaturated and requires back to bipap. Reviewed case with wife and patient.    Pertinent  Medical History  Pulmonary fibrosis Hypertension Hyperlipidemia Type 2 diabetes Obstructive sleep apnea COVID-19 (September 2022) Prostate cancer status post seed ablation and Lupron injections BPH GERD Bronchitis Significant Hospital Events: Including procedures, antibiotic start and stop dates in addition to other pertinent events     Objective   Blood pressure 117/72, pulse 85, temperature 99 F (37.2 C), temperature source Oral, resp. rate (!) 21, height 5\' 8"  (1.727 m), weight 104.1 kg, SpO2 99 %.    FiO2 (%):  [50 %-70 %] 50 %   Intake/Output Summary (Last 24 hours) at 07/22/2021 7989 Last data filed at 07/22/2021 2119 Gross per 24 hour  Intake 720 ml  Output --  Net 720 ml    Filed Weights   07/27/2021 1344 07/26/2021 2120  Weight: 105.7 kg 104.1 kg    Examination: General: Adult male, acutely ill, lying in bed, NAD HEENT: MM pink/moist, anicteric, atraumatic, neck supple Neuro: A&O x 4, able to follow commands, PERRL +3, MAE CV: s1s2 RRR, sinus tachycardia on monitor, no r/m/g Pulm: Rmild rhonchi bilaterally  breath sounds coarse/diminished-BUL & diminished-BLL GI: soft, rounded, non tender, bs x 4 Skin: Limited exam-patient still in street clothes> no rashes/lesions noted Extremities: warm/dry, pulses + 2 R/P, 2+ edema noted BLE  Resolved Hospital Problem list     Assessment & Plan:  Acute Hypoxic Respiratory Failure       Post COVID ILD with pulmonary fibrosis and currently acute exacerbation of ILD  - may switch to HFNC  O2 to maintain SpO2 > 88% -RVP to rule out viral induced AEILD -ABG - Intermittent chest x-ray & ABG PRN - Ensure adequate pulmonary hygiene: IS Q 1 h x 10 while awake - steroids to dexamethasone 6 bid  -cellcept 250 bid PO - dcd home Vitamin C & Zinc -increased protonix to bid -Dc antibiotics today - DC VANCO dc Cefepime   Type 2 Diabetes Mellitus At Risk for Steroid Induced Hyperglycemia - Monitor CBG AC, HS - SSI moderate dosing, continue home long-acting coverage (solostar 40 units daily) >> Lantus 20 units BID - target range while in ICU: 140-180 - follow ICU hyper/hypo-glycemia protocol - Carb-modified diet in place -contiue dexamethasone at current dose   BPH - continue home flomax -  strict I & O's, monitor for urinary retention  Hyperlipidemia  Hypertension - continue home rosuvastatin - due to SIRS response to suspected Pneumonia with marginal-normal BP, will hold outpatient anti-hypertensives. Consider restarting: Losartan as patient stabilizes - PRN hydralazine if SBP > 160  Best Practice (right click and "Reselect all SmartList Selections" daily)  Diet/type: Regular consistency (see orders) DVT  prophylaxis: LMWH GI prophylaxis: PPI Lines: N/A Foley:  N/A Code Status:  full code Last date of multidisciplinary goals of care discussion [05/30/2021]  IMAGING     Recent Labs  Lab 07/11/2021 1408 07/22/21 0537  WBC 10.4 7.5  HGB 10.2* 9.7*  HCT 31.5* 29.2*  MCV 94.6 94.8  PLT 230 214     Basic Metabolic Panel: Recent Labs  Lab 07/06/2021 1340 07/22/21 0537  NA 140 140  K 3.6 3.8  CL 98 98  CO2 36* 36*  GLUCOSE 160* 199*  BUN 21 22  CREATININE 0.69 0.53*  CALCIUM 8.6* 8.4*    GFR: Estimated Creatinine Clearance: 103.4 mL/min (A) (by C-G formula based on SCr of 0.53 mg/dL (L)). Recent Labs  Lab 07/20/2021 1340 07/19/2021 1408 07/18/2021 1829 07/22/21 0537  PROCALCITON <0.10  --   --   --   WBC  --  10.4  --  7.5  LATICACIDVEN 2.5*  --  1.1  --      Liver Function Tests: Recent Labs  Lab 07/20/2021 1340  AST 31  ALT 30  ALKPHOS 94  BILITOT 0.7  PROT 7.0  ALBUMIN 3.2*    No results for input(s): LIPASE, AMYLASE in the last 168 hours. No results for input(s): AMMONIA in the last 168 hours.  ABG    Component Value Date/Time   PHART 7.41 07/10/2021 1606   PCO2ART 62 (H) 07/19/2021 1606   PO2ART 142 (H) 07/12/2021 1606   HCO3 39.3 (H) 07/08/2021 1606   O2SAT 99.2 07/11/2021 1606     Coagulation Profile: No results for input(s): INR, PROTIME in the last 168 hours.  Cardiac Enzymes: No results for input(s): CKTOTAL, CKMB, CKMBINDEX, TROPONINI in the last 168 hours.  HbA1C: Hgb A1c MFr Bld  Date/Time Value Ref Range Status  05/09/2021 06:18 AM 8.6 (H) 4.8 - 5.6 % Final    Comment:    (NOTE) Pre diabetes:          5.7%-6.4%  Diabetes:              >6.4%  Glycemic control for   <7.0% adults with diabetes     CBG: Recent Labs  Lab 07/22/2021 1835 07/07/2021 2122 07/22/21 0001 07/22/21 0453 07/22/21 0749  GLUCAP 194* 231* 188* 182* 179*    Review of Systems: Positives in BOLD   Gen: Denies fever, chills, weight change, fatigue, night  sweats HEENT: Denies blurred vision, double vision, hearing loss, tinnitus, sinus congestion, rhinorrhea, sore throat, neck stiffness, dysphagia PULM: Denies shortness of breath, cough, sputum production, hemoptysis, wheezing CV: Denies chest pain, edema, orthopnea, paroxysmal nocturnal dyspnea, palpitations GI: Denies abdominal pain, nausea, vomiting, diarrhea, hematochezia, melena, constipation, change in bowel habits GU: Denies dysuria, hematuria, polyuria, oliguria, urethral discharge Endocrine: Denies hot or cold intolerance, polyuria, polyphagia or appetite change Derm: Denies rash, dry skin, scaling or peeling skin change Heme: Denies easy bruising, bleeding, bleeding gums Neuro: Denies headache, numbness, weakness, slurred speech, loss of memory or consciousness  Past Medical History:  He,  has a past medical history of Chronic airway obstruction ([place]), DDD (degenerative disc disease), lumbar, Dupuytren's disease,  Dyslipidemia (04/07/2014), Esophageal reflux (04/20/2014), Hypertension (04/20/2014), Microalbuminuria (04/07/2014), Microalbuminuria, Obesity, unspecified (04/07/2014), Sleep apnea (04/20/2014), and 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   Surgical History:   Past Surgical History:  Procedure Laterality Date   BACK SURGERY  1992, 2005   CLOSED REDUCTION FINGER WITH PERCUTANEOUS PINNING Left 02/12/2020   Procedure: Closed reduction and pinning of left first metacarpal fracture with possible open reduction;  Surgeon: Hessie Knows, MD;  Location: [facility] ORS;  Service: Orthopedics;  Laterality: Left;   COLONOSCOPY WITH PROPOFOL N/A 12/28/2017   Procedure: COLONOSCOPY WITH PROPOFOL;  Surgeon: Manya Silvas, MD;  Location[facility] ENDOSCOPY;  Service: Endoscopy;  Laterality: N/A;   NASAL SEPTUM SURGERY     RADIOACTIVE SEED IMPLANT N/A 04/12/2021   Procedure: RADIOACTIVE SEED IMPLANT/BRACHYTHERAPY IMPLANT;  Surgeon: Abbie Sons, MD;  Location: [facility] ORS;   Service: Urology;  Laterality: N/A;  73 seeds implanted   VASECTOMY  2005     Social History:   reports that he has quit smoking. He has never used smokeless tobacco. He reports that he does not currently use alcohol. He reports that he does not currently use drugs.   Family History:  His family history is negative for Prostate cancer and Kidney cancer.   Allergies No Known Allergies   Home Medications  Prior to Admission medications   Medication Sig Start Date End Date Taking? Authorizing Provider  albuterol (VENTOLIN HFA) 108 (90 Base) MCG/ACT inhaler Inhale 2 puffs into the lungs every 6 (six) hours as needed for wheezing or shortness of breath. 01/20/20  Yes [provider]  ascorbic acid (VITAMIN C) 1000 MG tablet Take 0.5 tablets (500 mg total) by mouth daily. 05/14/21  Yes Wieting, Richard, MD  cholecalciferol (VITAMIN D) 25 MCG (1000 UNIT) tablet Take 1,000 Units by mouth in the morning.   Yes [provider]  fluticasone-salmeterol (ADVAIR HFA) 115-21 MCG/ACT inhaler Inhale 2 puffs into the lungs 2 (two) times daily.   Yes [provider]  furosemide (LASIX) 20 MG tablet Take 20 mg by mouth daily. 05/26/21  Yes [provider]  gabapentin (NEURONTIN) 300 MG capsule Take 300 mg by mouth 3 (three) times daily. 05/26/21  Yes [provider]  insulin aspart (NOVOLOG) 100 UNIT/ML FlexPen Inject 8 Units into the skin 3 (three) times daily with meals. Okay to substitute generic 05/13/21  Yes Wieting, Richard, MD  losartan (COZAAR) 50 MG tablet Take 50 mg by mouth in the morning. 10/07/17  Yes [provider]  OZEMPIC, 1 MG/DOSE, 4 MG/3ML SOPN Inject 1 mg into the skin every Sunday. 07/29/19  Yes [provider]  pantoprazole (PROTONIX) 40 MG tablet Take 40 mg by mouth 2 (two) times daily. 10/19/17  Yes [provider]  pioglitazone (ACTOS) 30 MG tablet Take 30 mg by mouth in the morning. 07/27/19  Yes [provider]   rosuvastatin (CRESTOR) 5 MG tablet Take 5 mg by mouth every Monday, Wednesday, and Friday. In the morning 04/19/18  Yes [provider]  tamsulosin (FLOMAX) 0.4 MG CAPS capsule Take 1 capsule (0.4 mg total) by mouth daily. 03/25/21  Yes Vaillancourt, Samantha, PA-C  TOUJEO MAX SOLOSTAR 300 UNIT/ML Solostar Pen Inject 50 Units into the skin in the morning. Patient taking differently: Inject 40 Units into the skin in the morning. 05/13/21  Yes Wieting, Richard, MD  XIGDUO XR 12-998 MG TB24 Take 2 tablets by mouth every morning. 01/31/21  Yes [provider]  zinc sulfate 220 (  50 Zn) MG capsule Take 1 capsule (220 mg total) by mouth daily. 05/14/21  Yes Wieting, Richard, MD  aspirin EC 81 MG tablet Take 81 mg by mouth in the morning. Swallow whole. Patient not taking: Reported on 05/30/2021    [provider]  chlorpheniramine-HYDROcodone (TUSSIONEX) 10-8 MG/5ML SUER Take 5 mLs by mouth every 12 (twelve) hours as needed for cough. Patient not taking: No sig reported 05/13/21   Loletha Grayer, MD  feeding supplement, GLUCERNA SHAKE, (GLUCERNA SHAKE) LIQD Take 237 mLs by mouth 2 (two) times daily between meals. 05/13/21   Loletha Grayer, MD  fluticasone-salmeterol (ADVAIR HFA) 509-32 MCG/ACT inhaler Inhale 2 puffs into the lungs 2 (two) times daily. Rinse mouth out with water after use Patient not taking: No sig reported 05/13/21   Loletha Grayer, MD  Insulin Pen Needle 33G X 4 MM MISC 1 Dose by Does not apply route 3 (three) times daily before meals. 05/13/21   Loletha Grayer, MD  predniSONE (DELTASONE) 10 MG tablet Three tabs po daily for five days Patient not taking: No sig reported 05/13/21   Loletha Grayer, MD     Critical care provider statement:   Total critical care time: 33 minutes   Performed by: Lanney Gins MD   Critical care time was exclusive of separately billable procedures and treating other patients.   Critical care was necessary to treat or prevent  imminent or life-threatening deterioration.   Critical care was time spent personally by me on the following activities: development of treatment plan with patient and/or surrogate as well as nursing, discussions with consultants, evaluation of patient's response to treatment, examination of patient, obtaining history from patient or surrogate, ordering and performing treatments and interventions, ordering and review of laboratory studies, ordering and review of radiographic studies, pulse oximetry and re-evaluation of patient's condition.    Ottie Glazier, M.D.  Pulmonary & Critical Care Medicine

## 2021-07-22 NOTE — Progress Notes (Signed)
  Progress Note    Dylan Pittman   GDJ:[id number]  DOB: 10/12/1950  DOA: 07/05/2021     1 Date of Service: 07/22/2021   Clinical Course 11/24 admitted with worsening shortness of breath on top of history of pulmonary fibrosis. Pulmonary consulted.  Started on BiPAP. 11/25 Foley catheter inserted for aggressive diuresis on critically ill patient.   Assessment and Plan Acute on chronic hypoxic respiratory failure. Pulmonary fibrosis. Post COVID ILD. Started on BiPAP therapy. Uses 16 L of oxygen at home. Will attempt to switch to HFNC although patient is a mouth breather, therefore it might not be successful. On CellCept and Decadron. Minimizing p.o. medication as the patient is requiring BiPAP therapy. No concern for pneumonia.  Antibiotics discontinued.  Type 2 diabetes mellitus. Hyperglycemia secondary to steroids. Continue sliding scale insulin. On Lantus 50 units.  BPH. On Flomax. Has some retention as well as required aggressive diuresis therefore Foley catheter inserted.  Acute on chronic diastolic CHF. Hypertension Hyperlipidemia Continue Lasix 40 twice daily. Continue Crestor Monitor.  Obesity Placing the patient at high risk of poor outcome. Body mass index is 34.9 kg/m.   Neuropathy. Holding gabapentin minimizing p.o. medication.  Chronic Bactrim use. Patient is not aware of the indication of this medication. Will continue for now. Likely prescribed by urology.   Subjective:  Continues to have shortness of breath.  No nausea no vomiting.  No chest pain.  Objective Vitals:   07/22/21 1600 07/22/21 1700 07/22/21 1740 07/22/21 1800  BP: 113/77 118/87  114/65  Pulse: 91 92  88  Resp: (!) 22 (!) 31  19  Temp:      TempSrc:      SpO2: 100% 100% 100% 100%  Weight:      Height:       104.1 kg  Tachypneic and tachycardic.  Exam General: Appear in mild distress, no Rash; Oral Mucosa Clear, moist. no Abnormal Neck Mass Or lumps, Conjunctiva  normal  Cardiovascular: S1 and S2 Present, no Murmur, Respiratory: increased respiratory effort, Bilateral Air entry present and bilateral  Crackles, no wheezes Abdomen: Bowel Sound present, Soft and no tenderness Extremities: trace Pedal edema Neurology: alert and oriented to time, place, and person affect appropriate. no new focal deficit Gait not checked due to patient safety concerns     Labs / Other Information Hemoglobin stable.  WBC stable. Echocardiogram shows preserved EF with diastolic dysfunction.  No significant valvular abnormality.   Disposition Plan: Status is: Inpatient  Remains inpatient appropriate because: Requiring stepdown therapy on BiPAP.  At risk for decompensation.  DNR.  Time spent: 35 minutes [facility] 07/22/2021, 6:35 PM

## 2021-07-23 DIAGNOSIS — Z7189 Other specified counseling: Secondary | ICD-10-CM

## 2021-07-23 DIAGNOSIS — J206 Acute bronchitis due to rhinovirus: Secondary | ICD-10-CM | POA: Diagnosis present

## 2021-07-23 DIAGNOSIS — N181 Chronic kidney disease, stage 1: Secondary | ICD-10-CM | POA: Diagnosis present

## 2021-07-23 DIAGNOSIS — J9620 Acute and chronic respiratory failure, unspecified whether with hypoxia or hypercapnia: Secondary | ICD-10-CM | POA: Diagnosis not present

## 2021-07-23 DIAGNOSIS — E876 Hypokalemia: Secondary | ICD-10-CM | POA: Diagnosis present

## 2021-07-23 DIAGNOSIS — I5033 Acute on chronic diastolic (congestive) heart failure: Secondary | ICD-10-CM | POA: Diagnosis present

## 2021-07-23 LAB — GLUCOSE, CAPILLARY
Glucose-Capillary: 117 mg/dL — ABNORMAL HIGH (ref 70–99)
Glucose-Capillary: 171 mg/dL — ABNORMAL HIGH (ref 70–99)
Glucose-Capillary: 220 mg/dL — ABNORMAL HIGH (ref 70–99)
Glucose-Capillary: 271 mg/dL — ABNORMAL HIGH (ref 70–99)
Glucose-Capillary: 278 mg/dL — ABNORMAL HIGH (ref 70–99)
Glucose-Capillary: 99 mg/dL (ref 70–99)

## 2021-07-23 LAB — BASIC METABOLIC PANEL
Anion gap: 7 (ref 5–15)
BUN: 24 mg/dL — ABNORMAL HIGH (ref 8–23)
CO2: 40 mmol/L — ABNORMAL HIGH (ref 22–32)
Calcium: 9 mg/dL (ref 8.9–10.3)
Chloride: 96 mmol/L — ABNORMAL LOW (ref 98–111)
Creatinine, Ser: 0.52 mg/dL — ABNORMAL LOW (ref 0.61–1.24)
GFR, Estimated: >60 mL/min (ref >60)
Glucose, Bld: 113 mg/dL — ABNORMAL HIGH (ref 70–99)
Potassium: 3.3 mmol/L — ABNORMAL LOW (ref 3.5–5.1)
Sodium: 143 mmol/L (ref 135–145)

## 2021-07-23 LAB — CBC
HCT: 29.2 % — ABNORMAL LOW (ref 39.0–52.0)
Hemoglobin: 9.5 g/dL — ABNORMAL LOW (ref 13.0–17.0)
MCH: 30.7 pg (ref 26.0–34.0)
MCHC: 32.5 g/dL (ref 30.0–36.0)
MCV: 94.5 fL (ref 80.0–100.0)
Platelets: 236 10*3/uL (ref 150–400)
RBC: 3.09 MIL/uL — ABNORMAL LOW (ref 4.22–5.81)
RDW: 15.1 % (ref 11.5–15.5)
WBC: 8.5 10*3/uL (ref 4.0–10.5)
nRBC: 0.2 % (ref 0.0–0.2)

## 2021-07-23 LAB — PHOSPHORUS: Phosphorus: 3.9 mg/dL (ref 2.5–4.6)

## 2021-07-23 LAB — MAGNESIUM: Magnesium: 2.3 mg/dL (ref 1.7–2.4)

## 2021-07-23 MED ORDER — BISACODYL 10 MG RE SUPP: 10.0000 mg | Freq: Every day | RECTAL | Status: DC | PRN

## 2021-07-23 MED ORDER — POTASSIUM CHLORIDE CRYS ER 20 MEQ PO TBCR
40.0000 meq | EXTENDED_RELEASE_TABLET | Freq: Once | ORAL | Status: AC
  Administered 2021-07-23: 40 meq via ORAL
  Filled 2021-07-23: qty 2

## 2021-07-23 MED ORDER — SENNOSIDES-DOCUSATE SODIUM 8.6-50 MG PO TABS
1.0000 | ORAL_TABLET | Freq: Two times a day (BID) | ORAL | Status: DC
  Administered 2021-07-23 – 2021-07-24 (×4): 1 via ORAL
  Filled 2021-07-23 (×5): qty 1

## 2021-07-23 MED ORDER — DOCUSATE SODIUM 100 MG PO CAPS
100.0000 mg | ORAL_CAPSULE | Freq: Two times a day (BID) | ORAL | Status: DC
  Administered 2021-07-23: 100 mg via ORAL
  Filled 2021-07-23: qty 1

## 2021-07-23 MED ORDER — HYDROCOD POLST-CPM POLST ER 10-8 MG/5ML PO SUER
5.0000 mL | Freq: Two times a day (BID) | ORAL | Status: DC | PRN
  Administered 2021-07-23 – 2021-07-24 (×2): 5 mL via ORAL
  Filled 2021-07-23 (×2): qty 5

## 2021-07-23 MED ORDER — SENNOSIDES-DOCUSATE SODIUM 8.6-50 MG PO TABS: 1.0000 | ORAL_TABLET | Freq: Two times a day (BID) | ORAL | Status: DC | PRN

## 2021-07-23 MED ORDER — BENZONATATE 100 MG PO CAPS
100.0000 mg | ORAL_CAPSULE | Freq: Three times a day (TID) | ORAL | Status: DC | PRN
  Administered 2021-07-23 (×2): 100 mg via ORAL
  Filled 2021-07-23 (×2): qty 1

## 2021-07-23 MED ORDER — DEXAMETHASONE SODIUM PHOSPHATE 4 MG/ML IJ SOLN
4.0000 mg | Freq: Two times a day (BID) | INTRAMUSCULAR | Status: DC
Start: 2021-07-23 — End: 2021-07-25
  Administered 2021-07-23 – 2021-07-24 (×3): 4 mg via INTRAVENOUS
  Filled 2021-07-23 (×3): qty 1

## 2021-07-23 MED ORDER — SIMETHICONE 80 MG PO CHEW
80.0000 mg | CHEWABLE_TABLET | Freq: Four times a day (QID) | ORAL | Status: DC
  Administered 2021-07-23 – 2021-07-24 (×8): 80 mg via ORAL
  Filled 2021-07-23 (×10): qty 1

## 2021-07-23 MED ORDER — POTASSIUM CHLORIDE CRYS ER 20 MEQ PO TBCR
40.0000 meq | EXTENDED_RELEASE_TABLET | ORAL | Status: AC
  Administered 2021-07-23 (×2): 40 meq via ORAL
  Filled 2021-07-23 (×2): qty 2

## 2021-07-23 MED ORDER — GUAIFENESIN-DM 100-10 MG/5ML PO SYRP
5.0000 mL | ORAL_SOLUTION | Freq: Four times a day (QID) | ORAL | Status: DC
  Administered 2021-07-23 – 2021-07-24 (×7): 5 mL via ORAL
  Filled 2021-07-23 (×7): qty 5

## 2021-07-23 MED ORDER — POTASSIUM CHLORIDE 10 MEQ/100ML IV SOLN
10.0000 meq | INTRAVENOUS | Status: AC
  Administered 2021-07-23 (×2): 10 meq via INTRAVENOUS
  Filled 2021-07-23 (×2): qty 100

## 2021-07-23 NOTE — Assessment & Plan Note (Signed)
Potassium 3.3.  Currently being replaced.

## 2021-07-23 NOTE — Progress Notes (Signed)
NAME:  Dylan Pittman, MRN:  [id number], DOB:  1950/09/06, LOS: 2 ADMISSION DATE:  07/03/2021, CONSULTATION DATE:  05/30/21 REFERRING MD:  Dr. Creig Hines, CHIEF COMPLAINT:   Shortness of breath  History of Present Illness:   70 year old male presenting to [facility] ED from home on 07/01/2021 with complaints of progressive hypoxia at rest/with exertion and dyspnea with exertion.  Of note patient was recently hospitalized with COVID-19 pneumonia from 05/09/2021 to 05/13/2021 and 05/2021 for Acute exacerbation of ILD .  He was released from rehab facility only 2 days ago from previous discharge.  He shares things have been improving and he has been in usual state of health ambulatory with onset of dyspnea and cough after being home 2nd day.  He denies chest pain, does endorse onging LE edema.  He denies flu like illness.  He was unable to reach normoxia on maximal settings of supplemental O2 >6L while at home.  He requires BIPAP during my evaluation with setting of 90%.  He admits to epistaxis.  He denies chest pain, nausea/vomiting/diarrhea/abdominal pain, fever/chills, or sore throat.   07/22/21- patient had trial of HFNC but desaturated and requires back to bipap. Reviewed case with wife and patient.  07/23/21- patient is clincially imporved. RVP + for Rhinovirus.  Reviewed case with Dr Posey Pronto today. Met with wife yesterday reviewed medical plan.    Pertinent  Medical History  Pulmonary fibrosis Hypertension Hyperlipidemia Type 2 diabetes Obstructive sleep apnea COVID-19 (September 2022) Prostate cancer status post seed ablation and Lupron injections BPH GERD    Objective   Blood pressure (!) 149/78, pulse 84, temperature 98.6 F (37 C), temperature source Oral, resp. rate (!) 23, height 5' 8" (1.727 m), weight 104.1 kg, SpO2 98 %.    FiO2 (%):  [75 %-100 %] 100 %   Intake/Output Summary (Last 24 hours) at 07/23/2021 1751 Last data filed at 07/23/2021 0700 Gross per 24 hour  Intake 483.71 ml   Output 3000 ml  Net -2516.29 ml    Filed Weights   07/27/2021 1344 07/13/2021 2120  Weight: 105.7 kg 104.1 kg    Examination: General: Adult male, acutely ill, lying in bed, NAD HEENT: MM pink/moist, anicteric, atraumatic, neck supple Neuro: A&O x 4, able to follow commands, PERRL +3, MAE CV: s1s2 RRR, sinus tachycardia on monitor, no r/m/g Pulm: Rmild rhonchi bilaterally  breath sounds coarse/diminished-BUL & diminished-BLL GI: soft, rounded, non tender, bs x 4 Skin: Limited exam-patient still in street clothes> no rashes/lesions noted Extremities: warm/dry, pulses + 2 R/P, 2+ edema noted BLE    Assessment & Plan:  Acute Hypoxic Respiratory Failure       Post COVID ILD with pulmonary fibrosis and currently acute exacerbation of ILD  - may switch to HFNC  O2 to maintain SpO2 > 88% -RVP -rhinoevirus viral induced AEILD -ABG-improved - Intermittent chest x-ray & ABG PRN - Ensure adequate pulmonary hygiene: IS Q 1 h x 10 while awake - steroids to dexamethasone 6 bid -weaning protocol -cellcept 250 bid PO - dcd home Vitamin C & Zinc -increased protonix to bid -Dc antibiotics today - DC VANCO dc Cefepime   Type 2 Diabetes Mellitus At Risk for Steroid Induced Hyperglycemia - Monitor CBG AC, HS - SSI moderate dosing, continue home long-acting coverage (solostar 40 units daily) >> Lantus 20 units BID - target range while in ICU: 140-180 - follow ICU hyper/hypo-glycemia protocol - Carb-modified diet in place -contiue dexamethasone at current dose   BPH - continue home  flomax - strict I & O's, monitor for urinary retention  Hyperlipidemia  Hypertension - continue home rosuvastatin - due to SIRS response to suspected Pneumonia with marginal-normal BP, will hold outpatient anti-hypertensives. Consider restarting: Losartan as patient stabilizes - PRN hydralazine if SBP > 160  Best Practice (right click and "Reselect all SmartList Selections" daily)  Diet/type: Regular  consistency (see orders) DVT prophylaxis: LMWH GI prophylaxis: PPI Lines: N/A Foley:  N/A Code Status:  full code Last date of multidisciplinary goals of care discussion [05/30/2021]  IMAGING     Recent Labs  Lab 07/19/2021 1408 07/22/21 0537 07/23/21 0541  WBC 10.4 7.5 8.5  HGB 10.2* 9.7* 9.5*  HCT 31.5* 29.2* 29.2*  MCV 94.6 94.8 94.5  PLT 230 214 236     Basic Metabolic Panel: Recent Labs  Lab 07/16/2021 1340 07/22/21 0537 07/23/21 0541  NA 140 140 143  K 3.6 3.8 3.3*  CL 98 98 96*  CO2 36* 36* 40*  GLUCOSE 160* 199* 113*  BUN 21 22 24*  CREATININE 0.69 0.53* 0.52*  CALCIUM 8.6* 8.4* 9.0  MG  --   --  2.3  PHOS  --   --  3.9    GFR: Estimated Creatinine Clearance: 103.4 mL/min (A) (by C-G formula based on SCr of 0.52 mg/dL (L)). Recent Labs  Lab 07/08/2021 1340 07/02/2021 1408 07/10/2021 1829 07/22/21 0537 07/23/21 0541  PROCALCITON <0.10  --   --   --   --   WBC  --  10.4  --  7.5 8.5  LATICACIDVEN 2.5*  --  1.1  --   --      Liver Function Tests: Recent Labs  Lab 07/22/2021 1340  AST 31  ALT 30  ALKPHOS 94  BILITOT 0.7  PROT 7.0  ALBUMIN 3.2*    No results for input(s): LIPASE, AMYLASE in the last 168 hours. No results for input(s): AMMONIA in the last 168 hours.  ABG    Component Value Date/Time   PHART 7.41 07/05/2021 1606   PCO2ART 62 (H) 07/26/2021 1606   PO2ART 142 (H) 07/14/2021 1606   HCO3 39.3 (H) 07/09/2021 1606   O2SAT 99.2 07/16/2021 1606     Coagulation Profile: No results for input(s): INR, PROTIME in the last 168 hours.  Cardiac Enzymes: No results for input(s): CKTOTAL, CKMB, CKMBINDEX, TROPONINI in the last 168 hours.  HbA1C: Hgb A1c MFr Bld  Date/Time Value Ref Range Status  07/22/2021 05:37 AM 7.8 (H) 4.8 - 5.6 % Final    Comment:    (NOTE)         Prediabetes: 5.7 - 6.4         Diabetes: >6.4         Glycemic control for adults with diabetes: <7.0   05/09/2021 06:18 AM 8.6 (H) 4.8 - 5.6 % Final    Comment:     (NOTE) Pre diabetes:          5.7%-6.4%  Diabetes:              >6.4%  Glycemic control for   <7.0% adults with diabetes     CBG: Recent Labs  Lab 07/22/21 1609 07/22/21 1910 07/22/21 2342 07/23/21 0334 07/23/21 0743  GLUCAP 163* 170* 111* 117* 99     Review of Systems: Positives in BOLD   Gen: Denies fever, chills, weight change, fatigue, night sweats HEENT: Denies blurred vision, double vision, hearing loss, tinnitus, sinus congestion, rhinorrhea, sore throat, neck stiffness, dysphagia PULM: Denies   shortness of breath, cough, sputum production, hemoptysis, wheezing CV: Denies chest pain, edema, orthopnea, paroxysmal nocturnal dyspnea, palpitations GI: Denies abdominal pain, nausea, vomiting, diarrhea, hematochezia, melena, constipation, change in bowel habits GU: Denies dysuria, hematuria, polyuria, oliguria, urethral discharge Endocrine: Denies hot or cold intolerance, polyuria, polyphagia or appetite change Derm: Denies rash, dry skin, scaling or peeling skin change Heme: Denies easy bruising, bleeding, bleeding gums Neuro: Denies headache, numbness, weakness, slurred speech, loss of memory or consciousness  Past Medical History:  He,  has a past medical history of Chronic airway obstruction (HCC), DDD (degenerative disc disease), lumbar, Dupuytren's disease, Dyslipidemia (04/07/2014), Esophageal reflux (04/20/2014), Hypertension (04/20/2014), Microalbuminuria (04/07/2014), Microalbuminuria, Obesity, unspecified (04/07/2014), Sleep apnea (04/20/2014), and 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   Surgical History:   Past Surgical History:  Procedure Laterality Date   BACK SURGERY  1992, 2005   CLOSED REDUCTION FINGER WITH PERCUTANEOUS PINNING Left 02/12/2020   Procedure: Closed reduction and pinning of left first metacarpal fracture with possible open reduction;  Surgeon: Menz, Michael, MD;  Location: [facility] ORS;  Service: Orthopedics;   Laterality: Left;   COLONOSCOPY WITH PROPOFOL N/A 12/28/2017   Procedure: COLONOSCOPY WITH PROPOFOL;  Surgeon: Elliott, Robert T, MD;  Location: [facility] ENDOSCOPY;  Service: Endoscopy;  Laterality: N/A;   NASAL SEPTUM SURGERY     RADIOACTIVE SEED IMPLANT N/A 04/12/2021   Procedure: RADIOACTIVE SEED IMPLANT/BRACHYTHERAPY IMPLANT;  Surgeon: Stoioff, Scott C, MD;  Location: [facility] ORS;  Service: Urology;  Laterality: N/A;  73 seeds implanted   VASECTOMY  2005     Social History:   reports that he has quit smoking. He has never used smokeless tobacco. He reports that he does not currently use alcohol. He reports that he does not currently use drugs.   Family History:  His family history is negative for Prostate cancer and Kidney cancer.   Allergies No Known Allergies   Home Medications  Prior to Admission medications   Medication Sig Start Date End Date Taking? Authorizing Provider  albuterol (VENTOLIN HFA) 108 (90 Base) MCG/ACT inhaler Inhale 2 puffs into the lungs every 6 (six) hours as needed for wheezing or shortness of breath. 01/20/20  Yes [provider]  ascorbic acid (VITAMIN C) 1000 MG tablet Take 0.5 tablets (500 mg total) by mouth daily. 05/14/21  Yes Wieting, Richard, MD  cholecalciferol (VITAMIN D) 25 MCG (1000 UNIT) tablet Take 1,000 Units by mouth in the morning.   Yes [provider]  fluticasone-salmeterol (ADVAIR HFA) 115-21 MCG/ACT inhaler Inhale 2 puffs into the lungs 2 (two) times daily.   Yes [provider]  furosemide (LASIX) 20 MG tablet Take 20 mg by mouth daily. 05/26/21  Yes [provider]  gabapentin (NEURONTIN) 300 MG capsule Take 300 mg by mouth 3 (three) times daily. 05/26/21  Yes [provider]  insulin aspart (NOVOLOG) 100 UNIT/ML FlexPen Inject 8 Units into the skin 3 (three) times daily with meals. Okay to substitute generic 05/13/21  Yes Wieting, Richard, MD  losartan (COZAAR) 50 MG tablet Take 50 mg by mouth in the  morning. 10/07/17  Yes [provider]  OZEMPIC, 1 MG/DOSE, 4 MG/3ML SOPN Inject 1 mg into the skin every Sunday. 07/29/19  Yes [provider]  pantoprazole (PROTONIX) 40 MG tablet Take 40 mg by mouth 2 (two) times daily. 10/19/17  Yes [provider]  pioglitazone (ACTOS) 30 MG tablet Take 30 mg by mouth in the morning. 07/27/19  Yes [provider]    rosuvastatin (CRESTOR) 5 MG tablet Take 5 mg by mouth every Monday, Wednesday, and Friday. In the morning 04/19/18  Yes [provider]  tamsulosin (FLOMAX) 0.4 MG CAPS capsule Take 1 capsule (0.4 mg total) by mouth daily. 03/25/21  Yes Vaillancourt, Samantha, PA-C  TOUJEO MAX SOLOSTAR 300 UNIT/ML Solostar Pen Inject 50 Units into the skin in the morning. Patient taking differently: Inject 40 Units into the skin in the morning. 05/13/21  Yes Wieting, Richard, MD  XIGDUO XR 12-998 MG TB24 Take 2 tablets by mouth every morning. 01/31/21  Yes [provider]  zinc sulfate 220 (50 Zn) MG capsule Take 1 capsule (220 mg total) by mouth daily. 05/14/21  Yes Wieting, Richard, MD  aspirin EC 81 MG tablet Take 81 mg by mouth in the morning. Swallow whole. Patient not taking: Reported on 05/30/2021    [provider]  chlorpheniramine-HYDROcodone (TUSSIONEX) 10-8 MG/5ML SUER Take 5 mLs by mouth every 12 (twelve) hours as needed for cough. Patient not taking: No sig reported 05/13/21   Wieting, Richard, MD  feeding supplement, GLUCERNA SHAKE, (GLUCERNA SHAKE) LIQD Take 237 mLs by mouth 2 (two) times daily between meals. 05/13/21   Wieting, Richard, MD  fluticasone-salmeterol (ADVAIR HFA) 115-21 MCG/ACT inhaler Inhale 2 puffs into the lungs 2 (two) times daily. Rinse mouth out with water after use Patient not taking: No sig reported 05/13/21   Wieting, Richard, MD  Insulin Pen Needle 33G X 4 MM MISC 1 Dose by Does not apply route 3 (three) times daily before meals. 05/13/21   Wieting, Richard, MD  predniSONE  (DELTASONE) 10 MG tablet Three tabs po daily for five days Patient not taking: No sig reported 05/13/21   Wieting, Richard, MD     Critical care provider statement:   Total critical care time: 33 minutes   Performed by:  MD   Critical care time was exclusive of separately billable procedures and treating other patients.   Critical care was necessary to treat or prevent imminent or life-threatening deterioration.   Critical care was time spent personally by me on the following activities: development of treatment plan with patient and/or surrogate as well as nursing, discussions with consultants, evaluation of patient's response to treatment, examination of patient, obtaining history from patient or surrogate, ordering and performing treatments and interventions, ordering and review of laboratory studies, ordering and review of radiographic studies, pulse oximetry and re-evaluation of patient's condition.     , M.D.  Pulmonary & Critical Care Medicine            

## 2021-07-23 NOTE — Assessment & Plan Note (Signed)
RVP positive for rhinovirus. Continue supportive care. Acute exacerbation of his ILD secondary to rhinovirus.

## 2021-07-23 NOTE — Hospital Course (Signed)
11/24, admitted to the hospital. 11/25 on BiPAP.  Able to be weaned off. 11/26 continue diuresis.

## 2021-07-23 NOTE — Assessment & Plan Note (Signed)
11/26 had a conversation with patient regarding his goals of care at the request of RN.  Patient wants to try CPR in the event of cardiac arrest until his wife has a chance to say goodbye. He categorically mention that he does not want to be kept alive on any kind of mechanical support.  He is aware that in an event of intubation there is a significantly high chance that he will not be able to get extubated. He will discuss with his wife on 11/27 for further clarification as I mention that going through the whole CPR and intubation process is significantly painful and traumatizing. Patient in the meantime wants to be full code.

## 2021-07-23 NOTE — Assessment & Plan Note (Signed)
On CPAP at home.  Currently on BiPAP nightly and as needed.

## 2021-07-23 NOTE — Assessment & Plan Note (Signed)
Blood pressure stable.  Monitor. 

## 2021-07-23 NOTE — Assessment & Plan Note (Signed)
Secondary to COVID-19 now presenting with rhinovirus induced acute exacerbation.

## 2021-07-23 NOTE — Progress Notes (Addendum)
[facility] Progress Note  Patient: Dylan Pittman    TDD:[id number]  DOA: 07/27/2021    Date of Service: the patient was seen and examined on 07/23/2021  Brief hospital course: 11/24, admitted to the hospital. 11/25 on BiPAP.  Able to be weaned off. 11/26 continue diuresis.  Assessment and Plan: * Acute on chronic respiratory failure with hypoxia (HCC) At baseline uses 16 L of oxygen. When EMS arrived patient was satting 51%. Currently on heated high flow 100% FiO2. Also intermittent BiPAP as needed. Continue aggressive diuresis as well. Continue pulmonary toilet.  Continue Decadron.  Nebulizers. Appreciate pulmonary consultation.  Continue to remain in stepdown unit.  Acute bronchitis due to Rhinovirus RVP positive for rhinovirus. Continue supportive care. Acute exacerbation of his ILD secondary to rhinovirus.  ILD (interstitial lung disease) ([place]) Secondary to COVID-19 now presenting with rhinovirus induced acute exacerbation.  Acute on chronic diastolic CHF (congestive heart failure) (HCC) Echocardiogram shows preserved EF with grade 2 diastolic dysfunction without any significant valvular abnormality. Responding to IV diuresis. Continue IV Lasix 40 twice daily. Daily BMP.  Sleep apnea On CPAP at home.  Currently on BiPAP nightly and as needed.  Obesity, unspecified Body mass index is 34.9 kg/m.  Placing the patient at high risk of poor outcome.  Hypokalemia Potassium 3.3.  Currently being replaced.  Hypertension Blood pressure stable.  Monitor.  Goals of care, counseling/discussion 11/26 had a conversation with patient regarding his goals of care at the request of RN.  Patient wants to try CPR in the event of cardiac arrest until his wife has a chance to say goodbye. He categorically mention that he does not want to be kept alive on any kind of mechanical support.  He is aware that in an event of intubation there is a significantly high chance that he will  not be able to get extubated. He will discuss with his wife on 11/27 for further clarification as I mention that going through the whole CPR and intubation process is significantly painful and traumatizing. Patient in the meantime wants to be full code.  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 Monitor.  Benign prostatic hyperplasia without lower urinary tract symptoms Continue Flomax. Also Foley catheter inserted due to critical illness.  Type 2 diabetes mellitus with chronic kidney disease, with long-term current use of insulin (HCC) Hemoglobin A1c 7.8. At risk for steroid-induced hyperglycemia. Continue sliding scale insulin and Lantus. Body mass index is 34.9 kg/m.   Subjective: Cough.  No nausea vomiting or no fever no chills.  Sore throat and chest pain from coughing.  Had a BM.  Objective: Tachypneic and tachycardic.  Soft blood pressure.  Exam: General: Appear in mild distress, no Rash; Oral Mucosa Clear, moist. no Abnormal Neck Mass Or lumps, Conjunctiva normal  Cardiovascular: S1 and S2 Present, no Murmur, Respiratory: increased respiratory effort, Bilateral Air entry present and bilateral  Crackles, no wheezes Abdomen: Bowel Sound present, Soft and no tenderness Extremities: bilateral Pedal edema Neurology: alert and oriented to time, place, and person affect appropriate. no new focal deficit Gait not checked due to patient safety concerns    Data Reviewed: My review of labs, imaging, notes and other tests is significant for     low potassium.  Disposition:  Status is: Inpatient  Remains inpatient appropriate because: Ongoing IV diuresis and respiratory support requirement.   Family Communication: None at bedside.  DVT Prophylaxis: enoxaparin (LOVENOX) injection 40 mg Start: 07/16/2021 1545  Time spent: 35 minutes.   Author: Berle Mull  07/23/2021 4:42 PM  To reach On-call, see care teams to locate the  attending and reach out via [email or web address]. Between 7PM-7AM, please contact night-coverage If you still have difficulty reaching the attending provider, please page the [facility] (Director on Call) for [facility] on amion for assistance.

## 2021-07-23 NOTE — TOC Initial Note (Signed)
Transition of Care [facility]) - Initial/Assessment Note    Patient Details  Name: Dylan Pittman MRN: [id number] Date of Birth: Oct 20, 1950  [facility] of [facility]) CM/SW Contact:    Shelbie Hutching, RN Phone Number: 07/23/2021, 1:01 PM  Clinical Narrative:                 Patient admitted to the hospital with acute on chronic respiratory failure, has interstitial lung disease and tested positive for rhinovirus.  RNCM met with patient at the bedside, wife is present.  He was recently discharged from [facility], he was only home 2 days before having to call EMS and come back to the hospital.   Patient's wife reports that he was sent home from [place] on 22L of oxygen and had 2 10 liter concentrators and a tank for a non rebreather.  He did not have home health services, he was walking at home back and forth to the bathroom with his rollator.   Patient did not enjoy his stay at [facility] and definitely would not consider going back.  He would consider going to a facility that is local in [place].  RNCM did inform patient and wife that his oxygen requirements will play a factor in discharge planning and facilities may not be able to accept with high oxygen requirements.   Hopefully patient will be able to return home with home health services at discharge.   TOC will cont to follow.   Expected Discharge Plan: [place] Barriers to Discharge: Continued Medical Work up   Patient Goals and CMS Choice Patient states their goals for this hospitalization and ongoing recovery are:: Patient wants to be able to eat something and get his oxygen requirements down      Expected Discharge Plan and Services Expected Discharge Plan: [place] arrangements for the past 2 months: Single Family Home                 DME Arranged: N/A DME Agency: NA                  Prior Living Arrangements/Services Living arrangements for the past 2 months:  Single Family Home Lives with:: Spouse Patient language and need for interpreter reviewed:: Yes Do you feel safe going back to the place where you live?: Yes      Need for Family Participation in Patient Care: Yes (Comment) (interstitial lung disease) Care giver support system in place?: Yes (comment) (wife) Current home services: DME (oxygen, rollator, CPAP) Criminal Activity/Legal Involvement Pertinent to Current Situation/Hospitalization: No - Comment as needed  Activities of Daily Living      Permission Sought/Granted Permission sought to share information with : Case Manager, Family Supports Permission granted to share information with : Yes, Verbal Permission Granted  Share Information with NAME: Mazin Emma     Permission granted to share info w Relationship: spouse  Permission granted to share info w Contact Information: [phone]  Emotional Assessment Appearance:: Appears older than stated age Attitude/Demeanor/Rapport: Engaged Affect (typically observed): Accepting Orientation: : Oriented to Self, Oriented to Place, Oriented to  Time, Oriented to Situation Alcohol / Substance Use: Not Applicable Psych Involvement: No (comment)  Admission diagnosis:  Hypoxia [R09.02] Acute respiratory failure with hypoxia ([place]) [J96.01] Acute on chronic respiratory failure (HCC) [J96.20] Patient Active Problem List   Diagnosis Date Noted   Acute on chronic respiratory failure ([place]) 07/07/2021   ILD (  interstitial lung disease) ([place]) 06/06/2021   Acute on chronic respiratory failure with hypoxia (HCC)    Pneumonia due to COVID-19 virus 05/09/2021   Prostate cancer ([place]) 01/24/2021   Benign prostatic hyperplasia without lower urinary tract symptoms 07/31/2019   Elevated PSA 06/18/2018   Vitamin D deficiency 01/18/2018   Esophageal reflux 04/20/2014   Hypertension 04/20/2014   Sleep apnea 04/20/2014   Dyslipidemia 04/07/2014   Microalbuminuria 04/07/2014   Obesity,  unspecified 04/07/2014   Hyperlipidemia due to type 2 diabetes mellitus ([place]) 04/07/2014   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   Diabetes mellitus ([place]) 01/08/2014   PCP:  Sofie Hartigan, MD Pharmacy:   [facility] [address], [address] [place] [address] Phone: [phone] Fax: [phone]     Social Determinants of Health (SDOH) Interventions    Readmission Risk Interventions Readmission Risk Prevention Plan 07/23/2021  Transportation Screening Complete  Medication Review ([place]) Complete  PCP or Specialist appointment within 3-5 days of discharge Complete  HRI or [place] Complete  SW Recovery Care/Counseling Consult Complete  [place] Not Applicable  Some recent data might be hidden

## 2021-07-23 NOTE — Assessment & Plan Note (Signed)
Body mass index is 34.9 kg/m.  Placing the patient at high risk of poor outcome.

## 2021-07-23 NOTE — Assessment & Plan Note (Signed)
Continue Flomax. Also Foley catheter inserted due to critical illness.

## 2021-07-23 NOTE — Assessment & Plan Note (Signed)
Hemoglobin A1c 7.8. At risk for steroid-induced hyperglycemia. Continue sliding scale insulin and Lantus.

## 2021-07-23 NOTE — Assessment & Plan Note (Signed)
Proteinuria at baseline. Monitor.

## 2021-07-23 NOTE — Assessment & Plan Note (Signed)
Echocardiogram shows preserved EF with grade 2 diastolic dysfunction without any significant valvular abnormality. Responding to IV diuresis. Continue IV Lasix 40 twice daily. Daily BMP.

## 2021-07-23 NOTE — Assessment & Plan Note (Signed)
At baseline uses 16 L of oxygen. When EMS arrived patient was satting 51%. Currently on heated high flow 100% FiO2. Also intermittent BiPAP as needed. Continue aggressive diuresis as well. Continue pulmonary toilet.  Continue Decadron.  Nebulizers. Appreciate pulmonary consultation.  Continue to remain in stepdown unit.

## 2021-07-24 ENCOUNTER — Inpatient Hospital Stay: Payer: BC Managed Care – PPO

## 2021-07-24 DIAGNOSIS — J849 Interstitial pulmonary disease, unspecified: Secondary | ICD-10-CM | POA: Diagnosis not present

## 2021-07-24 DIAGNOSIS — Z794 Long term (current) use of insulin: Secondary | ICD-10-CM

## 2021-07-24 DIAGNOSIS — E1165 Type 2 diabetes mellitus with hyperglycemia: Secondary | ICD-10-CM | POA: Diagnosis not present

## 2021-07-24 DIAGNOSIS — J9621 Acute and chronic respiratory failure with hypoxia: Principal | ICD-10-CM

## 2021-07-24 DIAGNOSIS — I5033 Acute on chronic diastolic (congestive) heart failure: Secondary | ICD-10-CM

## 2021-07-24 LAB — CBC WITH DIFFERENTIAL/PLATELET
Abs Immature Granulocytes: 0.06 10*3/uL (ref 0.00–0.07)
Basophils Absolute: 0 10*3/uL (ref 0.0–0.1)
Basophils Relative: 0 %
Eosinophils Absolute: 0 10*3/uL (ref 0.0–0.5)
Eosinophils Relative: 0 %
HCT: 29.7 % — ABNORMAL LOW (ref 39.0–52.0)
Hemoglobin: 9.8 g/dL — ABNORMAL LOW (ref 13.0–17.0)
Immature Granulocytes: 1 %
Lymphocytes Relative: 10 %
Lymphs Abs: 0.6 10*3/uL — ABNORMAL LOW (ref 0.7–4.0)
MCH: 30.9 pg (ref 26.0–34.0)
MCHC: 33 g/dL (ref 30.0–36.0)
MCV: 93.7 fL (ref 80.0–100.0)
Monocytes Absolute: 0.4 10*3/uL (ref 0.1–1.0)
Monocytes Relative: 6 %
Neutro Abs: 5.4 10*3/uL (ref 1.7–7.7)
Neutrophils Relative %: 83 %
Platelets: 273 10*3/uL (ref 150–400)
RBC: 3.17 MIL/uL — ABNORMAL LOW (ref 4.22–5.81)
RDW: 14.6 % (ref 11.5–15.5)
WBC: 6.4 10*3/uL (ref 4.0–10.5)
nRBC: 0 % (ref 0.0–0.2)

## 2021-07-24 LAB — BASIC METABOLIC PANEL
Anion gap: 8 (ref 5–15)
BUN: 25 mg/dL — ABNORMAL HIGH (ref 8–23)
CO2: 41 mmol/L — ABNORMAL HIGH (ref 22–32)
Calcium: 8.9 mg/dL (ref 8.9–10.3)
Chloride: 91 mmol/L — ABNORMAL LOW (ref 98–111)
Creatinine, Ser: 0.47 mg/dL — ABNORMAL LOW (ref 0.61–1.24)
GFR, Estimated: >60 mL/min (ref >60)
Glucose, Bld: 230 mg/dL — ABNORMAL HIGH (ref 70–99)
Potassium: 3.9 mmol/L (ref 3.5–5.1)
Sodium: 140 mmol/L (ref 135–145)

## 2021-07-24 LAB — GLUCOSE, CAPILLARY
Glucose-Capillary: 211 mg/dL — ABNORMAL HIGH (ref 70–99)
Glucose-Capillary: 183 mg/dL — ABNORMAL HIGH (ref 70–99)
Glucose-Capillary: 241 mg/dL — ABNORMAL HIGH (ref 70–99)
Glucose-Capillary: 251 mg/dL — ABNORMAL HIGH (ref 70–99)
Glucose-Capillary: 258 mg/dL — ABNORMAL HIGH (ref 70–99)
Glucose-Capillary: 121 mg/dL — ABNORMAL HIGH (ref 70–99)

## 2021-07-24 LAB — MAGNESIUM: Magnesium: 2.6 mg/dL — ABNORMAL HIGH (ref 1.7–2.4)

## 2021-07-24 LAB — PHOSPHORUS: Phosphorus: 3.9 mg/dL (ref 2.5–4.6)

## 2021-07-24 MED ORDER — PANTOPRAZOLE SODIUM 40 MG PO TBEC
40.0000 mg | DELAYED_RELEASE_TABLET | Freq: Two times a day (BID) | ORAL | Status: DC
  Administered 2021-07-24: 21:00:00 40 mg via ORAL
  Filled 2021-07-24: qty 1

## 2021-07-24 MED ORDER — INSULIN ASPART 100 UNIT/ML IJ SOLN
7.0000 [IU] | Freq: Three times a day (TID) | INTRAMUSCULAR | Status: DC
  Administered 2021-07-24: 17:00:00 7 [IU] via SUBCUTANEOUS
  Filled 2021-07-24: qty 1

## 2021-07-24 MED ORDER — ENOXAPARIN SODIUM 60 MG/0.6ML IJ SOSY
0.5000 mg/kg | PREFILLED_SYRINGE | INTRAMUSCULAR | Status: DC
  Administered 2021-07-24: 21:00:00 52.5 mg via SUBCUTANEOUS
  Filled 2021-07-24: qty 0.53

## 2021-07-24 MED ORDER — INSULIN ASPART 100 UNIT/ML IJ SOLN
0.0000 [IU] | Freq: Three times a day (TID) | INTRAMUSCULAR | Status: DC
Start: 2021-07-24 — End: 2021-07-25
  Administered 2021-07-24: 22:00:00 2 [IU] via SUBCUTANEOUS
  Filled 2021-07-24: qty 1

## 2021-07-24 MED ORDER — INSULIN ASPART 100 UNIT/ML IJ SOLN
0.0000 [IU] | Freq: Three times a day (TID) | INTRAMUSCULAR | Status: DC
  Administered 2021-07-24: 17:00:00 8 [IU] via SUBCUTANEOUS
  Filled 2021-07-24: qty 1

## 2021-07-24 MED ORDER — INSULIN ASPART 100 UNIT/ML IJ SOLN: 0.0000 [IU] | Freq: Every day | INTRAMUSCULAR | Status: DC

## 2021-07-24 MED ORDER — INSULIN ASPART 100 UNIT/ML IJ SOLN
0.0000 [IU] | INTRAMUSCULAR | Status: DC
Start: 2021-07-24 — End: 2021-07-24
  Administered 2021-07-24 (×2): 7 [IU] via SUBCUTANEOUS
  Administered 2021-07-24: 09:00:00 4 [IU] via SUBCUTANEOUS
  Filled 2021-07-24 (×3): qty 1

## 2021-07-24 NOTE — Progress Notes (Signed)
Patient ID: Dylan Pittman, male   DOB: 1951/07/03, 70 y.o.   MRN: [id number] [facility] PROGRESS NOTE  Dylan Pittman:[id number] DOB: [birth year] DOA: 07/27/2021 PCP: Sofie Hartigan, MD  HPI/Subjective: Patient states that his leg swelling is much improved since when he came into the hospital.  Still short of breath.  Still coughing a bit.  Patient was on 100% nonrebreather plus heated high flow nasal cannula 85% FiO2 with 50 L flow.  We took off the 100% nonrebreather and saturated well until he sat up for me to listen to his lungs then his O2 saturations dropped down to 70%.  Found to have rhinovirus infection.  Admitted with acute on chronic hypoxic respiratory failure.  Objective: Vitals:   07/24/21 0821 07/24/21 1000  BP:  129/68  Pulse:    Resp:  (!) 23  Temp:    SpO2: 94%     Intake/Output Summary (Last 24 hours) at 07/24/2021 1348 Last data filed at 07/24/2021 1000 Gross per 24 hour  Intake 200 ml  Output 3400 ml  Net -3200 ml   Filed Weights   07/13/2021 1344 07/12/2021 2120  Weight: 105.7 kg 104.1 kg    ROS: Review of Systems  Respiratory:  Positive for cough and shortness of breath.   Cardiovascular:  Negative for chest pain.  Gastrointestinal:  Negative for abdominal pain, nausea and vomiting.  Exam: Physical Exam HENT:     Head: Normocephalic.     Mouth/Throat:     Pharynx: No oropharyngeal exudate.  Eyes:     General: Lids are normal.     Conjunctiva/sclera: Conjunctivae normal.  Cardiovascular:     Rate and Rhythm: Normal rate and regular rhythm.     Heart sounds: Normal heart sounds, S1 normal and S2 normal.  Pulmonary:     Breath sounds: Examination of the right-middle field reveals decreased breath sounds and wheezing. Examination of the left-middle field reveals decreased breath sounds and wheezing. Examination of the right-lower field reveals decreased breath sounds and rhonchi. Examination of the left-lower field reveals decreased breath  sounds and rhonchi. Decreased breath sounds, wheezing and rhonchi present. No rales.     Comments: Coughs with deep breath. Abdominal:     Palpations: Abdomen is soft.     Tenderness: There is no abdominal tenderness.  Musculoskeletal:     Right lower leg: Swelling present.     Left lower leg: Swelling present.  Skin:    General: Skin is warm.     Findings: No rash.  Neurological:     Mental Status: He is alert and oriented to person, place, and time.      Scheduled Meds:  Chlorhexidine Gluconate Cloth  6 each Topical Q0600   dexamethasone (DECADRON) injection  4 mg Intravenous Q12H   enoxaparin (LOVENOX) injection  0.5 mg/kg Subcutaneous Q24H   feeding supplement (GLUCERNA SHAKE)  237 mL Oral BID BM   furosemide  40 mg Intravenous BID   guaiFENesin-dextromethorphan  5 mL Oral QID   insulin aspart  0-20 Units Subcutaneous Q4H   insulin glargine-yfgn  54 Units Subcutaneous Daily   ipratropium-albuterol  3 mL Nebulization Q6H   mometasone-formoterol  2 puff Inhalation BID   mycophenolate  250 mg Oral BID   oxymetazoline  1 spray Each Nare BID   pantoprazole  40 mg Oral BID   senna-docusate  1 tablet Oral BID   simethicone  80 mg Oral QID   sodium chloride flush  3 mL Intravenous Q12H  sulfamethoxazole-trimethoprim  1 tablet Oral Once per day on Mon Wed Fri   tamsulosin  0.4 mg Oral Daily   Continuous Infusions:  sodium chloride     albumin human 25 g (07/24/21 1115)    Assessment/Plan:  Acute on chronic hypoxic respiratory failure.  Interstitial lung disease exacerbation with pulmonary fibrosis.  Patient on heated high flow nasal cannula 85% oxygen 50 L flow +100% nonrebreather.  Exacerbation secondary to positive rhinovirus infection.  Patient on CellCept to reduce steroids.  Currently on Decadron 4 mg IV twice daily antibiotics discontinued.  Desaturates with minimal movement.  Empiric Bactrim while on CellCept Acute on chronic diastolic congestive heart failure and  lower extremity edema.  Continue Lasix 40 mg IV twice daily with IV albumin.  Diuresing well. Type 2 diabetes mellitus with hyperglycemia on glargine insulin and sliding scale insulin.  We will add short acting prior to meals. Nasal congestion continue nasal spray As needed cough medication BPH on Flomax        Code Status:     Code Status Orders  (From admission, onward)           Start     Ordered   07/23/21 1813  Full code  Continuous        07/23/21 1812           Code Status History     Date Active Date Inactive Code Status Order ID Comments User Context   07/23/2021 1544 07/23/2021 1812 DNR [id number]  Collier Bullock, MD ED   05/30/2021 1851 06/17/2021 1812 Full Code [id number]  Milus Banister, NP ED   05/09/2021 0517 05/13/2021 2039 Full Code [id number]  Mansy, Arvella Merles, MD ED   02/12/2020 1403 02/12/2020 1933 Full Code [id number]  Hessie Knows, MD Inpatient      Family Communication: Spoke to patient's sister at the bedside Disposition Plan: Status is: Inpatient  Consultants: Critical care specialist  Antibiotics: Prophylactic Bactrim  Time spent: 28 minutes  [place]

## 2021-07-24 NOTE — Progress Notes (Signed)
NAME:  Dylan Pittman, MRN:  [id number], DOB:  12/13/1950, LOS: 3 ADMISSION DATE:  07/26/2021, CONSULTATION DATE:  05/30/21 REFERRING MD:  Dr. Creig Hines, CHIEF COMPLAINT:   Shortness of breath  History of Present Illness:   70 year old male presenting to [facility] ED from home on 07/02/2021 with complaints of progressive hypoxia at rest/with exertion and dyspnea with exertion.  Of note patient was recently hospitalized with COVID-19 pneumonia from 05/09/2021 to 05/13/2021 and 05/2021 for Acute exacerbation of ILD .  He was released from rehab facility only 2 days ago from previous discharge.  He shares things have been improving and he has been in usual state of health ambulatory with onset of dyspnea and cough after being home 2nd day.  He denies chest pain, does endorse onging LE edema.  He denies flu like illness.  He was unable to reach normoxia on maximal settings of supplemental O2 >6L while at home.  He requires BIPAP during my evaluation with setting of 90%.  He admits to epistaxis.  He denies chest pain, nausea/vomiting/diarrhea/abdominal pain, fever/chills, or sore throat.   07/22/21- patient had trial of HFNC but desaturated and requires back to bipap. Reviewed case with wife and patient.  07/23/21- patient is clincially imporved. RVP + for Rhinovirus.  Reviewed case with Dr Posey Pronto today. Met with wife yesterday reviewed medical plan.  07/24/21- patient improved clinically , FiO2 is at 73%/50L. CRP/ESR repeat in am . CXR repeat today. Blood work stable. Will dc albumin now due to improved PO nourishment.    Pertinent  Medical History  Pulmonary fibrosis Hypertension Hyperlipidemia Type 2 diabetes Obstructive sleep apnea COVID-19 (September 2022) Prostate cancer status post seed ablation and Lupron injections BPH GERD    Objective   Blood pressure 129/68, pulse 78, temperature 97.9 F (36.6 C), temperature source Axillary, resp. rate (!) 23, height '5\' 8"'  (1.727 m), weight 104.1 kg, SpO2  94 %.    FiO2 (%):  [85 %-100 %] 85 %   Intake/Output Summary (Last 24 hours) at 07/24/2021 1052 Last data filed at 07/24/2021 1000 Gross per 24 hour  Intake 300 ml  Output 3400 ml  Net -3100 ml    Filed Weights   07/13/2021 1344 07/23/2021 2120  Weight: 105.7 kg 104.1 kg    Examination: General: Adult male, acutely ill, lying in bed, NAD HEENT: MM pink/moist, anicteric, atraumatic, neck supple Neuro: A&O x 4, able to follow commands, PERRL +3, MAE CV: s1s2 RRR, sinus tachycardia on monitor, no r/m/g Pulm: Rmild rhonchi bilaterally  breath sounds coarse/diminished-BUL & diminished-BLL GI: soft, rounded, non tender, bs x 4 Skin: Limited exam-patient still in street clothes> no rashes/lesions noted Extremities: warm/dry, pulses + 2 R/P, 2+ edema noted BLE    Assessment & Plan:  Acute Hypoxic Respiratory Failure       Post COVID ILD with pulmonary fibrosis and currently acute exacerbation of ILD  - may switch to HFNC  O2 to maintain SpO2 > 88% -RVP -rhinoevirus viral induced AEILD -ABG-improved - Intermittent chest x-ray & ABG PRN - Ensure adequate pulmonary hygiene: IS Q 1 h x 10 while awake - steroids to dexamethasone 6 bid  now down to 4 bid -weaning protocol -cellcept 250 bid PO - dcd home Vitamin C & Zinc -increased protonix to bid -Dc antibiotics today - DC VANCO dc Cefepime-keep bactrim TIW for PCP ppx   Type 2 Diabetes Mellitus At Risk for Steroid Induced Hyperglycemia - Monitor CBG AC, HS - SSI moderate dosing, continue  home long-acting coverage (solostar 40 units daily) >> Lantus 20 units BID - target range while in ICU: 140-180 - follow ICU hyper/hypo-glycemia protocol - Carb-modified diet in place -contiue dexamethasone at current dose   BPH - continue home flomax - strict I & O's, monitor for urinary retention  Hyperlipidemia  Hypertension - continue home rosuvastatin - due to SIRS response to suspected Pneumonia with marginal-normal BP, will hold  outpatient anti-hypertensives. Consider restarting: Losartan as patient stabilizes - PRN hydralazine if SBP > 160  Best Practice (right click and "Reselect all SmartList Selections" daily)  Diet/type: Regular consistency (see orders) DVT prophylaxis: LMWH GI prophylaxis: PPI Lines: N/A Foley:  N/A Code Status:  full code Last date of multidisciplinary goals of care discussion [05/30/2021]  IMAGING     Recent Labs  Lab 07/09/2021 1408 07/22/21 0537 07/23/21 0541 07/24/21 0403  WBC 10.4 7.5 8.5 6.4  NEUTROABS  --   --   --  5.4  HGB 10.2* 9.7* 9.5* 9.8*  HCT 31.5* 29.2* 29.2* 29.7*  MCV 94.6 94.8 94.5 93.7  PLT 230 214 236 273     Basic Metabolic Panel: Recent Labs  Lab 06/30/2021 1340 07/22/21 0537 07/23/21 0541 07/24/21 0403  NA 140 140 143 140  K 3.6 3.8 3.3* 3.9  CL 98 98 96* 91*  CO2 36* 36* 40* 41*  GLUCOSE 160* 199* 113* 230*  BUN 21 22 24* 25*  CREATININE 0.69 0.53* 0.52* 0.47*  CALCIUM 8.6* 8.4* 9.0 8.9  MG  --   --  2.3 2.6*  PHOS  --   --  3.9 3.9    GFR: Estimated Creatinine Clearance: 103.4 mL/min (A) (by C-G formula based on SCr of 0.47 mg/dL (L)). Recent Labs  Lab 07/14/2021 1340 07/17/2021 1408 07/02/2021 1829 07/22/21 0537 07/23/21 0541 07/24/21 0403  PROCALCITON <0.10  --   --   --   --   --   WBC  --  10.4  --  7.5 8.5 6.4  LATICACIDVEN 2.5*  --  1.1  --   --   --      Liver Function Tests: Recent Labs  Lab 07/07/2021 1340  AST 31  ALT 30  ALKPHOS 94  BILITOT 0.7  PROT 7.0  ALBUMIN 3.2*    No results for input(s): LIPASE, AMYLASE in the last 168 hours. No results for input(s): AMMONIA in the last 168 hours.  ABG    Component Value Date/Time   PHART 7.41 07/16/2021 1606   PCO2ART 62 (H) 07/01/2021 1606   PO2ART 142 (H) 07/08/2021 1606   HCO3 39.3 (H) 07/10/2021 1606   O2SAT 99.2 06/28/2021 1606     Coagulation Profile: No results for input(s): INR, PROTIME in the last 168 hours.  Cardiac Enzymes: No results for  input(s): CKTOTAL, CKMB, CKMBINDEX, TROPONINI in the last 168 hours.  HbA1C: Hgb A1c MFr Bld  Date/Time Value Ref Range Status  07/22/2021 05:37 AM 7.8 (H) 4.8 - 5.6 % Final    Comment:    (NOTE)         Prediabetes: 5.7 - 6.4         Diabetes: >6.4         Glycemic control for adults with diabetes: <7.0   05/09/2021 06:18 AM 8.6 (H) 4.8 - 5.6 % Final    Comment:    (NOTE) Pre diabetes:          5.7%-6.4%  Diabetes:              >  6.4%  Glycemic control for   <7.0% adults with diabetes     CBG: Recent Labs  Lab 07/23/21 1606 07/23/21 1943 07/23/21 2333 07/24/21 0338 07/24/21 0814  GLUCAP 271* 278* 220* 211* 183*     Review of Systems: Positives in BOLD   Gen: Denies fever, chills, weight change, fatigue, night sweats HEENT: Denies blurred vision, double vision, hearing loss, tinnitus, sinus congestion, rhinorrhea, sore throat, neck stiffness, dysphagia PULM: Denies shortness of breath, cough, sputum production, hemoptysis, wheezing CV: Denies chest pain, edema, orthopnea, paroxysmal nocturnal dyspnea, palpitations GI: Denies abdominal pain, nausea, vomiting, diarrhea, hematochezia, melena, constipation, change in bowel habits GU: Denies dysuria, hematuria, polyuria, oliguria, urethral discharge Endocrine: Denies hot or cold intolerance, polyuria, polyphagia or appetite change Derm: Denies rash, dry skin, scaling or peeling skin change Heme: Denies easy bruising, bleeding, bleeding gums Neuro: Denies headache, numbness, weakness, slurred speech, loss of memory or consciousness  Past Medical History:  He,  has a past medical history of Chronic airway obstruction (HCC), DDD (degenerative disc disease), lumbar, Dupuytren's disease, Dyslipidemia (04/07/2014), Esophageal reflux (04/20/2014), Hypertension (04/20/2014), Microalbuminuria (04/07/2014), Microalbuminuria, Obesity, unspecified (04/07/2014), Sleep apnea (04/20/2014), and 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   Surgical History:   Past Surgical History:  Procedure Laterality Date   BACK SURGERY  1992, 2005   CLOSED REDUCTION FINGER WITH PERCUTANEOUS PINNING Left 02/12/2020   Procedure: Closed reduction and pinning of left first metacarpal fracture with possible open reduction;  Surgeon: Hessie Knows, MD;  Location: [facility] ORS;  Service: Orthopedics;  Laterality: Left;   COLONOSCOPY WITH PROPOFOL N/A 12/28/2017   Procedure: COLONOSCOPY WITH PROPOFOL;  Surgeon: Manya Silvas, MD;  Location: [facility] ENDOSCOPY;  Service: Endoscopy;  Laterality: N/A;   NASAL SEPTUM SURGERY     RADIOACTIVE SEED IMPLANT N/A 04/12/2021   Procedure: RADIOACTIVE SEED IMPLANT/BRACHYTHERAPY IMPLANT;  Surgeon: Abbie Sons, MD;  Location: [facility] ORS;  Service: Urology;  Laterality: N/A;  73 seeds implanted   VASECTOMY  2005     Social History:   reports that he has quit smoking. He has never used smokeless tobacco. He reports that he does not currently use alcohol. He reports that he does not currently use drugs.   Family History:  His family history is negative for Prostate cancer and Kidney cancer.   Allergies No Known Allergies   Home Medications  Prior to Admission medications   Medication Sig Start Date End Date Taking? Authorizing Provider  albuterol (VENTOLIN HFA) 108 (90 Base) MCG/ACT inhaler Inhale 2 puffs into the lungs every 6 (six) hours as needed for wheezing or shortness of breath. 01/20/20  Yes [provider]  ascorbic acid (VITAMIN C) 1000 MG tablet Take 0.5 tablets (500 mg total) by mouth daily. 05/14/21  Yes Wieting, Richard, MD  cholecalciferol (VITAMIN D) 25 MCG (1000 UNIT) tablet Take 1,000 Units by mouth in the morning.   Yes [provider]  fluticasone-salmeterol (ADVAIR HFA) 115-21 MCG/ACT inhaler Inhale 2 puffs into the lungs 2 (two) times daily.   Yes [provider]  furosemide (LASIX) 20 MG tablet Take 20 mg by mouth daily. 05/26/21  Yes  [provider]  gabapentin (NEURONTIN) 300 MG capsule Take 300 mg by mouth 3 (three) times daily. 05/26/21  Yes [provider]  insulin aspart (NOVOLOG) 100 UNIT/ML FlexPen Inject 8 Units into the skin 3 (three) times daily with meals. Okay to substitute generic 05/13/21  Yes Wieting, Richard, MD  losartan (COZAAR) 50 MG tablet  Take 50 mg by mouth in the morning. 10/07/17  Yes [provider]  OZEMPIC, 1 MG/DOSE, 4 MG/3ML SOPN Inject 1 mg into the skin every Sunday. 07/29/19  Yes [provider]  pantoprazole (PROTONIX) 40 MG tablet Take 40 mg by mouth 2 (two) times daily. 10/19/17  Yes [provider]  pioglitazone (ACTOS) 30 MG tablet Take 30 mg by mouth in the morning. 07/27/19  Yes [provider]  rosuvastatin (CRESTOR) 5 MG tablet Take 5 mg by mouth every Monday, Wednesday, and Friday. In the morning 04/19/18  Yes [provider]  tamsulosin (FLOMAX) 0.4 MG CAPS capsule Take 1 capsule (0.4 mg total) by mouth daily. 03/25/21  Yes Vaillancourt, Samantha, PA-C  TOUJEO MAX SOLOSTAR 300 UNIT/ML Solostar Pen Inject 50 Units into the skin in the morning. Patient taking differently: Inject 40 Units into the skin in the morning. 05/13/21  Yes Wieting, Richard, MD  XIGDUO XR 12-998 MG TB24 Take 2 tablets by mouth every morning. 01/31/21  Yes [provider]  zinc sulfate 220 (50 Zn) MG capsule Take 1 capsule (220 mg total) by mouth daily. 05/14/21  Yes Wieting, Richard, MD  aspirin EC 81 MG tablet Take 81 mg by mouth in the morning. Swallow whole. Patient not taking: Reported on 05/30/2021    [provider]  chlorpheniramine-HYDROcodone (TUSSIONEX) 10-8 MG/5ML SUER Take 5 mLs by mouth every 12 (twelve) hours as needed for cough. Patient not taking: No sig reported 05/13/21   Loletha Grayer, MD  feeding supplement, GLUCERNA SHAKE, (GLUCERNA SHAKE) LIQD Take 237 mLs by mouth 2 (two) times daily between meals. 05/13/21   Loletha Grayer, MD  fluticasone-salmeterol (ADVAIR HFA) 681-59 MCG/ACT inhaler Inhale 2 puffs into the lungs 2 (two) times daily. Rinse mouth out with water after use Patient not taking: No sig reported 05/13/21   Loletha Grayer, MD  Insulin Pen Needle 33G X 4 MM MISC 1 Dose by Does not apply route 3 (three) times daily before meals. 05/13/21   Loletha Grayer, MD  predniSONE (DELTASONE) 10 MG tablet Three tabs po daily for five days Patient not taking: No sig reported 05/13/21   Loletha Grayer, MD     Critical care provider statement:   Total critical care time: 33 minutes   Performed by: Lanney Gins MD   Critical care time was exclusive of separately billable procedures and treating other patients.   Critical care was necessary to treat or prevent imminent or life-threatening deterioration.   Critical care was time spent personally by me on the following activities: development of treatment plan with patient and/or surrogate as well as nursing, discussions with consultants, evaluation of patient's response to treatment, examination of patient, obtaining history from patient or surrogate, ordering and performing treatments and interventions, ordering and review of laboratory studies, ordering and review of radiographic studies, pulse oximetry and re-evaluation of patient's condition.    Ottie Glazier, M.D.  Pulmonary & Critical Care Medicine

## 2021-07-24 NOTE — Progress Notes (Signed)
PHARMACIST - PHYSICIAN COMMUNICATION  CONCERNING: IV to Oral Route Change Policy  RECOMMENDATION: This patient is receiving pantoprazole by the intravenous route.  Based on criteria approved by the Pharmacy and Therapeutics Committee, the intravenous medication(s) is/are being converted to the equivalent oral dose form(s).   DESCRIPTION: These criteria include: The patient is eating (either orally or via tube) and/or has been taking other orally administered medications for a least 24 hours The patient has no evidence of active gastrointestinal bleeding or impaired GI absorption (gastrectomy, short bowel, patient on TNA or NPO).  If you have questions about this conversion, please contact the [place] 07/24/2021 12:54 PM

## 2021-07-24 NOTE — Progress Notes (Signed)
PHARMACIST - PHYSICIAN COMMUNICATION  CONCERNING:  Enoxaparin (Lovenox) for DVT Prophylaxis    RECOMMENDATION: Patient was prescribed enoxaparin 40mg  q24 hours for VTE prophylaxis.   Filed Weights   07/07/2021 1344 07/26/2021 2120  Weight: 105.7 kg (233 lb) 104.1 kg (229 lb 8 oz)    Body mass index is 34.9 kg/m.  Estimated Creatinine Clearance: 103.4 mL/min (A) (by C-G formula based on SCr of 0.47 mg/dL (L)).   Based on [place] patient is candidate for enoxaparin 0.5mg /kg TBW SQ every 24 hours based on BMI being >30.  DESCRIPTION: Pharmacy has adjusted enoxaparin dose per [facility] policy.  Patient is now receiving enoxaparin 52.5 mg every 24 hours   Benita Gutter 07/24/2021 12:52 PM

## 2021-07-25 DIAGNOSIS — I469 Cardiac arrest, cause unspecified: Secondary | ICD-10-CM

## 2021-07-25 LAB — FUNGITELL, SERUM: Fungitell Result: 38 pg/mL (ref <80)

## 2021-07-25 MED ORDER — EPINEPHRINE 1 MG/10ML IJ SOSY
PREFILLED_SYRINGE | INTRAMUSCULAR | Status: AC
  Filled 2021-07-25: qty 10

## 2021-07-25 MED ORDER — EPINEPHRINE PF 1 MG/ML IJ SOLN
INTRAMUSCULAR | Status: AC
  Filled 2021-07-25: qty 1

## 2021-07-25 MED ORDER — EPINEPHRINE 1 MG/10ML IJ SOSY
PREFILLED_SYRINGE | INTRAMUSCULAR | Status: AC
  Filled 2021-07-25: qty 20

## 2021-07-26 LAB — CULTURE, BLOOD (SINGLE)
Culture: NO GROWTH
Culture: NO GROWTH
Special Requests: ADEQUATE
Special Requests: ADEQUATE

## 2021-07-28 NOTE — Progress Notes (Addendum)
08-15-21 approximately 1200. Since patient was found on floor prior to death, contacted [employer] [place]. He stated he had talked with [facility] in [place]. He stated they did not accept it as a case.   Wife had called earlier, asking if he fell because the side rail was down. She stated she had visited earlier and thinks she may have left the side rail down. She stated family members were trying to figure out "who was to blame and they wanted to point fingers at someone.". Sharlee Blew notified of inquiry and requested to contact patient's wife

## 2021-07-28 NOTE — Death Summary Note (Signed)
DEATH SUMMARY   Patient Details  Name: Dylan Pittman MRN: [id number] DOB: 1950-09-30  Admission/Discharge Information   Admit Date:  2021/08/20  Date of Death: Date of Death: 08/24/21  Time of Death: Time of Death: 0218  Length of Stay: 4  Referring Physician: Sofie Hartigan, MD   Reason(s) for Hospitalization  Acute on chronic hypoxic respiratory failure, interstitial lung disease exacerbation, rhinovirus infection  Diagnoses  Preliminary cause of death:  Secondary Diagnoses (including complications and co-morbidities):  Principal Problem:   Acute on chronic respiratory failure with hypoxia ([place]) Active Problems:   Hypertension   Obesity, unspecified   Sleep apnea   Type 2 diabetes mellitus with hyperglycemia, with long-term current use of insulin (HCC)   Benign prostatic hyperplasia without lower urinary tract symptoms   ILD (interstitial lung disease) (HCC)   CKD (chronic kidney disease) stage 1, 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 year old male with past medical history of interstitial lung disease on chronic oxygen up to 16 L, COVID infection back in September 2022, acute on chronic diastolic congestive heart failure, type 2 diabetes mellitus with hyperglycemia.  The patient was admitted to the hospital on 08-20-21 with shortness of breath.  He was admitted with acute on chronic hypoxic respiratory failure and started on BiPAP.  Patient was started on steroids.  Pulmonary consultation was obtained.  Initially started on empiric antibiotics.  The patient was switched over to heated high flow nasal cannula 85% oxygen 50 L flow and  also needed 100% nonrebreather on top of that.  Pulmonary started empiric CellCept to limit the amount of steroids.  Patient was diuresed with IV Lasix.  The patient with minimal movement desaturated quickly into the 70s, despite being on heated high flow nasal cannula 85% oxygen +100% nonrebreather.  On 2021/08/24, the patient was discovered on the floor next to his bed with his oxygen off.  He was unresponsive but did have a pulse at that time.  He was placed on BiPAP.  Then the pulse was lost and then CPR was started.  CPR was performed for 44 minutes and patient was pronounced at 2:18 AM.   Pertinent Labs and Studies  Significant Diagnostic Studies CT Angio Chest PE W and/or Wo Contrast  Result Date: Aug 20, 2021 CLINICAL DATA:  PE suspected. Progressive hypoxia. Patient recently diagnosed with COVID-19 pneumonia. EXAM: CT ANGIOGRAPHY CHEST WITH CONTRAST TECHNIQUE: Multidetector CT imaging of the chest was performed using the standard protocol during bolus administration of intravenous contrast. Multiplanar CT image reconstructions and MIPs were obtained to evaluate the vascular anatomy. CONTRAST:  19mL OMNIPAQUE IOHEXOL 350 MG/ML SOLN COMPARISON:  Chest x-ray from earlier today. CT scan of the chest May 30, 2021 FINDINGS: Cardiovascular: The thoracic aorta is normal. Three-vessel coronary artery disease identified. The heart is unchanged. No pulmonary emboli identified. Mediastinum/Nodes: The thyroid and esophagus are normal. No pericardial effusion. A tiny left pleural effusion is identified. The chest wall is normal. Adenopathy remains in the mediastinum and hila, unchanged. Lungs/Pleura: Central airways are normal. No pneumothorax. The patient has known interstitial lung disease. Patient has patchy relatively diffuse ground-glass opacities. Some regions are improved compared to  May 30, 2021 but others are worsened. The findings are concerning for a superimposed recurrent infectious process.  Upper Abdomen: No acute abnormality. Musculoskeletal: No chest wall abnormality. No acute or significant osseous findings. Review of the MIP images confirms the above findings. IMPRESSION: 1. No pulmonary emboli identified. 2. Interstitial lung disease. Diffuse patchy ground-glass opacities are worrisome for a recurrent multifocal infectious process. Electronically Signed   By: Dorise Bullion III M.D.   On: 07/19/2021 17:06   DG Chest Port 1 View  Result Date: 07/24/2021 CLINICAL DATA:  Worsening shortness of breath with cough for 4 days. History of hypertension and interstitial lung disease/pulmonary fibrosis. On home oxygen. EXAM: PORTABLE CHEST 1 VIEW COMPARISON:  Radiographs 07/17/2021 and 06/16/2021. CT 07/12/2021, 05/30/2021 and 10/20/2016. FINDINGS: 1531 hours. The heart size and mediastinal contours are stable. There are persistent low lung volumes. Extensive interstitial and ground-glass opacities are again demonstrated in both lungs, unchanged from the most recent prior studies. These have progressed compared with prior studies done 2 months ago and remains suspicious for acute superimposed process. No consolidation, pneumothorax or pleural effusion. IMPRESSION: 1. No change from recent prior studies. 2. Underlying chronic interstitial lung disease/fibrosis with recently increased interstitial and ground-glass opacities compatible with superimposed inflammation or edema. Electronically Signed   By: Richardean Sale M.D.   On: 07/24/2021 16:00   DG Chest Portable 1 View  Result Date: 07/24/2021 CLINICAL DATA:  Shortness of breath, diagnosed previously with pneumonia and bronchitis superimposed on pulmonary fibrosis by report. EXAM: PORTABLE CHEST 1 VIEW COMPARISON:  June 16, 2021. FINDINGS: EKG leads project over the chest. Study limited by body habitus and portable technique. Stable cardiomediastinal contours and hilar structures, obscured by increasing nurse tissue old in alveolar opacities  in the LEFT and RIGHT chest when compared to the previous study. No signs of lobar consolidative process or evidence of gross pleural effusion or pneumothorax. On limited assessment there is no acute skeletal process. IMPRESSION: Increasing interstitial and alveolar opacities, suspicious for superimposed pneumonia or edema on background interstitial lung disease. Electronically Signed   By: Zetta Bills M.D.   On: 07/09/2021 14:08   ECHOCARDIOGRAM COMPLETE  Result Date: 07/22/2021    ECHOCARDIOGRAM REPORT   Patient Name:   KATHAN KIRKER Mendell Date of Exam: 07/22/2021 Medical Rec #:  [id number]      Height:       68.0 in Accession #:    [id number]     Weight:       229.5 lb Date of Birth:  [birth year]      BSA:          2.167 m Patient Age:    64 years       BP:           117/72 mmHg Patient Gender: M              HR:           74 bpm. Exam Location:  [facility] Procedure: 2D Echo, Color Doppler, Cardiac Doppler and Intracardiac            Opacification Agent Indications:     I50.21 congestive heart failure-Acute Systolic  History:         Patient has prior history of Echocardiogram examinations, most                  recent 06/04/2021. Interstitial lung disease; Risk  Factors:Hypertension, Diabetes, Dyslipidemia and Sleep Apnea.  Sonographer:     Charmayne Sheer Referring Phys:  [id number] Ottie Glazier Diagnosing Phys: Ida Rogue MD  Sonographer Comments: Suboptimal apical window and no subcostal window. IMPRESSIONS  1. Left ventricular ejection fraction, by estimation, is 60 to 65%. The left ventricle has normal function. The left ventricle has no regional wall motion abnormalities. Left ventricular diastolic parameters are consistent with Grade II diastolic dysfunction (pseudonormalization).  2. Right ventricular systolic function is normal. The right ventricular size is normal. Tricuspid regurgitation signal is inadequate for assessing PA pressure.  3. The mitral valve is normal in structure. Mild to  moderate mitral valve regurgitation. No evidence of mitral stenosis.  4. The aortic valve is normal in structure. Aortic valve regurgitation is not visualized. Aortic valve sclerosis/calcification is present, without any evidence of aortic stenosis.  5. The inferior vena cava is dilated in size with >50% respiratory variability, suggesting right atrial pressure of 8 mmHg. FINDINGS  Left Ventricle: Left ventricular ejection fraction, by estimation, is 60 to 65%. The left ventricle has normal function. The left ventricle has no regional wall motion abnormalities. Definity contrast agent was given IV to delineate the left ventricular  endocardial borders. The left ventricular internal cavity size was normal in size. There is no left ventricular hypertrophy. Left ventricular diastolic parameters are consistent with Grade II diastolic dysfunction (pseudonormalization). Right Ventricle: The right ventricular size is normal. No increase in right ventricular wall thickness. Right ventricular systolic function is normal. Tricuspid regurgitation signal is inadequate for assessing PA pressure. Left Atrium: Left atrial size was normal in size. Right Atrium: Right atrial size was normal in size. Pericardium: There is no evidence of pericardial effusion. Mitral Valve: The mitral valve is normal in structure. Mild to moderate mitral valve regurgitation. No evidence of mitral valve stenosis. MV peak gradient, 6.1 mmHg. The mean mitral valve gradient is 3.0 mmHg. Tricuspid Valve: The tricuspid valve is normal in structure. Tricuspid valve regurgitation is mild . No evidence of tricuspid stenosis. Aortic Valve: The aortic valve is normal in structure. Aortic valve regurgitation is not visualized. Aortic valve sclerosis/calcification is present, without any evidence of aortic stenosis. Aortic valve mean gradient measures 6.0 mmHg. Aortic valve peak  gradient measures 10.6 mmHg. Aortic valve area, by VTI measures 2.65 cm. Pulmonic  Valve: The pulmonic valve was normal in structure. Pulmonic valve regurgitation is not visualized. No evidence of pulmonic stenosis. Aorta: The aortic root is normal in size and structure. Venous: The inferior vena cava is dilated in size with greater than 50% respiratory variability, suggesting right atrial pressure of 8 mmHg. IAS/Shunts: No atrial level shunt detected by color flow Doppler.  LEFT VENTRICLE PLAX 2D LVIDd:         5.10 cm   Diastology LVIDs:         3.00 cm   LV e' medial:    9.46 cm/s LV PW:         0.90 cm   LV E/e' medial:  11.6 LV IVS:        0.80 cm   LV e' lateral:   10.90 cm/s LVOT diam:     2.30 cm   LV E/e' lateral: 10.1 LV SV:         82 LV SV Index:   38 LVOT Area:     4.15 cm  RIGHT VENTRICLE RV Basal diam:  3.30 cm LEFT ATRIUM  Index        RIGHT ATRIUM          Index LA diam:        4.00 cm 1.85 cm/m   RA Area:     9.29 cm LA Vol (A2C):   60.9 ml 28.11 ml/m  RA Volume:   18.10 ml 8.35 ml/m LA Vol (A4C):   60.7 ml 28.01 ml/m LA Biplane Vol: 61.6 ml 28.43 ml/m  AORTIC VALVE                     PULMONIC VALVE AV Area (Vmax):    2.45 cm      PV Vmax:       1.12 m/s AV Area (Vmean):   2.55 cm      PV Vmean:      82.100 cm/s AV Area (VTI):     2.65 cm      PV VTI:        0.226 m AV Vmax:           163.00 cm/s   PV Peak grad:  5.0 mmHg AV Vmean:          108.000 cm/s  PV Mean grad:  3.0 mmHg AV VTI:            0.310 m AV Peak Grad:      10.6 mmHg AV Mean Grad:      6.0 mmHg LVOT Vmax:         96.00 cm/s LVOT Vmean:        66.300 cm/s LVOT VTI:          0.198 m LVOT/AV VTI ratio: 0.64  AORTA Ao Root diam: 3.40 cm MITRAL VALVE MV Area (PHT): 4.24 cm     SHUNTS MV Area VTI:   3.07 cm     Systemic VTI:  0.20 m MV Peak grad:  6.1 mmHg     Systemic Diam: 2.30 cm MV Mean grad:  3.0 mmHg MV Vmax:       1.23 m/s MV Vmean:      88.1 cm/s MV Decel Time: 179 msec MV E velocity: 110.00 cm/s MV A velocity: 81.00 cm/s MV E/A ratio:  1.36 Ida Rogue MD Electronically signed by  Ida Rogue MD Signature Date/Time: 07/22/2021/2:11:52 PM    Final     Microbiology Recent Results (from the past 240 hour(s))  Resp Panel by RT-PCR (Flu A&B, Covid) Nasopharyngeal Swab     Status: None   Collection Time: 07/13/2021  1:40 PM   Specimen: Nasopharyngeal Swab; Nasopharyngeal(NP) swabs in vial transport medium  Result Value Ref Range Status   SARS Coronavirus 2 by RT PCR NEGATIVE NEGATIVE Final    Comment: (NOTE) SARS-CoV-2 target nucleic acids are NOT DETECTED.  The SARS-CoV-2 RNA is generally detectable in upper respiratory specimens during the acute phase of infection. The lowest concentration of SARS-CoV-2 viral copies this assay can detect is 138 copies/mL. A negative result does not preclude SARS-Cov-2 infection and should not be used as the sole basis for treatment or other patient management decisions. A negative result may occur with  improper specimen collection/handling, submission of specimen other than nasopharyngeal swab, presence of viral mutation(s) within the areas targeted by this assay, and inadequate number of viral copies(<138 copies/mL). A negative result must be combined with clinical observations, patient history, and epidemiological information. The expected result is Negative.  Fact Sheet for Patients:  EntrepreneurPulse.com.au  Fact Sheet for Healthcare Providers:  IncredibleEmployment.be  This test is no t yet approved or cleared by the [place] and  has been authorized for detection and/or diagnosis of SARS-CoV-2 by FDA under an Emergency Use Authorization (EUA). This EUA will remain  in effect (meaning this test can be used) for the duration of the COVID-19 declaration under Section 564(b)(1) of the Act, 21 U.S.C.section 360bbb-3(b)(1), unless the authorization is terminated  or revoked sooner.       Influenza A by PCR NEGATIVE NEGATIVE Final   Influenza B by PCR NEGATIVE NEGATIVE Final     Comment: (NOTE) The Xpert Xpress SARS-CoV-2/FLU/RSV plus assay is intended as an aid in the diagnosis of influenza from Nasopharyngeal swab specimens and should not be used as a sole basis for treatment. Nasal washings and aspirates are unacceptable for Xpert Xpress SARS-CoV-2/FLU/RSV testing.  Fact Sheet for Patients: EntrepreneurPulse.com.au  Fact Sheet for Healthcare Providers: IncredibleEmployment.be  This test is not yet approved or cleared by the [place] FDA and has been authorized for detection and/or diagnosis of SARS-CoV-2 by FDA under an Emergency Use Authorization (EUA). This EUA will remain in effect (meaning this test can be used) for the duration of the COVID-19 declaration under Section 564(b)(1) of the Act, 21 U.S.C. section 360bbb-3(b)(1), unless the authorization is terminated or revoked.  Performed at [facility], [address] [place], [place] [address]   Blood culture (single)     Status: None (Preliminary result)   Collection Time: 06/29/2021  1:40 PM   Specimen: BLOOD  Result Value Ref Range Status   Specimen Description BLOOD RIGHT ANTECUBITAL  Final   Special Requests   Final    BOTTLES DRAWN AEROBIC AND ANAEROBIC Blood Culture adequate volume   Culture   Final    NO GROWTH 4 DAYS Performed at [facility], [address] [place], [place] [address]    Report Status PENDING  Incomplete  Blood culture (routine single)     Status: None (Preliminary result)   Collection Time: 07/15/2021  1:40 PM   Specimen: BLOOD  Result Value Ref Range Status   Specimen Description BLOOD LEFT ANTECUBITAL  Final   Special Requests   Final    BOTTLES DRAWN AEROBIC AND ANAEROBIC Blood Culture adequate volume   Culture   Final    NO GROWTH 4 DAYS Performed at [facility], [address], [place], [place] [address]    Report Status PENDING  Incomplete  Respiratory (~20 pathogens) panel by PCR      Status: Abnormal   Collection Time: 06/30/2021  1:40 PM   Specimen: Nasopharyngeal Swab; Respiratory  Result Value Ref Range Status   Adenovirus NOT DETECTED NOT DETECTED Final   Coronavirus 229E NOT DETECTED NOT DETECTED Final    Comment: (NOTE) The Coronavirus on the Respiratory Panel, DOES NOT test for the novel  Coronavirus (2019 nCoV)    Coronavirus HKU1 NOT DETECTED NOT DETECTED Final   Coronavirus NL63 NOT DETECTED NOT DETECTED Final   Coronavirus OC43 NOT DETECTED NOT DETECTED Final   Metapneumovirus NOT DETECTED NOT DETECTED Final   Rhinovirus / Enterovirus DETECTED (A) NOT DETECTED Final   Influenza A NOT DETECTED NOT DETECTED Final   Influenza B NOT DETECTED NOT DETECTED Final   Parainfluenza Virus 1 NOT DETECTED NOT DETECTED Final   Parainfluenza Virus 2 NOT DETECTED NOT DETECTED Final   Parainfluenza Virus 3 NOT DETECTED NOT DETECTED Final   Parainfluenza Virus 4 NOT DETECTED NOT DETECTED Final   Respiratory Syncytial Virus  NOT DETECTED NOT DETECTED Final   Bordetella pertussis NOT DETECTED NOT DETECTED Final   Bordetella Parapertussis NOT DETECTED NOT DETECTED Final   Chlamydophila pneumoniae NOT DETECTED NOT DETECTED Final   Mycoplasma pneumoniae NOT DETECTED NOT DETECTED Final    Comment: Performed at [facility], [place] [address], [place], [place] [address]  MRSA Next Gen by PCR, Nasal     Status: None   Collection Time: 07/05/2021  9:29 PM   Specimen: Nasal Mucosa; Nasal Swab  Result Value Ref Range Status   MRSA by PCR Next Gen NOT DETECTED NOT DETECTED Final    Comment: (NOTE) The GeneXpert MRSA Assay (FDA approved for NASAL specimens only), is one component of a comprehensive MRSA colonization surveillance program. It is not intended to diagnose MRSA infection nor to guide or monitor treatment for MRSA infections. Test performance is not FDA approved in patients less than 21 years old. Performed at [facility], [address]  [place], [place] [address]     Lab Basic Metabolic Panel: Recent Labs  Lab 07/08/2021 1340 07/22/21 0537 07/23/21 0541 07/24/21 0403  NA 140 140 143 140  K 3.6 3.8 3.3* 3.9  CL 98 98 96* 91*  CO2 36* 36* 40* 41*  GLUCOSE 160* 199* 113* 230*  BUN 21 22 24* 25*  CREATININE 0.69 0.53* 0.52* 0.47*  CALCIUM 8.6* 8.4* 9.0 8.9  MG  --   --  2.3 2.6*  PHOS  --   --  3.9 3.9   Liver Function Tests: Recent Labs  Lab 07/09/2021 1340  AST 31  ALT 30  ALKPHOS 94  BILITOT 0.7  PROT 7.0  ALBUMIN 3.2*   No results for input(s): LIPASE, AMYLASE in the last 168 hours. No results for input(s): AMMONIA in the last 168 hours. CBC: Recent Labs  Lab 07/23/2021 1408 07/22/21 0537 07/23/21 0541 07/24/21 0403  WBC 10.4 7.5 8.5 6.4  NEUTROABS  --   --   --  5.4  HGB 10.2* 9.7* 9.5* 9.8*  HCT 31.5* 29.2* 29.2* 29.7*  MCV 94.6 94.8 94.5 93.7  PLT 230 214 236 273   Cardiac Enzymes: No results for input(s): CKTOTAL, CKMB, CKMBINDEX, TROPONINI in the last 168 hours. Sepsis Labs: Recent Labs  Lab 06/29/2021 1340 07/05/2021 1408 07/18/2021 1829 07/22/21 0537 07/23/21 0541 07/24/21 0403  PROCALCITON <0.10  --   --   --   --   --   WBC  --  10.4  --  7.5 8.5 6.4  LATICACIDVEN 2.5*  --  1.1  --   --   --       Loletha Grayer 08/01/21, 4:07 PM

## 2021-07-28 NOTE — Progress Notes (Signed)
CARDIOPULMONARY RESUSCITATION Patient was discovered on the floor next to his bed covered in stool with his gown & oxygen off, unresponsive but with a palpable pulse. BIPAP mask placed at 100% FiO2. Re-connected to the monitor, HR in the 30's and 1 mg of atropine given. Attempted to lift the patient back into bed, but unsuccessful. Patient's HR dropped into asystole and CODE resuscitation started with immediate CPR.  Initial rhythm: asystole CPR performance duration: 44 min Was defibrillation or cardioversion used ? Yes, x 1 200 J Was patient intubated pre/post CPR ? Yes, during CPR  Medications Administered Include:      Yes/no Amiodarone   Atropine 1 mg  Calcium 1  Epinephrine 14   Lidocaine   Magnesium   Norepinephrine yes  Phenylephrine   Sodium bicarbonate 2 amps  Vasopression    Evaluation Final Status - Despite 44 minutes of CPR, ACLS meds as above, mechanical intubation & defibrillation x 1. Patient expired at 02:18. Dr. Damita Dunnings notified of passing. Emotional support provided to wife on arrival, all questions answered.   Additional CC time 32 mins    Venetia Night, AGACNP-BC Acute Care Nurse Practitioner [facility]   [phone] / [phone] Please see Amion for pager details.

## 2021-07-28 NOTE — Progress Notes (Signed)
Chaplain Maggie visited with family after patient's death. Wife and son at bedside. Listening and bereavement support offered.

## 2021-07-28 NOTE — Progress Notes (Addendum)
While assisting another nurse admit a patient in ICU-4, I noticed on the monitor ICU-1 monitor was going off. When I arrived to the patient's room, I observed him sitting up on the floor propped up on the side of the bed with his oxygen off, O2 monitor off & leads off. When I entered into the room, the patient was unresponsive with a pulse. His HFNC & NRB was placed back on him and he was placed back on the monitor. Five nurses attempted to pick him up off the floor without success at which time I noticed the patient had defecated.  The patient was placed flat on the floor while RT & Britton-Lee, NP attempted to place an airway. Patient HR started to brady down at which time atropine was administered and two minutes later Epi was administered. Pads placed on patient. Patient's HR went down to 20's and lost a pulse, thus chest compressions were started and code blue called. During this time, I contacted the patient's wife to notify her of the patient's current condition. Wife stated to continue compressions while she's on her way. We continued to code the patient for 44 minutes, after several rounds of CPR, epi, calcium, and sodium bicarb. Time of death called at 0218.

## 2021-07-28 NOTE — ED Provider Notes (Signed)
[facility] Department of Emergency Medicine   Code Blue CONSULT NOTE  Chief Complaint: Cardiac arrest/unresponsive   Level V Caveat: Unresponsive  History of present illness: I was contacted by the hospital for a CODE BLUE cardiac arrest upstairs and presented to the patient's bedside. Upon arrival patient was admitted to the ICU, was on the floor with ongoing CPR.  ICU contacted [place] for help in obtaining an airway.   ROS: Unable to obtain, Level V caveat  Scheduled Meds:  Chlorhexidine Gluconate Cloth  6 each Topical Q0600   dexamethasone (DECADRON) injection  4 mg Intravenous Q12H   enoxaparin (LOVENOX) injection  0.5 mg/kg Subcutaneous Q24H   EPINEPHrine       EPINEPHrine       feeding supplement (GLUCERNA SHAKE)  237 mL Oral BID BM   furosemide  40 mg Intravenous BID   guaiFENesin-dextromethorphan  5 mL Oral QID   insulin aspart  0-15 Units Subcutaneous TID AC & HS   insulin aspart  7 Units Subcutaneous TID WC   insulin glargine-yfgn  54 Units Subcutaneous Daily   ipratropium-albuterol  3 mL Nebulization Q6H   mometasone-formoterol  2 puff Inhalation BID   mycophenolate  250 mg Oral BID   pantoprazole  40 mg Oral BID   senna-docusate  1 tablet Oral BID   simethicone  80 mg Oral QID   sodium chloride flush  3 mL Intravenous Q12H   sulfamethoxazole-trimethoprim  1 tablet Oral Once per day on Mon Wed Fri   tamsulosin  0.4 mg Oral Daily   Continuous Infusions:  sodium chloride     PRN Meds:.sodium chloride, acetaminophen **OR** acetaminophen, benzonatate, bisacodyl, chlorpheniramine-HYDROcodone, lidocaine, LORazepam, ondansetron **OR** ondansetron (ZOFRAN) IV, polyethylene glycol, polyvinyl alcohol, sodium chloride, sodium chloride flush Past Medical History:  Diagnosis Date   Chronic airway obstruction (HCC)    DDD (degenerative disc disease), lumbar    Dupuytren's disease    Dyslipidemia 04/07/2014   Esophageal reflux 04/20/2014   Hypertension  04/20/2014   Microalbuminuria 04/07/2014   Microalbuminuria    Obesity, unspecified 04/07/2014   Sleep apnea 04/20/2014   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   Past Surgical History:  Procedure Laterality Date   BACK SURGERY  1992, 2005   CLOSED REDUCTION FINGER WITH PERCUTANEOUS PINNING Left 02/12/2020   Procedure: Closed reduction and pinning of left first metacarpal fracture with possible open reduction;  Surgeon: Hessie Knows, MD;  Location: [facility] ORS;  Service: Orthopedics;  Laterality: Left;   COLONOSCOPY WITH PROPOFOL N/A 12/28/2017   Procedure: COLONOSCOPY WITH PROPOFOL;  Surgeon: Manya Silvas, MD;  Location: [facility] ENDOSCOPY;  Service: Endoscopy;  Laterality: N/A;   NASAL SEPTUM SURGERY     RADIOACTIVE SEED IMPLANT N/A 04/12/2021   Procedure: RADIOACTIVE SEED IMPLANT/BRACHYTHERAPY IMPLANT;  Surgeon: Abbie Sons, MD;  Location: [facility] ORS;  Service: Urology;  Laterality: N/A;  49 seeds implanted   VASECTOMY  2005   Social History   Socioeconomic History   Marital status: Married    Spouse name: Not on file   Number of children: Not on file   Years of education: Not on file   Highest education level: Not on file  Occupational History   Not on file  Tobacco Use   Smoking status: Former   Smokeless tobacco: Never  Vaping Use   Vaping Use: Never used  Substance and Sexual Activity   Alcohol use: Not Currently   Drug use: Not Currently   Sexual activity:  Yes    Birth control/protection: None  Other Topics Concern   Not on file  Social History Narrative   Not on file   Social Determinants of Health   Financial Resource Strain: Not on file  Food Insecurity: Not on file  Transportation Needs: Not on file  Physical Activity: Not on file  Stress: Not on file  Social Connections: Not on file  Intimate Partner Violence: Not on file   No Known Allergies  Last set of Vital Signs (not current) Vitals:   28-Jul-2021 0100 07/28/21 0200  BP:  (!) 143/80 (!) 229/118  Pulse: 79 (!) 281  Resp: (!) 31 (!) 43  Temp:    SpO2: 97% (!) 70%      Physical Exam  Gen: unresponsive Cardiovascular: pulseless  Resp: apneic. Breath sounds equal bilaterally with bagging  Abd: nondistended  Neuro: GCS 3, unresponsive to pain  HEENT: No blood in posterior pharynx, gag reflex absent  Neck: No crepitus  Musculoskeletal: No deformity  Skin: warm  Procedures  INTUBATION Performed by: Rudene Re Required items: required blood products, implants, devices, and special equipment available Patient identity confirmed: provided demographic data and hospital-assigned identification number Time out: Immediately prior to procedure a "time out" was called to verify the correct patient, procedure, equipment, support staff and site/side marked as required. Indications: cardiac arrest Intubation method: video laryngoscopy Preoxygenation: BVM Sedatives: none Paralytic: none Tube Size: 7.5 cuffed Post-procedure assessment: chest rise and ETCO2 monitor Breath sounds: equal and absent over the epigastrium Tube secured by Respiratory Therapy Patient tolerated the procedure well with no immediate complications.  CRITICAL CARE Performed by: Rudene Re Total critical care time: 30 min Critical care time was exclusive of separately billable procedures and treating other patients. Critical care was necessary to treat or prevent imminent or life-threatening deterioration. Critical care was time spent personally by me on the following activities: development of treatment plan with patient and/or surrogate as well as nursing, discussions with consultants, evaluation of patient's response to treatment, examination of patient, obtaining history from patient or surrogate, ordering and performing treatments and interventions, ordering and review of laboratory studies, ordering and review of radiographic studies, pulse oximetry and re-evaluation of  patient's condition.  Cardiopulmonary Resuscitation (CPR) Procedure Note  Directed/Performed by: Rudene Re I personally directed ancillary staff and/or performed CPR in an effort to regain return of spontaneous circulation and to maintain cardiac, neuro and systemic perfusion.    Assessment and Plan  Patient admitted for COVID and respiratory failure.  Found by ICU staff pulseless.  Several attempts by ICU provider to obtain an airway were unsuccessful therefore a CODE BLUE was paged to the emergency room for help in obtaining an airway.  Patient was intubated with a 7.5 cuffed ET tube.  I was available and helped with resuscitation and management of patient alongside with the ICU provider.  After 40 minutes of CPR patient was pronounced.    Rudene Re, MD 2021/07/28 [phone]

## 2021-07-28 NOTE — Progress Notes (Signed)
Assisted with bagging and ensuring respiratory equipment was maintained during coding of patient. See code sheet. ACLS practices in place during event.

## 2021-07-28 DEATH — deceased

## 2021-08-01 LAB — BLOOD GAS, ARTERIAL
Acid-Base Excess: 12.6 mmol/L — ABNORMAL HIGH (ref 0.0–2.0)
Bicarbonate: 39.3 mmol/L — ABNORMAL HIGH (ref 20.0–28.0)
Delivery systems: POSITIVE
Expiratory PAP: 10
FIO2: 90
Inspiratory PAP: 18
O2 Saturation: 99.2 %
Patient temperature: 37
pCO2 arterial: 62 mmHg — ABNORMAL HIGH (ref 32.0–48.0)
pH, Arterial: 7.41 (ref 7.350–7.450)
pO2, Arterial: 142 mmHg — ABNORMAL HIGH (ref 83.0–108.0)

## 2021-08-02 ENCOUNTER — Telehealth: Payer: Self-pay | Admitting: *Deleted

## 2021-08-02 NOTE — Telephone Encounter (Signed)
Called to inform Dr Baruch Gouty that patient expired Aug 03, 2021

## 2021-08-24 ENCOUNTER — Other Ambulatory Visit: Payer: BC Managed Care – PPO

## 2021-08-31 ENCOUNTER — Ambulatory Visit: Payer: BC Managed Care – PPO | Admitting: Radiation Oncology

## 2021-09-26 ENCOUNTER — Other Ambulatory Visit: Payer: Self-pay

## 2021-09-28 ENCOUNTER — Ambulatory Visit: Payer: Self-pay | Admitting: Urology

## 2022-07-21 IMAGING — DX DG CHEST 1V PORT
1 series · 1 of 1 positions shown · non-contrast
Comparison: June 16, 2021.

CLINICAL DATA: Shortness of breath, diagnosed previously with
pneumonia and bronchitis superimposed on pulmonary fibrosis by
report.

EXAM:
PORTABLE CHEST 1 VIEW

[chest ap]
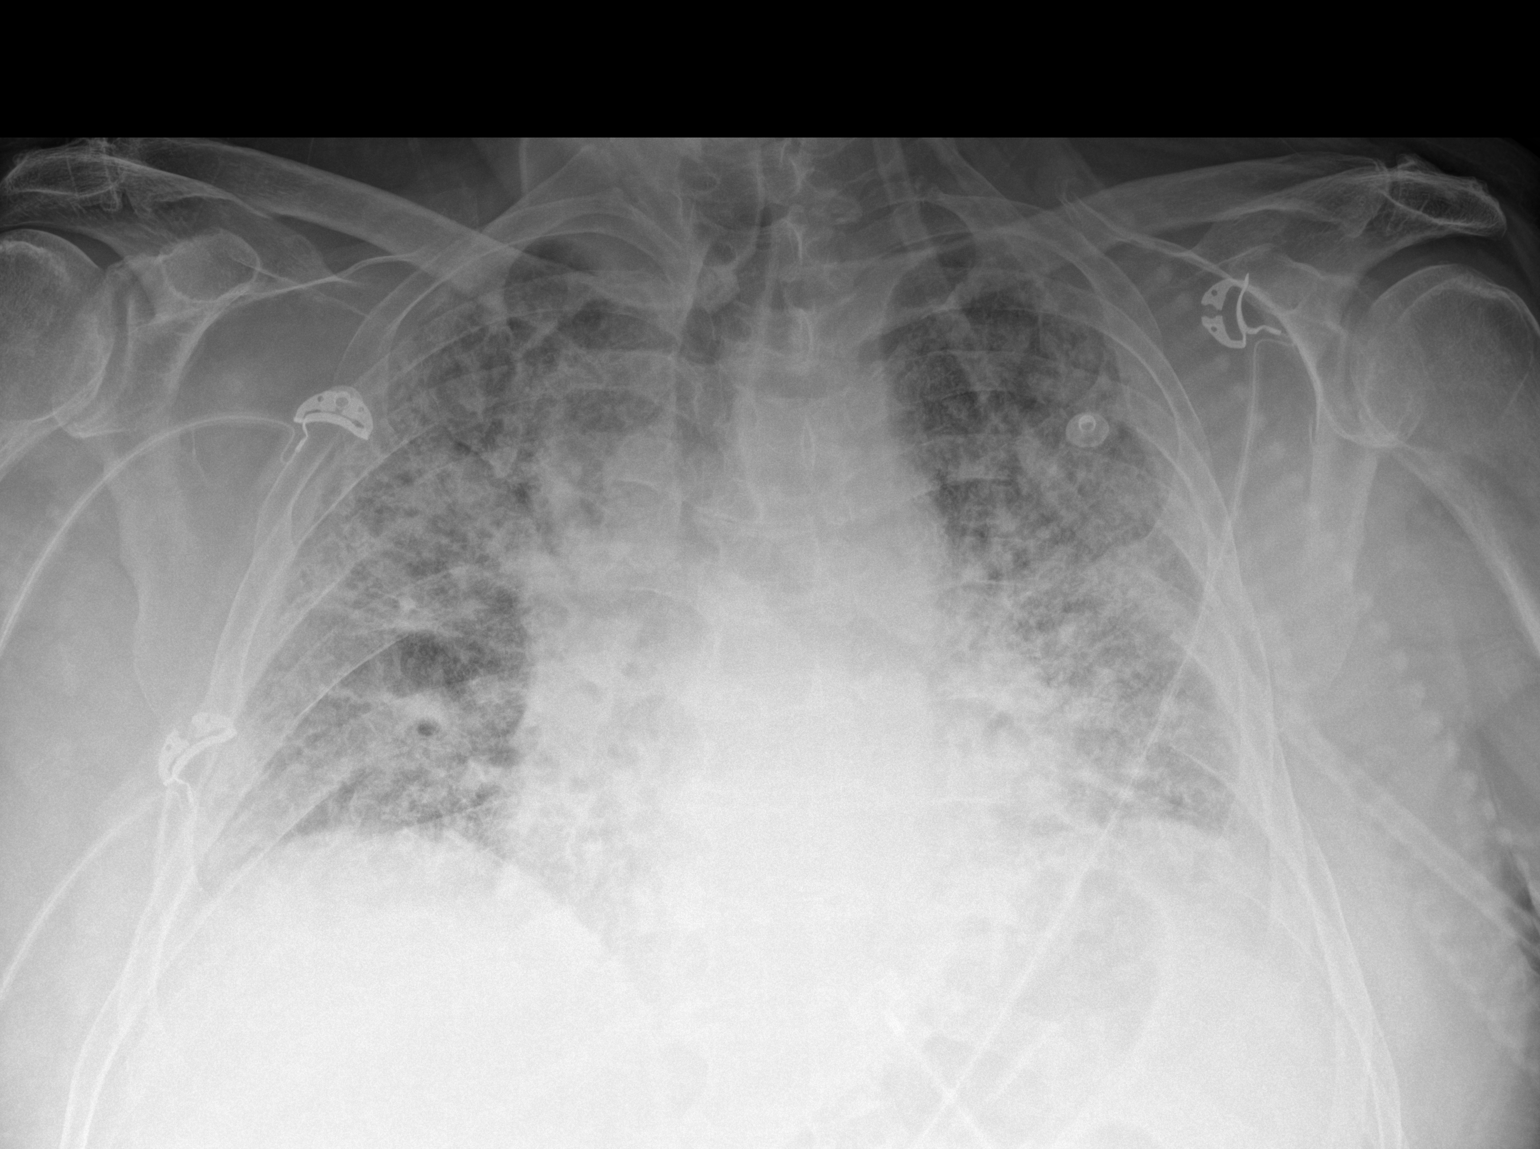

[1 of 1 positions shown; findings below may reference images not displayed]

FINDINGS: EKG leads project over the chest. Study limited by body habitus and
portable technique. Stable cardiomediastinal contours and hilar
structures, obscured by increasing nurse tissue old in alveolar
opacities in the LEFT and RIGHT chest when compared to the previous
study.

No signs of lobar consolidative process or evidence of gross pleural
effusion or pneumothorax.

On limited assessment there is no acute skeletal process.
IMPRESSION: Increasing interstitial and alveolar opacities, suspicious for
superimposed pneumonia or edema on background interstitial lung
disease.

## 2022-07-24 IMAGING — DX DG CHEST 1V PORT
1 series · 1 of 1 positions shown · non-contrast
Comparison: Radiographs 07/21/2021 and 06/16/2021. CT 07/21/2021,
05/30/2021 and 10/20/2016.

CLINICAL DATA: Worsening shortness of breath with cough for 4 days.
History of hypertension and interstitial lung disease/pulmonary
fibrosis. On home oxygen.

EXAM:
PORTABLE CHEST 1 VIEW

[chest ap]
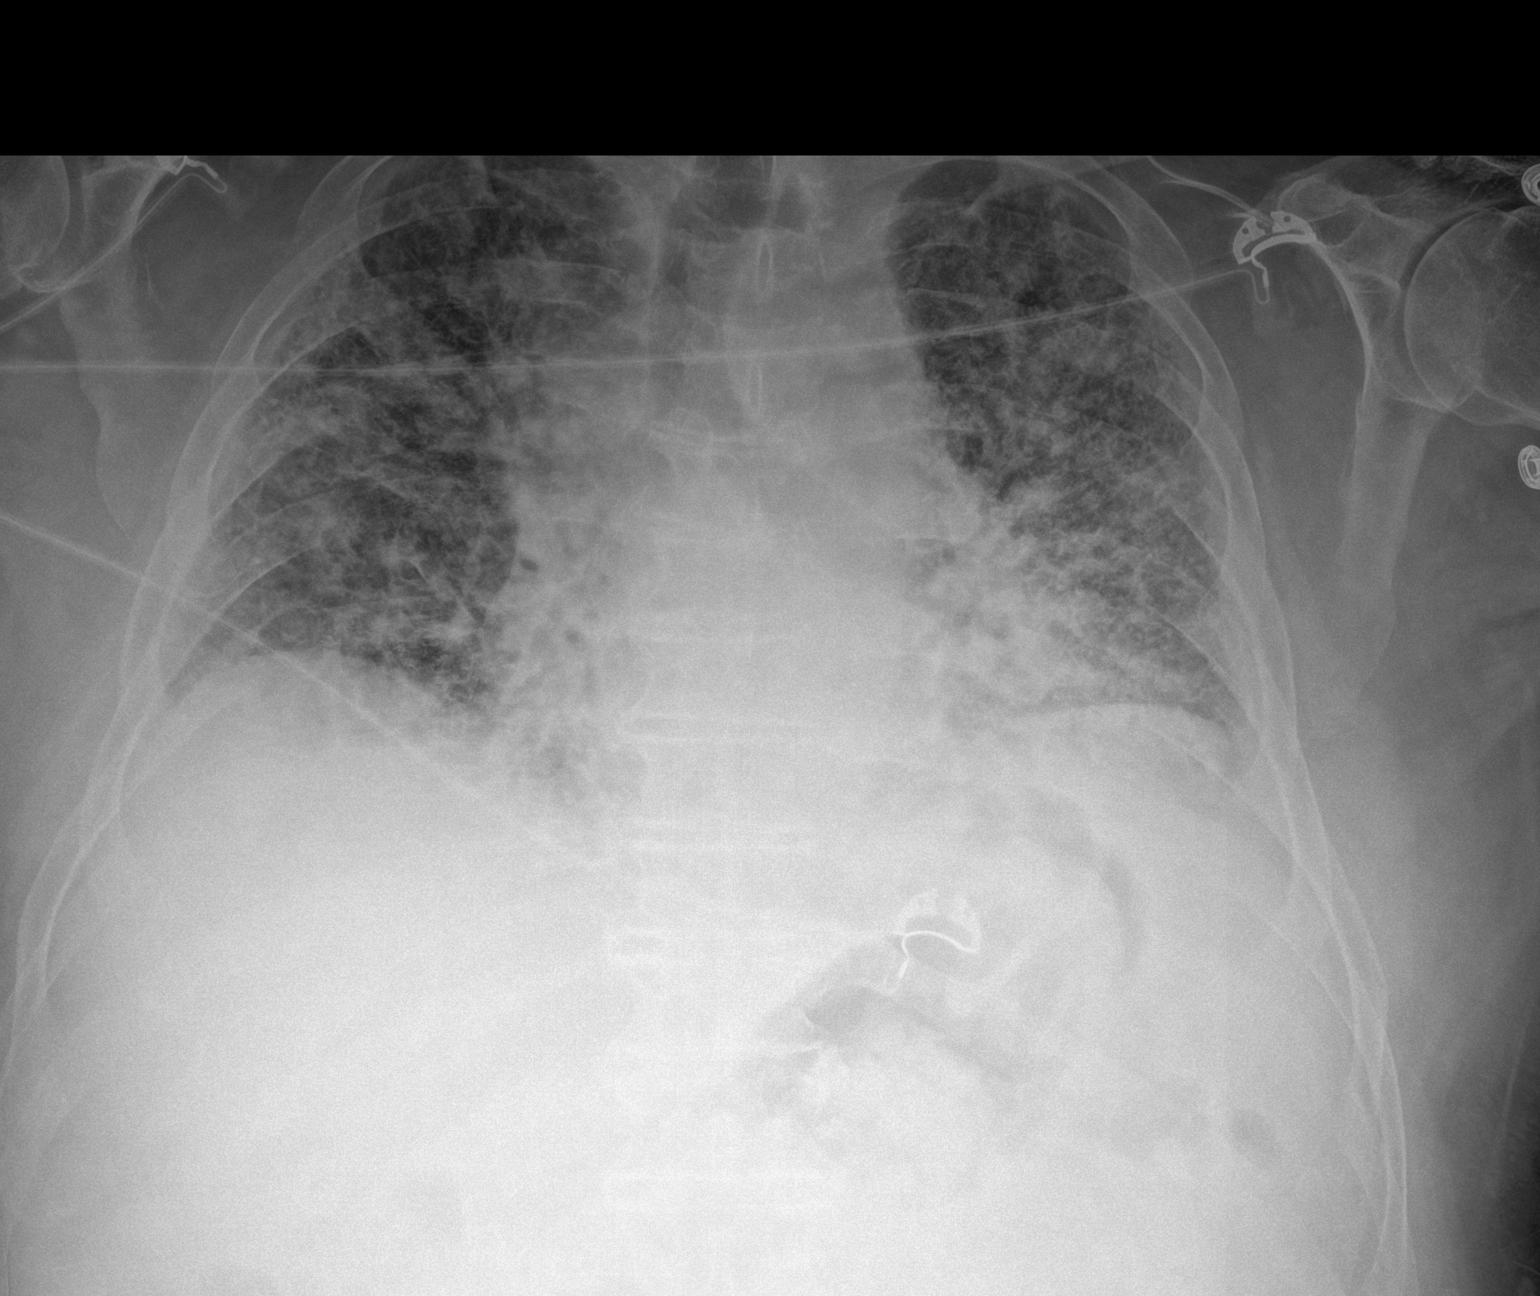

[1 of 1 positions shown; findings below may reference images not displayed]

FINDINGS: 6456 hours. The heart size and mediastinal contours are stable.
There are persistent low lung volumes. Extensive interstitial and
ground-glass opacities are again demonstrated in both lungs,
unchanged from the most recent prior studies. These have progressed
compared with prior studies done 2 months ago and remains suspicious
for acute superimposed process. No consolidation, pneumothorax or
pleural effusion.
IMPRESSION: 1. No change from recent prior studies.
2. Underlying chronic interstitial lung disease/fibrosis with
recently increased interstitial and ground-glass opacities
compatible with superimposed inflammation or edema.

## 2022-07-28 LAB — HISTOPLASMA GAL'MANNAN AG SER: Histoplasma Gal'mannan Ag Ser: <0.5 (ref <0.5)
